# Patient Record
Sex: Female | Born: 1960 | Race: Black or African American | Hispanic: No | Marital: Married | Smoking: Former smoker
Health system: Southern US, Community
[De-identification: ages and names within clinical notes are randomized; demographics above are authoritative.]

## PROBLEM LIST (undated history)

## (undated) DIAGNOSIS — N200 Calculus of kidney: Secondary | ICD-10-CM

## (undated) DIAGNOSIS — E785 Hyperlipidemia, unspecified: Secondary | ICD-10-CM

## (undated) DIAGNOSIS — R011 Cardiac murmur, unspecified: Secondary | ICD-10-CM

## (undated) DIAGNOSIS — J189 Pneumonia, unspecified organism: Secondary | ICD-10-CM

## (undated) DIAGNOSIS — I10 Essential (primary) hypertension: Secondary | ICD-10-CM

## (undated) DIAGNOSIS — I499 Cardiac arrhythmia, unspecified: Secondary | ICD-10-CM

## (undated) HISTORY — PX: COLONOSCOPY: SHX174

## (undated) HISTORY — DX: Cardiac murmur, unspecified: R01.1

## (undated) HISTORY — PX: FOOT SURGERY: SHX648

## (undated) HISTORY — PX: BACK SURGERY: SHX140

## (undated) HISTORY — DX: Hyperlipidemia, unspecified: E78.5

## (undated) HISTORY — DX: Essential (primary) hypertension: I10

---

## 1998-07-07 ENCOUNTER — Emergency Department (HOSPITAL_COMMUNITY): Admission: EM | Admit: 1998-07-07 | Discharge: 1998-07-07 | Payer: Self-pay | Admitting: Emergency Medicine

## 1999-09-26 HISTORY — PX: ABDOMINAL HYSTERECTOMY: SHX81

## 2000-06-27 ENCOUNTER — Emergency Department (HOSPITAL_COMMUNITY): Admission: EM | Admit: 2000-06-27 | Discharge: 2000-06-27 | Payer: Self-pay | Admitting: Emergency Medicine

## 2001-02-10 ENCOUNTER — Encounter: Payer: Self-pay | Admitting: Emergency Medicine

## 2001-02-10 ENCOUNTER — Emergency Department (HOSPITAL_COMMUNITY): Admission: EM | Admit: 2001-02-10 | Discharge: 2001-02-10 | Payer: Self-pay | Admitting: Emergency Medicine

## 2001-02-15 ENCOUNTER — Other Ambulatory Visit: Admission: RE | Admit: 2001-02-15 | Discharge: 2001-02-15 | Payer: Self-pay | Admitting: Obstetrics and Gynecology

## 2001-03-13 ENCOUNTER — Encounter (INDEPENDENT_AMBULATORY_CARE_PROVIDER_SITE_OTHER): Payer: Self-pay | Admitting: Specialist

## 2001-03-13 ENCOUNTER — Other Ambulatory Visit: Admission: RE | Admit: 2001-03-13 | Discharge: 2001-03-13 | Payer: Self-pay | Admitting: Obstetrics and Gynecology

## 2001-05-07 ENCOUNTER — Encounter (INDEPENDENT_AMBULATORY_CARE_PROVIDER_SITE_OTHER): Payer: Self-pay

## 2001-05-07 ENCOUNTER — Observation Stay (HOSPITAL_COMMUNITY): Admission: RE | Admit: 2001-05-07 | Discharge: 2001-05-08 | Payer: Self-pay | Admitting: Obstetrics and Gynecology

## 2002-10-22 ENCOUNTER — Emergency Department (HOSPITAL_COMMUNITY): Admission: EM | Admit: 2002-10-22 | Discharge: 2002-10-22 | Payer: Self-pay | Admitting: Emergency Medicine

## 2002-10-23 ENCOUNTER — Ambulatory Visit (HOSPITAL_COMMUNITY): Admission: RE | Admit: 2002-10-23 | Discharge: 2002-10-23 | Payer: Self-pay | Admitting: Surgery

## 2002-10-23 ENCOUNTER — Encounter: Payer: Self-pay | Admitting: Surgery

## 2003-04-08 ENCOUNTER — Ambulatory Visit (HOSPITAL_COMMUNITY): Admission: RE | Admit: 2003-04-08 | Discharge: 2003-04-08 | Payer: Self-pay | Admitting: Obstetrics and Gynecology

## 2003-04-08 ENCOUNTER — Encounter: Payer: Self-pay | Admitting: Obstetrics and Gynecology

## 2005-01-20 ENCOUNTER — Emergency Department (HOSPITAL_COMMUNITY): Admission: EM | Admit: 2005-01-20 | Discharge: 2005-01-20 | Payer: Self-pay | Admitting: Emergency Medicine

## 2005-01-25 ENCOUNTER — Emergency Department (HOSPITAL_COMMUNITY): Admission: EM | Admit: 2005-01-25 | Discharge: 2005-01-25 | Payer: Self-pay | Admitting: Emergency Medicine

## 2005-02-13 ENCOUNTER — Ambulatory Visit: Payer: Self-pay | Admitting: Internal Medicine

## 2005-02-27 ENCOUNTER — Ambulatory Visit: Payer: Self-pay | Admitting: Internal Medicine

## 2005-03-10 ENCOUNTER — Ambulatory Visit: Payer: Self-pay | Admitting: Internal Medicine

## 2005-03-16 ENCOUNTER — Ambulatory Visit: Payer: Self-pay | Admitting: Internal Medicine

## 2005-04-18 ENCOUNTER — Ambulatory Visit: Payer: Self-pay | Admitting: Internal Medicine

## 2005-05-09 ENCOUNTER — Ambulatory Visit: Payer: Self-pay | Admitting: Internal Medicine

## 2005-05-11 ENCOUNTER — Ambulatory Visit: Payer: Self-pay | Admitting: Internal Medicine

## 2005-05-16 ENCOUNTER — Ambulatory Visit: Payer: Self-pay | Admitting: Internal Medicine

## 2005-05-17 ENCOUNTER — Ambulatory Visit (HOSPITAL_COMMUNITY): Admission: RE | Admit: 2005-05-17 | Discharge: 2005-05-17 | Payer: Self-pay | Admitting: Internal Medicine

## 2005-05-30 ENCOUNTER — Ambulatory Visit: Payer: Self-pay | Admitting: Internal Medicine

## 2005-05-31 ENCOUNTER — Ambulatory Visit (HOSPITAL_COMMUNITY): Admission: RE | Admit: 2005-05-31 | Discharge: 2005-05-31 | Payer: Self-pay | Admitting: Internal Medicine

## 2005-07-10 ENCOUNTER — Emergency Department (HOSPITAL_COMMUNITY): Admission: EM | Admit: 2005-07-10 | Discharge: 2005-07-10 | Payer: Self-pay | Admitting: Emergency Medicine

## 2005-09-26 ENCOUNTER — Ambulatory Visit: Payer: Self-pay | Admitting: Internal Medicine

## 2005-09-28 ENCOUNTER — Ambulatory Visit (HOSPITAL_COMMUNITY): Admission: RE | Admit: 2005-09-28 | Discharge: 2005-09-28 | Payer: Self-pay | Admitting: Internal Medicine

## 2005-10-03 ENCOUNTER — Ambulatory Visit: Payer: Self-pay | Admitting: Hospitalist

## 2005-11-07 ENCOUNTER — Ambulatory Visit: Payer: Self-pay | Admitting: Internal Medicine

## 2005-11-12 ENCOUNTER — Emergency Department (HOSPITAL_COMMUNITY): Admission: EM | Admit: 2005-11-12 | Discharge: 2005-11-12 | Payer: Self-pay | Admitting: Emergency Medicine

## 2005-11-14 ENCOUNTER — Ambulatory Visit: Payer: Self-pay | Admitting: Internal Medicine

## 2005-11-15 ENCOUNTER — Ambulatory Visit: Payer: Self-pay | Admitting: Internal Medicine

## 2005-12-07 ENCOUNTER — Ambulatory Visit: Payer: Self-pay | Admitting: Internal Medicine

## 2005-12-14 ENCOUNTER — Ambulatory Visit (HOSPITAL_COMMUNITY): Admission: RE | Admit: 2005-12-14 | Discharge: 2005-12-14 | Payer: Self-pay | Admitting: Hospitalist

## 2005-12-14 ENCOUNTER — Ambulatory Visit: Payer: Self-pay | Admitting: Internal Medicine

## 2006-01-04 ENCOUNTER — Ambulatory Visit: Payer: Self-pay

## 2006-01-05 ENCOUNTER — Ambulatory Visit: Payer: Self-pay | Admitting: Internal Medicine

## 2006-01-05 ENCOUNTER — Inpatient Hospital Stay (HOSPITAL_COMMUNITY): Admission: EM | Admit: 2006-01-05 | Discharge: 2006-01-08 | Payer: Self-pay | Admitting: Internal Medicine

## 2006-01-05 ENCOUNTER — Ambulatory Visit: Payer: Self-pay | Admitting: Cardiology

## 2006-01-05 ENCOUNTER — Ambulatory Visit (HOSPITAL_COMMUNITY): Admission: RE | Admit: 2006-01-05 | Discharge: 2006-01-05 | Payer: Self-pay | Admitting: Internal Medicine

## 2006-01-08 ENCOUNTER — Encounter: Payer: Self-pay | Admitting: Cardiology

## 2006-01-09 ENCOUNTER — Ambulatory Visit: Payer: Self-pay

## 2006-01-16 ENCOUNTER — Ambulatory Visit: Payer: Self-pay | Admitting: Internal Medicine

## 2006-01-25 ENCOUNTER — Ambulatory Visit (HOSPITAL_COMMUNITY): Admission: RE | Admit: 2006-01-25 | Discharge: 2006-01-25 | Payer: Self-pay | Admitting: Internal Medicine

## 2006-02-14 ENCOUNTER — Emergency Department (HOSPITAL_COMMUNITY): Admission: EM | Admit: 2006-02-14 | Discharge: 2006-02-14 | Payer: Self-pay | Admitting: Family Medicine

## 2006-03-06 ENCOUNTER — Ambulatory Visit: Payer: Self-pay | Admitting: Internal Medicine

## 2006-03-12 ENCOUNTER — Ambulatory Visit (HOSPITAL_COMMUNITY): Admission: RE | Admit: 2006-03-12 | Discharge: 2006-03-12 | Payer: Self-pay | Admitting: Internal Medicine

## 2006-03-20 ENCOUNTER — Ambulatory Visit: Payer: Self-pay | Admitting: Internal Medicine

## 2006-04-18 ENCOUNTER — Ambulatory Visit: Payer: Self-pay | Admitting: Gastroenterology

## 2006-05-09 ENCOUNTER — Ambulatory Visit: Payer: Self-pay | Admitting: Internal Medicine

## 2006-05-16 ENCOUNTER — Ambulatory Visit: Payer: Self-pay | Admitting: Gastroenterology

## 2006-05-21 ENCOUNTER — Ambulatory Visit: Payer: Self-pay | Admitting: Gastroenterology

## 2006-06-11 ENCOUNTER — Ambulatory Visit: Payer: Self-pay | Admitting: Gastroenterology

## 2006-06-11 ENCOUNTER — Encounter (INDEPENDENT_AMBULATORY_CARE_PROVIDER_SITE_OTHER): Payer: Self-pay | Admitting: Ophthalmology

## 2006-06-27 ENCOUNTER — Emergency Department (HOSPITAL_COMMUNITY): Admission: EM | Admit: 2006-06-27 | Discharge: 2006-06-28 | Payer: Self-pay | Admitting: Emergency Medicine

## 2006-06-29 ENCOUNTER — Ambulatory Visit: Payer: Self-pay | Admitting: Internal Medicine

## 2006-08-03 ENCOUNTER — Ambulatory Visit: Payer: Self-pay | Admitting: Internal Medicine

## 2006-10-31 ENCOUNTER — Emergency Department (HOSPITAL_COMMUNITY): Admission: EM | Admit: 2006-10-31 | Discharge: 2006-10-31 | Payer: Self-pay | Admitting: Family Medicine

## 2006-11-08 ENCOUNTER — Telehealth (INDEPENDENT_AMBULATORY_CARE_PROVIDER_SITE_OTHER): Payer: Self-pay | Admitting: *Deleted

## 2006-11-26 ENCOUNTER — Telehealth: Payer: Self-pay | Admitting: *Deleted

## 2006-11-27 ENCOUNTER — Ambulatory Visit: Payer: Self-pay | Admitting: Hospitalist

## 2006-11-27 ENCOUNTER — Encounter (INDEPENDENT_AMBULATORY_CARE_PROVIDER_SITE_OTHER): Payer: Self-pay | Admitting: Ophthalmology

## 2006-11-27 ENCOUNTER — Ambulatory Visit (HOSPITAL_COMMUNITY): Admission: RE | Admit: 2006-11-27 | Discharge: 2006-11-27 | Payer: Self-pay | Admitting: Hospitalist

## 2006-11-27 DIAGNOSIS — R059 Cough, unspecified: Secondary | ICD-10-CM | POA: Insufficient documentation

## 2006-11-27 DIAGNOSIS — R05 Cough: Secondary | ICD-10-CM

## 2006-11-27 DIAGNOSIS — I10 Essential (primary) hypertension: Secondary | ICD-10-CM | POA: Insufficient documentation

## 2006-11-27 DIAGNOSIS — E785 Hyperlipidemia, unspecified: Secondary | ICD-10-CM | POA: Insufficient documentation

## 2006-11-27 LAB — CONVERTED CEMR LAB
Blood Glucose, Fingerstick: 109
Hgb A1c MFr Bld: 6.6 %

## 2006-12-14 ENCOUNTER — Telehealth (INDEPENDENT_AMBULATORY_CARE_PROVIDER_SITE_OTHER): Payer: Self-pay | Admitting: Ophthalmology

## 2006-12-17 ENCOUNTER — Telehealth (INDEPENDENT_AMBULATORY_CARE_PROVIDER_SITE_OTHER): Payer: Self-pay | Admitting: *Deleted

## 2006-12-27 ENCOUNTER — Telehealth: Payer: Self-pay | Admitting: *Deleted

## 2007-01-04 ENCOUNTER — Telehealth: Payer: Self-pay | Admitting: *Deleted

## 2007-01-10 ENCOUNTER — Encounter (INDEPENDENT_AMBULATORY_CARE_PROVIDER_SITE_OTHER): Payer: Self-pay | Admitting: Ophthalmology

## 2007-01-11 ENCOUNTER — Ambulatory Visit: Payer: Self-pay | Admitting: Internal Medicine

## 2007-01-11 ENCOUNTER — Encounter (INDEPENDENT_AMBULATORY_CARE_PROVIDER_SITE_OTHER): Payer: Self-pay | Admitting: *Deleted

## 2007-01-11 LAB — CONVERTED CEMR LAB
BUN: 12 mg/dL (ref 6–23)
CO2: 24 meq/L (ref 19–32)
Calcium: 8.8 mg/dL (ref 8.4–10.5)
Chloride: 103 meq/L (ref 96–112)
Creatinine, Ser: 0.85 mg/dL (ref 0.40–1.20)
Glucose, Bld: 253 mg/dL — ABNORMAL HIGH (ref 70–99)
Potassium: 4.4 meq/L (ref 3.5–5.3)
Sodium: 136 meq/L (ref 135–145)

## 2007-02-05 ENCOUNTER — Ambulatory Visit: Payer: Self-pay | Admitting: Internal Medicine

## 2007-02-26 ENCOUNTER — Telehealth (INDEPENDENT_AMBULATORY_CARE_PROVIDER_SITE_OTHER): Payer: Self-pay | Admitting: *Deleted

## 2007-02-26 ENCOUNTER — Ambulatory Visit: Payer: Self-pay | Admitting: Internal Medicine

## 2007-02-26 ENCOUNTER — Encounter (INDEPENDENT_AMBULATORY_CARE_PROVIDER_SITE_OTHER): Payer: Self-pay | Admitting: *Deleted

## 2007-02-26 ENCOUNTER — Telehealth: Payer: Self-pay | Admitting: *Deleted

## 2007-02-26 LAB — CONVERTED CEMR LAB
BUN: 10 mg/dL (ref 6–23)
Blood Glucose, Fingerstick: 380
CO2: 26 meq/L (ref 19–32)
Calcium: 10.1 mg/dL (ref 8.4–10.5)
Chloride: 99 meq/L (ref 96–112)
Creatinine, Ser: 0.9 mg/dL (ref 0.40–1.20)
Glucose, Bld: 393 mg/dL — ABNORMAL HIGH (ref 70–99)
Hgb A1c MFr Bld: 8 %
Potassium: 4.1 meq/L (ref 3.5–5.3)
Sodium: 137 meq/L (ref 135–145)

## 2007-03-07 ENCOUNTER — Ambulatory Visit: Payer: Self-pay | Admitting: Internal Medicine

## 2007-03-07 ENCOUNTER — Encounter (INDEPENDENT_AMBULATORY_CARE_PROVIDER_SITE_OTHER): Payer: Self-pay | Admitting: Internal Medicine

## 2007-03-07 LAB — CONVERTED CEMR LAB: Blood Glucose, Fingerstick: 229

## 2007-03-13 ENCOUNTER — Ambulatory Visit (HOSPITAL_COMMUNITY): Admission: RE | Admit: 2007-03-13 | Discharge: 2007-03-13 | Payer: Self-pay | Admitting: Internal Medicine

## 2007-03-18 ENCOUNTER — Emergency Department (HOSPITAL_COMMUNITY): Admission: EM | Admit: 2007-03-18 | Discharge: 2007-03-18 | Payer: Self-pay | Admitting: Emergency Medicine

## 2007-03-27 ENCOUNTER — Telehealth (INDEPENDENT_AMBULATORY_CARE_PROVIDER_SITE_OTHER): Payer: Self-pay | Admitting: *Deleted

## 2007-04-29 ENCOUNTER — Telehealth (INDEPENDENT_AMBULATORY_CARE_PROVIDER_SITE_OTHER): Payer: Self-pay | Admitting: Internal Medicine

## 2007-05-06 ENCOUNTER — Telehealth (INDEPENDENT_AMBULATORY_CARE_PROVIDER_SITE_OTHER): Payer: Self-pay | Admitting: Internal Medicine

## 2007-05-07 ENCOUNTER — Telehealth: Payer: Self-pay | Admitting: *Deleted

## 2007-05-14 ENCOUNTER — Ambulatory Visit: Payer: Self-pay | Admitting: Internal Medicine

## 2007-05-14 LAB — CONVERTED CEMR LAB
Blood Glucose, Fingerstick: 135
Hgb A1c MFr Bld: 7.7 %

## 2007-06-03 ENCOUNTER — Telehealth: Payer: Self-pay | Admitting: *Deleted

## 2007-07-22 ENCOUNTER — Emergency Department (HOSPITAL_COMMUNITY): Admission: EM | Admit: 2007-07-22 | Discharge: 2007-07-22 | Payer: Self-pay | Admitting: Emergency Medicine

## 2007-08-15 ENCOUNTER — Ambulatory Visit: Payer: Self-pay | Admitting: Internal Medicine

## 2007-08-15 ENCOUNTER — Encounter (INDEPENDENT_AMBULATORY_CARE_PROVIDER_SITE_OTHER): Payer: Self-pay | Admitting: Internal Medicine

## 2007-08-15 DIAGNOSIS — R42 Dizziness and giddiness: Secondary | ICD-10-CM | POA: Insufficient documentation

## 2007-08-15 LAB — CONVERTED CEMR LAB
Bilirubin Urine: NEGATIVE
Glucose, Urine, Semiquant: NEGATIVE
Ketones, urine, test strip: NEGATIVE
Nitrite: NEGATIVE
Protein, U semiquant: 30
Specific Gravity, Urine: 1.02
Urobilinogen, UA: 0.2
WBC Urine, dipstick: NEGATIVE
pH: 6.5

## 2007-08-16 LAB — CONVERTED CEMR LAB
Amphetamine Screen, Ur: NEGATIVE
Barbiturate Quant, Ur: NEGATIVE
Benzodiazepines.: NEGATIVE
Cocaine Metabolites: NEGATIVE
Creatinine, Urine: 165 mg/dL
Creatinine,U: 165.3 mg/dL
Marijuana Metabolite: NEGATIVE
Methadone: NEGATIVE
Microalb Creat Ratio: 56.1 mg/g — ABNORMAL HIGH (ref 0.0–30.0)
Microalb, Ur: 9.25 mg/dL — ABNORMAL HIGH (ref 0.00–1.89)
Opiates: NEGATIVE
Phencyclidine (PCP): NEGATIVE
Propoxyphene: NEGATIVE

## 2007-08-20 ENCOUNTER — Telehealth: Payer: Self-pay | Admitting: *Deleted

## 2007-09-09 ENCOUNTER — Telehealth (INDEPENDENT_AMBULATORY_CARE_PROVIDER_SITE_OTHER): Payer: Self-pay | Admitting: *Deleted

## 2007-10-03 ENCOUNTER — Ambulatory Visit: Payer: Self-pay | Admitting: Internal Medicine

## 2007-10-03 ENCOUNTER — Encounter (INDEPENDENT_AMBULATORY_CARE_PROVIDER_SITE_OTHER): Payer: Self-pay | Admitting: Internal Medicine

## 2007-10-04 LAB — CONVERTED CEMR LAB
ALT: 25 units/L (ref 0–35)
AST: 25 units/L (ref 0–37)
Albumin: 4.4 g/dL (ref 3.5–5.2)
Alkaline Phosphatase: 99 units/L (ref 39–117)
BUN: 11 mg/dL (ref 6–23)
CO2: 24 meq/L (ref 19–32)
Calcium: 9.5 mg/dL (ref 8.4–10.5)
Chloride: 105 meq/L (ref 96–112)
Cholesterol: 135 mg/dL (ref 0–200)
Creatinine, Ser: 0.81 mg/dL (ref 0.40–1.20)
Glucose, Bld: 133 mg/dL — ABNORMAL HIGH (ref 70–99)
HDL: 38 mg/dL — ABNORMAL LOW (ref >39)
LDL Cholesterol: 75 mg/dL (ref 0–99)
Potassium: 3.9 meq/L (ref 3.5–5.3)
Sodium: 141 meq/L (ref 135–145)
Total Bilirubin: 0.5 mg/dL (ref 0.3–1.2)
Total CHOL/HDL Ratio: 3.6
Total Protein: 7.7 g/dL (ref 6.0–8.3)
Triglycerides: 111 mg/dL (ref <150)
VLDL: 22 mg/dL (ref 0–40)

## 2007-10-10 ENCOUNTER — Encounter (INDEPENDENT_AMBULATORY_CARE_PROVIDER_SITE_OTHER): Payer: Self-pay | Admitting: Internal Medicine

## 2007-10-11 ENCOUNTER — Encounter (INDEPENDENT_AMBULATORY_CARE_PROVIDER_SITE_OTHER): Payer: Self-pay | Admitting: Internal Medicine

## 2007-11-20 ENCOUNTER — Ambulatory Visit: Payer: Self-pay | Admitting: Internal Medicine

## 2007-11-20 ENCOUNTER — Encounter (INDEPENDENT_AMBULATORY_CARE_PROVIDER_SITE_OTHER): Payer: Self-pay | Admitting: Infectious Diseases

## 2007-11-20 DIAGNOSIS — R609 Edema, unspecified: Secondary | ICD-10-CM | POA: Insufficient documentation

## 2007-11-20 LAB — CONVERTED CEMR LAB
ALT: 36 units/L — ABNORMAL HIGH (ref 0–35)
AST: 30 units/L (ref 0–37)
Albumin: 4.3 g/dL (ref 3.5–5.2)
Alkaline Phosphatase: 97 units/L (ref 39–117)
BUN: 9 mg/dL (ref 6–23)
Blood Glucose, Fingerstick: 315
CO2: 25 meq/L (ref 19–32)
Calcium: 9.4 mg/dL (ref 8.4–10.5)
Chloride: 102 meq/L (ref 96–112)
Creatinine, Ser: 0.73 mg/dL (ref 0.40–1.20)
Glucose, Bld: 257 mg/dL — ABNORMAL HIGH (ref 70–99)
Hgb A1c MFr Bld: 7.6 %
Potassium: 4 meq/L (ref 3.5–5.3)
Sodium: 137 meq/L (ref 135–145)
TSH: 2.003 microintl units/mL (ref 0.350–5.50)
Total Bilirubin: 0.4 mg/dL (ref 0.3–1.2)
Total Protein: 7.6 g/dL (ref 6.0–8.3)

## 2007-11-22 ENCOUNTER — Telehealth: Payer: Self-pay | Admitting: *Deleted

## 2007-11-22 LAB — CONVERTED CEMR LAB
ALT: 44 units/L — ABNORMAL HIGH (ref 0–35)
AST: 36 units/L (ref 0–37)
Albumin: 3.8 g/dL (ref 3.5–5.2)
Alkaline Phosphatase: 96 units/L (ref 39–117)
BUN: 8 mg/dL (ref 6–23)
Bilirubin Urine: NEGATIVE
CO2: 28 meq/L (ref 19–32)
Calcium: 9.4 mg/dL (ref 8.4–10.5)
Chloride: 102 meq/L (ref 96–112)
Creatinine, Ser: 0.8 mg/dL (ref 0.40–1.20)
Glucose, Bld: 265 mg/dL — ABNORMAL HIGH (ref 70–99)
Ketones, ur: NEGATIVE mg/dL
Leukocytes, UA: NEGATIVE
Nitrite: NEGATIVE
Potassium: 3.9 meq/L (ref 3.5–5.3)
Pro B Natriuretic peptide (BNP): <30 pg/mL (ref 0.0–100.0)
Protein, ur: 30 mg/dL — AB
Sodium: 137 meq/L (ref 135–145)
Specific Gravity, Urine: 1.027 (ref 1.005–1.03)
Total Bilirubin: 0.6 mg/dL (ref 0.3–1.2)
Total Protein: 7.4 g/dL (ref 6.0–8.3)
Urine Glucose: >1000 mg/dL — AB
Urobilinogen, UA: 0.2 (ref 0.0–1.0)
pH: 5.5 (ref 5.0–8.0)

## 2007-12-05 ENCOUNTER — Telehealth (INDEPENDENT_AMBULATORY_CARE_PROVIDER_SITE_OTHER): Payer: Self-pay | Admitting: Internal Medicine

## 2007-12-09 ENCOUNTER — Telehealth (INDEPENDENT_AMBULATORY_CARE_PROVIDER_SITE_OTHER): Payer: Self-pay | Admitting: *Deleted

## 2007-12-11 ENCOUNTER — Ambulatory Visit: Payer: Self-pay | Admitting: Hospitalist

## 2007-12-11 ENCOUNTER — Encounter (INDEPENDENT_AMBULATORY_CARE_PROVIDER_SITE_OTHER): Payer: Self-pay | Admitting: Internal Medicine

## 2007-12-11 LAB — CONVERTED CEMR LAB
BUN: 7 mg/dL (ref 6–23)
Bilirubin Urine: NEGATIVE
Bilirubin Urine: NEGATIVE
Blood Glucose, Fingerstick: 438
Blood Glucose, Home Monitor: 1 mg/dL
CO2: 24 meq/L (ref 19–32)
Calcium: 9.5 mg/dL (ref 8.4–10.5)
Chloride: 100 meq/L (ref 96–112)
Creatinine, Ser: 0.79 mg/dL (ref 0.40–1.20)
Glucose, Bld: 404 mg/dL — ABNORMAL HIGH (ref 70–99)
Glucose, Urine, Semiquant: >=1000
Hemoglobin, Urine: NEGATIVE
Ketones, ur: NEGATIVE mg/dL
Ketones, urine, test strip: NEGATIVE
Leukocytes, UA: NEGATIVE
Nitrite: NEGATIVE
Nitrite: NEGATIVE
Potassium: 3.8 meq/L (ref 3.5–5.3)
Protein, ur: NEGATIVE mg/dL
Sodium: 133 meq/L — ABNORMAL LOW (ref 135–145)
Specific Gravity, Urine: 1.01
Specific Gravity, Urine: 1.034 — ABNORMAL HIGH (ref 1.005–1.03)
Urine Glucose: >1000 mg/dL — AB
Urobilinogen, UA: 0.2
Urobilinogen, UA: 0.2 (ref 0.0–1.0)
WBC Urine, dipstick: NEGATIVE
pH: 5
pH: 5.5 (ref 5.0–8.0)

## 2007-12-27 ENCOUNTER — Ambulatory Visit: Payer: Self-pay | Admitting: Internal Medicine

## 2007-12-27 ENCOUNTER — Encounter (INDEPENDENT_AMBULATORY_CARE_PROVIDER_SITE_OTHER): Payer: Self-pay | Admitting: Internal Medicine

## 2007-12-27 LAB — CONVERTED CEMR LAB: Blood Glucose, Fingerstick: 207

## 2007-12-30 LAB — CONVERTED CEMR LAB
Creatinine, Urine: 115.7 mg/dL
Microalb Creat Ratio: 205.7 mg/g — ABNORMAL HIGH (ref 0.0–30.0)
Microalb, Ur: 23.8 mg/dL — ABNORMAL HIGH (ref 0.00–1.89)

## 2008-01-19 ENCOUNTER — Emergency Department (HOSPITAL_COMMUNITY): Admission: EM | Admit: 2008-01-19 | Discharge: 2008-01-20 | Payer: Self-pay | Admitting: Emergency Medicine

## 2008-01-23 ENCOUNTER — Encounter (INDEPENDENT_AMBULATORY_CARE_PROVIDER_SITE_OTHER): Payer: Self-pay | Admitting: Internal Medicine

## 2008-01-23 ENCOUNTER — Ambulatory Visit: Payer: Self-pay | Admitting: Internal Medicine

## 2008-01-23 DIAGNOSIS — IMO0001 Reserved for inherently not codable concepts without codable children: Secondary | ICD-10-CM | POA: Insufficient documentation

## 2008-01-23 LAB — CONVERTED CEMR LAB: Blood Glucose, Fingerstick: 218

## 2008-03-10 ENCOUNTER — Telehealth (INDEPENDENT_AMBULATORY_CARE_PROVIDER_SITE_OTHER): Payer: Self-pay | Admitting: Internal Medicine

## 2008-05-25 ENCOUNTER — Telehealth (INDEPENDENT_AMBULATORY_CARE_PROVIDER_SITE_OTHER): Payer: Self-pay | Admitting: Internal Medicine

## 2008-05-25 ENCOUNTER — Emergency Department (HOSPITAL_COMMUNITY): Admission: EM | Admit: 2008-05-25 | Discharge: 2008-05-25 | Payer: Self-pay | Admitting: Family Medicine

## 2008-05-28 ENCOUNTER — Encounter (INDEPENDENT_AMBULATORY_CARE_PROVIDER_SITE_OTHER): Payer: Self-pay | Admitting: Internal Medicine

## 2008-05-28 ENCOUNTER — Ambulatory Visit: Payer: Self-pay | Admitting: Internal Medicine

## 2008-05-28 LAB — CONVERTED CEMR LAB
Blood Glucose, Fingerstick: 216
Hgb A1c MFr Bld: 7.3 %

## 2008-05-29 LAB — CONVERTED CEMR LAB
Cholesterol: 151 mg/dL (ref 0–200)
HDL: 43 mg/dL (ref >39)
LDL Cholesterol: 87 mg/dL (ref 0–99)
Total CHOL/HDL Ratio: 3.5
Triglycerides: 105 mg/dL (ref <150)
VLDL: 21 mg/dL (ref 0–40)

## 2008-06-11 ENCOUNTER — Ambulatory Visit: Payer: Self-pay | Admitting: Internal Medicine

## 2008-06-11 LAB — CONVERTED CEMR LAB: Blood Glucose, Fingerstick: 189

## 2008-06-25 ENCOUNTER — Ambulatory Visit (HOSPITAL_COMMUNITY): Admission: RE | Admit: 2008-06-25 | Discharge: 2008-06-25 | Payer: Self-pay | Admitting: Internal Medicine

## 2008-06-30 ENCOUNTER — Encounter (INDEPENDENT_AMBULATORY_CARE_PROVIDER_SITE_OTHER): Payer: Self-pay | Admitting: Internal Medicine

## 2008-07-03 ENCOUNTER — Encounter (INDEPENDENT_AMBULATORY_CARE_PROVIDER_SITE_OTHER): Payer: Self-pay | Admitting: Internal Medicine

## 2008-07-03 ENCOUNTER — Encounter: Admission: RE | Admit: 2008-07-03 | Discharge: 2008-07-03 | Payer: Self-pay | Admitting: Internal Medicine

## 2008-07-03 LAB — HM MAMMOGRAPHY

## 2008-07-07 ENCOUNTER — Ambulatory Visit: Payer: Self-pay | Admitting: Infectious Disease

## 2008-07-07 ENCOUNTER — Encounter: Payer: Self-pay | Admitting: Internal Medicine

## 2008-07-07 DIAGNOSIS — N39490 Overflow incontinence: Secondary | ICD-10-CM | POA: Insufficient documentation

## 2008-07-07 LAB — CONVERTED CEMR LAB
Bacteria, UA: NONE SEEN
Bilirubin Urine: NEGATIVE
Blood Glucose, Fingerstick: 137
Hemoglobin, Urine: NEGATIVE
Ketones, ur: NEGATIVE mg/dL
Leukocytes, UA: NEGATIVE
Nitrite: NEGATIVE
Protein, ur: 30 mg/dL — AB
Specific Gravity, Urine: 1.014 (ref 1.005–1.03)
Urine Glucose: NEGATIVE mg/dL
Urobilinogen, UA: 1 (ref 0.0–1.0)
pH: 6 (ref 5.0–8.0)

## 2008-07-17 ENCOUNTER — Ambulatory Visit (HOSPITAL_COMMUNITY): Admission: RE | Admit: 2008-07-17 | Discharge: 2008-07-17 | Payer: Self-pay | Admitting: Internal Medicine

## 2008-07-20 ENCOUNTER — Telehealth (INDEPENDENT_AMBULATORY_CARE_PROVIDER_SITE_OTHER): Payer: Self-pay | Admitting: Internal Medicine

## 2008-07-21 ENCOUNTER — Encounter: Payer: Self-pay | Admitting: Internal Medicine

## 2008-07-30 ENCOUNTER — Telehealth (INDEPENDENT_AMBULATORY_CARE_PROVIDER_SITE_OTHER): Payer: Self-pay | Admitting: Pharmacy Technician

## 2008-08-19 ENCOUNTER — Ambulatory Visit: Payer: Self-pay | Admitting: Infectious Diseases

## 2008-08-19 LAB — CONVERTED CEMR LAB: Hgb A1c MFr Bld: 6.8 %

## 2008-08-31 ENCOUNTER — Encounter (INDEPENDENT_AMBULATORY_CARE_PROVIDER_SITE_OTHER): Payer: Self-pay | Admitting: Internal Medicine

## 2008-10-30 ENCOUNTER — Encounter (INDEPENDENT_AMBULATORY_CARE_PROVIDER_SITE_OTHER): Payer: Self-pay | Admitting: Internal Medicine

## 2008-11-11 ENCOUNTER — Encounter (INDEPENDENT_AMBULATORY_CARE_PROVIDER_SITE_OTHER): Payer: Self-pay | Admitting: Internal Medicine

## 2008-12-02 ENCOUNTER — Telehealth (INDEPENDENT_AMBULATORY_CARE_PROVIDER_SITE_OTHER): Payer: Self-pay | Admitting: Internal Medicine

## 2008-12-10 ENCOUNTER — Encounter (INDEPENDENT_AMBULATORY_CARE_PROVIDER_SITE_OTHER): Payer: Self-pay | Admitting: Internal Medicine

## 2008-12-10 ENCOUNTER — Ambulatory Visit: Payer: Self-pay | Admitting: *Deleted

## 2008-12-10 DIAGNOSIS — R1031 Right lower quadrant pain: Secondary | ICD-10-CM | POA: Insufficient documentation

## 2008-12-10 LAB — CONVERTED CEMR LAB
ALT: 12 units/L (ref 0–35)
AST: 18 units/L (ref 0–37)
Albumin: 4.6 g/dL (ref 3.5–5.2)
Alkaline Phosphatase: 141 units/L — ABNORMAL HIGH (ref 39–117)
BUN: 11 mg/dL (ref 6–23)
Bilirubin Urine: NEGATIVE
Blood Glucose, Fingerstick: 103
CO2: 24 meq/L (ref 19–32)
Calcium: 9.8 mg/dL (ref 8.4–10.5)
Chloride: 105 meq/L (ref 96–112)
Creatinine, Ser: 0.81 mg/dL (ref 0.40–1.20)
Glucose, Bld: 85 mg/dL (ref 70–99)
Hemoglobin, Urine: NEGATIVE
Hgb A1c MFr Bld: 7 %
Ketones, ur: NEGATIVE mg/dL
Leukocytes, UA: NEGATIVE
Nitrite: NEGATIVE
Potassium: 3.8 meq/L (ref 3.5–5.3)
Protein, ur: 30 mg/dL — AB
RBC / HPF: NONE SEEN (ref <3)
Sodium: 142 meq/L (ref 135–145)
Specific Gravity, Urine: 1.017 (ref 1.005–1.030)
Total Bilirubin: 0.6 mg/dL (ref 0.3–1.2)
Total Protein: 7.9 g/dL (ref 6.0–8.3)
Urine Glucose: NEGATIVE mg/dL
Urobilinogen, UA: 1 (ref 0.0–1.0)
pH: 7 (ref 5.0–8.0)

## 2008-12-11 ENCOUNTER — Ambulatory Visit (HOSPITAL_COMMUNITY): Admission: RE | Admit: 2008-12-11 | Discharge: 2008-12-11 | Payer: Self-pay | Admitting: Internal Medicine

## 2008-12-11 ENCOUNTER — Telehealth (INDEPENDENT_AMBULATORY_CARE_PROVIDER_SITE_OTHER): Payer: Self-pay | Admitting: Internal Medicine

## 2008-12-16 ENCOUNTER — Ambulatory Visit (HOSPITAL_COMMUNITY): Admission: RE | Admit: 2008-12-16 | Discharge: 2008-12-16 | Payer: Self-pay | Admitting: Internal Medicine

## 2008-12-28 ENCOUNTER — Telehealth (INDEPENDENT_AMBULATORY_CARE_PROVIDER_SITE_OTHER): Payer: Self-pay | Admitting: Internal Medicine

## 2008-12-28 ENCOUNTER — Ambulatory Visit: Payer: Self-pay | Admitting: Internal Medicine

## 2008-12-28 ENCOUNTER — Encounter (INDEPENDENT_AMBULATORY_CARE_PROVIDER_SITE_OTHER): Payer: Self-pay | Admitting: Internal Medicine

## 2008-12-31 LAB — CONVERTED CEMR LAB
Creatinine, Urine: 182.3 mg/dL
Microalb Creat Ratio: 158.7 mg/g — ABNORMAL HIGH (ref 0.0–30.0)
Microalb, Ur: 28.93 mg/dL — ABNORMAL HIGH (ref 0.00–1.89)

## 2009-01-15 ENCOUNTER — Encounter (INDEPENDENT_AMBULATORY_CARE_PROVIDER_SITE_OTHER): Payer: Self-pay | Admitting: Internal Medicine

## 2009-01-15 ENCOUNTER — Ambulatory Visit: Payer: Self-pay | Admitting: Internal Medicine

## 2009-01-15 LAB — CONVERTED CEMR LAB
ALT: 11 units/L (ref 0–35)
AST: 14 units/L (ref 0–37)
Albumin: 4 g/dL (ref 3.5–5.2)
Alkaline Phosphatase: 142 units/L — ABNORMAL HIGH (ref 39–117)
BUN: 11 mg/dL (ref 6–23)
CO2: 23 meq/L (ref 19–32)
Calcium: 9.2 mg/dL (ref 8.4–10.5)
Chloride: 104 meq/L (ref 96–112)
Cholesterol: 136 mg/dL (ref 0–200)
Creatinine, Ser: 0.78 mg/dL (ref 0.40–1.20)
GFR calc Af Amer: >60 mL/min (ref >60)
GFR calc non Af Amer: >60 mL/min (ref >60)
Glucose, Bld: 171 mg/dL — ABNORMAL HIGH (ref 70–99)
HDL: 41 mg/dL (ref >39)
LDL Cholesterol: 83 mg/dL (ref 0–99)
Potassium: 4.1 meq/L (ref 3.5–5.3)
Sodium: 140 meq/L (ref 135–145)
Total Bilirubin: 0.5 mg/dL (ref 0.3–1.2)
Total CHOL/HDL Ratio: 3.3
Total Protein: 7.2 g/dL (ref 6.0–8.3)
Triglycerides: 61 mg/dL (ref <150)
VLDL: 12 mg/dL (ref 0–40)

## 2009-03-08 ENCOUNTER — Telehealth (INDEPENDENT_AMBULATORY_CARE_PROVIDER_SITE_OTHER): Payer: Self-pay | Admitting: Internal Medicine

## 2009-03-17 ENCOUNTER — Telehealth: Payer: Self-pay | Admitting: *Deleted

## 2009-04-01 ENCOUNTER — Ambulatory Visit: Payer: Self-pay | Admitting: Internal Medicine

## 2009-04-01 ENCOUNTER — Encounter: Payer: Self-pay | Admitting: Internal Medicine

## 2009-04-01 DIAGNOSIS — M545 Low back pain, unspecified: Secondary | ICD-10-CM | POA: Insufficient documentation

## 2009-04-01 LAB — CONVERTED CEMR LAB
ALT: 14 units/L (ref 0–35)
AST: 16 units/L (ref 0–37)
Albumin: 4.3 g/dL (ref 3.5–5.2)
Alkaline Phosphatase: 144 units/L — ABNORMAL HIGH (ref 39–117)
BUN: 11 mg/dL (ref 6–23)
CO2: 24 meq/L (ref 19–32)
Calcium: 10.4 mg/dL (ref 8.4–10.5)
Chloride: 102 meq/L (ref 96–112)
Creatinine, Ser: 0.83 mg/dL (ref 0.40–1.20)
Glucose, Bld: 282 mg/dL — ABNORMAL HIGH (ref 70–99)
Hgb A1c MFr Bld: 9.2 %
Potassium: 3.9 meq/L (ref 3.5–5.3)
Sodium: 140 meq/L (ref 135–145)
Total Bilirubin: 0.5 mg/dL (ref 0.3–1.2)
Total Protein: 7.5 g/dL (ref 6.0–8.3)

## 2009-04-20 ENCOUNTER — Telehealth: Payer: Self-pay | Admitting: Internal Medicine

## 2009-06-07 ENCOUNTER — Telehealth: Payer: Self-pay | Admitting: Internal Medicine

## 2009-07-12 LAB — HM DIABETES FOOT EXAM

## 2009-07-15 ENCOUNTER — Telehealth: Payer: Self-pay | Admitting: Internal Medicine

## 2009-07-21 ENCOUNTER — Telehealth: Payer: Self-pay | Admitting: *Deleted

## 2009-07-22 ENCOUNTER — Ambulatory Visit: Payer: Self-pay | Admitting: Internal Medicine

## 2009-07-22 LAB — CONVERTED CEMR LAB
Blood Glucose, Fingerstick: 136
Hgb A1c MFr Bld: 8.2 %

## 2009-08-16 ENCOUNTER — Telehealth (INDEPENDENT_AMBULATORY_CARE_PROVIDER_SITE_OTHER): Payer: Self-pay | Admitting: *Deleted

## 2009-09-06 ENCOUNTER — Telehealth: Payer: Self-pay | Admitting: Internal Medicine

## 2009-10-28 ENCOUNTER — Telehealth: Payer: Self-pay | Admitting: Internal Medicine

## 2009-11-24 ENCOUNTER — Emergency Department (HOSPITAL_COMMUNITY): Admission: EM | Admit: 2009-11-24 | Discharge: 2009-11-24 | Payer: Self-pay | Admitting: Emergency Medicine

## 2009-12-22 ENCOUNTER — Telehealth: Payer: Self-pay | Admitting: Internal Medicine

## 2009-12-31 ENCOUNTER — Telehealth: Payer: Self-pay | Admitting: Internal Medicine

## 2010-01-05 ENCOUNTER — Ambulatory Visit: Payer: Self-pay | Admitting: Internal Medicine

## 2010-01-05 ENCOUNTER — Telehealth: Payer: Self-pay | Admitting: Internal Medicine

## 2010-01-05 DIAGNOSIS — J029 Acute pharyngitis, unspecified: Secondary | ICD-10-CM | POA: Insufficient documentation

## 2010-01-21 ENCOUNTER — Telehealth: Payer: Self-pay | Admitting: *Deleted

## 2010-02-28 ENCOUNTER — Telehealth: Payer: Self-pay | Admitting: Internal Medicine

## 2010-03-30 ENCOUNTER — Encounter: Payer: Self-pay | Admitting: Internal Medicine

## 2010-04-04 ENCOUNTER — Telehealth: Payer: Self-pay | Admitting: Internal Medicine

## 2010-06-20 ENCOUNTER — Encounter: Admission: RE | Admit: 2010-06-20 | Discharge: 2010-06-20 | Payer: Self-pay | Admitting: Internal Medicine

## 2010-08-03 ENCOUNTER — Telehealth: Payer: Self-pay | Admitting: Internal Medicine

## 2010-08-23 ENCOUNTER — Telehealth: Payer: Self-pay | Admitting: Internal Medicine

## 2010-10-25 NOTE — Miscellaneous (Signed)
[place]   Imported By: Margie Billet 10/10/2007 14:16:45  _____________________________________________________________________  External Attachment:    Type:   Image     Comment:   External Document

## 2010-10-25 NOTE — Progress Notes (Signed)
Summary: refill/ks  Phone Note Refill Request Message from:  Pharmacy on May 06, 2007 10:39 AM  Refills Requested: Medication #1:  JANUVIA 100 MG TABS Take 1 tablet by mouth once a day   Last Refilled: 04/04/2007 Initial call taken by: Ballard Russell RN,  May 06, 2007 10:39 AM  Follow-up for Phone Call        Have pt pick medication on pharmacy Follow-up by: [name],  May 06, 2007 11:17 AM      Prescriptions: JANUVIA 100 MG TABS (SITAGLIPTIN PHOSPHATE) Take 1 tablet by mouth once a day  #31 x 0   Entered and Authorized by:   [name]   Signed by:   [name] on 05/06/2007   Method used:   Print then Give to Patient   RxID:   [id number]

## 2010-10-25 NOTE — Assessment & Plan Note (Signed)
Summary: f/u urg care, bp med change, appt per dr ortiz/pcp-ortiz/hla   Vital Signs:  Patient Profile:   50 Years Old Female Height:     62.5 inches (158.75 cm) Weight:      177.2 pounds (80.55 kg) BMI:     32.01 Temp:     97.9 degrees F (36.61 degrees C) oral Pulse rate:   74 / minute BP sitting:   156 / 100  (right arm)  Pt. in pain?   no  Vitals Entered By: Filomena Jungling NT II (May 28, 2008 8:43 AM)              Nutritional Status BMI of 25 - 29 = overweight CBG Result 216  Have you ever been in a relationship where you felt threatened, hurt or afraid?No   Does patient need assistance? Functional Status Self care Ambulation Normal     PCP:  [name]  Chief Complaint:  URGENT CARE FOLLOW-UP.  History of Present Illness: 50 yo F who comes in after being seen in Urgent Care for hypertension.  She has not had her BP under control for several weeks now and got concerned when she saw it was 190s/100s.  She checks it at home.  She called the [facility], but was unable to come until after work so was advised to go to the [facility].  When she was seen there, it was noted that her BP was 182/114 with a HR of 79.  She was given a RX for Toprol XL 100mg  daily and was told to stop taking the Metoprolol.  She was also given a RX for Norvasc 10mg  daily and was told to stop taking the Lisinopril 2/2 a cough.  She has a productive cough daily that she has had ever since she had pneumonia in April.  She doesn't remember if she had the cough in March when she was started on Lisinopril.  Cough is worse at night, but it can happen anytime.  Always productive of a lot of phlegm.  Has a hx of seasonal allergies.  She gets red, watery eyes with the cough.  She feels congested at times.  Because she has this cough, she was advised at the [facility] to stop taking the Lisinopril. Denies any F/C, CP, N/V/D/C, abd pain, acid reflux sx.    Updated Prior Medication List: METFORMIN HCL 1000 MG TABS  (METFORMIN HCL) Take 1 tablet by mouth two times a day LORATADINE 10 MG  TABS (LORATADINE) Take 1 tablet by mouth once a day GLIPIZIDE 10 MG  TABS (GLIPIZIDE) Take 1 tablet by mouth once a day PRAVASTATIN SODIUM 40 MG  TABS (PRAVASTATIN SODIUM) Take 1 tablet by mouth at bedtime TOPROL XL 100 MG XR24H-TAB (METOPROLOL SUCCINATE) Take 1 tablet by mouth once a day LISINOPRIL 20 MG  TABS (LISINOPRIL) Take 1 tablet by mouth once a day LANTUS SOLOSTAR 100 UNIT/ML  SOLN (INSULIN GLARGINE) Inject 24 units into the skin of your abdomen once every night BD U/F SHORT PEN NEEDLE 31G X 8 MM  MISC (INSULIN PEN NEEDLE) Use to inject your insulin FREESTYLE TEST   STRP (GLUCOSE BLOOD) Use as directed. AMLODIPINE BESYLATE 10 MG TABS (AMLODIPINE BESYLATE) Take 1 tablet by mouth once a day  Current Allergies (reviewed today): ! HYDROCHLOROTHIAZIDE  Past Medical History:    Reviewed history from 50/08/2007 and no changes required:       Diabetes mellitus, type II       Hyperlipidemia  Hypertension       Seasonal allergies  Past Surgical History:    Reviewed history from 50/30/2009 and no changes required:       Hysterectomy 2001-? but thinks fibroids.   Social History:    Reviewed history from 50/30/2009 and no changes required:       No smoking, no etoh, or illegal drugs.  Married, works  driving a school bus.   Risk Factors: Tobacco use:  quit    Year quit:  2006 Alcohol use:  no Exercise:  no Seatbelt use:  100 %  Family History Risk Factors:    Family History of MI in females < 19 years old:  no    Family History of MI in males < 49 years old:  no   Review of Systems      See HPI   Physical Exam  General:     Well-developed,well-nourished,in no acute distress; alert,appropriate and cooperative throughout examination Eyes:     Injected sclera,+ lacrimation B/L Lungs:     normal respiratory effort, no accessory muscle use, normal breath sounds, no crackles, and no wheezes.     Heart:     normal rate, regular rhythm, and no murmur.   Pulses:     R radial normal and L radial normal.   Extremities:     No edema Neurologic:     alert & oriented X3.   Skin:     Intact without suspicious lesions or rashes Psych:     Appropriate    Impression & Recommendations:  Problem # 1:  HYPERTENSION (ICD-401.9) BP not at goal, however has significantly improved since being seen in the [facility] three days ago.  I do not think the pts cough is coming from the ACE-I.  The cough is productive and is associated with allergy sx.  Advised the pt to stop taking her Claritin and start high dose Allegra for her allergies.  Additionally, have asked the pt to resume the Lisinopril for the next 2 weeks.  If the cough is the same or better, it's not from the ACE-I.  If it's worse, would D/C the ACE-I and put her on an ARB for the renal protection she needs due to her DM.  She will continue on Toprol and Norvasc.  When she RTC in 2 weeks for a BP check, a BMET should also be checked for her Cr and K since she will have restarted her ACE-I.  Cost is an issue for this pt, therefore when possible this should be taken into consideration.  Her updated medication list for this problem includes:    Toprol Xl 100 Mg Xr24h-tab (Metoprolol succinate) .Marland Kitchen... Take 1 tablet by mouth once a day    Lisinopril 20 Mg Tabs (Lisinopril) .Marland Kitchen... Take 1 tablet by mouth once a day    Amlodipine Besylate 10 Mg Tabs (Amlodipine besylate) .Marland Kitchen... Take 1 tablet by mouth once a day  BP today: 156/100 Prior BP: 148/91 (01/23/2008)  Labs Reviewed: Creat: 0.79 (12/11/2007) Chol: 135 (10/03/2007)   HDL: 38 (10/03/2007)   LDL: 75 (10/03/2007)   TG: 111 (10/03/2007)   Problem # 2:  DIABETES MELLITUS, TYPE II (ICD-250.00) A1c nearing goal (goal <7%).  The pt was advised to bring in a log of her sugars filled out to her next visit.  It may be considered to increase her Glipizide to 10mg  two times a day at that time, or to  increase her Lantus depending on when her highs are occuring.  Her updated medication list for this problem includes:    Metformin Hcl 1000 Mg Tabs (Metformin hcl) .Marland Kitchen... Take 1 tablet by mouth two times a day    Glipizide 10 Mg Tabs (Glipizide) .Marland Kitchen... Take 1 tablet by mouth once a day    Lisinopril 20 Mg Tabs (Lisinopril) .Marland Kitchen... Take 1 tablet by mouth once a day    Lantus Solostar 100 Unit/ml Soln (Insulin glargine) ..... Inject 24 units into the skin of your abdomen once every night  Orders: T-Hgb A1C (in-house) (16010XN) T- Capillary Blood Glucose (23557)  Labs Reviewed: HgBA1c: 7.3 (05/28/2008)   Creat: 0.79 (12/11/2007)   Microalbumin: 23.80 (12/27/2007)   Problem # 3:  HYPERLIPIDEMIA (ICD-272.4) LDL not at goal (goal <70).  Have changed her Pravastatin back to Lipitor at 80mg  daily.  She will need a CMET and FLP in 6 weeks.  Her updated medication list for this problem includes:    Lipitor 80 Mg Tabs (Atorvastatin calcium) .Marland Kitchen... Take 1 tablet by mouth once a day for your cholesterol  Orders: T-Lipid Profile ([address]-54270)  Labs Reviewed: Chol: 135 (10/03/2007)   HDL: 38 (10/03/2007)   LDL: 75 (10/03/2007)   TG: 111 (10/03/2007) SGOT: 30 (11/20/2007)   SGPT: 36 (11/20/2007)   Complete Medication List: 1)  Metformin Hcl 1000 Mg Tabs (Metformin hcl) .... Take 1 tablet by mouth two times a day 2)  Glipizide 10 Mg Tabs (Glipizide) .... Take 1 tablet by mouth once a day 3)  Lipitor 80 Mg Tabs (Atorvastatin calcium) .... Take 1 tablet by mouth once a day for your cholesterol 4)  Toprol Xl 100 Mg Xr24h-tab (Metoprolol succinate) .... Take 1 tablet by mouth once a day 5)  Lisinopril 20 Mg Tabs (Lisinopril) .... Take 1 tablet by mouth once a day 6)  Lantus Solostar 100 Unit/ml Soln (Insulin glargine) .... Inject 24 units into the skin of your abdomen once every night 7)  Bd U/f Short Pen Needle 31g X 8 Mm Misc (Insulin pen needle) .... Use to inject your insulin 8)  Freestyle Test Strp  (Glucose blood) .... Use as directed. 9)  Amlodipine Besylate 10 Mg Tabs (Amlodipine besylate) .... Take 1 tablet by mouth once a day 10)  Fexofenadine Hcl 180 Mg Tabs (Fexofenadine hcl) .... Take 1 tablet by mouth once a day for allergies   Patient Instructions: 1)  Please schedule a follow-up appointment in 2 weeks for a blood pressure check and diabetes check. 2)  Restart the Lisinopril 20mg  daily. 3)  Stop taking the Loratadine and start taking the Allegra (Fexofenadine) 180mg  daily for your cough. 4)  Use the diabetes log and check your sugars twice a day according to the log and bring it with you to your next visit. 5)  Stop the Metoprolol and start taking the Toprol at bedtime.   6)  Continue the Amlodipine (Norvasc).   Prescriptions: LIPITOR 80 MG TABS (ATORVASTATIN CALCIUM) Take 1 tablet by mouth once a day for your cholesterol  #30 x 11   Entered and Authorized by:   Chauncey Reading DO   Signed by:   Chauncey Reading DO on 05/29/2008   Method used:   Electronically to        [facility]* (retail)       [address]       [place], [place]  [address]       Ph: [phone]       Fax: [phone]   RxID:   [id number] FEXOFENADINE HCL 180  MG TABS (FEXOFENADINE HCL) Take 1 tablet by mouth once a day for allergies  #30 x 0   Entered and Authorized by:   Chauncey Reading DO   Signed by:   Chauncey Reading DO on 05/28/2008   Method used:   Electronically to        [facility]* (retail)       [address]       [place], [place]  [address]       Ph: [phone]       Fax: [phone]   RxID:   [id number]  ] Laboratory Results   Blood Tests   Date/Time Received: May 28, 2008 9:51 AM  Date/Time Reported: Oren Beckmann  May 28, 2008 9:51 AM   HGBA1C: 7.3%   (Normal Range: Non-Diabetic - 3-6%   Control Diabetic - 6-8%) CBG Random:: 216mg /dL

## 2010-10-25 NOTE — Progress Notes (Signed)
Summary: refill/ hla  Phone Note Refill Request Message from:  Patient on April 20, 2009 2:40 PM  Refills Requested: Medication #1:  GLIPIZIDE 5 MG TABS Take 1 tablet by mouth once in the morning Initial call taken by: Marin Roberts RN,  April 20, 2009 2:40 PM  Follow-up for Phone Call        completed Follow-up by: Mliss Sax MD,  April 21, 2009 12:40 PM    Prescriptions: GLIPIZIDE 5 MG TABS (GLIPIZIDE) Take 1 tablet by mouth once in the morning  #30 x 3   Entered and Authorized by:   Mliss Sax MD   Signed by:   Mliss Sax MD on 04/21/2009   Method used:   Electronically to        [facility] [phone]* (retail)       [address]       [place], [place]  [address]       Ph: [phone]       Fax: [phone]   RxID:   [id number]

## 2010-10-25 NOTE — Progress Notes (Signed)
Summary: refill/ hla  Phone Note Refill Request Message from:  Fax from Pharmacy on December 02, 2008 11:57 AM  Refills Requested: Medication #1:  METFORMIN HCL 1000 MG TABS Take 1 tablet by mouth two times a day   Last Refilled: 2/10 Initial call taken by: Marin Roberts RN,  December 02, 2008 11:59 AM      Prescriptions: METFORMIN HCL 1000 MG TABS (METFORMIN HCL) Take 1 tablet by mouth two times a day  #62 x 11   Entered and Authorized by:   [name]   Signed by:   [name] on 12/02/2008   Method used:   Electronically to        [facility] [phone]* (retail)       [address]       [place], [place]  [address]       Ph: [phone]       Fax: [phone]   RxID:   [id number]

## 2010-10-25 NOTE — Progress Notes (Signed)
Summary: phone/gg  Phone Note Call from Patient   Caller: Patient Summary of Call: Pt called c/o elevated cbg x 1week. running about 195 and having frequency.  Appointment offered for 3/17 but pt can't come in until 3/18. I gave her Lupita Leash Riley's number to call in the AM for futher advise. Initial call taken by: Merrie Roof RN,  December 09, 2007 5:08 PM  Follow-up for Phone Call        Left message for pt on 12/10/07. She is here today. Will follow-up/assist per physicain and patient request.  Follow-up by: [name],  December 11, 2007 11:39 AM

## 2010-10-25 NOTE — Progress Notes (Signed)
Summary: Refill/gh  Phone Note Refill Request Message from:  Fax from Pharmacy on July 15, 2009 10:07 AM  Refills Requested: Medication #1:  PRAVACHOL 40 MG TABS Take 2 tablet by mouth once a day   Last Refilled: 06/10/2009  Medication #2:  TOPROL XL 100 MG XR24H-TAB Take 1 tablet by mouth once a day   Last Refilled: 06/11/2009  Method Requested: Electronic Initial call taken by: Angelina Ok RN,  July 15, 2009 10:08 AM  Follow-up for Phone Call        completed Follow-up by: Mliss Sax MD,  July 15, 2009 12:51 PM    Prescriptions: TOPROL XL 100 MG XR24H-TAB (METOPROLOL SUCCINATE) Take 1 tablet by mouth once a day  #31 x 11   Entered and Authorized by:   Mliss Sax MD   Signed by:   Mliss Sax MD on 07/15/2009   Method used:   Electronically to        [facility] [phone]* (retail)       [address]       [place], [place]  [address]       Ph: [phone]       Fax: [phone]   RxID:   [id number] PRAVACHOL 40 MG TABS (PRAVASTATIN SODIUM) Take 2 tablet by mouth once a day  #62 x 11   Entered and Authorized by:   Mliss Sax MD   Signed by:   Mliss Sax MD on 07/15/2009   Method used:   Electronically to        [facility] [phone]* (retail)       [address]       [place], [place]  [address]       Ph: [phone]       Fax: [phone]   RxID:   [id number]

## 2010-10-25 NOTE — Miscellaneous (Signed)
Summary: [facility]: Diabetes Testing  [facility]: Diabetes Testing   Imported By: Florinda Marker 11/03/2008 15:01:17  _____________________________________________________________________  External Attachment:    Type:   Image     Comment:   External Document

## 2010-10-25 NOTE — Progress Notes (Signed)
Summary: refill/ hla  Phone Note Refill Request Message from:  Fax from Pharmacy on January 04, 2007 11:11 AM  Refills Requested: Medication #1:  JANUVIA 100 MG TABS Take 1 tablet by mouth once a day Initial call taken by: Marin Roberts RN,  January 04, 2007 11:11 AM  Follow-up for Phone Call        Refill approved-nurse to complete Follow-up by: Duncan Dull MD,  January 04, 2007 12:11 PM  Additional Follow-up for Phone Call Additional follow up Details #1::        Rx faxed to pharmacy Additional Follow-up by: Marin Roberts RN,  January 04, 2007 1:46 PM    Prescriptions: JANUVIA 100 MG TABS (SITAGLIPTIN PHOSPHATE) Take 1 tablet by mouth once a day  #31 x 2   Entered and Authorized by:   Duncan Dull MD   Signed by:   Duncan Dull MD on 01/04/2007   Method used:   Telephoned to ...       [facility]       [address]       [place], [place]  [address]  [place]       Ph: [phone]       Fax: [phone]   RxID:   [id number]

## 2010-10-25 NOTE — Progress Notes (Signed)
Summary: Prescription for Januvia  Phone Note Call from Patient   Caller: Patient Call For: [name] Summary of Call: Call from pt has not gotten refill on her Januvia.  Call to pharmacy refill for Januvia 100 mg 1 by mouth #31 with no refills called to pt's pharmacy. Called to [facility].  Pt was called and informd that prescription had been called in. ..................................................................Marland KitchenAngelina Ok RN  May 07, 2007 10:12 AM

## 2010-10-25 NOTE — Assessment & Plan Note (Signed)
Summary: FU OV/EST/VS   Vital Signs:  Patient profile:   50 year old female Height:      62.5 inches (158.75 cm) Weight:      175.9 pounds (79.95 kg) BMI:     31.77 Temp:     97.1 degrees F (36.17 degrees C) Pulse rate:   75 / minute BP sitting:   112 / 76  (left arm) Cuff size:   large  Vitals Entered By: Theotis Barrio NT II (December 28, 2008 2:56 PM) CC: MEDICATION REFILL   / BACK PAIN ONLY WIHEN SHE MOVES A CERTAIN WAY(BUT AT PRESENT) Is Patient Diabetic? Yes  Pain Assessment Patient in pain? no      Nutritional Status BMI of > 30 = obese  Have you ever been in a relationship where you felt threatened, hurt or afraid?NoO   Does patient need assistance? Functional Status Self care Ambulation Normal Comments MEDICATION REFILL /  BACK PAIN NOT AT PRESENT(WHEN SHE MOVES A CERTAIN WAY)   Primary Care Haile Bosler:  [name]  CC:  MEDICATION REFILL   / BACK PAIN ONLY WIHEN SHE MOVES A CERTAIN WAY(BUT AT PRESENT).  History of Present Illness: 50 y/o with PMH of HTN and DM comes in for a regular check up she relates is having low blood glucose at night. She relate no fever or chills. She relates urinating more than usual, no dysuria, no vaginal itching or discharge. no incontinence. denies anorexia, N/V, diarhea or change in bowel habit. She denies melena or BRBPR.  Preventive Screening-Counseling & Management     Smoking Status: quit     Year Quit: 2006     Does Patient Exercise: no     Times/week: 0  Current Medications (verified): 1)  Metformin Hcl 1000 Mg Tabs (Metformin Hcl) .... Take 1 Tablet By Mouth Two Times A Day 2)  Glipizide 10 Mg  Tabs (Glipizide) .... Take 1 Tablet By Mouth Once A Day 3)  Pravachol 40 Mg Tabs (Pravastatin Sodium) .... Take 2 Tablet By Mouth Once A Day 4)  Toprol Xl 100 Mg Xr24h-Tab (Metoprolol Succinate) .... Take 1 Tablet By Mouth Once A Day 5)  Lantus Solostar 100 Unit/ml  Soln (Insulin Glargine) .... Inject 35 Units Into The  Skin of Your Abdomen Once Every Night 6)  Bd U/f Short Pen Needle 31g X 8 Mm  Misc (Insulin Pen Needle) .... Use To Inject Your Insulin 7)  Freestyle Test   Strp (Glucose Blood) .... Use As Directed. 8)  Amlodipine Besylate 10 Mg Tabs (Amlodipine Besylate) .... Take 1 Tablet By Mouth Once A Day 9)  Cozaar 50 Mg Tabs (Losartan Potassium) .... Take 1 Tablet By Mouth Once A Day 10)  Tylenol 8 Hour 650 Mg Cr-Tabs (Acetaminophen) .... Take 2 Tablet By Mouth Three Times A Day As Needed  Allergies (verified): 1)  ! Hydrochlorothiazide 2)  ! * Lisnopril  Review of Systems  The patient denies anorexia, fever, weight loss, vision loss, decreased hearing, hoarseness, syncope, dyspnea on exertion, peripheral edema, prolonged cough, headaches, abdominal pain, melena, hematochezia, hematuria, incontinence, genital sores, suspicious skin lesions, difficulty walking, depression, unusual weight change, abnormal bleeding, enlarged lymph nodes, and breast masses.    Physical Exam  General:  Well-developed,well-nourished,in no acute distress; alert,appropriate and cooperative throughout examination Lungs:  Normal respiratory effort, chest expands symmetrically. Lungs are clear to auscultation, no crackles or wheezes. Heart:  Normal rate and regular rhythm. S1 and S2 normal without gallop, murmur, click,  rub or other extra sounds. Abdomen:  Bowel sounds positive,abdomen soft and non-tender without masses, organomegaly or hernias noted. Neurologic:  alert & oriented X3, cranial nerves II-XII intact, strength normal in all extremities, sensation intact to light touch, and gait normal.     Impression & Recommendations:  Problem # 1:  HYPERTENSION (ICD-401.9) Excellent control, will make no changes. Her updated medication list for this problem includes:    Toprol Xl 100 Mg Xr24h-tab (Metoprolol succinate) .Marland Kitchen... Take 1 tablet by mouth once a day    Amlodipine Besylate 10 Mg Tabs (Amlodipine besylate) .Marland Kitchen... Take  1 tablet by mouth once a day    Cozaar 50 Mg Tabs (Losartan potassium) .Marland Kitchen... Take 1 tablet by mouth once a day  BP today: 112/76 Prior BP: 113/75 (12/10/2008)  Labs Reviewed: K+: 3.8 (12/10/2008) Creat: : 0.81 (12/10/2008)   Chol: 151 (05/28/2008)   HDL: 43 (05/28/2008)   LDL: 87 (05/28/2008)   TG: 105 (05/28/2008)  Problem # 2:  DIABETES MELLITUS, TYPE II (ICD-250.00) Experiencing low during the night around 9:00pm.  she is taking her glipizide around 6:00pm. I have advise her to take it in the am and decrease it to 5 mg.  She is to bring her meter next time she comes in. I will seeher in 3 months. If she experience this low again she is to take a juice and it something solid, like cake or sandwich and make an appointment to the [facility] she is not take her glipizide. Her updated medication list for this problem includes:    Metformin Hcl 1000 Mg Tabs (Metformin hcl) .Marland Kitchen... Take 1 tablet by mouth two times a day    Glipizide 5 Mg Tabs (Glipizide) .Marland Kitchen... Take 1 tablet by mouth once in the morning    Lantus Solostar 100 Unit/ml Soln (Insulin glargine) ..... Inject 35 units into the skin of your abdomen once every night    Cozaar 50 Mg Tabs (Losartan potassium) .Marland Kitchen... Take 1 tablet by mouth once a day  Labs Reviewed: Creat: 0.81 (12/10/2008)    Reviewed HgBA1c results: 7.0 (12/10/2008)  6.8 (08/19/2008)  Orders: T-Urine Microalbumin w/creat. ratio [id number] / [id number]) Ophthalmology Referral (Ophthalmology)Future Orders: T-Lipid Profile [id number]) ... 01/12/2009  Problem # 3:  ABDOMINAL PAIN RIGHT LOWER QUADRANT (ICD-789.03) Will check anti- sacharomyces antobodies if negative will send to surgeon for evaluation for achalculos cholecystitis and will check a HIDA scan. Orders: T- * Misc. Laboratory test [id number])  Problem # 4:  HYPERLIPIDEMIA (ICD-272.4) Will check a FLP. Her updated medication list for this problem includes:    Pravachol 40 Mg Tabs (Pravastatin sodium) .Marland Kitchen... Take 2 tablet  by mouth once a day  Labs Reviewed: SGOT: 18 (12/10/2008)   SGPT: 12 (12/10/2008)   HDL:43 (05/28/2008), 38 (10/03/2007)  LDL:87 (05/28/2008), 75 (10/03/2007)  Chol:151 (05/28/2008), 135 (10/03/2007)  Trig:105 (05/28/2008), 111 (10/03/2007)  Complete Medication List: 1)  Metformin Hcl 1000 Mg Tabs (Metformin hcl) .... Take 1 tablet by mouth two times a day 2)  Glipizide 5 Mg Tabs (Glipizide) .... Take 1 tablet by mouth once in the morning 3)  Pravachol 40 Mg Tabs (Pravastatin sodium) .... Take 2 tablet by mouth once a day 4)  Toprol Xl 100 Mg Xr24h-tab (Metoprolol succinate) .... Take 1 tablet by mouth once a day 5)  Lantus Solostar 100 Unit/ml Soln (Insulin glargine) .... Inject 35 units into the skin of your abdomen once every night 6)  Bd U/f Short Pen Needle 31g X 8 Mm Misc (Insulin pen  needle) .... Use to inject your insulin 7)  Freestyle Test Strp (Glucose blood) .... Use as directed. 8)  Amlodipine Besylate 10 Mg Tabs (Amlodipine besylate) .... Take 1 tablet by mouth once a day 9)  Cozaar 50 Mg Tabs (Losartan potassium) .... Take 1 tablet by mouth once a day 10)  Tylenol 8 Hour 650 Mg Cr-tabs (Acetaminophen) .... Take 2 tablet by mouth three times a day as needed  Patient Instructions: 1)  Please schedule a follow-up appointment in 3 months. 2)  Check your blood sugars regularly. If your readings are usually above : 400 or below 70 you should contact our office. 3)  It is important that your Diabetic A1c level is checked every 3 months. 4)  See your eye doctor yearly to check for diabetic eye damage. 5)  Check your Blood Pressure regularly. If it is above: you should make an appointment. Prescriptions: LANTUS SOLOSTAR 100 UNIT/ML  SOLN (INSULIN GLARGINE) Inject 35 units into the skin of your abdomen once every night  #1 mo supply x 11   Entered and Authorized by:   [name]   Signed by:   [name] on 12/28/2008   Method used:   Print then Give to  Patient   RxID:   [id number]

## 2010-10-25 NOTE — Progress Notes (Signed)
  Phone Note Outgoing Call   Call placed by: Theotis Barrio,  November 22, 2007 12:08 PM Call placed to: Patient Details for Reason: TO LET PATIENT KNOW THAT HER LABS WERE O. Summary of Call: CALLED PATIENT THIS MORNING TO LET HER KNOW THAT HER LABS WERE OK, PER DR BHUSAL /. UNABLE TO TALK-LEAVE MESSAGE BECAUSE PHONE MESSAGE LINE IS FULL, UNABLE TO LEAVE MESSAGE. THEREFORE I'M SENDING A LETTER TO THE PATIENT TO GIVE [facility] A CALL BACK.

## 2010-10-25 NOTE — Progress Notes (Signed)
Summary: Refill/gh  Phone Note Refill Request Message from:  Fax from Pharmacy on August 16, 2009 4:09 PM  Refills Requested: Medication #1:  GLIPIZIDE 5 MG TABS Take 1 tablet by mouth once in the morning   Last Refilled: 07/14/2009  Method Requested: Electronic Initial call taken by: Angelina Ok RN,  August 16, 2009 4:09 PM    Prescriptions: GLIPIZIDE 5 MG TABS (GLIPIZIDE) Take 1 tablet by mouth once in the morning  #30 x 10   Entered and Authorized by:   Acey Lav MD   Signed by:   Acey Lav MD on 08/16/2009   Method used:   Electronically to        [facility] [phone]* (retail)       [address]       [place], [place]  [address]       Ph: [phone]       Fax: [phone]   RxID:   [id number]

## 2010-10-25 NOTE — Miscellaneous (Signed)
Summary: HIPAA Restrictions  HIPAA Restrictions   Imported By: Florinda Marker 05/28/2008 15:01:43  _____________________________________________________________________  External Attachment:    Type:   Image     Comment:   External Document

## 2010-10-25 NOTE — Assessment & Plan Note (Signed)
Summary: cold symptons/gg   Vital Signs:  Patient Profile:   50 Years Old Female Weight:      178.5 pounds (81.14 kg) O2 Sat:      100 % Temp:     97.1 degrees F (36.17 degrees C) oral Pulse rate:   78 / minute BP sitting:   115 / 82  (right arm)  Pt. in pain?   yes    Location:   chest    Intensity:   7  Vitals Entered By: Stanton Kidney Ditzler RN (Feb 05, 2007 11:17 AM) Oxygen therapy Room Air              Is Patient Diabetic? Yes  Nutritional Status Detail down  Have you ever been in a relationship where you felt threatened, hurt or afraid?denies   Does patient need assistance? Functional Status Self care Ambulation Normal   Chief Complaint:  Sneezing, nonproductive cough, SOB, head congestion, chest feels heavy, and eyes itch..  History of Present Illness: 51 y/o female PMH HTN and DM comes in for watery and icthy eyes for 1 week.  this sis acompanied by a runny nose,nasal congestion and a dry cough.  She also feels ther throat is itching.  She has gotten a new dog 1 month ago. She has no fever chills, no SOB or CP.  Current Allergies (reviewed today): ! * DOES NOT KNOW    Risk Factors: Tobacco use:  quit   Review of Systems  The patient denies anorexia, hoarseness, and dyspnea on exhertion.     Physical Exam  General:     Well-developed,well-nourished,in no acute distress; alert,appropriate and cooperative throughout examination Nose:     no external erythema and mucosal erythema.   Mouth:     Oral mucosa and oropharynx without lesions or exudates.  Teeth in good repair. Lungs:     Normal respiratory effort, chest expands symmetrically. Lungs are clear to auscultation, no crackles or wheezes.    Impression & Recommendations:  Problem # 1:  RHINITIS, ALLERGIC NOS (ICD-477.9) Pt just got a new dog. I have advise her that this could be tha cause of her allergies. I will start her on claritin daily. Her updated medication list for this problem includes:   Claritin 10 Mg Tabs (Loratadine) .Marland Kitchen... Take 1 tablet by mouth once a day   Medications Added to Medication List This Visit: 1)  Claritin 10 Mg Tabs (Loratadine) .... Take 1 tablet by mouth once a day   Patient Instructions: 1)  Please schedule an appointment with your primary doctor 1 month 2)  Taka claritin daily

## 2010-10-25 NOTE — Assessment & Plan Note (Signed)
Summary: routine visit/gg   Vital Signs:  Patient Profile:   50 Years Old Female Height:     62.5 inches (158.75 cm) Weight:      172.3 pounds (78.32 kg) BMI:     31.12 O2 Sat:      99 % O2 treatment:    Room Air Temp:     97.4 degrees F (36.33 degrees C) oral Pulse rate:   78 / minute BP sitting:   148 / 91  (right arm)  Pt. in pain?   no  Vitals Entered By: Stanton Kidney Ditzler RN (January 23, 2008 10:37 AM)              Is Patient Diabetic? Yes  Nutritional Status BMI of > 30 = obese Nutritional Status Detail fair - getting better CBG Result 218  Have you ever been in a relationship where you felt threatened, hurt or afraid?denies   Does patient need assistance? Functional Status Self care Ambulation Normal     PCP:  [name]  Chief Complaint:  ER FU and ck diabetic  reading.Marland Kitchen  History of Present Illness: Pt is a 50 yo female w/ PMhx below here for ER f/u from 4 days ago.  She had fevers(Temp of 104), myalgias, CP and a mild cough and was diagnosed w/ a lingular pna.  She is feeling significantly better now.  No fevers in the last two days(and no tylenol/ibuprofen), myalgias improved, and headaches are improved.  She has been taking azithromycin as prescribed and not requiring cough medication.    Updated Prior Medication List: METFORMIN HCL 1000 MG TABS (METFORMIN HCL) Take 1 tablet by mouth two times a day LORATADINE 10 MG  TABS (LORATADINE) Take 1 tablet by mouth once a day GLIPIZIDE 10 MG  TABS (GLIPIZIDE) Take 1 tablet by mouth once a day PRAVASTATIN SODIUM 40 MG  TABS (PRAVASTATIN SODIUM) Take 1 tablet by mouth at bedtime METOPROLOL TARTRATE 25 MG  TABS (METOPROLOL TARTRATE) Take one tablet by mouth twice a day MECLIZINE HCL 25 MG  CHEW (MECLIZINE HCL) Take 1 tablet by mouth two times a day LISINOPRIL 20 MG  TABS (LISINOPRIL) Take 1 tablet by mouth once a day LANTUS SOLOSTAR 100 UNIT/ML  SOLN (INSULIN GLARGINE) Inject 24 units into the skin of your  abdomen once every night BD U/F SHORT PEN NEEDLE 31G X 8 MM  MISC (INSULIN PEN NEEDLE) Use to inject your insulin FREESTYLE TEST   STRP (GLUCOSE BLOOD) Use as directed. ZITHROMAX Z-PAK 250 MG  TABS (AZITHROMYCIN)   Current Allergies (reviewed today): ! * DOES NOT KNOW ! HYDROCHLOROTHIAZIDE  Past Medical History:    Reviewed history from 03/07/2007 and no changes required:       Diabetes mellitus, type II       Hyperlipidemia       Hypertension       Seasonal allergies  Past Surgical History:    Reviewed history and no changes required:       Hysterectomy 2001-? but thinks fibroids.   Social History:    Reviewed history and no changes required:       No smoking, no etoh, or illegal drugs.  Married, works  driving a school bus.   Risk Factors: Tobacco use:  quit    Year quit:  2006 Alcohol use:  no Exercise:  no Seatbelt use:  100 %  Family History Risk Factors:    Family History of MI in females < 76 years old:  no    Family History of MI in males < 50 years old:  no   Review of Systems       As per HPI.   Physical Exam  General:     Alert, husband at side, well groomed, no distress. Ears:     R RM w/ cerumen impaction, L TM normal.   Mouth:     MMM, no oropharyngeal exudates. Neck:     No LAD. Lungs:     Normal respiratory effort, chest expands symmetrically. Lungs are clear to auscultation, no crackles or wheezes. Heart:     Normal rate and regular rhythm. S1 and S2 normal without gallop, murmur, click, rub or other extra sounds. Abdomen:     +BS's, soft, NT, ND. Extremities:     No peripheral edema.    Impression & Recommendations:  Problem # 1:  PNEUMONIA, ORGANISM UNSPECIFIED (ICD-486) Pt significantly improving.  Will continue full course of azithromycin and tx symptomatically.   Advised to return to clinic/ER if worsens.    Her updated medication list for this problem includes:    Zithromax Z-pak 250 Mg Tabs (Azithromycin)   Problem # 2:   DIABETES MELLITUS, TYPE II (ICD-250.00) Up to date on a1c, lipids, and urine microalb/cr ratio.  Will f/u in 6 weeks for well woman exam and repeat a1c.  Will get mammogram at next visit, since she will be due for one in June..  Her updated medication list for this problem includes:    Metformin Hcl 1000 Mg Tabs (Metformin hcl) .Marland Kitchen... Take 1 tablet by mouth two times a day    Glipizide 10 Mg Tabs (Glipizide) .Marland Kitchen... Take 1 tablet by mouth once a day    Lisinopril 20 Mg Tabs (Lisinopril) .Marland Kitchen... Take 1 tablet by mouth once a day    Lantus Solostar 100 Unit/ml Soln (Insulin glargine) ..... Inject 24 units into the skin of your abdomen once every night  Orders: Capillary Blood Glucose (82948) Fingerstick (16109)   Problem # 3:  HYPERTENSION (ICD-401.9) BP a little above goal in a diabetic pt, but given that she has been sick recently, will hold off on adjusting for now.  Will f/u w/ PCP in 6 weeks.  Her updated medication list for this problem includes:    Metoprolol Tartrate 25 Mg Tabs (Metoprolol tartrate) .Marland Kitchen... Take one tablet by mouth twice a day    Lisinopril 20 Mg Tabs (Lisinopril) .Marland Kitchen... Take 1 tablet by mouth once a day   Complete Medication List: 1)  Metformin Hcl 1000 Mg Tabs (Metformin hcl) .... Take 1 tablet by mouth two times a day 2)  Loratadine 10 Mg Tabs (Loratadine) .... Take 1 tablet by mouth once a day 3)  Glipizide 10 Mg Tabs (Glipizide) .... Take 1 tablet by mouth once a day 4)  Pravastatin Sodium 40 Mg Tabs (Pravastatin sodium) .... Take 1 tablet by mouth at bedtime 5)  Metoprolol Tartrate 25 Mg Tabs (Metoprolol tartrate) .... Take one tablet by mouth twice a day 6)  Meclizine Hcl 25 Mg Chew (Meclizine hcl) .... Take 1 tablet by mouth two times a day 7)  Lisinopril 20 Mg Tabs (Lisinopril) .... Take 1 tablet by mouth once a day 8)  Lantus Solostar 100 Unit/ml Soln (Insulin glargine) .... Inject 24 units into the skin of your abdomen once every night 9)  Bd U/f Short Pen  Needle 31g X 8 Mm Misc (Insulin pen needle) .... Use to inject your insulin 10)  Freestyle Test  Strp (Glucose blood) .... Use as directed. 11)  Zithromax Z-pak 250 Mg Tabs (Azithromycin)   Patient Instructions: 1)  Please make a followup appointment at the begining of June for a well woman exam. 2)  If you start to feel worse, please seek assistance. 3)  Please continue your antibiotics and drinking lots of fluids.    ]

## 2010-10-25 NOTE — Progress Notes (Signed)
Summary: med refill/wl  Phone Note Refill Request   Refills Requested: Medication #1:  glipizide 10mg  #60 Take one tablet twice daily   Last Refilled: 11/07/2006   Notes: Med not on preload medication list. Med is listed in paper chart with directions as 20mg  at bedtime.  Method Requested: Fax to Local Pharmacy Initial call taken by: Dorene Sorrow RN,  December 17, 2006 12:29 PM  Follow-up for Phone Call        Refill approved-nurse to complete Follow-up by: Manning Charity MD,  December 17, 2006 2:18 PM  Additional Follow-up for Phone Call Additional follow up Details #1::        Rx faxed to pharmacy Additional Follow-up by: Dorene Sorrow RN,  December 17, 2006 4:45 PM  New/Updated Medications: GLIPIZIDE 10 MG TABS (GLIPIZIDE) Take two tablets by mouth at night  New/Updated Medications: GLIPIZIDE 10 MG TABS (GLIPIZIDE) Take two tablets by mouth at night  Prescriptions: GLIPIZIDE 10 MG TABS (GLIPIZIDE) Take two tablets by mouth at night  #60 x 3   Entered and Authorized by:   Manning Charity MD   Signed by:   Manning Charity MD on 12/17/2006   Method used:   Telephoned to ...         RxID:   [id number]

## 2010-10-25 NOTE — Progress Notes (Signed)
Summary: Refill request/dms  Phone Note Refill Request Message from:  Fax from Pharmacy on February 26, 2007 8:28 AM  Refills Requested: Medication #1:  DIOVAN 160 MG TABS Take 1 tablet by mouth once a day   Last Refilled: 11/08/2006  Method Requested: Fax to Local Pharmacy Initial call taken by: Slappey Cloud,  February 26, 2007 8:29 AM  Follow-up for Phone Call        Refill approved-nurse to complete Follow-up by: Margarito Liner MD,  February 26, 2007 10:20 AM  Additional Follow-up for Phone Call Additional follow up Details #1::        Rx faxed to pharmacy Additional Follow-up by: Ruegg Cloud,  February 26, 2007 10:23 AM    Prescriptions: DIOVAN 160 MG TABS (VALSARTAN) Take 1 tablet by mouth once a day  #31 x 2   Entered and Authorized by:   Margarito Liner MD   Signed by:   Margarito Liner MD on 02/26/2007   Method used:   Telephoned to ...       [facility]       [address]       [place], [place]  [address]       Ph: [phone]       Fax: [phone]   RxID:   [id number]

## 2010-10-25 NOTE — Assessment & Plan Note (Signed)
Summary: EST-2 WEEK RECHECK FOR BP AND DM/CH   Vital Signs:  Patient Profile:   50 Years Old Female Height:     62.5 inches (158.75 cm) Weight:      177.06 pounds (80.48 kg) BMI:     31.98 Temp:     97.8 degrees F (36.56 degrees C) oral Pulse rate:   67 / minute BP sitting:   132 / 85  (right arm)  Pt. in pain?   no  Vitals Entered By: Angelina Ok RN (December 27, 2007 9:40 AM)              Is Patient Diabetic? Yes  Nutritional Status BMI of > 30 = obese CBG Result 207  Have you ever been in a relationship where you felt threatened, hurt or afraid?No   Does patient need assistance? Functional Status Self care Ambulation Normal     PCP:  [name]  Chief Complaint:  Check up.  History of Present Illness: 50 y/o with PMH of HTN, DM and HLP comes in  for a regular check up. She relates she feels good and no side effects from the medication.  She is due for her pap-smear but she would like to do it on the next visit.  She relates has been trying to diet and exercise.  Feels great and no troubles at home.    Prior Medication List:  METFORMIN HCL 1000 MG TABS (METFORMIN HCL) Take 1 tablet by mouth two times a day LORATADINE 10 MG  TABS (LORATADINE) Take 1 tablet by mouth once a day GLIPIZIDE 10 MG  TABS (GLIPIZIDE) Take 1 tablet by mouth once a day PRAVASTATIN SODIUM 40 MG  TABS (PRAVASTATIN SODIUM) Take 1 tablet by mouth at bedtime METOPROLOL TARTRATE 25 MG  TABS (METOPROLOL TARTRATE) Take one tablet by mouth twice a day MECLIZINE HCL 25 MG  CHEW (MECLIZINE HCL) Take 1 tablet by mouth two times a day LISINOPRIL 20 MG  TABS (LISINOPRIL) Take 1 tablet by mouth once a day LANTUS SOLOSTAR 100 UNIT/ML  SOLN (INSULIN GLARGINE) Inject 15 units into the skin of your abdomen once every night BD U/F SHORT PEN NEEDLE 31G X 8 MM  MISC (INSULIN PEN NEEDLE) Use to inject your insulin FREESTYLE TEST   STRP (GLUCOSE BLOOD) Use as directed.   Current Allergies: ! * DOES NOT  KNOW    Risk Factors: Tobacco use:  quit    Year quit:  2006 Alcohol use:  no Exercise:  no Seatbelt use:  100 %  Family History Risk Factors:    Family History of MI in females < 76 years old:  no    Family History of MI in males < 42 years old:  no   Review of Systems  The patient denies anorexia, weight loss, decreased hearing, chest pain, dyspnea on exhertion, prolonged cough, hemoptysis, and melena.     Physical Exam  General:     Well-developed,well-nourished,in no acute distress;  Lungs:     Lungs are clear to auscultation, no crackles or wheezes. Heart:     normal rate, regular rhythm, no murmur, no gallop, and no rub.  normal rate, regular rhythm, no murmur, no gallop, and no rub.      Impression & Recommendations:  Problem # 1:  HYPERTENSION (ICD-401.9) Assessment: Unchanged well control will not make any changes.  has been fluctating 138/82-118/70. Her updated medication list for this problem includes:    Metoprolol Tartrate 25 Mg Tabs (  Metoprolol tartrate) .Marland Kitchen... Take one tablet by mouth twice a day    Lisinopril 20 Mg Tabs (Lisinopril) .Marland Kitchen... Take 1 tablet by mouth once a day   Problem # 2:  DIABETES MELLITUS, TYPE II (ICD-250.00) Assessment: Unchanged Her HgA1c 7.7 last time. She was started on insulin since then.  I will go ahead and increase her insulin to 22 units at bedtime. I will see her next month for her pap smear.  At this time will check a HgA1c. Today will check a spot urine to see how her kidneys are doing. Last eye exam on november, pt is aware and will keep her appointment. Her updated medication list for this problem includes:    Metformin Hcl 1000 Mg Tabs (Metformin hcl) .Marland Kitchen... Take 1 tablet by mouth two times a day    Glipizide 10 Mg Tabs (Glipizide) .Marland Kitchen... Take 1 tablet by mouth once a day    Lisinopril 20 Mg Tabs (Lisinopril) .Marland Kitchen... Take 1 tablet by mouth once a day    Lantus Solostar 100 Unit/ml Soln (Insulin glargine) ..... Inject 22  units into the skin of your abdomen once every night  Orders: Capillary Blood Glucose (27253) Capillary Blood Glucose (82948) Fingerstick (36416) Fingerstick (66440) T-Urine Microalbumin w/creat. ratio [id number] / [id number])  Orders: Capillary Blood Glucose (43329) Capillary Blood Glucose (82948) Fingerstick (36416) Fingerstick (51884) T-Urine Microalbumin w/creat. ratio [id number] / [id number])   Problem # 3:  PREVENTIVE HEALTH CARE (ICD-V70.0) Pt last mamogramon june of last year no recordson the computer.  [facility] Pap-smear >1 yr ago. Pt having her period will scheduel and appointment for 2-3 weeks and will do it then.  Will also due a HbA1c then.  Complete Medication List: 1)  Metformin Hcl 1000 Mg Tabs (Metformin hcl) .... Take 1 tablet by mouth two times a day 2)  Loratadine 10 Mg Tabs (Loratadine) .... Take 1 tablet by mouth once a day 3)  Glipizide 10 Mg Tabs (Glipizide) .... Take 1 tablet by mouth once a day 4)  Pravastatin Sodium 40 Mg Tabs (Pravastatin sodium) .... Take 1 tablet by mouth at bedtime 5)  Metoprolol Tartrate 25 Mg Tabs (Metoprolol tartrate) .... Take one tablet by mouth twice a day 6)  Meclizine Hcl 25 Mg Chew (Meclizine hcl) .... Take 1 tablet by mouth two times a day 7)  Lisinopril 20 Mg Tabs (Lisinopril) .... Take 1 tablet by mouth once a day 8)  Lantus Solostar 100 Unit/ml Soln (Insulin glargine) .... Inject 22 units into the skin of your abdomen once every night 9)  Bd U/f Short Pen Needle 31g X 8 Mm Misc (Insulin pen needle) .... Use to inject your insulin 10)  Freestyle Test Strp (Glucose blood) .... Use as directed.   Patient Instructions: 1)  Check your blood sugars regularly. If your readings are usually above : or below 70 you should contact our office. 2)  It is important that your Diabetic A1c level is checked every 3 months. 3)  See your eye doctor yearly to check for diabetic eye damage. 4)  Check your feet each night  for sore areas, calluses or signs of infection. 5)  Check your Blood Pressure regularly. If it is above: you should make an appointment. 6)  Please schedule a follow-up appointment in 1 month. 7)  Remeber your pap-smear on next visit. 8)  It is important that you exercise regularly at least 20 minutes 5 times a week. If you develop chest pain, have  severe difficulty breathing, or feel very tired , stop exercising immediately and seek medical attention. 9)  You need to lose weight. Consider a lower calorie diet and regular exercise.  10)  You need to have a Pap Smear to prevent cervical cancer.    ]  Procedures Next Due Date:    Mammogram: 03/2008   Procedures Next Due Date:    Mammogram: 03/2008

## 2010-10-25 NOTE — Progress Notes (Signed)
Summary: med refill/gp  Phone Note Refill Request Message from:  Fax from Pharmacy on December 31, 2009 11:20 AM  Refills Requested: Medication #1:  LANTUS SOLOSTAR 100 UNIT/ML  SOLN Inject 35 units into the skin of your abdomen once every night   Last Refilled: 11/14/2009  Method Requested: Electronic Initial call taken by: Chinita Pester RN,  December 31, 2009 11:20 AM  Follow-up for Phone Call        completed  Follow-up by: Mliss Sax MD,  December 31, 2009 10:19 PM    Prescriptions: LANTUS SOLOSTAR 100 UNIT/ML  SOLN (INSULIN GLARGINE) Inject 35 units into the skin of your abdomen once every night  #1 x 3   Entered and Authorized by:   Mliss Sax MD   Signed by:   Mliss Sax MD on 12/31/2009   Method used:   Electronically to        [facility] [phone]* (retail)       [address]       [place], [place]  [address]       Ph: [phone]       Fax: [phone]   RxID:   [id number]

## 2010-10-25 NOTE — Assessment & Plan Note (Signed)
Summary: est-ck/fu/meds/cfb   Vital Signs:  Patient Profile:   50 Years Old Female Weight:      180.1 pounds (81.86 kg) O2 Sat:      98 % Temp:     97.7 degrees F (36.50 degrees C) oral Pulse rate:   74 / minute BP sitting:   111 / 78  (right arm)  Pt. in pain?   yes    Location:   right shoulder    Intensity:   9  Vitals Entered By: Stanton Kidney Ditzler RN (November 27, 2006 11:19 AM)              Is Patient Diabetic? Yes  Nutritional Status Normal Nutritional Status Detail fair CBG Result 109  Have you ever been in a relationship where you felt threatened, hurt or afraid?denies   Does patient need assistance? Functional Status Self care Ambulation Normal   Serial Vital Signs/Assessments:                                PEF    PreRx  PostRx Time      O2 Sat  O2 Type     L/min  L/min  L/min   By 12:49 PM  98  %   Room air                          Teresa Grant   Chief Complaint:  FU on bronchitis x 3 weeks- still working, heaviness in chest, back and right shoulder, and has clear to green productive cough. Right ear pops..  History of Present Illness: Teresa Grant is a 50 yo AA woman followed in clinic for DM, HTN, and hyperlipidemia among other things who presents with a cheif complaint of cough, nasal congestion, post-nasal drip, and chest "congestion" and "tightness."  Her story is:  she was evaluated at the [facility]  ~3 wks ago for cough, fever, myalgias, sorethroat and weakness - diagnosed w/ bronchitis, tx'd with azithromycin and histinex.  Her symptoms improved over the next 1.5-2 weeks.  However,  ~1 wk prior to today's visit she had recurrence of her symptoms: a cough that was productive of clear phlegm; nasal congestion and post-nasal drip; diffuse muscle aches.  She also reports that  ~1wk ago she had a 1 - 1.5 days of chest congestion and "tightness" - it was quite painful; located in the midline, substernally; made worse w/ deep inspiration.  She denied any diaphoresis or  nausea associated with the pain.  The pain resolved on its own and has not recurred.   She has had shortness of breath with this illness that was not particularly worse over the days she had chest discomfort.  Currently her main complaint is the cough, congestion, as well as some right sided upper back and shoulder pain.  She denies fevers, nausea, vomiting.  She does have a sick contact in her grandson, who suffered from fevers and cough this weekend.  Prior Medications: JANUVIA 100 MG TABS (SITAGLIPTIN PHOSPHATE) Take 1 tablet by mouth once a day TUSSIN DM 100-10 MG/5ML SYRP (DEXTROMETHORPHAN-GUAIFENESIN)  IBUPROFEN 600 MG TABS (IBUPROFEN) Take 600mg  by mouth every 6 hours for the next 2-3 days.   Past Medical History:    Diabetes mellitus, type II    Hyperlipidemia    Hypertension    Risk Factors:  Tobacco use:  quit    Physical Exam  General:     alert, well-developed, well-nourished, well-hydrated, and normal appearance.   Head:     normocephalic and atraumatic.   Eyes:     vision grossly intact, pupils equal, pupils round, and pupils reactive to light.   Ears:     L ear normal, w/ nrml TM, cone of light; exam of R is obscured by cerumen Mouth:     pharynx pink and moist, no erythema, no exudates, and no lesions.   Neck:     supple, no masses, and no thyromegaly.   Chest Wall:     no deformities and no tenderness.   Lungs:     normal respiratory effort, no intercostal retractions, no accessory muscle use, normal breath sounds, no dullness, no crackles, and no wheezes.   Heart:     normal rate, regular rhythm, no murmur, no gallop, no rub, and no JVD.   Abdomen:     soft, non-tender, normal bowel sounds, no distention, no masses, and no guarding.   Msk:     right shoulder:  normal ROM, no joint tenderness, no joint swelling, no joint warmth, no redness over joints, no joint deformities, and no joint instability.   She had somewhated limited ABduction above 100  degrees; there was mild/mod crepitus noted with ROM testing. Neurologic:     alert & oriented X3, cranial nerves II-XII intact, strength normal in all extremities, and sensation intact to light touch.   Skin:     color normal, no rashes, and no suspicious lesions.   Cervical Nodes:     no anterior cervical adenopathy.   Psych:     Oriented X3, memory intact for recent and remote, normally interactive, good eye contact, and not anxious appearing.      Impression & Recommendations:  Problem # 1:  COUGH (ICD-786.2) Given that the patient's current symptoms are identical to those three weeks ago, I question whether she has recurrence or possible treatment failure.  It sounds to be consistent with an acute viral upper respiratory tract infection vs acute bronchitis.  Currently she is afebrle, her 02 sats are normal, and her lung exam has no notable findings.  Given the duration of her symptoms though, I think it would be wise to check a chest x ray today to further evaluate.  Otherwise, if it is negative, I plan to treat for symptomatic relief with NSAID's for the pain, a cough suppressant and a mucolytic.  I'll ask her to return in 1-2 weeks.  I will ask her to rest as well and continue to take plenty of fluids by mouth.  If her symptoms worsen I would like for her to return to our clinic sooner to be reevaluated; she is undertanding of this plan and agreeable to it. Orders: Diagnostic X-Ray/Fluoroscopy (Diagnostic X-Ray/Flu)   Problem # 2:  CHEST PAIN, ATYPICAL (ICD-786.59) Her story of having chest pain last week is unlikely to related to angina, as it sounded more pleuritic in nature and likely related to her other URI like symptoms.  Regardless, given her risk factors I've done several things.  I've checked a 12 lead ekg today that reveals: NSR, normal axis, +/- criteria for LVH (no strain, no T or ST changes c/w ishemia, insignificant q's in II, III, aVF.  Essentially unchanged from prior of  12/2005.  I've also asked [facility] to fax over the [facility] she had performed in 2007 after an admission for atypical chest pain.  Essentially it is normal and the appended  note reads "low risk test" - please see the faxed report that I will have scanned into EMR  - bu the official report reads that "a very small area of ischemia cannot be unequivocally excluded" after finding some non specific ST changes during the exercise portion which was some ST flattening in the inferior leads (which patient had no anginal symptoms during); all in the setting of breast attenuation.    At this point, given her EKG and her story I think we can be comfortable that this chest pain was non-cardiac in etiology.  Given that it occurred with deep inspiration and was worse with cough, I believe that it was related to her underlying pulmonary illness.   Orders: 12 Lead EKG (12 Lead EKG)   Problem # 3:  DM (ICD-250.00) She was recently started on Januvia by Dr. Frederico Hamman after several months/years of very poor control.  Today, I'm happy to see her A1C is 6.6, which is I believe the best it has ever benn.  I'm very glad that Dr. 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   Her updated medication list for this problem includes:    Januvia 100 Mg Tabs (Sitagliptin phosphate) .Marland Kitchen... Take 1 tablet by mouth once a day  Orders: T- Capillary Blood Glucose (40347) T-Hgb A1C (in-house) (42595GL)   Medications Added to Medication List This Visit: 1)  Tussin Dm 100-10 Mg/36ml Syrp (Dextromethorphan-guaifenesin) 2)  Ibuprofen 600 Mg Tabs (Ibuprofen) .... Take 600mg  by mouth every 6 hours for the next 2-3 days.   Patient Instructions: 1)  Please schedule a follow up appointment in 1-2 weeks. 2)  Take Mucinex DM (over the counter) as instructed on the box - the active ingredients are guifennasen and  dextromethorphan - you can ask your pharmacist to help you find it 3)  Recommend increasing fluid intake for hydration for the next few days. 4)  Encouraged to get plenty of rest, drink lots of clear liquids, and use Tylenol or Ibuprofen (unless contraindicated) for fever and comfort. To be seen in 7-10 days if no improvement; sooner if worsening of symptoms. 5)  Take 400-600mg  of Ibuprofen (Advil, Motrin) every 4-6 hours as needed for relief of you muscle pains and right shoulder pain.  Laboratory Results   Blood Tests   Date/Time Recieved: November 27, 2006 11:39 AM  Date/Time Reported: November 27, 2006 11:39 AM ..................................................................Marland KitchenAlric Quan  November 27, 2006 11:39 AM   HGBA1C: 6.6%   (Normal Range: Non-Diabetic - 3-6%   Control Diabetic - 6-8%) CBG Random: 109    Appended Document: Orders Update    Clinical Lists Changes  Orders: Added new Service order of Est. Patient Level IV (87564) - Signed

## 2010-10-25 NOTE — Progress Notes (Signed)
Summary: phone/gg  Phone Note Call from Patient   Caller: Patient Summary of Call: Pt called stating she did not take her Lantus last night because she fell asleep.   This AM her CBG was 172.  Please advise Initial call taken by: Merrie Roof RN,  September 06, 2009 9:07 AM  Follow-up for Phone Call        Spoke with Inocencio Homes and advised that Ms. Estrin should take her lantus this morning, continue her usual routine with regards to intake of food and exertion, and then take this evening's dose just before going to bed tonight.  She is then to resume her usual lantus administration schedule to begin tomorrow evening. Follow-up by: Doneen Poisson MD,  September 06, 2009 9:12 AM

## 2010-10-25 NOTE — Progress Notes (Signed)
Summary: Prior AUthorization    Prior Authoriztion approvd for this patient's Fexofenadine 180mg . Authorization approved until 06/03/09. Concepcion Elk   BA.,CPht II  July 30, 2008 4:47 PM

## 2010-10-25 NOTE — Progress Notes (Signed)
Summary: refill/gg  Phone Note Refill Request  on April 04, 2010 3:24 PM  Refills Requested: Medication #1:  BD U/F SHORT PEN NEEDLE 31G X 8 MM  MISC Use to inject your insulin   Last Refilled: 02/16/2010  Method Requested: Electronic Initial call taken by: Merrie Roof RN,  April 04, 2010 3:25 PM    Prescriptions: BD U/F SHORT PEN NEEDLE 31G X 8 MM  MISC (INSULIN PEN NEEDLE) Use to inject your insulin  #1 box x 11   Entered and Authorized by:   Mliss Sax MD   Signed by:   Mliss Sax MD on 04/05/2010   Method used:   Electronically to        [facility] [phone]* (retail)       [address]       [place], [place]  [address]       Ph: [phone]       Fax: [phone]   RxID:   [id number]

## 2010-10-25 NOTE — Assessment & Plan Note (Signed)
Summary: high cbg/gg   Vital Signs:  Patient Profile:   50 Years Old Female Weight:      176.4 pounds (80.18 kg) Temp:     98.7 degrees F (37.06 degrees C) oral Pulse rate:   80 / minute BP sitting:   117 / 84  (right arm)  Pt. in pain?   no  Vitals Entered By: Krystal Eaton Duncan Dull) (February 26, 2007 3:21 PM)              Is Patient Diabetic? Yes  CBG Result 380  Have you ever been in a relationship where you felt threatened, hurt or afraid?Unable to ask  Domestic Violence Intervention female @ side  Does patient need assistance? Functional Status Self care Ambulation Normal   PCP:  Antony Contras MD  Chief Complaint:  elevated blood glucoses and med refill.  History of Present Illness: Teresa Grant is a 50 y/o AAW with a h/o DM,HTN who works as a Surveyor, mining for a living presents to the clinic today with c/o high blood sugar readings for the last month. This morning it read as "high "twice which prompted pt to make this visit and also feelings of dizziness with the high sugars. She reports taking her Metformin and Januvia daily. She had been on glipizide at one point but this was d/ced 2/2 to hypoglycemic attacks especially while on duty. She reports skipping breakfast in the am daily since she needs to report for duty at about 5.30 am. She has met with [name] in the past and apparently has been adhering to a diabetic diet. Pt brings her glucometer with her today to be downloaded.She needs medication refills at this visit as well.  Current Allergies (reviewed today): ! * DOES NOT KNOW    Risk Factors: Tobacco use:  quit    Year quit:  2005?   Review of Systems      See HPI   Physical Exam  General:     alert, well-developed, and well-nourished.   Eyes:     pupils equal, pupils round, and pupils reactive to light.   Mouth:     pharynx pink and moist.   Neck:     supple.   Lungs:     normal respiratory effort, normal breath sounds, no crackles, and no  wheezes.   Heart:     normal rate, regular rhythm, no murmur, and no gallop.   Abdomen:     soft, non-tender, normal bowel sounds, and no distention.      Impression & Recommendations:  Problem # 1:  DIABETES MELLITUS, TYPE II (ICD-250.00) Pt HgbA1C has worsened since last visit 6.0(3/08) -> 8.0. Her log of blood sugars show that her values have been running in the 250-300 range. She needs to be on another oral medication or Insulin. We will start her back on a low dose of Glipizide 5mg  today and ask her to RTC as scheduled next week with her PCP,Dr. Randon Goldsmith. Pt was advised if she develops signs/symptoms of hypoglycemia to d/c drug immediately and RTC. She was advised to make sure she eats all meals especially while taking the diabetic meds to avoid hypoglycemic events.This was d/w pt's PCP who will be seeing pt next week. She was encouraged to bring her glucometer as she did today so it can be downloaded and values compared.Of note, her current medication list has been updated. Also BMET is being checked for electrolyte abnormalities with her high blood sugars. The following  medications were removed from the medication list:    Glipizide 10 Mg Tabs (Glipizide) .Marland Kitchen... Take two tablets by mouth at night  Her updated medication list for this problem includes:    Januvia 100 Mg Tabs (Sitagliptin phosphate) .Marland Kitchen... Take 1 tablet by mouth once a day    Metformin Hcl 1000 Mg Tabs (Metformin hcl) .Marland Kitchen... Take 1 tablet by mouth two times a day    Diovan 160 Mg Tabs (Valsartan) .Marland Kitchen... Take 1 tablet by mouth once a day    Glucotrol 5 Mg Tabs (Glipizide) .Marland Kitchen... Take 1 tablet by mouth once a day  Orders: T-Hgb A1C (in-house) (40102VO) T- Capillary Blood Glucose (53664) T-Basic Metabolic Panel (40347-42595)   Problem # 2:  HYPERTENSION (ICD-401.9) Pt's BP is great today. No medication change. Refills provided for Norvasc. Her updated medication list for this problem includes:    Diovan 160 Mg Tabs  (Valsartan) .Marland Kitchen... Take 1 tablet by mouth once a day    Norvasc 10 Mg Tabs (Amlodipine besylate) .Marland Kitchen... Take 1 tablet by mouth once a day  Orders: T-Hgb A1C (in-house) ([id number]) T- Capillary Blood Glucose (33295)   Problem # 3:  HYPERLIPIDEMIA (ICD-272.4) Prescription was refilled today. She needs a FLP and CMET on next visit with her PCP. Her updated medication list for this problem includes:    Lipitor 20 Mg Tabs (Atorvastatin calcium) .Marland Kitchen... Take 1 tablet by mouth once a day   Medications Added to Medication List This Visit: 1)  Metformin Hcl 1000 Mg Tabs (Metformin hcl) .... Take 1 tablet by mouth two times a day 2)  Glucotrol 5 Mg Tabs (Glipizide) .... Take 1 tablet by mouth once a day 3)  Lipitor 20 Mg Tabs (Atorvastatin calcium) .... Take 1 tablet by mouth once a day 4)  Norvasc 10 Mg Tabs (Amlodipine besylate) .... Take 1 tablet by mouth once a day   Patient Instructions: 1)  Keep your appointment with Dr. Randon Goldsmith next week. 2)  Bring your glucometer with you on your next visit. 3)  Check your blood sugars regularly. If your readings are usually above 250 or below 70 you should contact our office. 4)  Check your Blood Pressure regularly. If it is above 140/90 consistently,you should make an appointment. Yours was great today! 5)  It is important that your Diabetic A1c level is checked every 3 months.Yours was 8 today. Prescriptions: NORVASC 10 MG TABS (AMLODIPINE BESYLATE) Take 1 tablet by mouth once a day  #31 x 5   Entered and Authorized by:   Yetta Barre MD   Signed by:   Yetta Barre MD on 02/26/2007   Method used:   Handwritten   RxID:   [id number] LIPITOR 20 MG TABS (ATORVASTATIN CALCIUM) Take 1 tablet by mouth once a day  #31 x 5   Entered and Authorized by:   Yetta Barre MD   Signed by:   Yetta Barre MD on 02/26/2007   Method used:   Handwritten   RxID:   [id number] GLUCOTROL 5 MG TABS (GLIPIZIDE) Take 1 tablet by mouth once a day  #31 x 5    Entered and Authorized by:   Yetta Barre MD   Signed by:   Yetta Barre MD on 02/26/2007   Method used:   Handwritten   RxID:   [id number]   Laboratory Results   Blood Tests   Date/Time Recieved: February 26, 2007 3:34 PM  Date/Time Reported: February 26, 2007 3:34 PM ..................................................................Marland KitchenClare Gandy  February 26, 2007 3:34 PM   HGBA1C: 8.0%   (Normal Range: Non-Diabetic - 3-6%   Control Diabetic - 6-8%) CBG Random: 380  CBC

## 2010-10-25 NOTE — Assessment & Plan Note (Signed)
Summary: elevated cbg/gg   Vital Signs:  Patient Profile:   50 Years Old Female Height:     62.5 inches (158.75 cm) Weight:      180 pounds (81.82 kg) BMI:     32.51 Temp:     97.4 degrees F (36.33 degrees C) oral Pulse rate:   76 / minute BP sitting:   118 / 81  (right arm)  Pt. in pain?   no  Vitals Entered By: Angelina Ok RN (December 11, 2007 10:37 AM)              Is Patient Diabetic? Yes  Nutritional Status BMI of > 30 = obese CBG Result 438  Have you ever been in a relationship where you felt threatened, hurt or afraid?No   Does patient need assistance? Functional Status Self care Ambulation Normal     PCP:  [name]  Chief Complaint:  Urinary Frequency, pain when using bathroom, feels bladder is still full, and no fevers.  History of Present Illness: 50 yo F who presents for elevated sugars and using the bathroom a lot.  Her sugars have been high on her meter for the past couple of weeks.  Her sugars have been reading "high" on the meter which she knows means more than 500.  She feels sleepy.  She is also using the bathroom about every 5 minutes.  She has no burning when she urinates, no back pain.  She has pressure in her lower abd.  She urinates a lot each time and she feels like her bladder is filling up as soon as she empties it.  She has been drinking a lot of water and tea with sugar in it.  She does not drink diet drinks.  She's had DM for 2-3 years.  Her sugars have recently been out of control for the past couple of months because she's been out of medicaid and she hasn't been able to afford her meds.    Prior Medication List:  JANUVIA 100 MG TABS (SITAGLIPTIN PHOSPHATE) Take 1 tablet by mouth once a day METFORMIN HCL 1000 MG TABS (METFORMIN HCL) Take 1 tablet by mouth two times a day DIOVAN 160 MG TABS (VALSARTAN) Take 1 tablet by mouth once a day CLARITIN 10 MG TABS (LORATADINE) Take 1 tablet by mouth once a day GLIPIZIDE 10 MG  TABS  (GLIPIZIDE) Take 1 tablet by mouth once a day ZOCOR 40 MG  TABS (SIMVASTATIN) Take 1 tablet by mouth once a day METOPROLOL TARTRATE 25 MG  TABS (METOPROLOL TARTRATE) Take one tablet by mouth twice a day MECLIZINE HCL 25 MG  CHEW (MECLIZINE HCL) Take 1 tablet by mouth two times a day   Current Allergies (reviewed today): ! * DOES NOT KNOW  Past Medical History:    Reviewed history from 03/07/2007 and no changes required:       Diabetes mellitus, type II       Hyperlipidemia       Hypertension       Seasonal allergies    Risk Factors: Tobacco use:  quit    Year quit:  2006 Alcohol use:  no Exercise:  no Seatbelt use:  100 %   Review of Systems      See HPI   Physical Exam  General:     Well-developed,well-nourished,in no acute distress; alert,appropriate and cooperative throughout examination Mouth:     dry mm Neck:     supple and full ROM.   Lungs:  Clear Heart:     normal rate, regular rhythm, and no murmur.   Abdomen:     soft, non-tender, and normal bowel sounds.   Extremities:     No edema Neurologic:     alert & oriented X3.   Skin:     Intact without suspicious lesions or rashes Psych:     Appropriate    Impression & Recommendations:  Problem # 1:  HYPERGLYCEMIA (ICD-790.29) This is due to the pts medication nonadherance due to the cost of her medicines.  All of the pts meds were changed to $4 medications and she was also started on Lantus 15 units at bedtime with a self-titration guide to help her overcome the sugar-toxicity.  She may be able to come off the Lantus if her sugars respond well to the insulin in time.  She will continue with the Metformin and the Glipizide for now as well.  She was instructed to RTC in 1-2 weeks with her CBG log to have her meds adjusted.  She was also referred to [name] who saw the pt today.   The following medications were removed from the medication list:    Januvia 100 Mg Tabs (Sitagliptin phosphate) .Marland Kitchen...  Take 1 tablet by mouth once a day  Her updated medication list for this problem includes:    Metformin Hcl 1000 Mg Tabs (Metformin hcl) .Marland Kitchen... Take 1 tablet by mouth two times a day    Glipizide 10 Mg Tabs (Glipizide) .Marland Kitchen... Take 1 tablet by mouth once a day    Lantus Solostar 100 Unit/ml Soln (Insulin glargine) ..... Inject 15 units into the skin of your abdomen once every night  Labs Reviewed: HgBA1c: 7.6 (11/20/2007)   Creat: 0.73 (11/20/2007)      Problem # 2:  POLYURIA (ZHY-865.78) Most likely 2/2 Problem #1, but will check a UA to make sure she doesn't have a UTI.  Orders: T-Urinalysis Dipstick only (46962XB) T-Urinalysis (28413-24401)   Problem # 3:  DIABETES MELLITUS, TYPE II (ICD-250.00) We spent a lot of time discussing lifestyle modifications and adjusting her medications today to fit her budget.  She is a good candidate for self-titration of the Lantus and was able to demonstrate being able to use the Lantus pen (sample given with a start-up kit) and before she left, she took 15 units of Lantus.  She was seen by [name] as well tody for further education.  When she RTC, she will need a BMET since I changed her ARB to an ACE-I for cost issues.  The following medications were removed from the medication list:    Januvia 100 Mg Tabs (Sitagliptin phosphate) .Marland Kitchen... Take 1 tablet by mouth once a day    Diovan 160 Mg Tabs (Valsartan) .Marland Kitchen... Take 1 tablet by mouth once a day  Her updated medication list for this problem includes:    Metformin Hcl 1000 Mg Tabs (Metformin hcl) .Marland Kitchen... Take 1 tablet by mouth two times a day    Glipizide 10 Mg Tabs (Glipizide) .Marland Kitchen... Take 1 tablet by mouth once a day    Lisinopril 20 Mg Tabs (Lisinopril) .Marland Kitchen... Take 1 tablet by mouth once a day    Lantus Solostar 100 Unit/ml Soln (Insulin glargine) ..... Inject 15 units into the skin of your abdomen once every night  Orders: Capillary Blood Glucose (02725) Fingerstick (36644) Capillary Blood Glucose  (03474) Fingerstick (25956) T-Basic Metabolic Panel ([id number]) Diabetic Clinic Referral (Diabetic)  Labs Reviewed: HgBA1c: 7.6 (11/20/2007)   Creat: 0.73 (  11/20/2007)      Complete Medication List: 1)  Metformin Hcl 1000 Mg Tabs (Metformin hcl) .... Take 1 tablet by mouth two times a day 2)  Loratadine 10 Mg Tabs (Loratadine) .... Take 1 tablet by mouth once a day 3)  Glipizide 10 Mg Tabs (Glipizide) .... Take 1 tablet by mouth once a day 4)  Pravastatin Sodium 40 Mg Tabs (Pravastatin sodium) .... Take 1 tablet by mouth at bedtime 5)  Metoprolol Tartrate 25 Mg Tabs (Metoprolol tartrate) .... Take one tablet by mouth twice a day 6)  Meclizine Hcl 25 Mg Chew (Meclizine hcl) .... Take 1 tablet by mouth two times a day 7)  Lisinopril 20 Mg Tabs (Lisinopril) .... Take 1 tablet by mouth once a day 8)  Lantus Solostar 100 Unit/ml Soln (Insulin glargine) .... Inject 15 units into the skin of your abdomen once every night 9)  Bd U/f Short Pen Needle 31g X 8 Mm Misc (Insulin pen needle) .... Use to inject your insulin 10)  Freestyle Test Strp (Glucose blood) .... Use as directed.   Patient Instructions: 1)  Please schedule a follow-up appointment in 1-2 weeks for a blood pressure and diabetes check. 2)  Inject 15 units into the skin of the abdomen every night to help control your diabetes.  Increase the number of units by 1 unit every night if your sugar is more than 150 in the morning for 2 mornings in a row. 3)  Check your sugars twice a day according to the log and bring the log back with you (filled out) to your next visit. 4)  Once you are done with the Diovan, start taking the Lisinopril. 5)  Stop using the Januvia. 6)  Once you are done using the Zocor, start using the Pravastatin.  7)  The Claritin has been changed to generic Loratadine.    Prescriptions: FREESTYLE TEST   STRP (GLUCOSE BLOOD) Use as directed.  #100 x 11   Entered and Authorized by:   Chauncey Reading DO   Signed  by:   Chauncey Reading DO on 12/11/2007   Method used:   Electronically sent to ...       [address]*       [address]       [place], [place]  [address]       Ph: [phone]       Fax: [phone]   RxID:   [id number] BD U/F SHORT PEN NEEDLE 31G X 8 MM  MISC (INSULIN PEN NEEDLE) Use to inject your insulin  #1 box x 11   Entered and Authorized by:   Chauncey Reading DO   Signed by:   Chauncey Reading DO on 12/11/2007   Method used:   Electronically sent to ...       [address]*       [address]       [place], [place]  [address]       Ph: [phone]       Fax: [phone]   RxID:   [id number] LANTUS SOLOSTAR 100 UNIT/ML  SOLN (INSULIN GLARGINE) Inject 15 units into the skin of your abdomen once every night  #5 cartridges x 11   Entered and Authorized by:   Chauncey Reading DO   Signed by:   Chauncey Reading DO on 12/11/2007   Method used:   Electronically sent to ...       [address]       [address]  [place], [place]  [address]       Ph: [phone]       Fax: [phone]   RxID:   [id number] LISINOPRIL 20 MG  TABS (LISINOPRIL) Take 1 tablet by mouth once a day  #30 x 11   Entered and Authorized by:   Chauncey Reading DO   Signed by:   Chauncey Reading DO on 12/11/2007   Method used:   Electronically sent to ...       [address]*       [address]       [place], [place]  [address]       Ph: [phone]       Fax: [phone]   RxID:   [id number] PRAVASTATIN SODIUM 40 MG  TABS (PRAVASTATIN SODIUM) Take 1 tablet by mouth at bedtime  #30 x 11   Entered and Authorized by:   Chauncey Reading DO   Signed by:   Chauncey Reading DO on 12/11/2007   Method used:   Electronically sent to ...       [address]       [address]       [place], [place]  [address]       Ph: [phone]       Fax: [phone]   RxID:   [id number] LORATADINE 10 MG  TABS (LORATADINE) Take 1 tablet by mouth once a day  #30 x 11   Entered and  Authorized by:   Chauncey Reading DO   Signed by:   Chauncey Reading DO on 12/11/2007   Method used:   Electronically sent to ...       [address]*       [address]       [place], [place]  [address]       Ph: [phone]       Fax: [phone]   RxID:   [id number]  ] Laboratory Results   Urine Tests  Date/Time Recieved: 12/11/07 10:49 AM Date/Time Reported: 12/11/07 10:51 AM GH  Routine Urinalysis   Color: yellow Appearance: Hazy Glucose: >=1000   (Normal Range: Negative) Bilirubin: negative   (Normal Range: Negative) Ketone: negative   (Normal Range: Negative) Spec. Gravity: 1.010   (Normal Range: 1.003-1.035) Blood: trace-intact   (Normal Range: Negative) pH: 5.0   (Normal Range: 5.0-8.0) Protein: trace   (Normal Range: Negative) Urobilinogen: 0.2   (Normal Range: 0-1) Nitrite: negative   (Normal Range: Negative) Leukocyte Esterace: negative   (Normal Range: Negative)     Blood Tests     CBG Random: 438

## 2010-10-25 NOTE — Letter (Signed)
Summary: [facility]  [facility]   Imported By: Margie Billet 10/11/2007 11:43:50  _____________________________________________________________________  External Attachment:    Type:   Image     Comment:   External Document

## 2010-10-25 NOTE — Miscellaneous (Signed)
Summary: [facility]   Imported By: Margie Billet 04/04/2010 14:18:32  _____________________________________________________________________  External Attachment:    Type:   Image     Comment:   External Document

## 2010-10-25 NOTE — Assessment & Plan Note (Signed)
Summary: FU/VS   Vital Signs:  Patient Profile:   50 Years Old Female Height:     61.75 inches (156.85 cm) Weight:      174.3 pounds (79.23 kg) BMI:     32.25 O2 Sat:      98 % Temp:     97.1 degrees F (36.17 degrees C) oral Pulse rate:   82 / minute BP sitting:   113 / 79  (right arm)  Pt. in pain?   no  Vitals Entered By: Fidel Cloud (March 07, 2007 2:01 PM) Oxygen therapy Room Air              Is Patient Diabetic? Yes  Nutritional Status BMI of > 30 = obese CBG Result 229  Have you ever been in a relationship where you felt threatened, hurt or afraid?No   Does patient need assistance? Functional Status Self care Ambulation Normal   PCP:  Antony Contras MD  Chief Complaint:  1 month follow-up and c/o bronchitis.  History of Present Illness: Teresa Grant is a 50 yo AAW followed in [facility] for DM 2, HTN, dyslipidemia, among other things, who returns for a scheduled follow up after being seen recently for symptomatic hyperglycemia.  During her last visit she has been restarted on Glipizide.  She's been doing "ok" since she was last seen. She's started taking glipized but only in the last 2-3 days have her CBG's shown any improvement.  She reports that they are still consistently in the 300's.   Today, she also reports that she's had a sore/scrathy throat, cough, and some nasal congestion over the past 48-72 hrs.  No fever, chills; cough is nonproductive.  No sick contacts.  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  Current Allergies (reviewed today): ! * DOES NOT KNOW  Past Medical History:    Diabetes mellitus, type II    Hyperlipidemia    Hypertension    Seasonal allergies    Risk Factors: Tobacco use:  quit    Year quit:  2006   Review of Systems  General      Denies chills, fatigue, fever, malaise, and sweats.  Eyes      Complains of blurring.      Denies itching, red eye, and vision loss-both eyes.  ENT  Complains of hoarseness, nasal congestion, postnasal drainage, and sore throat.      Denies sinus pressure.  CV      Denies chest pain or discomfort and shortness of breath with exertion.  Resp      Complains of wheezing.      Denies chest discomfort, chest pain with inspiration, shortness of breath, and sputum productive.  Teresa      Denies joint pain.  Derm      Denies changes in color of skin.  Neuro      Denies weakness.   Physical Exam  General:     alert, well-developed, and well-nourished.   Head:     normocephalic and atraumatic.   Eyes:     vision grossly intact, pupils equal, pupils round, and pupils reactive to light.   Nose:     no external erythema and no nasal discharge.   Mouth:     pharynx pink and moist.   Neck:     supple.   Lungs:     normal respiratory effort, no intercostal retractions, normal breath sounds, no crackles, and no wheezes.   Heart:  normal rate, regular rhythm, no murmur, no gallop, no rub, and no JVD.   Abdomen:     soft, non-tender, normal bowel sounds, no distention, no masses, and no guarding.   Extremities:     no cyanosis, clubbing, edema good distal DP pulse in bl LE no opens sores, erythema of feet normal sensation Neurologic:     alert & oriented X3 and gait normal.   Skin:     turgor normal and color normal.   Psych:     Oriented X3, memory intact for recent and remote, normally interactive, good eye contact, and not anxious appearing.      Impression & Recommendations:  Problem # 1:  DIABETES MELLITUS, TYPE II (ICD-250.00) Some very slight improvement since starting the 5 of glipizide.  CBG's from home meter still consistently in the 300's.  I will double her glipizide today.  This patient is sill quite hesitant about the idea of starting inulin.  I would like to see her back in  ~ 1 month.  If her CBG's have improved on the increased dose of glipizide, fine, we can titrate that.  I'm really somwhat perplexed.   She has an A1c of 6 in 3/08 and now it is 8 in 6/08.  I've really tried to ask her what, if anything, is different.  She says that her work has caused some increased stress lately, but otherwise nothing she can identify.  Again, my goal A1C for this patient is 7.0.  I think if we can improve her CBG's with glipizide over the next 4 weeks then we can continue using oral meds.  If not, we need to have her see [name] for likely initation of insulin (Lantus + oral meds).   Does not know last time she had dilated fundoscopic exam.  Referral today to [facility].  Her updated medication list for this problem includes:    Januvia 100 Mg Tabs (Sitagliptin phosphate) .Marland Kitchen... Take 1 tablet by mouth once a day    Metformin Hcl 1000 Mg Tabs (Metformin hcl) .Marland Kitchen... Take 1 tablet by mouth two times a day    Diovan 160 Mg Tabs (Valsartan) .Marland Kitchen... Take 1 tablet by mouth once a day    Glipizide 10 Mg Tabs (Glipizide) .Marland Kitchen... Take 1 tablet by mouth once a day  Orders: Capillary Blood Glucose ([address]) Fingerstick ([address])  Future Orders: T-Lipid Profile ([id number]) ... 03/08/2007 Ophthalmology Referral (Ophthalmology) ... 03/22/2007  Labs Reviewed: HgBA1c: 8.0 (02/26/2007)   Creat: 0.90 (02/26/2007)      Problem # 2:  HYPERLIPIDEMIA (ICD-272.4) Her last FLP was in 11/2005.  We will ask her to return fasting for recheck given how long it has been.   Her updated medication list for this problem includes:    Lipitor 20 Mg Tabs (Atorvastatin calcium) .Marland Kitchen... Take 1 tablet by mouth once a day  Future Orders: T-Lipid Profile ([id number]) ... 03/08/2007   Problem # 3:  HYPERTENSION (ICD-401.9) Blood pressure is well controlled on current regimen.  No changes indicated at this point.    Her updated medication list for this problem includes:    Diovan 160 Mg Tabs (Valsartan) .Marland Kitchen... Take 1 tablet by mouth once a day    Norvasc 10 Mg Tabs (Amlodipine besylate) .Marland Kitchen... Take 1 tablet by mouth once a day   Problem # 4:   PREVENTIVE HEALTH CARE (ICD-V70.0) She estimates that it has been over 10 years since her last mammogram.  Implying she started having mammograms at 25.  Regardless, she's 50 yo and I have no record of prior mammogram.  We'll have her call [facility] to set one up.  We really need fo make sure she has done this when she returns in 1 month.  If she hasn't then we can schedule it for her. No pap; s/p hysterectomy.  Orders: Mammogram (Screening) (Mammo)  Future Orders: Ophthalmology Referral (Ophthalmology) ... 03/22/2007   Problem # 5:  COUGH (ICD-786.2) Her symptoms sound like either an acute bronchitis, a viral URI, or seasonal allergies; even any combination.  Given that she's afebrile, her lung exam is normal, and she has no concerning/red flag symptoms, I'm OK with treating this conservatively for the time being.  She has been perscribed claritin in the past for allergic rhinitis, which she has not been taking.  I've asked her to start another trial of this now, as well as any OTC cough medications she finds helpful.  If she's no better in 7-10 days, I've asked her to call us.  Medications Added to Medication List This Visit: 1)  Glipizide 10 Mg Tabs (Glipizide) .... Take 1 tablet by mouth once a day   Patient Instructions: 1)  Please schedule a follow-up appointment in 1 month. 2)  Please start taking Glipizide 10mg  once daily.  (you can take 2 of your 5mg  tabs until the bottle runs out, then fill the new perscription) 3)  Continue checking your CBGs and bring your meter with you to your next appointment. 4)  Your scratchy throat and cough may be due to a component of seasonal allergies.  Restart your claritin, once daily.  5)  You can continue taking robitussin as needed for cough. 6)  A chloraceptic throat spray may help with your symptoms as well. 7)  Get plenty of rest, drink lots of clear liquids, and use tylenol or Ibuprophen for fever and comfort.  8)  Please return for a  FASTING Lipid Profile one (1) week before your next appointment as scheduled. 9)  See your eye doctor yearly to check for diabetic eye damage - we will refer you to once. 10)  Acute bronchitis symptoms for less than 10 days are not helped by antibiotics. take over the counter cough medications. call if no improvment in  5-7 days, sooner if increasing cough, fever, or new symptoms( shortness of breath, chest pain). 11)  **Please call [facility] for a mammogram**  [phone] Prescriptions: GLIPIZIDE 10 MG  TABS (GLIPIZIDE) Take 1 tablet by mouth once a day  #31 x 6   Entered and Authorized by:   Antony Contras MD   Signed by:   Antony Contras MD on 03/07/2007   Method used:   Print then Give to Patient   RxID:   [id number]

## 2010-10-25 NOTE — Assessment & Plan Note (Signed)
Summary: feet swelling/gg   Vital Signs:  Patient profile:   50 year old female Height:      62.5 inches (158.75 cm) Weight:      182.3 pounds (82.86 kg) BMI:     32.93 Temp:     97.8 degrees F (36.56 degrees C) oral Pulse rate:   66 / minute BP sitting:   116 / 81  (left arm)  Vitals Entered By: Filomena Jungling NT II (July 22, 2009 1:55 PM) CC: Feet swelling / legs numb hard to bend toes and tight / difficulty walking Is Patient Diabetic? Yes Did you bring your meter with you today? Yes Pain Assessment Patient in pain? yes     Location: Feet Intensity: 8 Type: throbbing/aching Onset of pain  Saturday night when she noticed they were swollen Nutritional Status BMI of > 30 = obese CBG Result 136  Have you ever been in a relationship where you felt threatened, hurt or afraid?No   Does patient need assistance? Functional Status Self care Ambulation Normal Comments Blood glucose meter [place] manually read: 7-day average 94 mg/dl; 16-XWR average 604; 30 day average 122   Diabetic Foot Exam Last Podiatry Exam Date: 06/11/2008 Foot Inspection Is there a history of a foot ulcer?              No Is there a foot ulcer now?              No Can the patient see the bottom of their feet?          No Are the shoes appropriate in style and fit?          Yes Is there swelling or an abnormal foot shape?          No Are the toenails long?                No Are the toenails thick?                No Are the toenails ingrown?              No Is there heavy callous build-up?              No Is there pain in the calf muscle (Intermittent claudication) when walking?    NoIs there a claw toe deformity?              No Is there elevated skin temperature?            No Is there limited ankle dorsiflexion?            No Is there foot or ankle muscle weakness?            No  Diabetic Foot Care Education    10-g (5.07) Semmes-Weinstein Monofilament Test Performed by: Filomena Jungling NT II     Right Foot          Left Foot Site 1         normal         normal Site 2         normal         normal Site 3         normal         normal Site 4         normal         normal Site 5         normal  normal Site 6         normal         normal Site 7         normal         normal Site 8         normal         normal Site 9         normal         normal  Impression      normal         normal Ladona Mow SMA July 22, 2009 2:39 PM   Primary Care Betzy Barbier:  Mliss Sax MD  CC:  Feet swelling / legs numb hard to bend toes and tight / difficulty walking.  History of Present Illness: Pt is a 50 years old female with PMH of DM, HTN, HLD came here for regualr f/u and both foot swelling and itching. She has both foot swelling and itching since saturday and worse when walking, has resolved yesterday. She has not related with any new meds or special food.  She deneis fever, chills, any other trauma or injury, no dysuria or diarrhea.  her legs. She reports she has been taking all her medications as instructed except lantus, which she took 30 units insead of 42 units as instructed in the past several days, because she is worried about hypoglycemia and one month ago her CBG sometimes runs as low as 30s, she relates this may be due to not eat well.    Preventive Screening-Counseling & Management  Alcohol-Tobacco     Smoking Status: quit     Year Quit: 2006  Caffeine-Diet-Exercise     Does Patient Exercise: no     Times/week: 0  Problems Prior to Update: 1)  Lumbago  (ICD-724.2) 2)  Abdominal Pain Right Lower Quadrant  (ICD-789.03) 3)  Overflow Incontinence  (ICD-788.38) 4)  Myalgia  (ICD-729.1) 5)  Edema Leg  (ICD-782.3) 6)  Vertigo  (ICD-780.4) 7)  Preventive Health Care  (ICD-V70.0) 8)  Aftercare, Long-term Use, Medications Nec  (ICD-V58.69) 9)  Obesity  (ICD-278.00) 10)  Hypertension  (ICD-401.9) 11)  Hyperlipidemia  (ICD-272.4) 12)  Diabetes Mellitus, Type II   (ICD-250.00) 13)  Cough  (ICD-786.2)  Medications Prior to Update: 1)  Metformin Hcl 1000 Mg Tabs (Metformin Hcl) .... Take 1 Tablet By Mouth Two Times A Day 2)  Glipizide 5 Mg Tabs (Glipizide) .... Take 1 Tablet By Mouth Once in The Morning 3)  Pravachol 40 Mg Tabs (Pravastatin Sodium) .... Take 2 Tablet By Mouth Once A Day 4)  Toprol Xl 100 Mg Xr24h-Tab (Metoprolol Succinate) .... Take 1 Tablet By Mouth Once A Day 5)  Lantus Solostar 100 Unit/ml  Soln (Insulin Glargine) .... Inject 42units Into The Skin of Your Abdomen Once Every Night 6)  Bd U/f Short Pen Needle 31g X 8 Mm  Misc (Insulin Pen Needle) .... Use To Inject Your Insulin 7)  Freestyle Test   Strp (Glucose Blood) .... Use As Directed. 8)  Amlodipine Besylate 10 Mg Tabs (Amlodipine Besylate) .... Take 1 Tablet By Mouth Once A Day 9)  Cozaar 50 Mg Tabs (Losartan Potassium) .... Take 1 Tablet By Mouth Once A Day 10)  Flexeril 5 Mg Tabs (Cyclobenzaprine Hcl) .... Take 1 Tab By Mouth At Bedtime  Current Medications (verified): 1)  Metformin Hcl 1000 Mg Tabs (Metformin Hcl) .... Take 1 Tablet By Mouth Two Times A Day 2)  Glipizide 5 Mg Tabs (  Glipizide) .... Take 1 Tablet By Mouth Once in The Morning 3)  Pravachol 40 Mg Tabs (Pravastatin Sodium) .... Take 2 Tablet By Mouth Once A Day 4)  Toprol Xl 100 Mg Xr24h-Tab (Metoprolol Succinate) .... Take 1 Tablet By Mouth Once A Day 5)  Lantus Solostar 100 Unit/ml  Soln (Insulin Glargine) .... Inject 35 Units Into The Skin of Your Abdomen Once Every Night 6)  Bd U/f Short Pen Needle 31g X 8 Mm  Misc (Insulin Pen Needle) .... Use To Inject Your Insulin 7)  Freestyle Test   Strp (Glucose Blood) .... Use As Directed. 8)  Amlodipine Besylate 10 Mg Tabs (Amlodipine Besylate) .... Take 1 Tablet By Mouth Once A Day 9)  Cozaar 50 Mg Tabs (Losartan Potassium) .... Take 1 Tablet By Mouth Once A Day 10)  Flexeril 5 Mg Tabs (Cyclobenzaprine Hcl) .... Take 1 Tab By Mouth At Bedtime  Allergies  (verified): 1)  ! Hydrochlorothiazide 2)  ! * Lisnopril  Past History:  Past Medical History: Last updated: 03/07/2007 Diabetes mellitus, type II Hyperlipidemia Hypertension Seasonal allergies  Past Surgical History: Last updated: 01/23/2008 Hysterectomy 2001-? but thinks fibroids.  Social History: Last updated: 01/23/2008 No smoking, no etoh, or illegal drugs.  Married, works  driving a school bus.  Risk Factors: Smoking Status: quit (07/22/2009)  Social History: Reviewed history from 01/23/2008 and no changes required. No smoking, no etoh, or illegal drugs.  Married, works  driving a school bus.  Review of Systems       The patient complains of weight gain and peripheral edema.  The patient denies anorexia, fever, vision loss, decreased hearing, hoarseness, chest pain, syncope, dyspnea on exertion, prolonged cough, headaches, hemoptysis, abdominal pain, and melena.    Physical Exam  General:  alert, well-developed, well-nourished, well-hydrated, and overweight-appearing.   Head:  normocephalic.   Eyes:  vision grossly intact, pupils equal, pupils round, and pupils reactive to light.   Ears:  no external deformities.   Nose:  no external erythema.   Mouth:  pharynx pink and moist.   Neck:  supple.   Lungs:  normal breath sounds, no crackles, and no wheezes.   Heart:  normal rate, regular rhythm, no murmur, and no JVD.   Abdomen:  soft, non-tender, normal bowel sounds, no distention, and no masses.   Msk:  normal ROM, no joint tenderness, no joint swelling, no joint warmth, and no redness over joints.   Pulses:  2+ Extremities:  No edema. Neurologic:  alert & oriented X3, cranial nerves II-XII intact, strength normal in all extremities, sensation intact to light touch, and gait normal.    Diabetes Management Exam:    Foot Exam (with socks and/or shoes not present):       Sensory-Monofilament:          Left foot: normal          Right foot: normal   Impression &  Recommendations:  Problem # 1:  DIABETES MELLITUS, TYPE II (ICD-250.00) Assessment Improved Her A1C 8.3 and CBG has well controlled (80s-170s). She has occasional hypoglycemia a month ago and recently herself changed lantus to 30 units. To better control of her DM and avoid hypoglycemia, will change lantus to 35 units while keeping other meds dose unchanged. Will check CBG at next visit in 3 months. Will have foot exam. Advised weight loss, healthy diet and exercise to better control her CBG. She verbally understands that she will take lantus 35 units.   Her updated medication  list for this problem includes:    Metformin Hcl 1000 Mg Tabs (Metformin hcl) .Marland Kitchen... Take 1 tablet by mouth two times a day    Glipizide 5 Mg Tabs (Glipizide) .Marland Kitchen... Take 1 tablet by mouth once in the morning    Lantus Solostar 100 Unit/ml Soln (Insulin glargine) ..... Inject 35 units into the skin of your abdomen once every night    Cozaar 50 Mg Tabs (Losartan potassium) .Marland Kitchen... Take 1 tablet by mouth once a day  Orders: T- Capillary Blood Glucose (14782) T-Hgb A1C (in-house) (95621HY)  Labs Reviewed: Creat: 0.83 (04/01/2009)    Reviewed HgBA1c results: 8.2 (07/22/2009)  9.2 (04/01/2009)  Problem # 2:  EDEMA LEG (ICD-782.3) Assessment: Improved She said she has both foot swelling for 3 day and now resolved. Not clear the cause, may be due to venous insufficiency, questionable allergy or drug side effects, CHF is less likely. As it resoled, will monitor, no further work up for this.   Problem # 3:  HYPERTENSION (ICD-401.9) Assessment: Unchanged At the goal and will continue current regimen.  Her updated medication list for this problem includes:    Toprol Xl 100 Mg Xr24h-tab (Metoprolol succinate) .Marland Kitchen... Take 1 tablet by mouth once a day    Amlodipine Besylate 10 Mg Tabs (Amlodipine besylate) .Marland Kitchen... Take 1 tablet by mouth once a day    Cozaar 50 Mg Tabs (Losartan potassium) .Marland Kitchen... Take 1 tablet by mouth once a day  BP  today: 116/81 Prior BP: 119/81 (04/01/2009)  Labs Reviewed: K+: 3.9 (04/01/2009) Creat: : 0.83 (04/01/2009)   Chol: 136 (01/15/2009)   HDL: 41 (01/15/2009)   LDL: 83 (01/15/2009)   TG: 61 (01/15/2009)  Problem # 4:  HYPERLIPIDEMIA (ICD-272.4) Assessment: Comment Only At the goal and recommended weight loss, healthy diet and exercise.  Her updated medication list for this problem includes:    Pravachol 40 Mg Tabs (Pravastatin sodium) .Marland Kitchen... Take 2 tablet by mouth once a day  Labs Reviewed: SGOT: 16 (04/01/2009)   SGPT: 14 (04/01/2009)   HDL:41 (01/15/2009), 43 (05/28/2008)  LDL:83 (01/15/2009), 87 (05/28/2008)  Chol:136 (01/15/2009), 151 (05/28/2008)  Trig:61 (01/15/2009), 105 (05/28/2008)  Complete Medication List: 1)  Metformin Hcl 1000 Mg Tabs (Metformin hcl) .... Take 1 tablet by mouth two times a day 2)  Glipizide 5 Mg Tabs (Glipizide) .... Take 1 tablet by mouth once in the morning 3)  Pravachol 40 Mg Tabs (Pravastatin sodium) .... Take 2 tablet by mouth once a day 4)  Toprol Xl 100 Mg Xr24h-tab (Metoprolol succinate) .... Take 1 tablet by mouth once a day 5)  Lantus Solostar 100 Unit/ml Soln (Insulin glargine) .... Inject 35 units into the skin of your abdomen once every night 6)  Bd U/f Short Pen Needle 31g X 8 Mm Misc (Insulin pen needle) .... Use to inject your insulin 7)  Freestyle Test Strp (Glucose blood) .... Use as directed. 8)  Amlodipine Besylate 10 Mg Tabs (Amlodipine besylate) .... Take 1 tablet by mouth once a day 9)  Cozaar 50 Mg Tabs (Losartan potassium) .... Take 1 tablet by mouth once a day 10)  Flexeril 5 Mg Tabs (Cyclobenzaprine hcl) .... Take 1 tab by mouth at bedtime  Patient Instructions: 1)  Please schedule a follow-up appointment in 3 months. 2)  You need to lose weight. Consider a lower calorie diet and regular exercise.  3)  Check your blood sugars regularly. If your readings are usually above : or below 70 you should contact our office. 4)  It is  important that your Diabetic A1c level is checked every 3 months.  Prevention & Chronic Care Immunizations   Influenza vaccine: Fluvax Non-MCR  (06/11/2008)   Influenza vaccine deferral: Deferred  (04/01/2009)   Influenza vaccine due: 05/26/2009    Tetanus booster: Not documented   Tetanus booster due: 04/02/2019    Pneumococcal vaccine: Not documented   Pneumococcal vaccine deferral: Deferred  (04/01/2009)  Other Screening   Pap smear: Not documented   Pap smear action/deferral: Deferred  (07/22/2009)    Mammogram: No specific mammographic evidence of malignancy.    (07/19/2008)   Mammogram action/deferral: Deferred  (07/22/2009)   Mammogram due: 07/2009   Smoking status: quit  (07/22/2009)  Diabetes Mellitus   HgbA1C: 8.2  (07/22/2009)   HgbA1C action/deferral: Ordered  (04/01/2009)   Hemoglobin A1C due: 07/02/2009    Eye exam: Not documented   Diabetic eye exam action/deferral: Deferred  (04/01/2009)    Foot exam: yes  (07/22/2009)   Foot exam action/deferral: Do today   High risk foot: Not documented   Foot care education: Not documented   Foot exam due: 06/11/2009    Urine microalbumin/creatinine ratio: 158.7  (12/28/2008)    Diabetes flowsheet reviewed?: Yes   Progress toward A1C goal: Improved  Lipids   Total Cholesterol: 136  (01/15/2009)   Lipid panel action/deferral: Deferred   LDL: 83  (01/15/2009)   LDL Direct: Not documented   HDL: 41  (01/15/2009)   Triglycerides: 61  (01/15/2009)   Lipid panel due: 07/02/2009    SGOT (AST): 16  (04/01/2009)   SGPT (ALT): 14  (04/01/2009)   Alkaline phosphatase: 144  (04/01/2009)   Total bilirubin: 0.5  (04/01/2009)    Lipid flowsheet reviewed?: Yes   Progress toward LDL goal: At goal  Hypertension   Last Blood Pressure: 116 / 81  (07/22/2009)   Serum creatinine: 0.83  (04/01/2009)   BMP action: Ordered   Serum potassium 3.9  (04/01/2009)    Hypertension flowsheet reviewed?: Yes   Progress toward BP  goal: At goal  Self-Management Support :   Personal Goals (by the next clinic visit) :     Personal A1C goal: 7  (04/01/2009)     Personal blood pressure goal: 130/80  (04/01/2009)     Personal LDL goal: 70  (04/01/2009)    Patient will work on the following items until the next clinic visit to reach self-care goals:     Medications and monitoring: take my medicines every day, check my blood sugar, examine my feet every day  (07/22/2009)     Eating: drink diet soda or water instead of juice or soda, eat more vegetables, eat foods that are low in salt, eat baked foods instead of fried foods, eat fruit for snacks and desserts, limit or avoid alcohol  (07/22/2009)     Activity: take a 30 minute walk every day, take the stairs instead of the elevator  (04/01/2009)     Other: checks it when she feels issue /encouraged her to exercise 10 minutes a day  (07/22/2009)     Home glucose monitoring frequency: 2 times a day  (04/01/2009)    Diabetes self-management support: Written self-care plan  (04/01/2009)   Last medical nutrition therapy: 12/11/2007    Hypertension self-management support: Written self-care plan, Education handout  (04/01/2009)    Lipid self-management support: Written self-care plan, Education handout  (04/01/2009)    Nursing Instructions: Gyn referral for screening Pap (see order) Diabetic foot exam today  Laboratory Results   Blood Tests   Date/Time Received: July 22, 2009 2:50 PM  Date/Time Reported: Burke Keels  July 22, 2009 2:50 PM   HGBA1C: 8.2%   (Normal Range: Non-Diabetic - 3-6%   Control Diabetic - 6-8%) CBG Random:: 136mg /dL

## 2010-10-25 NOTE — Progress Notes (Signed)
Summary: med refill/wl  Phone Note Refill Request Message from:  Fax from Pharmacy on April 29, 2007 10:11 AM  Refills Requested: Medication #1:  METFORMIN HCL 1000 MG TABS Take 1 tablet by mouth two times a day   Dosage confirmed as above?Dosage Confirmed   Last Refilled: 03/27/2007   Notes: BMET 02/26/07.  Method Requested: electronic Initial call taken by: Dorene Sorrow RN,  April 29, 2007 10:11 AM  Follow-up for Phone Call        refilled electronically Follow-up by: [name],  April 29, 2007 5:12 PM      Prescriptions: METFORMIN HCL 1000 MG TABS (METFORMIN HCL) Take 1 tablet by mouth two times a day  #62 x 6   Entered and Authorized by:   [name]   Signed by:   [name] on 04/29/2007   Method used:   Electronically sent to ...       [facility] [phone]       [address]       [place], [place]  [address]       Ph: [phone] or [phone]       Fax: [phone]   RxID:   [id number]

## 2010-10-25 NOTE — Assessment & Plan Note (Signed)
Summary: infected toe/gg   Vital Signs:  Patient Profile:   50 Years Old Female Height:     62.5 inches (158.75 cm) Weight:      176.6 pounds (80.27 kg) BMI:     31.90 Temp:     97.3 degrees F (36.28 degrees C) oral Pulse rate:   62 / minute BP sitting:   110 / 76  (left arm)  Pt. in pain?   no  Vitals Entered By: Stanton Kidney Ditzler RN (May 14, 2007 11:11 AM)              Is Patient Diabetic? Yes  Nutritional Status BMI of > 30 = obese Nutritional Status Detail good CBG Result 135  Have you ever been in a relationship where you felt threatened, hurt or afraid?denies   Does patient need assistance? Functional Status Self care Ambulation Normal   PCP:  Antony Contras MD  Chief Complaint:  Past 4 days left big toe sore and swollen.Marland Kitchen  History of Present Illness: Pt is a 50 yo woman with a hx of DM, HtN and HL who comes to the opc with c/o of L big toe swelling.  Last Friday (4 days prior ti visit), noticed soreness and edema while she was in the shower. "Feels like there's pus inside". First episode of such but has had ingrown toenails (never removed before). No trauma but she did clip her toenails a couple of weeks ago. Does her pedicures herself.  Never had diabetic feet infections.  Still able to move her toe and to walk. No discharge from toe. Hasn't needed anything to relieve her pain. Denies fevers and chills. Appetite still good.  Went swimming at a pool on July 5th but wore swim shoes. Did not swim in a lake.  Overall feels it's getting better because it started off being violaceous and is now pink instead.  Current Allergies (reviewed today): ! * DOES NOT KNOW    Risk Factors: Tobacco use:  quit    Year quit:  2006   Review of Systems      See HPI   Physical Exam  General:     alert, well-developed, well-nourished, and well-hydrated. Young appearing woman in NAD. I did not perform a complete physical exam as pt was coming in for toe complaints only.  Msk:     normal ROM, no joint tenderness, no joint warmth, and no joint deformities.  L 1st toe mildly edematous and erythematous. Toenail cut very short and ingrown on both sides. No significant tenderness to palpation of nail or toe itself. No discharge expressed from palpation.  Pulses:     2+ pedal pulses bilaterally. Extremities:     No edema or wounds. No tinea. Neurologic:     gait normal.      Impression & Recommendations:  Problem # 1:  INGROWN TOENAIL (ICD-703.0) No evidence of infection. The edema and erythema are mild and already improving. They are likely 2/2 to the ingrown nail itself. Advised pt to return if worsens (pain, edema, erythema, discharge) or if she develops fevers. If sx persist, she can call us to get a podiatry referral for possible nail removal.  Complete Medication List: 1)  Januvia 100 Mg Tabs (Sitagliptin phosphate) .... Take 1 tablet by mouth once a day 2)  Metformin Hcl 1000 Mg Tabs (Metformin hcl) .... Take 1 tablet by mouth two times a day 3)  Diovan 160 Mg Tabs (Valsartan) .... Take 1 tablet by mouth once a day  4)  Tussin Dm 100-10 Mg/76ml Syrp (Dextromethorphan-guaifenesin) 5)  Claritin 10 Mg Tabs (Loratadine) .... Take 1 tablet by mouth once a day 6)  Glipizide 10 Mg Tabs (Glipizide) .... Take 1 tablet by mouth once a day 7)  Lipitor 20 Mg Tabs (Atorvastatin calcium) .... Take 1 tablet by mouth once a day 8)  Norvasc 10 Mg Tabs (Amlodipine besylate) .... Take 1 tablet by mouth once a day 9)  Metoprolol Tartrate 25 Mg Tabs (Metoprolol tartrate) .... Take one tablet by mouth twice a day  Other Orders: T- Capillary Blood Glucose (82948) T-Hgb A1C (in-house) (04540JW)   Patient Instructions: 1)  Keep an eye on your toenail. If it gets worse in terms of the swelling, redness and pain, come back to see [place].      Vital Signs:  Patient Profile:   50 Years Old Female Height:     62.5 inches (158.75 cm) Weight:      176.6 pounds (80.27 kg) BMI:      31.90 Temp:     97.3 degrees F (36.28 degrees C) oral Pulse rate:   62 / minute BP sitting:   110 / 76             CBG Result 135      Laboratory Results   Blood Tests   Date/Time Recieved: May 14, 2007 11:46 AM  Date/Time Reported: ..................................................................Marland KitchenOren Beckmann  May 14, 2007 11:46 AM   HGBA1C: 7.7%   (Normal Range: Non-Diabetic - 3-6%   Control Diabetic - 6-8%) CBG Random: 135

## 2010-10-25 NOTE — Progress Notes (Signed)
Summary: refill/ hla  Phone Note Refill Request Message from:  Fax from Pharmacy on August 23, 2010 10:58 AM  Refills Requested: Medication #1:  LANTUS SOLOSTAR 100 UNIT/ML  SOLN Inject 35 units into the skin of your abdomen once every night   Dosage confirmed as above?Dosage Confirmed   Last Refilled: 10/8 last visit 12/2009, last labs 03/2009  Initial call taken by: Marin Roberts RN,  August 23, 2010 10:59 AM  Follow-up for Phone Call        Refill approved-nurse to complete Follow-up by: Julaine Fusi  DO,  August 23, 2010 12:33 PM    Prescriptions: LANTUS SOLOSTAR 100 UNIT/ML  SOLN (INSULIN GLARGINE) Inject 35 units into the skin of your abdomen once every night  #1 x 3   Entered and Authorized by:   Julaine Fusi  DO   Signed by:   Julaine Fusi  DO on 08/23/2010   Method used:   Electronically to        [facility] [phone]* (retail)       [address]       [place], [place]  [address]       Ph: [phone]       Fax: [phone]   RxID:   [id number]

## 2010-10-25 NOTE — Progress Notes (Signed)
Summary: REfill/gh  Phone Note Refill Request Message from:  Pharmacy on September 09, 2007 12:38 PM  Refills Requested: Medication #1:  MECLIZINE HCL 25 MG  CHEW Take 1 tablet by mouth two times a day.   Last Refilled: 08/15/2007  Method Requested: Electronic Initial call taken by: Angelina Ok RN,  September 09, 2007 12:39 PM  Follow-up for Phone Call        5 refills on the Nov. refill. ..................................................................Marland KitchenUlyess Mort MD  September 10, 2007 4:25 PM   Additional Follow-up for Phone Call Additional follow up Details #1::        Rx faxed to pharmacy Additional Follow-up by: Angelina Ok RN,  September 11, 2007 9:04 AM      Prescriptions: MECLIZINE HCL 25 MG  CHEW (MECLIZINE HCL) Take 1 tablet by mouth two times a day  #62 x 3   Entered and Authorized by:   Ulyess Mort MD   Signed by:   Ulyess Mort MD on 09/10/2007   Method used:   Electronically [facility] to ...       [facility] [address] [phone]*       [address]       [place], [place]  [address]       Ph: [phone] or [phone]       Fax: [phone]   RxID:   [id number]

## 2010-10-25 NOTE — Progress Notes (Signed)
Summary: refill/gg  Phone Note Refill Request  on December 05, 2007 10:16 AM  Refills Requested: Medication #1:  METFORMIN HCL 1000 MG TABS Take 1 tablet by mouth two times a day  Method Requested: electronic Initial call taken by: Merrie Roof RN,  December 05, 2007 11:29 AM      Prescriptions: METFORMIN HCL 1000 MG TABS (METFORMIN HCL) Take 1 tablet by mouth two times a day  #62 x 11   Entered and Authorized by:   [name]   Signed by:   [name] on 12/05/2007   Method used:   Electronically sent to ...       [address]*       [address]       [place], [place]  [address]       Ph: [phone]       Fax: [phone]   RxID:   [id number]

## 2010-10-25 NOTE — Progress Notes (Signed)
Summary: med refill/gp  Phone Note Refill Request Message from:  Fax from Pharmacy on February 28, 2010 2:31 PM  Refills Requested: Medication #1:  AMLODIPINE BESYLATE 10 MG TABS Take 1 tablet by mouth once a day   Last Refilled: 01/21/2010  Method Requested: Electronic Initial call taken by: Chinita Pester RN,  February 28, 2010 2:31 PM  Follow-up for Phone Call        completed refill, thank you Ammi Hutt  Follow-up by: Mliss Sax MD,  February 28, 2010 10:59 PM    Prescriptions: AMLODIPINE BESYLATE 10 MG TABS (AMLODIPINE BESYLATE) Take 1 tablet by mouth once a day  #30 x 11   Entered and Authorized by:   Mliss Sax MD   Signed by:   Mliss Sax MD on 02/28/2010   Method used:   Electronically to        [facility] [phone]* (retail)       [address]       [place], [place]  [address]       Ph: [phone]       Fax: [phone]   RxID:   [id number]

## 2010-10-25 NOTE — Progress Notes (Signed)
Summary: refill/ hla  Phone Note Refill Request Message from:  Fax from Pharmacy on June 07, 2009 5:15 PM  Refills Requested: Medication #1:  AMLODIPINE BESYLATE 10 MG TABS Take 1 tablet by mouth once a day Initial call taken by: Marin Roberts RN,  June 07, 2009 5:15 PM  Follow-up for Phone Call        completed Follow-up by: Mliss Sax MD,  June 08, 2009 4:25 PM    Prescriptions: AMLODIPINE BESYLATE 10 MG TABS (AMLODIPINE BESYLATE) Take 1 tablet by mouth once a day  #30 x 3   Entered and Authorized by:   Mliss Sax MD   Signed by:   Mliss Sax MD on 06/08/2009   Method used:   Electronically to        [facility] [phone]* (retail)       [address]       [place], [place]  [address]       Ph: [phone]       Fax: [phone]   RxID:   [id number]

## 2010-10-25 NOTE — Assessment & Plan Note (Signed)
Summary: lower back pain x 2-3 weeks/pcp-ortiz/hla   Vital Signs:  Patient profile:   Teresa Grant Height:      62.5 inches (158.75 cm) Weight:      174.3 pounds (79.23 kg) BMI:     31.49 Temp:     97.8 degrees F (36.56 degrees C) oral Pulse rate:   60 / minute BP sitting:   113 / 75  (right arm)  Vitals Entered By: Filomena Jungling NT II (December 10, 2008 10:09 AM) CC: ROUTINE CHECK UP / STILL HAVING A COUGH - WHY / BACK PAIN AND CHEST HURTSX1MONTH Is Patient Diabetic? Yes  Pain Assessment Patient in pain? yes     Location: BACK,CHEST Intensity: 6 Type: aching Onset of pain  Gradual Nutritional Status BMI of > 30 = obese CBG Result 103  Have you ever been in a relationship where you felt threatened, hurt or afraid?No   Does patient need assistance? Functional Status Self care Ambulation Normal   Primary Care Provider:  [name]  CC:  ROUTINE CHECK UP / STILL HAVING A COUGH - WHY / BACK PAIN AND CHEST HURTSX1MONTH.  History of Present Illness: 50 y/o with PMH of HTN and DM comes in for lower back for a month. LMP 6 years. She has not taken anything for the pain.  She relates the pain is not made better by anything, movement makes it worst aslo standing to long make it worst. She relates parastesia on her R thigh, but no pain. She relate no fever or chills. She relates urinating more than usual, no dysuria, no vaginal itching or discharge. no incontinence. denies anorexia, N/V, diarhea or change in bowel habit. She denies melena or BRBPR.  Preventive Screening-Counseling & Management     Smoking Status: quit     Year Quit: 2006     Does Patient Exercise: no     Times/week: 0  Current Medications (verified): 1)  Metformin Hcl 1000 Mg Tabs (Metformin Hcl) .... Take 1 Tablet By Mouth Two Times A Day 2)  Glipizide 10 Mg  Tabs (Glipizide) .... Take 1 Tablet By Mouth Once A Day 3)  Pravachol 40 Mg Tabs (Pravastatin Sodium) .... Take 2 Tablet By Mouth Once A  Day 4)  Toprol Xl 100 Mg Xr24h-Tab (Metoprolol Succinate) .... Take 1 Tablet By Mouth Once A Day 5)  Lantus Solostar 100 Unit/ml  Soln (Insulin Glargine) .... Inject 35 Units Into The Skin of Your Abdomen Once Every Night 6)  Bd U/f Short Pen Needle 31g X 8 Mm  Misc (Insulin Pen Needle) .... Use To Inject Your Insulin 7)  Freestyle Test   Strp (Glucose Blood) .... Use As Directed. 8)  Amlodipine Besylate 10 Mg Tabs (Amlodipine Besylate) .... Take 1 Tablet By Mouth Once A Day 9)  Cozaar Teresa Mg Tabs (Losartan Potassium) .... Take 1 Tablet By Mouth Once A Day  Allergies (verified): 1)  ! Hydrochlorothiazide 2)  ! * Lisnopril  Review of Systems  The patient denies anorexia, fever, weight gain, vision loss, decreased hearing, hoarseness, chest pain, syncope, dyspnea on exertion, prolonged cough, headaches, hemoptysis, abdominal pain, melena, severe indigestion/heartburn, hematuria, genital sores, muscle weakness, suspicious skin lesions, difficulty walking, unusual weight change, abnormal bleeding, enlarged lymph nodes, and breast masses.    Physical Exam  General:  Well-developed,well-nourished,in no acute distress; alert,appropriate and cooperative throughout examination Lungs:  Normal respiratory effort, chest expands symmetrically. Lungs are clear to auscultation, no crackles or wheezes. Heart:  Normal rate and regular rhythm. S1 and S2 normal without gallop, murmur, click, rub or other extra sounds. Abdomen:  soft, no distention, no masses, no guarding, no rigidity, no rebound tenderness, and RLQ tenderness.  No CVA tenderness. Genitalia:  differed. Msk:  No deformity or scoliosis noted of thoracic or lumbar spine.   Extremities:  No clubbing, cyanosis, edema, or deformity noted with normal full range of motion of all joints.   Neurologic:  alert & oriented X3, cranial nerves II-XII intact, strength normal in all extremities, sensation intact to light touch, sensation intact to pinprick, and  gait normal.     Impression & Recommendations:  Problem # 1:  ABDOMINAL PAIN RIGHT LOWER QUADRANT (ICD-789.03) 1 month of back/abdominal pain.  Concern for stone as dipstick showed blood, pt is not in riding pain will treat with tylenol. She has no fever, dipstick negativew for WBC. Also ovarian cyst and or malignancy.  Will repeat a urine analysis and send for culture if positive. Will get and Ultrsound to r/o any cystic lesion  Orders: Ultrasound (Ultrasound) T-Urinalysis ([id number]) T-Comprehensive Metabolic Panel ([id number])  Complete Medication List: 1)  Metformin Hcl 1000 Mg Tabs (Metformin hcl) .... Take 1 tablet by mouth two times a day 2)  Glipizide 10 Mg Tabs (Glipizide) .... Take 1 tablet by mouth once a day 3)  Pravachol 40 Mg Tabs (Pravastatin sodium) .... Take 2 tablet by mouth once a day 4)  Toprol Xl 100 Mg Xr24h-tab (Metoprolol succinate) .... Take 1 tablet by mouth once a day 5)  Lantus Solostar 100 Unit/ml Soln (Insulin glargine) .... Inject 35 units into the skin of your abdomen once every night 6)  Bd U/f Short Pen Needle 31g X 8 Mm Misc (Insulin pen needle) .... Use to inject your insulin 7)  Freestyle Test Strp (Glucose blood) .... Use as directed. 8)  Amlodipine Besylate 10 Mg Tabs (Amlodipine besylate) .... Take 1 tablet by mouth once a day 9)  Cozaar Teresa Mg Tabs (Losartan potassium) .... Take 1 tablet by mouth once a day 10)  Tylenol 8 Hour 650 Mg Cr-tabs (Acetaminophen) .... Take 2 tablet by mouth three times a day as needed  Other Orders: T- Capillary Blood Glucose ([id number]) T-Hgb A1C (in-house) ([id number])  Patient Instructions: 1)  Please schedule a follow-up appointment in 2 weeks. 2)  Will call with result of ultrasound.  3)  Come back to the out patient clinic if the pain get's worst. Prescriptions: TYLENOL 8 HOUR 650 MG CR-TABS (ACETAMINOPHEN) Take 2 tablet by mouth three times a day as needed  #30 x 0   Entered and Authorized by:   [name]   Signed by:   [name] on 12/10/2008   Method used:   Electronically to        [facility] [phone]* (retail)       [address]       [place], [place]  [address]       Ph: [phone] or [phone]       Fax: [phone]   RxID:   [id number]   Laboratory Results   Blood Tests   Date/Time Received: December 10, 2008 10:36 AM  Date/Time Reported: Hoy Morn SMA  December 10, 2008 10:36 AM   HGBA1C: 7.0%   (Normal Range: Non-Diabetic - 3-6%   Control Diabetic - 6-8%) CBG Random:: 103mg /dL

## 2010-10-25 NOTE — Progress Notes (Signed)
  Phone Note Outgoing Call   Call placed by: Theotis Barrio,  August 20, 2007 4:50 PM Call placed to: Patient Summary of Call: CALLED PATIENT AND LEFT VOICE MESSAGE WITH APPT INFORMATION, [facility] / DR Anne Hahn / Sep 03, 2007 @10 :OOAM TO ARRIVE AT LEAST 15 MIN EARLY. LELA STURDIVANT NTII

## 2010-10-25 NOTE — Progress Notes (Signed)
Summary: refill/ hla  Phone Note Refill Request Message from:  Fax from Pharmacy on December 28, 2008 1:57 PM  Refills Requested: Medication #1:  LANTUS SOLOSTAR 100 UNIT/ML  SOLN Inject 35 units into the skin of your abdomen once every night   Last Refilled: 2/10 Initial call taken by: Marin Roberts RN,  December 28, 2008 1:57 PM      Prescriptions: LANTUS SOLOSTAR 100 UNIT/ML  SOLN (INSULIN GLARGINE) Inject 35 units into the skin of your abdomen once every night  #1 mo supply x 11   Entered and Authorized by:   [name]   Signed by:   [name] on 12/28/2008   Method used:   Electronically to        [facility] [phone]* (retail)       [address]       [place], [place]  [address]       Ph: [phone]       Fax: [phone]   RxID:   [id number]

## 2010-10-25 NOTE — Progress Notes (Signed)
Summary: refill/ hla  Phone Note Refill Request Message from:  Fax from Pharmacy on June 03, 2007 11:41 AM  Refills Requested: Medication #1:  JANUVIA 100 MG TABS Take 1 tablet by mouth once a day   Last Refilled: 8/12  Medication #2:  DIOVAN 160 MG TABS Take 1 tablet by mouth once a day   Last Refilled: 8/11 Initial call taken by: Marin Roberts RN,  June 03, 2007 11:42 AM  Follow-up for Phone Call        Done. Follow-up by: Ned Grace MD,  June 03, 2007 3:28 PM  Additional Follow-up for Phone Call Additional follow up Details #1::        done too! Additional Follow-up by: Marin Roberts RN,  June 04, 2007 1:50 PM      Prescriptions: DIOVAN 160 MG TABS (VALSARTAN) Take 1 tablet by mouth once a day  #31 x 5   Entered and Authorized by:   Ned Grace MD   Signed by:   Ned Grace MD on 06/03/2007   Method used:   [facility]ron[facility] to ...       [facility] [address] [phone]*       [address]       [place], [place]  [address]       Ph: [phone] or [phone]       Fax: [phone]   RxID:   [id number] JANUVIA 100 MG TABS (SITAGLIPTIN PHOSPHATE) Take 1 tablet by mouth once a day  #31 x 5   Entered and Authorized by:   Ned Grace MD   Signed by:   Ned Grace MD on 06/03/2007   Method used:   Electronically sent to ...       [facility] [address] [phone]*       [address]       [place], [place]  [address]       Ph: [phone] or [phone]       Fax: [phone]   RxID:   [id number]

## 2010-10-25 NOTE — Progress Notes (Signed)
Summary: refill/gg  Phone Note Refill Request  on March 10, 2008 10:01 AM  Refills Requested: Medication #1:  GLIPIZIDE 10 MG  TABS Take 1 tablet by mouth once a day   Last Refilled: 03/05/2008  Method Requested: electronic Initial call taken by: Merrie Roof RN,  March 10, 2008 10:02 AM      Prescriptions: GLIPIZIDE 10 MG  TABS (GLIPIZIDE) Take 1 tablet by mouth once a day  #31 x 11   Entered and Authorized by:   [name]   Signed by:   [name] on 03/11/2008   Method used:   Electronically sent to ...       [address]*       [address]       [place], [place]  [address]       Ph: [phone]       Fax: [phone]   RxID:   [id number]

## 2010-10-25 NOTE — Progress Notes (Signed)
Summary: phone/gg  Phone Note Call from Patient   Caller: Patient Summary of Call: Pt called with c/o chest pain when she takes a deep breath or turns.  She has burning when she drinks and feels like something is in throat.  Onset 4 days ago.  Feels tired alot. denies SOB, diaphoresis or dizziness.  Pt is at work now and will come into clinic at 2:00    Initial call taken by: Merrie Roof RN,  January 05, 2010 9:26 AM  Follow-up for Phone Call        Agree with appt.  Pt to call 911 if signs progress,change, or become more worrisome. Follow-up by: Blanch Media MD,  January 05, 2010 9:48 AM

## 2010-10-25 NOTE — Progress Notes (Signed)
Summary: med refill/wl  Phone Note Refill Request Message from:  Fax from Pharmacy on November 08, 2006 12:40 PM  Refills Requested: Medication #1:  Diovan 160mg  #30 Take one tablet daily   Last Refilled: 06/09/2006   Notes: last office visit 08/01/06 - Diovan not listed.  Please clarify dosage 80mg  or 160mg .  Method Requested: Fax to Local Pharmacy Initial call taken by: Dorene Sorrow RN,  November 08, 2006 12:09 PM  Follow-up for Phone Call        Refill approved-nurse to complete  Diovan 160mg  once daily.  #31 with 1 refill. Follow-up by: Ulyess Mort MD,  November 08, 2006 12:23 PM  Additional Follow-up for Phone Call Additional follow up Details #1::        Rx called to pharmacy Additional Follow-up by: Dorene Sorrow RN,  November 08, 2006 12:43 PM

## 2010-10-25 NOTE — Assessment & Plan Note (Signed)
Summary: dm teaching/cfb              CBG Device ID recommended [employer] meter       Current Allergies: ! 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 No  Monitoring Self monitoring blood glucose 1 time a day Name of Meter  recommended [employer] meter      Time of Testing  Before Breakfast  Recent Episodes of: DKA: No Hyperglycemia : Yes Hypoglycemia: No HHNK: No Severe Hypoglycemia : No     Estimated /Usual Carb Intake Breakfast # of Carbs/Grams skips most of the time Lunch # of Carbs/Grams skips most of the time Dinner # of Carbs/Grams eats most of food at [employer] after 6PM  Nutrition assessment What beverages do you drink?  sweet tea and regular soda   Activity Limitations  Inadequate physical activity  Diabetes Disease Process Define diabetes in simple terms: Needs review/assistance State own type of diabetes: Demonstrates competency State diabetes is treated by meal plan-exercise-medication-monitoring-education: Needs review/assistance  Medications State name-action-dose-duration-side effects-and time to take medication: Needs review/assistance State appropriate timing of food related to medication: Needs review/assistance Demonstrates/verbalizes site selection and rotation for injections Needs review/assistance Correctly draw up and administer insulin-Byetta-Symlin-glucagon: Needs review/assistance State diabetes medication adjustments for sick days: Needs  review/assistance State insulin adjustment guidelines: Demonstrates competency  Describe safe needle/lancet disposal: Demonstrates competency  Nutritional Management Identify what foods most often affect blood glucose: Needs review/assistance Verbalize importance of controlling food portions: Demonstrates competency State importance of spacing and not omitting meals and snacks: Needs review/assistance State changes planned for home meals/snacks: Needs review/assistance Nutrition Education  Done: 12/11/2007  Monitoring State purpose and frequency of monitoring BG-ketones-HgbA1C and when to contact health care team with results: Needs review/assistance Perform glucose monitoring/ketone testing and record results correctly: Needs review/assistance State target blood glucose and HgbA1C goals: Needs review/assistance  Complications State the causes-signs and symptoms and prevention of Hyperglycemia: Needs review/assistance Explain proper treatment of hyperglycemia: Needs review/assistance State the causes- signs and symptoms and prevention of hypoglycemia: Needs review/assistance Explain proper treatment of hypoglycemia: Needs review/assistance State sick day guidelines and when to contact health care team: Needs review/assistance State the relationship between blood glucose control and the development/prevention of long-term complications: Demonstrates competency State benefits-risks-and options for improving blood sugar control: Needs review/assistance State the relationship between blood pressure and lipid control in the prevention/control of cardiovascular disease: Needs review/assistance State the principles of skin-dental and foot care: Needs review/assistance Describe symptoms of skin and foot problems and describe foot exam: Needs review/assistance State when to seek medical advice and treatment: Needs review/assistance  Exercise States importance of exercise: Needs  review/assistance States effect of exercise on blood glucose: Demonstrates competency Verbalizes safety measures for exercise related to diabetes: Needs review/assistance  Lifestyle changes:Goal setting and Problem solving Identify lifestyle behaviors that need to change: Demonstrates competency        said she knew what she needed to do and about carbs, but didn't know how her  mediction worked or have a basic understanding fo what Type 2 diabetes is or know that fruit and milk have carbs. Question if she is still not accepting of her diabetes diagnosis and ready to learn about it.  Last LDL:                                                 75 (10/03/2007 10:34:00 PM)

## 2010-10-25 NOTE — Assessment & Plan Note (Signed)
Summary: throat and chest pain/gg   see note   Vital Signs:  Patient profile:   50 year old female Height:      62.5 inches (158.75 cm) Weight:      169.1 pounds (76.86 kg) BMI:     30.55 Temp:     97.4 degrees F (36.33 degrees C) oral Pulse rate:   87 / minute BP sitting:   112 / 78  (right arm) Cuff size:   large  Vitals Entered By: Theotis Barrio NT II (January 05, 2010 2:41 PM) CC: COUGH STARTED THIS A.M. / "SCRATCHY " THROAT  /  CHEST BONE AREA AND RIGHT RIB AREA SONRENESS FOR ABOUT 2  WEEKS Is Patient Diabetic? Yes Did you bring your meter with you today? SYSTEM DOWN Pain Assessment Patient in pain? no      Nutritional Status BMI of > 30 = obese  Have you ever been in a relationship where you felt threatened, hurt or afraid?No   Does patient need assistance? Functional Status Self care Ambulation Normal Comments COUGH STARTED THIS A.M. / "SCRATCHY" THROAT / CHEST BONE AREA AND RIGHT RIB AREA SORENESS FOR ABOUT 2 [id number]   Primary Care Provider:  Mliss Sax MD  CC:  COUGH STARTED THIS A.M. / "SCRATCHY " THROAT  /  CHEST BONE AREA AND RIGHT RIB AREA SONRENESS FOR ABOUT 2  WEEKS.  History of Present Illness: 50 yr old female with Past Medical History: Diabetes mellitus, type II Hyperlipidemia Hypertension Seasonal allergies  presents with complain of scratchy throat and some midsternal chest pain as well as right sided rib cage pain. The rib cage pain is ongoing for two weeks and only evident when she stretches. Her sternal pain started later and not related ot exertion. she describes it as sharp and related to coughing at times. No other symptoms associated i.e. diaphoresis.  She has scratchy throat for 3 dyas which is getting worse. no nasal dischrge, no fevers, no nasal stuffiness. She had a grandson contact over weekend who also is sick with "allergies".   Preventive Screening-Counseling & Management  Alcohol-Tobacco     Smoking Status: quit     Year Quit:  2006  Caffeine-Diet-Exercise     Does Patient Exercise: no     Times/week: 0  Current Medications (verified): 1)  Metformin Hcl 1000 Mg Tabs (Metformin Hcl) .... Take 1 Tablet By Mouth Two Times A Day 2)  Glipizide 5 Mg Tabs (Glipizide) .... Take 1 Tablet By Mouth Once in The Morning 3)  Pravachol 40 Mg Tabs (Pravastatin Sodium) .... Take 2 Tablet By Mouth Once A Day 4)  Toprol Xl 100 Mg Xr24h-Tab (Metoprolol Succinate) .... Take 1 Tablet By Mouth Once A Day 5)  Lantus Solostar 100 Unit/ml  Soln (Insulin Glargine) .... Inject 35 Units Into The Skin of Your Abdomen Once Every Night 6)  Bd U/f Short Pen Needle 31g X 8 Mm  Misc (Insulin Pen Needle) .... Use To Inject Your Insulin 7)  Freestyle Test   Strp (Glucose Blood) .... Use As Directed. 8)  Amlodipine Besylate 10 Mg Tabs (Amlodipine Besylate) .... Take 1 Tablet By Mouth Once A Day 9)  Cozaar 50 Mg Tabs (Losartan Potassium) .... Take 1 Tablet By Mouth Once A Day 10)  Flexeril 5 Mg Tabs (Cyclobenzaprine Hcl) .... Take 1 Tab By Mouth At Bedtime  Allergies (verified): 1)  ! Hydrochlorothiazide 2)  ! * Lisnopril  Past History:  Past Medical History: Last updated: 03/07/2007 Diabetes mellitus,  type II Hyperlipidemia Hypertension Seasonal allergies  Past Surgical History: Last updated: 01/23/2008 Hysterectomy 2001-? but thinks fibroids.  Social History: Last updated: 01/23/2008 No smoking, no etoh, or illegal drugs.  Married, works  driving a school bus.  Risk Factors: Exercise: no (01/05/2010)  Risk Factors: Smoking Status: quit (01/05/2010)  Review of Systems      See HPI  Physical Exam  General:  alert, well-developed, well-nourished, well-hydrated, and overweight-appearing.   Head:  no abnormalities observed.   Eyes:  vision grossly intact, pupils equal, pupils round, and pupils reactive to light.   Ears:  no external deformities.   Nose:  no external erythema.   Mouth:  moderate erythema, no discharge, no  tonsil enlargmeent.  Neck:  No deformities, masses, or tenderness noted. Lungs:  Normal respiratory effort, chest expands symmetrically. Lungs are clear to auscultation, no crackles or wheezes. Heart:  Normal rate and regular rhythm. S1 and S2 normal without gallop, murmur, click, rub or other extra sounds. Abdomen:  Bowel sounds positive,abdomen soft and non-tender without masses, organomegaly or hernias noted. Msk:  No deformity or scoliosis noted of thoracic or lumbar spine.   Extremities:  No clubbing, cyanosis, edema, or deformity noted with normal full range of motion of all joints.   Skin:  Intact without suspicious lesions or rashes Psych:  Cognition and judgment appear intact. Alert and cooperative with normal attention span and concentration. No apparent delusions, illusions, hallucinations   Impression & Recommendations:  Problem # 1:  SORE THROAT (ICD-462) Pt likely has sore throat from viral or allergic reaction. However she feels she is getting worse. I will give her antibotic prescription and instructed her not to take it unless she gets worse by the weekend. Pt voices understanding.  Her updated medication list for this problem includes:    Amoxicillin 500 Mg Caps (Amoxicillin) .Marland Kitchen... Take 1 tablet by mouth two times a day  Problem # 2:  MYALGIA (ICD-729.1) Her rib pain and chest pain appears to be simple myalgia exaceberated by coughing. I would give her ibuprofen 400 mg three times a day for pain control over next week.  The following medications were removed from the medication list:    Flexeril 5 Mg Tabs (Cyclobenzaprine hcl) .Marland Kitchen... Take 1 tab by mouth at bedtime  Problem # 3:  HYPERTENSION (ICD-401.9) Pt apperently not taking cozaar and it still exist on med list. I am unclear and when and why it was stopped. Since her BP is normal I will remove it from her med list. I will leave it up to pcp whether they would want to reintroduce it instead of norvasc.  The following  medications were removed from the medication list:    Cozaar 50 Mg Tabs (Losartan potassium) .Marland Kitchen... Take 1 tablet by mouth once a day Her updated medication list for this problem includes:    Toprol Xl 100 Mg Xr24h-tab (Metoprolol succinate) .Marland Kitchen... Take 1 tablet by mouth once a day    Amlodipine Besylate 10 Mg Tabs (Amlodipine besylate) .Marland Kitchen... Take 1 tablet by mouth once a day  Problem # 4:  DIABETES MELLITUS, TYPE II (ICD-250.00) Needs A1C. She says she is doing better with most of her CBG read 120 to 150 but she does not have meter with her. This needs to be addressed on next visit. Also ACE/ARB issue needs to be looked into.  The following medications were removed from the medication list:    Cozaar 50 Mg Tabs (Losartan potassium) .Marland Kitchen... Take 1 tablet  by mouth once a day Her updated medication list for this problem includes:    Metformin Hcl 1000 Mg Tabs (Metformin hcl) .Marland Kitchen... Take 1 tablet by mouth two times a day    Glipizide 5 Mg Tabs (Glipizide) .Marland Kitchen... Take 1 tablet by mouth once in the morning    Lantus Solostar 100 Unit/ml Soln (Insulin glargine) ..... Inject 35 units into the skin of your abdomen once every night  Labs Reviewed: Creat: 0.83 (04/01/2009)    Reviewed HgBA1c results: 8.2 (07/22/2009)  9.2 (04/01/2009)  Problem # 5:  HYPERLIPIDEMIA (ICD-272.4) I do not think her myalgia is related to statin use. however, It remains possible. Continue to monitor.  Her updated medication list for this problem includes:    Pravachol 40 Mg Tabs (Pravastatin sodium) .Marland Kitchen... Take 2 tablet by mouth once a day  Labs Reviewed: SGOT: 16 (04/01/2009)   SGPT: 14 (04/01/2009)   HDL:41 (01/15/2009), 43 (05/28/2008)  LDL:83 (01/15/2009), 87 (05/28/2008)  Chol:136 (01/15/2009), 151 (05/28/2008)  Trig:61 (01/15/2009), 105 (05/28/2008)  Complete Medication List: 1)  Metformin Hcl 1000 Mg Tabs (Metformin hcl) .... Take 1 tablet by mouth two times a day 2)  Glipizide 5 Mg Tabs (Glipizide) .... Take 1 tablet  by mouth once in the morning 3)  Pravachol 40 Mg Tabs (Pravastatin sodium) .... Take 2 tablet by mouth once a day 4)  Toprol Xl 100 Mg Xr24h-tab (Metoprolol succinate) .... Take 1 tablet by mouth once a day 5)  Lantus Solostar 100 Unit/ml Soln (Insulin glargine) .... Inject 35 units into the skin of your abdomen once every night 6)  Bd U/f Short Pen Needle 31g X 8 Mm Misc (Insulin pen needle) .... Use to inject your insulin 7)  Freestyle Test Strp (Glucose blood) .... Use as directed. 8)  Amlodipine Besylate 10 Mg Tabs (Amlodipine besylate) .... Take 1 tablet by mouth once a day 9)  Amoxicillin 500 Mg Caps (Amoxicillin) .... Take 1 tablet by mouth two times a day 10)  Loratadine 10 Mg Tabs (Loratadine) .... Take 1 tablet by mouth once a day  Patient Instructions: 1)  Please schedule a follow-up appointment in 3 months. 2)  If you dont get better with antibiotics call us back.  Prescriptions: AMOXICILLIN 500 MG CAPS (AMOXICILLIN) Take 1 tablet by mouth two times a day  #14 x 0   Entered and Authorized by:   Clerance Lav MD   Signed by:   Clerance Lav MD on 01/05/2010   Method used:   Electronically to        [facility] [phone]* (retail)       [address]       [place], [place]  [address]       Ph: [phone]       Fax: [phone]   RxID:   [id number] COZAAR 50 MG TABS (LOSARTAN POTASSIUM) Take 1 tablet by mouth once a day  #31 x 11   Entered and Authorized by:   Clerance Lav MD   Signed by:   Clerance Lav MD on 01/05/2010   Method used:   Electronically to        [facility] [phone]* (retail)       [address]       [place], [place]  [address]       Ph: [phone]       Fax: [phone]   RxID:   [id number]    Prevention & Chronic Care Immunizations   Influenza vaccine: Fluvax  Non-MCR  (06/11/2008)   Influenza vaccine deferral: Deferred  (04/01/2009)   Influenza vaccine due: 05/26/2009    Tetanus booster: Not documented   Tetanus booster  due: 04/02/2019    Pneumococcal vaccine: Not documented   Pneumococcal vaccine deferral: Deferred  (04/01/2009)  Other Screening   Pap smear: Not documented   Pap smear action/deferral: Deferred  (07/22/2009)    Mammogram: No specific mammographic evidence of malignancy.    (07/19/2008)   Mammogram action/deferral: Deferred  (07/22/2009)   Mammogram due: 07/2009   Smoking status: quit  (01/05/2010)  Diabetes Mellitus   HgbA1C: 8.2  (07/22/2009)   HgbA1C action/deferral: Ordered  (04/01/2009)   Hemoglobin A1C due: 07/02/2009    Eye exam: Not documented   Diabetic eye exam action/deferral: Deferred  (04/01/2009)    Foot exam: yes  (07/22/2009)   Foot exam action/deferral: Do today   High risk foot: Not documented   Foot care education: Not documented   Foot exam due: 06/11/2009    Urine microalbumin/creatinine ratio: 158.7  (12/28/2008)  Lipids   Total Cholesterol: 136  (01/15/2009)   Lipid panel action/deferral: Deferred   LDL: 83  (01/15/2009)   LDL Direct: Not documented   HDL: 41  (01/15/2009)   Triglycerides: 61  (01/15/2009)   Lipid panel due: 07/02/2009    SGOT (AST): 16  (04/01/2009)   SGPT (ALT): 14  (04/01/2009)   Alkaline phosphatase: 144  (04/01/2009)   Total bilirubin: 0.5  (04/01/2009)  Hypertension   Last Blood Pressure: 112 / 78  (01/05/2010)   Serum creatinine: 0.83  (04/01/2009)   BMP action: Ordered   Serum potassium 3.9  (04/01/2009)  Self-Management Support :   Personal Goals (by the next clinic visit) :     Personal A1C goal: 7  (04/01/2009)     Personal blood pressure goal: 130/80  (04/01/2009)     Personal LDL goal: 70  (04/01/2009)    Patient will work on the following items until the next clinic visit to reach self-care goals:     Medications and monitoring: take my medicines every day, check my blood sugar, bring all of my medications to every visit, examine my feet every day  (01/05/2010)     Eating: drink diet soda or water  instead of juice or soda, eat more vegetables, use fresh or frozen vegetables, eat baked foods instead of fried foods, eat fruit for snacks and desserts, limit or avoid alcohol  (01/05/2010)     Activity: take the stairs instead of the elevator  (01/05/2010)     Other: checks it when she feels issue /encouraged her to exercise 10 minutes a day  (07/22/2009)     Home glucose monitoring frequency: 2 times a day  (04/01/2009)    Diabetes self-management support: Resources for patients handout  (01/05/2010)   Last medical nutrition therapy: 12/11/2007    Hypertension self-management support: Resources for patients handout  (01/05/2010)    Lipid self-management support: Resources for patients handout  (01/05/2010)        Resource handout printed.

## 2010-10-25 NOTE — Progress Notes (Signed)
Summary: med refill/gp  Phone Note Refill Request Message from:  Fax from Pharmacy on August 03, 2010 2:50 PM  Refills Requested: Medication #1:  PRAVACHOL 40 MG TABS Take 2 tablet by mouth once a day Last appt. 01/05/10.   Method Requested: Electronic Initial call taken by: Chinita Pester RN,  August 03, 2010 2:51 PM    Prescriptions: PRAVACHOL 40 MG TABS (PRAVASTATIN SODIUM) Take 2 tablet by mouth once a day  #62 x 11   Entered and Authorized by:   Ulyess Mort MD   Signed by:   Ulyess Mort MD on 08/03/2010   Method used:   Electronically to        [facility] [phone]* (retail)       [address]       [place], [place]  [address]       Ph: [phone]       Fax: [phone]   RxID:   [id number]

## 2010-10-25 NOTE — Consult Note (Signed)
Summary: GI-Dr. Arlyce Dice  GI-Dr. Arlyce Dice   Imported By: Dorice Lamas 12/13/2006 14:36:34  _____________________________________________________________________  External Attachment:    Type:   Image     Comment:   External Document

## 2010-10-25 NOTE — Progress Notes (Signed)
Summary: refill/ hla  Phone Note Refill Request Message from:  Fax from Pharmacy on December 27, 2006 10:40 AM  Refills Requested: Medication #1:  diovan 160mg  Take 1 tablet by mouth daily not preloaded  Initial call taken by: Marin Roberts RN,  December 27, 2006 10:43 AM  Follow-up for Phone Call        Refill approved-nurse to complete.  Needs BMET before further refills.   Follow-up by: Manning Charity MD,  December 27, 2006 11:31 AM  Additional Follow-up for Phone Call Additional follow up Details #1::        Rx faxed to pharmacy. flag to [id number] Additional Follow-up by: Marin Roberts RN,  December 27, 2006 12:20 PM  New Problems: AFTERCARE, LONG-TERM USE, MEDICATIONS NEC (ICD-V58.69) New/Updated Medications: DIOVAN 160 MG TABS (VALSARTAN) Take 1 tablet by mouth once a day  New Problems: AFTERCARE, LONG-TERM USE, MEDICATIONS NEC (ICD-V58.69) New/Updated Medications: DIOVAN 160 MG TABS (VALSARTAN) Take 1 tablet by mouth once a day

## 2010-10-25 NOTE — Miscellaneous (Signed)
Summary: [facility]  [facility]   Imported By: Florinda Marker 11/13/2008 14:20:41  _____________________________________________________________________  External Attachment:    Type:   Image     Comment:   External Document

## 2010-10-25 NOTE — Assessment & Plan Note (Signed)
Summary: CHECKUP/ SB.   Vital Signs:  Patient Profile:   50 Years Old Female Height:     62.5 inches (158.75 cm) Weight:      178.5 pounds (81.14 kg) BMI:     32.24 Temp:     97.3 degrees F (36.28 degrees C) oral Pulse rate:   67 / minute BP sitting:   113 / 77  (right arm) Cuff size:   regular  Pt. in pain?   no  Vitals Entered By: Theotis Barrio NT II (August 19, 2008 3:22 PM)              Is Patient Diabetic? Yes Did you bring your meter with you today? No Nutritional Status BMI of > 30 = obese  Have you ever been in a relationship where you felt threatened, hurt or afraid?No   Does patient need assistance? Functional Status Self care Ambulation Normal     PCP:  [name]  Chief Complaint:  ROUTINE CHECK UP / STILL HAVING A COUGH - WHY .  History of Present Illness: 50 y/o with PMH of DM and HTN comes in for persistent Cough. The cough started several months ago and it has been persitent and chronic. She gets a tickling sensation in the back of her throat then goes into a coughing spells. She denies wheezing during these episodes. Nothing has helped the cough and it resolves acutely on it own but returns-worse at night. She has only been treated for allergic rhinitis. She has been unable to identify a trigger.   No fevers, chills, NVD. No adenopathy or recent weight loss.  No lung disease in her family. She relates no travel history an no sick contacts.    Prior Medication List:  METFORMIN HCL 1000 MG TABS (METFORMIN HCL) Take 1 tablet by mouth two times a day GLIPIZIDE 10 MG  TABS (GLIPIZIDE) Take 1 tablet by mouth once a day PRAVACHOL 40 MG TABS (PRAVASTATIN SODIUM) Take 2 tablet by mouth once a day TOPROL XL 100 MG XR24H-TAB (METOPROLOL SUCCINATE) Take 1 tablet by mouth once a day LISINOPRIL 20 MG  TABS (LISINOPRIL) Take 1 tablet by mouth once a day LANTUS SOLOSTAR 100 UNIT/ML  SOLN (INSULIN GLARGINE) Inject 30 units into the skin of your abdomen  once every night BD U/F SHORT PEN NEEDLE 31G X 8 MM  MISC (INSULIN PEN NEEDLE) Use to inject your insulin FREESTYLE TEST   STRP (GLUCOSE BLOOD) Use as directed. AMLODIPINE BESYLATE 10 MG TABS (AMLODIPINE BESYLATE) Take 1 tablet by mouth once a day FEXOFENADINE HCL 180 MG TABS (FEXOFENADINE HCL) Take 1 tablet by mouth once a day for allergies PROVENTIL HFA 108 (90 BASE) MCG/ACT AERS (ALBUTEROL SULFATE) Take 2 puffs every 4 hours as needed for cough or wheezing FLUNISOLIDE 29 MCG/ACT SOLN (FLUNISOLIDE) Use 2 sprays in each nostril twice daily SIMPLY SALINE 0.9 % AERS (SALINE) Use as directed two times a day FAMOTIDINE 20 MG TABS (FAMOTIDINE) Take 1 tablet by mouth once a day at bedtime   Current Allergies: ! HYDROCHLOROTHIAZIDE ! * LISNOPRIL    Risk Factors: Tobacco use:  quit    Year quit:  2006 Alcohol use:  no Exercise:  no Seatbelt use:  100 %  Family History Risk Factors:    Family History of MI in females < 50 years old:  no    Family History of MI in males < 60 years old:  no  Mammogram History:    Date of Last  Mammogram:  07/19/2008   Review of Systems  The patient denies anorexia, fever, weight loss, hoarseness, syncope, dyspnea on exertion, peripheral edema, headaches, hemoptysis, melena, severe indigestion/heartburn, incontinence, and muscle weakness.     Physical Exam  General:     Well-developed,well-nourished,in no acute distress; alert,appropriate and cooperative throughout examination Lungs:     Normal respiratory effort, chest expands symmetrically. Lungs are clear to auscultation, no crackles or wheezes. Heart:     Normal rate and regular rhythm. S1 and S2 normal without gallop, murmur, click, rub or other extra sounds.    Impression & Recommendations:  Problem # 1:  COUGH (ICD-786.2) This is now persistent for 3 month, treatment for allergic rhinitis and asthma have not worked.  This is most likely secondarly to lisinopril. I will go ahead and start  ARB (cozaar). I will see her in 3 weeks.  Problem # 2:  DIABETES MELLITUS, TYPE II (ICD-250.00) Good control will increase your lantus 35, to achieve HBA1c <7.0. I will check a HbgA1c. Uptodate on follow screening for DM. Will see her in 3 month. The following medications were removed from the medication list:    Lisinopril 20 Mg Tabs (Lisinopril) .Marland Kitchen... Take 1 tablet by mouth once a day  Her updated medication list for this problem includes:    Metformin Hcl 1000 Mg Tabs (Metformin hcl) .Marland Kitchen... Take 1 tablet by mouth two times a day    Glipizide 10 Mg Tabs (Glipizide) .Marland Kitchen... Take 1 tablet by mouth once a day    Lantus Solostar 100 Unit/ml Soln (Insulin glargine) ..... Inject 35 units into the skin of your abdomen once every night    Cozaar 50 Mg Tabs (Losartan potassium) .Marland Kitchen... Take 1 tablet by mouth once a day   Complete Medication List: 1)  Metformin Hcl 1000 Mg Tabs (Metformin hcl) .... Take 1 tablet by mouth two times a day 2)  Glipizide 10 Mg Tabs (Glipizide) .... Take 1 tablet by mouth once a day 3)  Pravachol 40 Mg Tabs (Pravastatin sodium) .... Take 2 tablet by mouth once a day 4)  Toprol Xl 100 Mg Xr24h-tab (Metoprolol succinate) .... Take 1 tablet by mouth once a day 5)  Lantus Solostar 100 Unit/ml Soln (Insulin glargine) .... Inject 35 units into the skin of your abdomen once every night 6)  Bd U/f Short Pen Needle 31g X 8 Mm Misc (Insulin pen needle) .... Use to inject your insulin 7)  Freestyle Test Strp (Glucose blood) .... Use as directed. 8)  Amlodipine Besylate 10 Mg Tabs (Amlodipine besylate) .... Take 1 tablet by mouth once a day 9)  Cozaar 50 Mg Tabs (Losartan potassium) .... Take 1 tablet by mouth once a day  Other Orders: T-Hgb A1C (in-house) ([id number])   Patient Instructions: 1)  Please schedule a follow-up appointment in 3 months.   Prescriptions: COZAAR 50 MG TABS (LOSARTAN POTASSIUM) Take 1 tablet by mouth once a day  #31 x 11   Entered and Authorized by:    [name]   Signed by:   [name] on 08/19/2008   Method used:   Electronically to        [facility]* (retail)       [address]       [place], [place]  [address]       Ph: [phone]       Fax: [phone]   RxID:   [id number]  ] Laboratory Results   Blood Tests  Date/Time Received: August 19, 2008 3:55 PM  Date/Time Reported: Oren Beckmann  August 19, 2008 3:55 PM   HGBA1C: 6.8%   (Normal Range: Non-Diabetic - 3-6%   Control Diabetic - 6-8%)

## 2010-10-25 NOTE — Progress Notes (Signed)
Summary: refill/gg  Phone Note Refill Request  on December 22, 2009 11:10 AM  Refills Requested: Medication #1:  METFORMIN HCL 1000 MG TABS Take 1 tablet by mouth two times a day  Method Requested: Electronic Initial call taken by: Merrie Roof RN,  December 22, 2009 11:10 AM  Follow-up for Phone Call        completed Follow-up by: Mliss Sax MD,  December 22, 2009 12:07 PM    Prescriptions: METFORMIN HCL 1000 MG TABS (METFORMIN HCL) Take 1 tablet by mouth two times a day  #62 x 11   Entered and Authorized by:   Mliss Sax MD   Signed by:   Mliss Sax MD on 12/22/2009   Method used:   Electronically to        [facility] [phone]* (retail)       [address]       [place], [place]  [address]       Ph: [phone]       Fax: [phone]   RxID:   [id number]

## 2010-10-25 NOTE — Assessment & Plan Note (Signed)
Summary: L lower back pain x 2 days, spasms?/pcp-none/hla   Vital Signs:  Patient profile:   50 year old female Height:      62.5 inches (158.75 cm) Weight:      176.9 pounds (79.95 kg) BMI:     31.77 Temp:     97.4 degrees F (36.33 degrees C) oral Pulse rate:   65 / minute BP sitting:   119 / 81  (right arm) Cuff size:   regular  Vitals Entered By: Theotis Barrio NT II (April 01, 2009 10:03 AM) CC: DM  / LEFT SIDE -LOWER BACK PAIN X 3 DAYS Is Patient Diabetic? Yes  Nutritional Status BMI of > 30 = obese  Have you ever been in a relationship where you felt threatened, hurt or afraid?No   Does patient need assistance? Functional Status Self care Ambulation Normal   Prevention & Chronic Care Immunizations   Influenza vaccine: Fluvax Non-MCR  (06/11/2008)   Influenza vaccine deferral: Deferred  (04/01/2009)   Influenza vaccine due: 05/26/2009    Tetanus booster: Not documented   Tetanus booster due: 04/02/2019    Pneumococcal vaccine: Not documented   Pneumococcal vaccine deferral: Deferred  (04/01/2009)  Other Screening   Pap smear: Not documented    Mammogram: No specific mammographic evidence of malignancy.    (07/19/2008)   Mammogram due: 07/2009    Smoking status: quit  (04/01/2009)  Diabetes Mellitus   HgbA1C: 9.2  (04/01/2009)   HgbA1C action/deferral: Ordered  (04/01/2009)   Hemoglobin A1C due: 07/02/2009    Eye exam: Not documented   Diabetic eye exam action/deferral: Deferred  (04/01/2009)    Foot exam: yes  (06/11/2008)   Foot exam action/deferral: Deferred   High risk foot: Not documented   Foot care education: Not documented   Foot exam due: 06/11/2009    Urine microalbumin/creatinine ratio: 158.7  (12/28/2008)    Diabetes flowsheet reviewed?: Yes   Progress toward A1C goal: Deteriorated   Diabetes comments: Pt will schedule an appoinment for her annualeye exam.  Hypertension   Last Blood Pressure: 119 / 81  (04/01/2009)   Serum  creatinine: 0.78  (01/15/2009)   BMP action: Ordered   Serum potassium 4.1  (01/15/2009)    Hypertension flowsheet reviewed?: Yes   Progress toward BP goal: At goal  Lipids   Total Cholesterol: 136  (01/15/2009)   Lipid panel action/deferral: Deferred   LDL: 83  (01/15/2009)   LDL Direct: Not documented   HDL: 41  (01/15/2009)   Triglycerides: 61  (01/15/2009)   Lipid panel due: 07/02/2009    SGOT (AST): 14  (01/15/2009)   SGPT (ALT): 11  (01/15/2009)   Alkaline phosphatase: 142  (01/15/2009)   Total bilirubin: 0.5  (01/15/2009)    Lipid flowsheet reviewed?: Yes   Progress toward LDL goal: Improved  Self-Management Support :   Personal Goals (by the next clinic visit) :     Personal A1C goal: 7  (04/01/2009)     Personal blood pressure goal: 130/80  (04/01/2009)     Personal LDL goal: 70  (04/01/2009)    Patient will work on the following items until the next clinic visit to reach self-care goals:     Medications and monitoring: take my medicines every day, check my blood sugar, bring all of my medications to every visit, examine my feet every day  (04/01/2009)     Eating: drink diet soda or water instead of juice or soda, use fresh or frozen vegetables, eat  foods that are low in salt, eat baked foods instead of fried foods  (04/01/2009)     Activity: take a 30 minute walk every day, take the stairs instead of the elevator  (04/01/2009)     Home glucose monitoring frequency: 2 times a day  (04/01/2009)    Diabetes self-management support: Written self-care plan  (04/01/2009)   Diabetes care plan printed   Last medical nutrition therapy: 12/11/2007    Hypertension self-management support: Written self-care plan, Education handout  (04/01/2009)   Hypertension self-care plan printed.   Hypertension education handout printed    Lipid self-management support: Written self-care plan, Education handout  (04/01/2009)   Lipid self-care plan printed.   Lipid education handout  printed   Nursing Instructions: Give Td booster today HgbA1C today (see order)    Primary Care Provider:  [name]  CC:  DM  / LEFT SIDE -LOWER BACK PAIN X 3 DAYS.  History of Present Illness: 50 years old female with pmh as described on the EMR. Who came to the clinic complaining of back pain for about three days, constant and worsening over time. Pt reports that over the weekend she went to visit her uncle and slept on the floor for 2 days.  She deneis fever, chills, any other trauma or injury, also denies dysuria or numbness on her legs.  She reports been compliant with her medications, but reports having elevated BG reading lately (she endorses be under a lot of stress).  Of note patient's work driving a [employer].  BP during this visit was 119/81 and her A1C 9.2  Preventive Screening-Counseling & Management  Alcohol-Tobacco     Smoking Status: quit     Year Quit: 2006  Caffeine-Diet-Exercise     Does Patient Exercise: no     Times/week: 0  Allergies (verified): 1)  ! Hydrochlorothiazide 2)  ! * Lisnopril  Past History:  Past Medical History: Last updated: 03/07/2007 Diabetes mellitus, type II Hyperlipidemia Hypertension Seasonal allergies  Past Surgical History: Last updated: 01/23/2008 Hysterectomy 2001-? but thinks fibroids.  Social History: Last updated: 01/23/2008 No smoking, no etoh, or illegal drugs.  Married, works  driving a school bus.  Risk Factors: Smoking Status: quit (04/01/2009)  Review of Systems  The patient denies anorexia, fever, chest pain, syncope, dyspnea on exertion, peripheral edema, prolonged cough, abdominal pain, melena, hematochezia, incontinence, muscle weakness, and depression.    Physical Exam  General:  Well-developed,well-nourished,in no acute distress; alert,appropriate and cooperative throughout examination Lungs:  Normal respiratory effort, chest expands symmetrically. Lungs are clear to  auscultation, no crackles or wheezes. Heart:  Normal rate and regular rhythm. S1 and S2 normal without gallop, murmur, click, rub or other extra sounds. Abdomen:  Bowel sounds positive,abdomen soft and non-tender without masses, organomegaly or hernias noted. Msk:  Paraspinal muscles on left side of her back tender to palpation and tense. no joint pain, no crepitation, no joint swelling. Extremities:  No clubbing, cyanosis, edema, or deformity noted with normal full range of motion of all joints.   Neurologic:  alert & oriented X3, cranial nerves II-XII intact, strength normal in all extremities, sensation intact to light touch, and gait normal.     Impression & Recommendations:  Problem # 1:  HYPERTENSION (ICD-401.9) At goal. BP 119/81. Pt will continue same medication regimen and has been advised to follow alow sodium diet and to lose weight for even better control of her BP. Will check renal function an electrolytes status today.  Her updated medication list for this problem includes:    Toprol Xl 100 Mg Xr24h-tab (Metoprolol succinate) .Marland Kitchen... Take 1 tablet by mouth once a day    Amlodipine Besylate 10 Mg Tabs (Amlodipine besylate) .Marland Kitchen... Take 1 tablet by mouth once a day    Cozaar 50 Mg Tabs (Losartan potassium) .Marland Kitchen... Take 1 tablet by mouth once a day  Orders: T-Comprehensive Metabolic Panel ([id number])  Problem # 2:  DIABETES MELLITUS, TYPE II (ICD-250.00) CBG has been running high lately per patient reports. Her A1C today 9.2. Will increased lantus to 42 units daily, will continue metformin and glipizide; pt instructed to follow low carbohydrates diet, to measure her portions, to start checking her BG two times a day and to bring her glucometer with her during next appoinment. Will see her back 2-3 months from now.  Her updated medication list for this problem includes:    Metformin Hcl 1000 Mg Tabs (Metformin hcl) .Marland Kitchen... Take 1 tablet by mouth two times a day    Glipizide 5 Mg Tabs  (Glipizide) .Marland Kitchen... Take 1 tablet by mouth once in the morning    Lantus Solostar 100 Unit/ml Soln (Insulin glargine) ..... Inject 42 units into the skin of your abdomen once every night    Cozaar 50 Mg Tabs (Losartan potassium) .Marland Kitchen... Take 1 tablet by mouth once a day  Orders: T-Hgb A1C (in-house) ([id number])  Problem # 3:  LUMBAGO (ICD-724.2) Pt with acute back pain most likely secondary to muscle strain. Will prescribed a 2 weeks course of NSAID and flexeril. Pt advised to continue been active but to avoid painful movements.   Discussed use of moist heat or ice, modified activities, medications, and stretching/strengthening exercises. Back care instructions given. To be seen in 2 weeks if no improvement; sooner if worsening of symptoms.   Her updated medication list for this problem includes:    Diclofenac Sodium 75 Mg Tbec (Diclofenac sodium) .Marland Kitchen... Take 1 tablet by mouth two times a day    Flexeril 5 Mg Tabs (Cyclobenzaprine hcl) .Marland Kitchen... Take 1 tab by mouth at bedtime  Complete Medication List: 1)  Metformin Hcl 1000 Mg Tabs (Metformin hcl) .... Take 1 tablet by mouth two times a day 2)  Glipizide 5 Mg Tabs (Glipizide) .... Take 1 tablet by mouth once in the morning 3)  Pravachol 40 Mg Tabs (Pravastatin sodium) .... Take 2 tablet by mouth once a day 4)  Toprol Xl 100 Mg Xr24h-tab (Metoprolol succinate) .... Take 1 tablet by mouth once a day 5)  Lantus Solostar 100 Unit/ml Soln (Insulin glargine) .... Inject 42units into the skin of your abdomen once every night 6)  Bd U/f Short Pen Needle 31g X 8 Mm Misc (Insulin pen needle) .... Use to inject your insulin 7)  Freestyle Test Strp (Glucose blood) .... Use as directed. 8)  Amlodipine Besylate 10 Mg Tabs (Amlodipine besylate) .... Take 1 tablet by mouth once a day 9)  Cozaar 50 Mg Tabs (Losartan potassium) .... Take 1 tablet by mouth once a day 10)  Diclofenac Sodium 75 Mg Tbec (Diclofenac sodium) .... Take 1 tablet by mouth two times a day 11)   Flexeril 5 Mg Tabs (Cyclobenzaprine hcl) .... Take 1 tab by mouth at bedtime  Patient Instructions: 1)  Followup in 15 days. If your symptoms get worse or you develop fever, call the office in order to see you sooner. 2)  Take your medications as prescribed. 3)  Most patients (90%) with low back pain will  improve with time (2-6 weeks). Keep active but avoid activities that are painful. Apply moist heat and/or ice to lower back several times a day. 4)  Increased your lantus to 42 units daily. 5)  Check your BG twice a day and follow a low carbohydrates diet. Prescriptions: FLEXERIL 5 MG TABS (CYCLOBENZAPRINE HCL) Take 1 tab by mouth at bedtime  #15 x 0   Entered and Authorized by:   Vassie Loll MD   Signed by:   Vassie Loll MD on 04/01/2009   Method used:   Electronically to        [facility] [phone]* (retail)       [address]       [place], [place]  [address]       Ph: [phone]       Fax: [phone]   RxID:   [id number] DICLOFENAC SODIUM 75 MG TBEC (DICLOFENAC SODIUM) Take 1 tablet by mouth two times a day  #30 x 0   Entered and Authorized by:   Vassie Loll MD   Signed by:   Vassie Loll MD on 04/01/2009   Method used:   Electronically to        [facility] [phone]* (retail)       [address]       [place], [place]  [address]       Ph: [phone]       Fax: [phone]   RxID:   [id number]   Laboratory Results   Blood Tests   Date/Time Received: April 01, 2009 11:47 AM  Date/Time Reported: Alric Quan  April 01, 2009 11:47 AM   HGBA1C: 9.2%   (Normal Range: Non-Diabetic - 3-6%   Control Diabetic - 6-8%)

## 2010-10-25 NOTE — Progress Notes (Signed)
Summary: Refill/gh  Phone Note Refill Request Message from:  Patient on October 28, 2009 11:22 AM  Refills Requested: Medication #1:  AMLODIPINE BESYLATE 10 MG TABS Take 1 tablet by mouth once a day  Medication #2:  TOPROL XL 100 MG XR24H-TAB Take 1 tablet by mouth once a day Last visit was 07/22/2009.  Last labs were 04/01/2009   Method Requested: Electronic Initial call taken by: Angelina Ok RN,  October 28, 2009 11:23 AM  Follow-up for Phone Call        completed Follow-up by: Mliss Sax MD,  October 28, 2009 4:38 PM    Prescriptions: AMLODIPINE BESYLATE 10 MG TABS (AMLODIPINE BESYLATE) Take 1 tablet by mouth once a day  #30 x 3   Entered and Authorized by:   Mliss Sax MD   Signed by:   Mliss Sax MD on 10/28/2009   Method used:   Electronically to        [facility] [phone]* (retail)       [address]       [place], [place]  [address]       Ph: [phone]       Fax: [phone]   RxID:   [id number] TOPROL XL 100 MG XR24H-TAB (METOPROLOL SUCCINATE) Take 1 tablet by mouth once a day  #31 x 11   Entered and Authorized by:   Mliss Sax MD   Signed by:   Mliss Sax MD on 10/28/2009   Method used:   Electronically to        [facility] [phone]* (retail)       [address]       [place], [place]  [address]       Ph: [phone]       Fax: [phone]   RxID:   [id number]

## 2010-10-25 NOTE — Assessment & Plan Note (Signed)
Summary: feet swelling/gg   Vital Signs:  Patient Profile:   50 Years Old Female Height:     62.5 inches (158.75 cm) Weight:      180.3 pounds (81.95 kg) BMI:     32.57 Temp:     97.0 degrees F (36.11 degrees C) oral Pulse rate:   86 / minute BP sitting:   106 / 76  (right arm) Cuff size:   regular  Pt. in pain?   no  Vitals Entered By: Theotis Barrio (November 20, 2007 10:39 AM)              Is Patient Diabetic? Yes  Nutritional Status BMI of > 30 = obese CBG Result 315  Have you ever been in a relationship where you felt threatened, hurt or afraid?No   Does patient need assistance? Functional Status Self care Ambulation Normal     PCP:  [name]  Chief Complaint:  SWELLING IN BOTH FEET / .  History of Present Illness: Teresa Grant is here with complaints of  leg swelling. This has been present on and off  for the last 5-6 years but over the last week end she felt that the swelling on her feet was worse. She denies SOB  or chest pain. She denies recent travel and has  been ambulatory at home.She is a bus Hospital doctor. Over the weekend she has been on her feet for long periods as she has been organising 2 parties.She does not have a history of kidney disease or heart disease     Prior Medication List:  JANUVIA 100 MG TABS (SITAGLIPTIN PHOSPHATE) Take 1 tablet by mouth once a day METFORMIN HCL 1000 MG TABS (METFORMIN HCL) Take 1 tablet by mouth two times a day DIOVAN 160 MG TABS (VALSARTAN) Take 1 tablet by mouth once a day CLARITIN 10 MG TABS (LORATADINE) Take 1 tablet by mouth once a day GLIPIZIDE 10 MG  TABS (GLIPIZIDE) Take 1 tablet by mouth once a day ZOCOR 40 MG  TABS (SIMVASTATIN) Take 1 tablet by mouth once a day NORVASC 10 MG TABS (AMLODIPINE BESYLATE) Take 1 tablet by mouth once a day METOPROLOL TARTRATE 25 MG  TABS (METOPROLOL TARTRATE) Take one tablet by mouth twice a day MECLIZINE HCL 25 MG  CHEW (MECLIZINE HCL) Take 1 tablet by mouth two times a  day   Current Allergies: ! * DOES NOT KNOW  Past Medical History:    Reviewed history from 03/07/2007 and no changes required:       Diabetes mellitus, type II       Hyperlipidemia       Hypertension       Seasonal allergies    Risk Factors: Tobacco use:  quit    Year quit:  2006 Alcohol use:  no Exercise:  no Seatbelt use:  100 %   Review of Systems  The patient denies anorexia, fever, weight loss, weight gain, vision loss, decreased hearing, hoarseness, chest pain, syncope, dyspnea on exhertion, prolonged cough, hemoptysis, abdominal pain, melena, hematochezia, and severe indigestion/heartburn.     Physical Exam  General:     alert.  alert.   Head:     normocephalic.  normocephalic.   Eyes:     vision grossly intact.  vision grossly intact.   Neck:     supple.  supple.   Lungs:     normal respiratory effort, no intercostal retractions, no accessory muscle use, and normal breath sounds.  normal respiratory effort, no  intercostal retractions, no accessory muscle use, and normal breath sounds.   Heart:     normal rate, regular rhythm, no murmur, no gallop, and no rub.  normal rate, regular rhythm, no murmur, no gallop, and no rub.   Abdomen:     soft, non-tender, normal bowel sounds, and no distention.  soft, non-tender, normal bowel sounds, and no distention.   Msk:     normal ROM and no joint tenderness.  normal ROM and no joint tenderness.   Extremities:     2+ left pedal edema and 2+ right pedal edema.  2+ left pedal edema and 2+ right pedal edema.   Neurologic:     alert & oriented X3, cranial nerves II-XII intact, strength normal in all extremities, sensation intact to light touch, sensation intact to pinprick, gait normal, and DTRs symmetrical and normal.  alert & oriented X3, cranial nerves II-XII intact, strength normal in all extremities, sensation intact to light touch, sensation intact to pinprick, gait normal, and DTRs symmetrical and normal.       Impression & Recommendations:  Problem # 1:  EDEMA LEG (ICD-782.3) The leg edema has been a chronic prolem but has been worse over the last week end. She does not have SOB or chest pain or a history of recent travel. She has bilateral lfoot edema but no crackes or murmurs.She has been on her feet for prolonged periods over the weekend and is also a bus driver by occupation. Will check for liver disease, heart failure, kidney disease and thyroid problems. Will hold norvasc.She has been asked to keep her leg elevated when she returns from work and at night and avoid excessive standing. She will follow in 2 weeks or if swelling worse, earlier.Of note, she does not tolerate HCTZ ( abd pain and leevated lipase). Orders: T-Comprehensive Metabolic Panel [id number]) T-TSH [id number]) T-Urinalysis ([id number]) T-BNP  (B Natriuretic Peptide) ([id number])   Complete Medication List: 1)  Januvia 100 Mg Tabs (Sitagliptin phosphate) .... Take 1 tablet by mouth once a day 2)  Metformin Hcl 1000 Mg Tabs (Metformin hcl) .... Take 1 tablet by mouth two times a day 3)  Diovan 160 Mg Tabs (Valsartan) .... Take 1 tablet by mouth once a day 4)  Claritin 10 Mg Tabs (Loratadine) .... Take 1 tablet by mouth once a day 5)  Glipizide 10 Mg Tabs (Glipizide) .... Take 1 tablet by mouth once a day 6)  Zocor 40 Mg Tabs (Simvastatin) .... Take 1 tablet by mouth once a day 7)  Metoprolol Tartrate 25 Mg Tabs (Metoprolol tartrate) .... Take one tablet by mouth twice a day 8)  Meclizine Hcl 25 Mg Chew (Meclizine hcl) .... Take 1 tablet by mouth two times a day  Other Orders: T- Capillary Blood Glucose ([id number]) T-Hgb A1C (in-house) ([id number])   Patient Instructions: 1)  Please schedule a follow-up appointment in 2 weeks.    ] Laboratory Results   Blood Tests   Date/Time Recieved: November 20, 2007 11:46 AM  Date/Time Reported: ..................................................................Marland KitchenOren Beckmann  November 20, 2007 11:46 AM   HGBA1C: 7.6%   (Normal Range: Non-Diabetic - 3-6%   Control Diabetic - 6-8%) CBG Random: 315

## 2010-10-25 NOTE — Progress Notes (Signed)
  Phone Note Call from Patient   Reason for Call: Refill Medication Summary of Call: Patient called wanting a refill for glipizide, however it is not on her medication list. Refill denied. Dr. Randon Goldsmith to address. Initial call taken by: Peggye Pitt MD,  December 14, 2006 5:41 PM

## 2010-10-25 NOTE — Progress Notes (Signed)
Summary: Ultrasound appt//kg  Phone Note Call from Patient Call back at [phone]   Caller: Patient Summary of Call: Received call from pt stating that she came in this morning to have her abd u/s done. Pt states that the radiology tech told her that the u/s being done will only show the top portion of her abdomen and MD would have to order a different test to view the lower portion. Pt did go ahead and have u/s done that was already scheduled, but is requesting that we put in another order to view her lower abd because she's having so much pain.  Will forward message to PCP. Initial call taken by: Krystal Eaton Duncan Dull),  December 11, 2008 1:26 PM  Follow-up for Phone Call        we will order a pelvic u/s Follow-up by: [name],  December 13, 2008 8:16 PM

## 2010-10-25 NOTE — Progress Notes (Signed)
Summary: phone/gg  Phone Note Call from Patient  on January 21, 2010 12:09 PM  Caller: Patient Summary of Call: Pt called requesting refill on pravastatin According to EMR  she should have enough refills to last until 10/11 Pharmacy called and verified she has refills.  Initial call taken by: Merrie Roof RN,  January 21, 2010 12:11 PM

## 2010-10-25 NOTE — Letter (Signed)
Summary: Meter down load  Meter down load   Imported By: Florinda Marker 03/26/2007 10:52:56  _____________________________________________________________________  External Attachment:    Type:   Image     Comment:   External Document

## 2010-10-25 NOTE — Progress Notes (Signed)
Summary: med refill/gp  Phone Note Refill Request Message from:  Fax from Pharmacy on February 28, 2010 1:57 PM  Refills Requested: Medication #1:  AMLODIPINE BESYLATE 10 MG TABS Take 1 tablet by mouth once a day   Last Refilled: 01/21/2010  Method Requested: Electronic Initial call taken by: Chinita Pester RN,  February 28, 2010 1:57 PM  Follow-up for Phone Call       Follow-up by: Mliss Sax MD,  February 28, 2010 10:58 PM    Prescriptions: AMLODIPINE BESYLATE 10 MG TABS (AMLODIPINE BESYLATE) Take 1 tablet by mouth once a day  #30 x 11   Entered and Authorized by:   Mliss Sax MD   Signed by:   Mliss Sax MD on 02/28/2010   Method used:   Electronically to        [facility] [phone]* (retail)       [address]       [place], [place]  [address]       Ph: [phone]       Fax: [phone]   RxID:   [id number]

## 2010-10-25 NOTE — Assessment & Plan Note (Signed)
Summary: allergic cough, possible uti/pcp-ortiz/hla   Vital Signs:  Patient Profile:   50 Years Old Female Height:     62.5 inches (158.75 cm) Weight:      178.5 pounds (81.14 kg) BMI:     32.24 Temp:     97.5 degrees F (36.39 degrees C) oral Pulse rate:   81 / minute BP sitting:   118 / 81  (right arm)  Pt. in pain?   no  Vitals Entered By: Stanton Kidney Ditzler RN (July 07, 2008 11:29 AM)              Is Patient Diabetic? Yes Nutritional Status BMI of > 30 = obese Nutritional Status Detail appetite good CBG Result 137  Have you ever been in a relationship where you felt threatened, hurt or afraid?denies   Does patient need assistance? Functional Status Self care Ambulation Normal     PCP:  [name]  Chief Complaint:  Clear productive coug, throat tickles, and chest feels heavy and nasal drainage with coug. Freq urination with pressure. Leaking urine.Marland Kitchen  History of Present Illness: Teresa Grant comes in today with 2 complaints: Cough and dysuria. The cough started several weeks ago and it has been persitent and chronic. She gets a tickling sensation in the back of her throat then goes into a coughing fit and cannot catch her breath. She does say she has wheezing during these episodes. Nothing has helped the cough and it resolves acutely on it own but returns-worse at night. She has only been treated for allergic rhinitis. She has been unable to identify a trigger. No fevers, chills, NVD. No adenopathy or recent weight loss. She has never smoked. No lung disease in her family. As far as  the dysuria, it started a couple of days ago. At baseline she says that she urinates frequently. 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 No high risk sexual practice or vaginal symptoms.     Prior Medication List:  METFORMIN HCL 1000 MG TABS  (METFORMIN HCL) Take 1 tablet by mouth two times a day GLIPIZIDE 10 MG  TABS (GLIPIZIDE) Take 1 tablet by mouth once a day PRAVACHOL 40 MG TABS (PRAVASTATIN SODIUM) Take 2 tablet by mouth once a day TOPROL XL 100 MG XR24H-TAB (METOPROLOL SUCCINATE) Take 1 tablet by mouth once a day LISINOPRIL 20 MG  TABS (LISINOPRIL) Take 1 tablet by mouth once a day LANTUS SOLOSTAR 100 UNIT/ML  SOLN (INSULIN GLARGINE) Inject 30 units into the skin of your abdomen once every night BD U/F SHORT PEN NEEDLE 31G X 8 MM  MISC (INSULIN PEN NEEDLE) Use to inject your insulin FREESTYLE TEST   STRP (GLUCOSE BLOOD) Use as directed. AMLODIPINE BESYLATE 10 MG TABS (AMLODIPINE BESYLATE) Take 1 tablet by mouth once a day FEXOFENADINE HCL 180 MG TABS (FEXOFENADINE HCL) Take 1 tablet by mouth once a day for allergies   Current Allergies (reviewed today): ! HYDROCHLOROTHIAZIDE  Past Medical History:    Reviewed history from 03/07/2007 and no changes required:       Diabetes mellitus, type II       Hyperlipidemia       Hypertension       Seasonal allergies    Risk Factors: Tobacco use:  quit    Year quit:  2006 Alcohol use:  no Exercise:  no Seatbelt use:  100 %  Family History Risk Factors:    Family History of MI in females < 1 years old:  no    Family History of MI in males < 75 years old:  no   Review of Systems      See HPI   Physical Exam  General:     Well-developed,well-nourished,in no acute distress; alert,appropriate and cooperative throughout examination Nose:     mucosa appears normal, no facial tenderness Mouth:     slight pharyngeal erythema, no exudates Neck:     No LAD. Lungs:     Normal respiratory effort, chest expands symmetrically. Lungs are clear to auscultation, no crackles or wheezes. Heart:     Normal rate and regular rhythm. S1 and S2 normal without gallop, murmur, click, rub or other extra sounds.    Impression & Recommendations:  Problem # 1:  COUGH  (ICD-786.2) Will treat her cough as variant Asthma and start her on albuterol inhaler to see if helps when she has one of her "coughing spells". I suspect there is either a reactive component or post nasal gtt trigger. She did not get relief with antihistamines. She has tried nasal steroids in the past but they made her nose bleed. She will need PFT's to determine if she has asthma. If this w/u fails or is unsuccessful then we can send her for formal allergy testing and eval. I also think GERD/occult reflux may be an issue so I am going to put her an H2 Blocker at night and see if this helps. Of note she has a hx of pneumonia back in april of 09- but I think this far out a residual cough would be unlikely.  F/U in 1 month or if symptoms do not improve.  Problem # 2:  DIABETES MELLITUS, TYPE II (ICD-250.00) A1C with fair control. Will need to see PCP for A1C due in Dec./Nov. Otherwise she is up to date on screenings. She is comfortable with her current DM regimen.  Her updated medication list for this problem includes:    Metformin Hcl 1000 Mg Tabs (Metformin hcl) .Marland Kitchen... Take 1 tablet by mouth two times a day    Glipizide 10 Mg Tabs (Glipizide) .Marland Kitchen... Take 1 tablet by mouth once a day    Lisinopril 20 Mg Tabs (Lisinopril) .Marland Kitchen... Take 1 tablet by mouth once a day    Lantus Solostar 100 Unit/ml Soln (Insulin glargine) ..... Inject 30 units into the skin of your abdomen once every night  Orders: Capillary Blood Glucose (04540) Fingerstick (98119)  Labs Reviewed: HgBA1c: 7.3 (05/28/2008)   Creat: 0.79 (12/11/2007)   Microalbumin: 23.80 (12/27/2007)   Problem # 3:  DYSURIA (ICD-788.1) Will start with a UA. If the UA is negative and symptoms persist she will need to have a full gyn/urological eval.   Check UA-follow symptoms     Complete Medication List: 1)  Metformin Hcl 1000 Mg Tabs (Metformin hcl) .... Take 1 tablet by mouth two times a day 2)  Glipizide 10 Mg Tabs (Glipizide) .... Take 1  tablet by mouth once a day 3)  Pravachol 40 Mg Tabs (Pravastatin sodium) .... Take 2 tablet by mouth once a day 4)  Toprol Xl 100 Mg Xr24h-tab (Metoprolol succinate) .... Take 1 tablet by mouth once a day 5)  Lisinopril 20 Mg Tabs (Lisinopril) .... Take 1 tablet by mouth once a day 6)  Lantus Solostar 100 Unit/ml Soln (Insulin glargine) .... Inject 30 units into the skin of your abdomen  once every night 7)  Bd U/f Short Pen Needle 31g X 8 Mm Misc (Insulin pen needle) .... Use to inject your insulin 8)  Freestyle Test Strp (Glucose blood) .... Use as directed. 9)  Amlodipine Besylate 10 Mg Tabs (Amlodipine besylate) .... Take 1 tablet by mouth once a day 10)  Fexofenadine Hcl 180 Mg Tabs (Fexofenadine hcl) .... Take 1 tablet by mouth once a day for allergies 11)  Proventil Hfa 108 (90 Base) Mcg/act Aers (Albuterol sulfate) .... Take 2 puffs every 4 hours as needed for cough or wheezing 12)  Flunisolide 29 Mcg/act Soln (Flunisolide) .... Use 2 sprays in each nostril twice daily 13)  Simply Saline 0.9 % Aers (Saline) .... Use as directed two times a day 14)  Famotidine 20 Mg Tabs (Famotidine) .... Take 1 tablet by mouth once a day at bedtime  Other Orders: T-Urinalysis (04540-98119) PFT Baseline-Pre/Post Bronchodiolator (PFT Baseline-Pre/Pos)   Patient Instructions: 1)  F/U 2 weeks possible asthma w/u 2)  Start on nose-spray 3)  Start you on an inhaler for cough episodes called Proventil. 4)  We will set you up for asthma study PFT's 5)  Will also start you on medication for reflux called Famotidine.   Prescriptions: FAMOTIDINE 20 MG TABS (FAMOTIDINE) Take 1 tablet by mouth once a day at bedtime  #30 x 6   Entered and Authorized by:   Teresa Fusi  DO   Signed by:   Teresa Fusi  DO on 07/07/2008   Method used:   Electronically to        [facility]* (retail)       [address]       [place], [place]  [address]       Ph: [phone]       Fax: [phone]   RxID:    [id number] SIMPLY SALINE 0.9 % AERS (SALINE) Use as directed two times a day  #1 x 11   Entered and Authorized by:   Teresa Fusi  DO   Signed by:   Teresa Fusi  DO on 07/07/2008   Method used:   Electronically to        [facility]* (retail)       [address]       [place], [place]  [address]       Ph: [phone]       Fax: [phone]   RxID:   [id number] FLUNISOLIDE 29 MCG/ACT SOLN (FLUNISOLIDE) Use 2 sprays in each nostril twice daily  #1 x 3   Entered and Authorized by:   Teresa Fusi  DO   Signed by:   Teresa Fusi  DO on 07/07/2008   Method used:   Electronically to        [facility]* (retail)       [address]       [place], [place]  [address]       Ph: [phone]       Fax: [phone]   RxID:   [id number] PROVENTIL HFA 108 (90 BASE) MCG/ACT AERS (ALBUTEROL SULFATE) Take 2 puffs every 4 hours as needed for cough or wheezing  #1 x 3   Entered and Authorized by:   Teresa Fusi  DO   Signed by:   Teresa Fusi  DO on 07/07/2008   Method used:   Electronically to        [facility]* (retail)       [address]  [place], [place]  [address]       Ph: [phone]       Fax: [phone]   RxID:   [id number]  ]

## 2010-10-25 NOTE — Progress Notes (Signed)
Summary: refill/ hla  Phone Note Refill Request Message from:  Patient on March 17, 2009 9:42 AM  Refills Requested: Medication #1:  GLIPIZIDE 5 MG TABS Take 1 tablet by mouth once in the morning  Medication #2:  BD U/F SHORT PEN NEEDLE 31G X 8 MM  MISC Use to inject your insulin Initial call taken by: Marin Roberts RN,  March 17, 2009 9:42 AM  Follow-up for Phone Call        I will refill. However, I am not sure if the patient needs to continue on glipizide in addition to insulin and metformin. Glipizide recently decreased from 10 to 5mg  daily due to hypoglycemia. Please call pt. Ask her about hypoglycemic symptoms and make her an appt to be seen to discuss meds.  Thanks. Follow-up by: Ned Grace MD,  March 17, 2009 12:20 PM      Prescriptions: BD U/F SHORT PEN NEEDLE 31G X 8 MM  MISC (INSULIN PEN NEEDLE) Use to inject your insulin  #1 box x 11   Entered and Authorized by:   Ned Grace MD   Signed by:   Ned Grace MD on 03/17/2009   Method used:   Electronically to        [facility] [phone]* (retail)       [address]       [place], [place]  [address]       Ph: [phone]       Fax: [phone]   RxID:   [id number] GLIPIZIDE 5 MG TABS (GLIPIZIDE) Take 1 tablet by mouth once in the morning  #30 x 0   Entered and Authorized by:   Harriett Sine Phifer MD   Signed by:   Ned Grace MD on 03/17/2009   Method used:   Electronically to        [facility] [phone]* (retail)       [address]       [place], [place]  [address]       Ph: [phone]       Fax: [phone]   RxID:   [id number]

## 2010-10-25 NOTE — Progress Notes (Signed)
Summary: phone/gg  Phone Note Call from Patient   Caller: Patient Summary of Call: Pt call with c/o swelling to bil leg and feet.  she feels " a little" SOB.  Onset  5 days ago. Pt advised to be seen today but she can't come in.  Appointment given for tomorrow but she will go to [facility] for problems  Initial call taken by: Merrie Roof RN,  July 21, 2009 12:23 PM

## 2010-10-25 NOTE — Progress Notes (Signed)
Summary: refill/gh  Phone Note Refill Request Message from:  Fax from Pharmacy on November 26, 2006 11:08 AM  Refills Requested: Medication #1:  Not Not preloaded. Pt needs Januvia 100 mg.  Last labs 06/29/06  Initial call taken by: Angelina Ok RN,  November 26, 2006 11:18 AM  Follow-up for Phone Call        Refill approved-nurse to complete. Please schedule appt with Dr. Randon Goldsmith next available since he is the patient's PCP. Follow-up by: Ellie Lunch MD,  November 26, 2006 12:12 PM  Additional Follow-up for Phone Call Additional follow up Details #1::        Rx faxed to pharmacy Additional Follow-up by: Angelina Ok RN,  November 26, 2006 2:33 PM  New/Updated Medications: JANUVIA 100 MG TABS (SITAGLIPTIN PHOSPHATE) Take 1 tablet by mouth once a day  New/Updated Medications: JANUVIA 100 MG TABS (SITAGLIPTIN PHOSPHATE) Take 1 tablet by mouth once a day  Prescriptions: JANUVIA 100 MG TABS (SITAGLIPTIN PHOSPHATE) Take 1 tablet by mouth once a day  #31 x 0   Entered and Authorized by:   Ellie Lunch MD   Signed by:   Ellie Lunch MD on 11/26/2006   Method used:   Telephoned to ...         RxID:   [id number]

## 2010-10-25 NOTE — Progress Notes (Signed)
Summary: Refill/gh  Phone Note Refill Request Message from:  Pharmacy on July 20, 2008 11:19 AM  Refills Requested: Medication #1:  TOPROL XL 100 MG XR24H-TAB Take 1 tablet by mouth once a day Would like a 2 month supply.   Method Requested: Electronic Initial call taken by: Angelina Ok RN,  July 20, 2008 11:19 AM      Prescriptions: TOPROL XL 100 MG XR24H-TAB (METOPROLOL SUCCINATE) Take 1 tablet by mouth once a day  #31 x 11   Entered and Authorized by:   [name]   Signed by:   [name] on 07/20/2008   Method used:   Electronically to        [facility]* (retail)       [address]       [place], [place]  [address]       Ph: [phone]       Fax: [phone]   RxID:   [id number]

## 2010-10-25 NOTE — Progress Notes (Signed)
  Phone Note Call from Patient   Summary of Call: Pt called  stating cgb's have been running 390 or over for last few weeks.  today only reads high. voiding every 20 minutes and tired. will see today at 1500 Initial call taken by: Merrie Roof RN,  February 26, 2007 11:17 AM

## 2010-10-25 NOTE — Miscellaneous (Signed)
Summary: Audio-Visual Observation & Taping  Audio-Visual Observation & Taping   Imported By: Florinda Marker 07/08/2008 14:57:39  _____________________________________________________________________  External Attachment:    Type:   Image     Comment:   External Document

## 2010-10-25 NOTE — Progress Notes (Signed)
Summary: B/P elevation  Phone Note Call from Patient   Caller: Patient Call For: [name] Complaint: Breathing Problems Summary of Call: Ca;ll from pt stating that she has had a cough for 2 weeks that makes her chest hurt and that her B/P have been rising since starting on Lisinopril.  Pt says that her B/P is around 190/123.  She says that she cannot come in today until after work at 5 pm.  Pt was advised that she might perhaps consider going to Urgent Care for B/P assessment and to call us for a follow up appointment since she does not want to go to the ED.   Initial call taken by: Angelina Ok RN,  May 25, 2008 2:34 PM  Follow-up for Phone Call        stop lisinopril. and come to the [facility] ASAP Follow-up by: [name],  May 25, 2008 3:15 PM  Additional Follow-up for Phone Call Additional follow up Details #1::        Pt has an appointment on 05/28/08 Additional Follow-up by: Angelina Ok RN,  May 28, 2008 10:00 AM

## 2010-10-25 NOTE — Assessment & Plan Note (Signed)
Summary: 2WK FU/EST/VS   Vital Signs:  Patient Profile:   50 Years Old Female Height:     62.5 inches (158.75 cm) Weight:      178.5 pounds (81.14 kg) BMI:     32.24 Temp:     97.7 degrees F (36.50 degrees C) oral Pulse rate:   91 / minute BP sitting:   115 / 79  (right arm)  Pt. in pain?   no  Vitals Entered By: Chinita Pester RN (June 11, 2008 9:32 AM)              Is Patient Diabetic? Yes  Nutritional Status BMI of > 30 = obese CBG Result 189  Have you ever been in a relationship where you felt threatened, hurt or afraid?No   Does patient need assistance? Functional Status Self care Ambulation Normal Comments Pt. kept an record (with her)     PCP:  [name]  Chief Complaint:  F/U.  History of Present Illness: 50 y/o with PMH DM II, HTN and HLP, comes in for a regular check up, She is not sure which medication she has been taking, she is a little confuse as thay have changed them twice. Her BP is under great control. She has no side effects from her medication. No episodesof hypoglycemia.    Prior Medication List:  METFORMIN HCL 1000 MG TABS (METFORMIN HCL) Take 1 tablet by mouth two times a day GLIPIZIDE 10 MG  TABS (GLIPIZIDE) Take 1 tablet by mouth once a day LIPITOR 80 MG TABS (ATORVASTATIN CALCIUM) Take 1 tablet by mouth once a day for your cholesterol TOPROL XL 100 MG XR24H-TAB (METOPROLOL SUCCINATE) Take 1 tablet by mouth once a day LISINOPRIL 20 MG  TABS (LISINOPRIL) Take 1 tablet by mouth once a day LANTUS SOLOSTAR 100 UNIT/ML  SOLN (INSULIN GLARGINE) Inject 24 units into the skin of your abdomen once every night BD U/F SHORT PEN NEEDLE 31G X 8 MM  MISC (INSULIN PEN NEEDLE) Use to inject your insulin FREESTYLE TEST   STRP (GLUCOSE BLOOD) Use as directed. AMLODIPINE BESYLATE 10 MG TABS (AMLODIPINE BESYLATE) Take 1 tablet by mouth once a day FEXOFENADINE HCL 180 MG TABS (FEXOFENADINE HCL) Take 1 tablet by mouth once a day for  allergies   Current Allergies: ! HYDROCHLOROTHIAZIDE    Risk Factors: Tobacco use:  quit    Year quit:  2006 Alcohol use:  no Exercise:  no Seatbelt use:  100 %  Family History Risk Factors:    Family History of MI in females < 52 years old:  no    Family History of MI in males < 32 years old:  no   Review of Systems  The patient denies anorexia, fever, weight loss, weight gain, vision loss, decreased hearing, hoarseness, chest pain, syncope, dyspnea on exertion, peripheral edema, prolonged cough, headaches, hemoptysis, abdominal pain, melena, severe indigestion/heartburn, hematuria, incontinence, genital sores, muscle weakness, suspicious skin lesions, difficulty walking, depression, unusual weight change, enlarged lymph nodes, angioedema, and breast masses.     Physical Exam  General:     Well-developed,well-nourished,in no acute distress; alert,appropriate and cooperative throughout examination Lungs:     Normal respiratory effort, chest expands symmetrically. Lungs are clear to auscultation, no crackles or wheezes. Heart:     Normal rate and regular rhythm. S1 and S2 normal without gallop, murmur, click, rub or other extra sounds.  Diabetes Management Exam:    Foot Exam (with socks and/or shoes not present):  Sensory-Monofilament:          Left foot: normal          Right foot: normal    Impression & Recommendations:  Problem # 1:  HYPERTENSION (ICD-401.9) BP well control she is to bring her medication next time she comes in. I will not make any changes. Her updated medication list for this problem includes:    Toprol Xl 100 Mg Xr24h-tab (Metoprolol succinate) .Marland Kitchen... Take 1 tablet by mouth once a day    Lisinopril 20 Mg Tabs (Lisinopril) .Marland Kitchen... Take 1 tablet by mouth once a day    Amlodipine Besylate 10 Mg Tabs (Amlodipine besylate) .Marland Kitchen... Take 1 tablet by mouth once a day  BP today: 115/79 Prior BP: 156/100 (05/28/2008)  Labs Reviewed: Creat: 0.79  (12/11/2007) Chol: 151 (05/28/2008)   HDL: 43 (05/28/2008)   LDL: 87 (05/28/2008)   TG: 105 (05/28/2008)   Problem # 2:  DIABETES MELLITUS, TYPE II (ICD-250.00) HbgA1c almost at goal.  I will go ahead and increase her lantus. Her BG at home has been running from 120-255 in the am. Her updated medication list for this problem includes:    Metformin Hcl 1000 Mg Tabs (Metformin hcl) .Marland Kitchen... Take 1 tablet by mouth two times a day    Glipizide 10 Mg Tabs (Glipizide) .Marland Kitchen... Take 1 tablet by mouth once a day    Lisinopril 20 Mg Tabs (Lisinopril) .Marland Kitchen... Take 1 tablet by mouth once a day    Lantus Solostar 100 Unit/ml Soln (Insulin glargine) ..... Inject 30 units into the skin of your abdomen once every night  Orders: Capillary Blood Glucose (04540) Fingerstick (98119)  Future Orders: T-Lipid Profile ([id number]) ... 08/25/2008   Problem # 3:  HYPERLIPIDEMIA (ICD-272.4) Her Framingham risk score for 10% risk for CV event is <1%, Her goal even being a DM is < 100mg /dl. I will go ahead and change it back to pravchol 40. Her updated medication list for this problem includes:    Pravachol 40 Mg Tabs (Pravastatin sodium) .Marland Kitchen... Take 2 tablet by mouth once a day   Problem # 4:  PREVENTIVE HEALTH CARE (ICD-V70.0) Pap smear on next visit. Mamogram schedule. Orders: Mammogram (Screening) (Mammo)   Complete Medication List: 1)  Metformin Hcl 1000 Mg Tabs (Metformin hcl) .... Take 1 tablet by mouth two times a day 2)  Glipizide 10 Mg Tabs (Glipizide) .... Take 1 tablet by mouth once a day 3)  Pravachol 40 Mg Tabs (Pravastatin sodium) .... Take 2 tablet by mouth once a day 4)  Toprol Xl 100 Mg Xr24h-tab (Metoprolol succinate) .... Take 1 tablet by mouth once a day 5)  Lisinopril 20 Mg Tabs (Lisinopril) .... Take 1 tablet by mouth once a day 6)  Lantus Solostar 100 Unit/ml Soln (Insulin glargine) .... Inject 30 units into the skin of your abdomen once every night 7)  Bd U/f Short Pen Needle 31g X 8 Mm  Misc (Insulin pen needle) .... Use to inject your insulin 8)  Freestyle Test Strp (Glucose blood) .... Use as directed. 9)  Amlodipine Besylate 10 Mg Tabs (Amlodipine besylate) .... Take 1 tablet by mouth once a day 10)  Fexofenadine Hcl 180 Mg Tabs (Fexofenadine hcl) .... Take 1 tablet by mouth once a day for allergies  Other Orders: Influenza Vaccine NON MCR (30865)  Future Orders: T-Comprehensive Metabolic Panel ([id number]) ... 08/25/2008   Patient Instructions: 1)  Please schedule a follow-up appointment in 3 months, Pap smear. 2)  Please return for  lab work one (1) week before your next appointment.  3)  Check your blood sugars regularly. If your readings are usually above : 350 or below 70 you should contact our office. 4)  It is important that your Diabetic A1c level is checked every 3 months.   Prescriptions: LANTUS SOLOSTAR 100 UNIT/ML  SOLN (INSULIN GLARGINE) Inject 30 units into the skin of your abdomen once every night  #1 x 1   Entered and Authorized by:   [name]   Signed by:   [name] on 06/11/2008   Method used:   Electronically to        [facility]* (retail)       [address]       [place], [place]  [address]       Ph: [phone]       Fax: [phone]   RxID:   [id number] PRAVACHOL 40 MG TABS (PRAVASTATIN SODIUM) Take 2 tablet by mouth once a day  #62 x 11   Entered and Authorized by:   [name]   Signed by:   [name] on 06/11/2008   Method used:   Electronically to        [facility]* (retail)       [address]       [place], [place]  [address]       Ph: [phone]       Fax: [phone]   RxID:   [id number]  ]  Influenza Vaccine    Vaccine Type: Fluvax Non-MCR    Site: left deltoid    Mfr: GlaxoSmithKline    Dose: 0.5 ml    Route: IM    Given by: Chinita Pester RN    Exp. Date: 03/24/2009    Lot #: [id number]    VIS given: 04/18/07 version given June 11, 2008.  Flu Vaccine Consent Questions    Do you have a history of severe allergic reactions to this vaccine? no    Any prior history of allergic reactions to egg and/or gelatin? no    Do you have a sensitivity to the preservative Thimersol? no    Do you have a past history of Guillan-Barre Syndrome? no    Do you currently have an acute febrile illness? no    Have you ever had a severe reaction to latex? no    Vaccine information given and explained to patient? yes    Are you currently pregnant? no   Last LDL:                                                 87 (05/28/2008 8:41:00 PM)        Diabetic Foot Exam Last Podiatry Exam Date: 06/11/2008  Foot Inspection Is there a history of a foot ulcer?              No Is there a foot ulcer now?              No Can the patient see the bottom of their feet?          Yes Are the shoes appropriate in style and fit?          Yes Is there swelling or an abnormal foot shape?  No Are the toenails long?                No Are the toenails thick?                No Are the toenails ingrown?              No Is there heavy callous build-up?              Yes Is there a claw toe deformity?                          No      Comments: Heavy callous build-up on heels bilaterally; decreased sensation on filament test.   10-g (5.07) Semmes-Weinstein Monofilament Test           Right Foot          Left Foot Visual Inspection     normal         normal Test Control      normal         normal Site 1         normal         normal Site 2         normal         normal Site 3         normal         normal Site 4         normal         normal Site 5         normal         normal Site 6         normal         normal Site 7         normal         normal Site 8         normal         normal Site 9         normal         normal Site 10         normal         normal  Impression      normal         normal

## 2010-11-04 ENCOUNTER — Ambulatory Visit: Payer: Self-pay | Admitting: Internal Medicine

## 2010-11-07 ENCOUNTER — Encounter: Payer: Self-pay | Admitting: Internal Medicine

## 2010-11-07 LAB — HM PAP SMEAR

## 2010-12-16 ENCOUNTER — Other Ambulatory Visit: Payer: Self-pay | Admitting: *Deleted

## 2010-12-16 LAB — COMPREHENSIVE METABOLIC PANEL
ALT: 18 U/L (ref 0–35)
AST: 25 U/L (ref 0–37)
Albumin: 3.8 g/dL (ref 3.5–5.2)
Alkaline Phosphatase: 136 U/L — ABNORMAL HIGH (ref 39–117)
BUN: 9 mg/dL (ref 6–23)
CO2: 25 mEq/L (ref 19–32)
Calcium: 9.5 mg/dL (ref 8.4–10.5)
Chloride: 106 mEq/L (ref 96–112)
Creatinine, Ser: 0.8 mg/dL (ref 0.4–1.2)
GFR calc Af Amer: >60 mL/min (ref >60)
GFR calc non Af Amer: >60 mL/min (ref >60)
Glucose, Bld: 82 mg/dL (ref 70–99)
Potassium: 3.7 mEq/L (ref 3.5–5.1)
Sodium: 141 mEq/L (ref 135–145)
Total Bilirubin: 0.3 mg/dL (ref 0.3–1.2)
Total Protein: 7.9 g/dL (ref 6.0–8.3)

## 2010-12-16 LAB — DIFFERENTIAL
Basophils Absolute: 0.1 10*3/uL (ref 0.0–0.1)
Basophils Relative: 1 % (ref 0–1)
Eosinophils Absolute: 0.1 10*3/uL (ref 0.0–0.7)
Eosinophils Relative: 2 % (ref 0–5)
Lymphocytes Relative: 35 % (ref 12–46)
Lymphs Abs: 2 10*3/uL (ref 0.7–4.0)
Monocytes Absolute: 0.4 10*3/uL (ref 0.1–1.0)
Monocytes Relative: 7 % (ref 3–12)
Neutro Abs: 3.2 10*3/uL (ref 1.7–7.7)
Neutrophils Relative %: 55 % (ref 43–77)

## 2010-12-16 LAB — URINALYSIS, ROUTINE W REFLEX MICROSCOPIC
Bilirubin Urine: NEGATIVE
Glucose, UA: NEGATIVE mg/dL
Ketones, ur: NEGATIVE mg/dL
Leukocytes, UA: NEGATIVE
Nitrite: NEGATIVE
Protein, ur: NEGATIVE mg/dL
Specific Gravity, Urine: 1.014 (ref 1.005–1.030)
Urobilinogen, UA: 1 mg/dL (ref 0.0–1.0)
pH: 7 (ref 5.0–8.0)

## 2010-12-16 LAB — CBC
HCT: 36.3 % (ref 36.0–46.0)
Hemoglobin: 12.1 g/dL (ref 12.0–15.0)
MCHC: 33.3 g/dL (ref 30.0–36.0)
MCV: 83.1 fL (ref 78.0–100.0)
Platelets: 255 10*3/uL (ref 150–400)
RBC: 4.37 MIL/uL (ref 3.87–5.11)
RDW: 14.4 % (ref 11.5–15.5)
WBC: 5.8 10*3/uL (ref 4.0–10.5)

## 2010-12-16 LAB — URINE MICROSCOPIC-ADD ON

## 2010-12-16 LAB — LIPASE, BLOOD: Lipase: 53 U/L (ref 11–59)

## 2010-12-16 MED ORDER — METOPROLOL SUCCINATE ER 100 MG PO TB24: 100.0000 mg | ORAL_TABLET | Freq: Every day | ORAL | Status: DC

## 2010-12-29 LAB — GLUCOSE, CAPILLARY: Glucose-Capillary: 136 mg/dL — ABNORMAL HIGH (ref 70–99)

## 2011-01-03 ENCOUNTER — Other Ambulatory Visit: Payer: Self-pay | Admitting: *Deleted

## 2011-01-03 NOTE — Telephone Encounter (Signed)
Last visit 4/11

## 2011-01-05 LAB — GLUCOSE, CAPILLARY: Glucose-Capillary: 103 mg/dL — ABNORMAL HIGH (ref 70–99)

## 2011-01-07 MED ORDER — METFORMIN HCL 1000 MG PO TABS: 1000.0000 mg | ORAL_TABLET | Freq: Two times a day (BID) | ORAL | Status: DC

## 2011-01-10 ENCOUNTER — Other Ambulatory Visit: Payer: Self-pay | Admitting: Internal Medicine

## 2011-02-10 NOTE — Assessment & Plan Note (Signed)
[facility]                           GASTROENTEROLOGY OFFICE NOTE   Teresa Grant, Teresa Grant                   MRN:          [id number]  DATE:04/18/2006                            DOB:          1961/08/03    REASON FOR CONSULTATION:  Abdominal pain.   Teresa Grant is a pleasant 50 year old African-American female referred  through the courtesy of Dr. Orma Flaming for evaluation. For several months she  has been complaining of abdominal pain consisting of pain starting in the  left upper quadrant. Pain radiated up to the right upper quadrant and mid  epigastrium and to the chest. It is not affected by eating. It may be  slightly worsened with a bowel movement. She denies nausea or vomiting. She  does not have pyrosis per se. There is no history of dysphagia. 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 She had a history of  elevated lipase to 200. Ultrasound CT was negative for gallbladder disease  or pancreatitis. Elevated lipase was attributed to her HCTZ. A gastric  emptying scan and HIDA scan were also unremarkable. She suffers from chronic  constipation and may have a bowel movement with the help of laxative perhaps  once a week. On one occasion she saw blood mixed with her stools.   PAST MEDICAL HISTORY:  Pertinent for new onset diabetes. She has  hypertension. She is status post tubal ligation.   FAMILY HISTORY:  Noncontributory.   MEDICATIONS:  Lipitor, Diovan, metformin, glipizide, metoprolol, aspirin,  amlodipine, furosemide, and Actos.   ALLERGIES:  She has no allergies.   HABITS:  She neither smokes nor drinks.   SOCIAL HISTORY:  She is married and works as a Midwife.   REVIEW OF SYSTEMS:  Positive for feet swelling, joint pains, back pains,  night sweats, muscle cramps, excessive urination.   PHYSICAL EXAMINATION:  VITAL SIGNS:  Pulses 70, blood pressure 134/74,  weight 176.  HEENT: EOMI. PERRLA. Sclerae  are anicteric. Conjunctivae are pink.  NECK:  Supple without thyromegaly, adenopathy or carotid bruits.  CHEST:  Clear to auscultation and percussion without adventitious sounds.  CARDIAC:  Regular rhythm; normal S1 S2. There are no murmurs, gallops or  rubs.  ABDOMEN:  Bowel sounds are normoactive. Abdomen is soft, non-tender and  nondistended. There are no abdominal masses, tenderness, splenic enlargement  or hepatomegaly.  EXTREMITIES:  Full range of motion. No cyanosis, clubbing or edema.  RECTAL:  Deferred.   IMPRESSION:  Persistent abdominal pain. Pain has remnants of upper GI pain  characterized by pain with radiation to her chest raising the question of  gastroesophageal reflux. At the same time she suffers from chronic  constipation which could be a causative factor. There is no evidence for  biliary tract disease. I do not think the pain is from pancreatitis.   RECOMMENDATION:  1.  Empiric trial of Protonix 40 mg a day.  2.  Upper endoscopy.  3.  Begin fiber supplementation.  4.  Colonoscopy.  Barbette Hair. Arlyce Dice, MD, [facility]   RDK/MedQ  DD:  04/18/2006  DT:  04/18/2006  Job #:  [id number]   cc:   Inis Sizer, MD

## 2011-02-10 NOTE — Consult Note (Signed)
NAME:  UNNAMED, HINO                      ACCOUNT NO.:  [id number]   MEDICAL RECORD NO.:  [id number]                   PATIENT TYPE:  EMS   LOCATION:  ED                                   FACILITY:  [facility]   PHYSICIAN:  Currie Paris, M.D.           DATE OF BIRTH:  July 29, 1961   DATE OF CONSULTATION:  10/22/2002  DATE OF DISCHARGE:                                   CONSULTATION   CHIEF COMPLAINT:  Abdominal pain.   REASON FOR CONSULTATION:  I was asked by Dr. Cliffton Asters to see this patient for  lower abdominal pain and possible appendicitis.   HISTORY OF PRESENT ILLNESS:  The patient has had for the last two or three  weeks some intermittent abdominal pain.  Seems to start in the low abdomen  into the right lower quadrant.  It is cramping, sharp, lets up sharp,  finally radiates around towards her back into the buttock area and then  spontaneously disappears.  These episodes last about 15 minutes.  She cannot  think of anything that she does that brings them on and she cannot find any  position or anything to do to make them go away, they just go away on their  own.  She has one to three of these per day and had one of her most  significant ones yesterday, and another smaller one this morning.  They are,  as noted, self-limited.   She does not associate them with any change in her bowel habits or diarrhea.  She has had no nausea or vomiting, fever or chills with any of these  episodes.  In between episodes she says she feels relatively okay.  She has  been eating normally.   PAST MEDICAL HISTORY:  She apparently had a vaginal hysterectomy by Dr.  Stefano Gaul several years ago.   MEDICATIONS:  She takes something for blood pressure, does not have the name  with her right now.   ALLERGIES:  None known.   HABITS:  Smokes 1-2 packs per weeks.  Alcohol none.   FAMILY HISTORY:  She has family members on both her fathers' and mothers'  sides that have had diabetes but other  than that negative.   REVIEW OF SYSTEMS:  HEENT:  Negative.  CHEST:  No cough or shortness of  breath.  HEART:  History of mild hypertension, followed by Dr. Cliffton Asters.  ABDOMEN:  As noted in the HPI.  She has also noted that the last several  years she had some intermittent right upper abdominal pain which is  postprandial.  It is not usually associated with nausea and not particularly  severe just more of an aggravation than anything else.   PHYSICAL EXAMINATION:  GENERAL:  The patient is a generally healthy-  appearing lady who is alert, awake, oriented, and in no distress.  HEENT:  Head is normocephalic.  Eyes are nonicteric, pupils are round and  regular.  NECK:  Supple, no masses or thyromegaly.  LUNGS:  Clear to auscultation.  HEART:  Regular.  No murmurs, rubs, or gallops are heard.  ABDOMEN:  Soft, flat, and essentially nontender now except for a little bit  of tenderness deep in the right pelvis.  There is no rebound and bowel  sounds are normal.  There is no guarding.  EXTREMITIES:  No cyanosis or edema.   LABORATORY AND ACCESSORY DATA:  Data reviewed:  She had a white count done  which was not available when I spoke down with Dr. Cliffton Asters but is now back and  it is 6800.  The hemoglobin is 13.2.   The patient had a CT today and that was with her although I do not have the  official report.  There was some question of her appendix being at slightly  over the upper limit of normal size and perhaps a little bit of slight  stranding.  Also noted was apparently a left ovarian cyst.  I did review the  CT scan personally but do not really see any gross abnormalities and did not  feel like this was likely to represent appendicitis.   IMPRESSION:  1. Lower abdominal pain intermittent of uncertain etiology.  2. Apparent left ovarian cyst by CT scan.  3. Question of biliary colic based on symptoms not related to current     illness.   PLAN:  Given her current clinical appearance being  very benign and her  history not very typical for appendicitis at all, I think she just needs  further evaluation as an outpatient.  I am going to see if we can arrange  both a gallbladder ultrasound tomorrow as well as a pelvic ultrasound and  see what these show before making any further plans.  If these do not help  up work out the etiology of her episodic pain, then perhaps we will need to  get a GI evaluation or reevaluate her for the possibility of a very atypical  appendicitis.                                               Currie Paris, M.D.    CJS/MEDQ  D:  10/22/2002  T:  10/22/2002  Job:  [id number]   cc:   Stacie Acres. White, M.D.  [address]  [place]  [place] [address]  Fax: [phone]

## 2011-02-10 NOTE — H&P (Signed)
[facility]  Patient:    Teresa Grant, Teresa Grant                   MRN: [id number] Adm. Date:  [id number] Disc. Date: [id number] Attending:  Devoria Albe                         History and Physical  HISTORY OF PRESENT ILLNESS:   Ms. Mick is a 50 year old female, para 3-0-1-3, who presents for vaginal hysterectomy because of fibroids, anemia, dysmenorrhea, and menorrhagia.  Her hemoglobin was recently 7.5.  She is currently taking iron.  Pain medications and hormonal therapy have not been effective.  Her endometrial biopsy was benign.  Her Pap smear was within normal limits.  An ultrasound in May of 2002 showed normal ovaries.  The uterus appeared to be normal.  OBSTETRICAL HISTORY:          The patient has had two vaginal deliveries at term and one cesarean section at term.  She had one miscarriage.  PAST MEDICAL HISTORY:         The patient denies hypertension and diabetes. She did have a tubal ligation in 1993 and a cesarean section in 1991.  She is currently taking iron and Excedrin.  DRUG ALLERGIES:               None.  SOCIAL HISTORY:               The patient smokes one half pack of cigarettes each day.  She denies ethanol use and recreational drug use.  REVIEW OF SYSTEMS:            Please see history of present illness.  FAMILY HISTORY:               The patients mother had hypertension.  She died from congestive heart failure.  The patients father died from renal failure. She had a maternal aunt with diabetes.  PHYSICAL EXAMINATION:  VITAL SIGNS:                  Weight is 140 pounds.  HEENT:                        Within normal limits.  CHEST:                        Clear.  HEART:                        Regular rate and rhythm.  BREASTS:                      Without masses.  ABDOMEN:                      Nontender.  EXTREMITIES:                  Within normal limits.  NEUROLOGIC:                   Within normal  limits.  PELVIC:                       External genitalia is normal.  Vagina is normal. Cervix is nontender.  Uterus is 8-10 weeks size, irregular, and firm. Adnexae:  No masses.  Rectovaginal exam confirms.  ASSESSMENT:  1. Fibroid uterus.                               2. Severe anemia.                               3. Dysmenorrhea.                               4. Menorrhagia.  PLAN:                         The patient will undergo a vaginal hysterectomy. She understands the indications for her procedure and she accepts the risks of, but not limited to, anesthetic complications, bleeding, infections, and possible damage to the surrounding organs.DD:  04/16/01 TD:  04/16/01 Job: [id number]

## 2011-02-10 NOTE — Discharge Summary (Signed)
NAMEBICH, Teresa Grant            ACCOUNT NO.:  [id number]   MEDICAL RECORD NO.:  [id number]          PATIENT TYPE:  INP   LOCATION:  4714                         FACILITY:  [facility]   PHYSICIAN:  Madaline Guthrie, M.D.    DATE OF BIRTH:  08-May-1961   DATE OF ADMISSION:  01/05/2006  DATE OF DISCHARGE:  01/08/2006                                 DISCHARGE SUMMARY   DISCHARGE DIAGNOSES:  1.  Atypical chest pain.  2.  Abdominal pain with elevated lipase, but no evidence of pancreatitis.  3.  Diabetes mellitus type 2.  4.  Hypertension.  5.  Dyslipidemia.  6.  Anemia.   DISCHARGE MEDICATIONS:  1.  Lipitor 10 mg 1 tablet p.o. daily.  2.  Diovan 80 mg 1 tab p.o. daily.  3.  Metformin 1000 mg 1 tab p.o. b.i.d.  4.  Glipizide 20 mg 1 tab p.o. at bedtime.  5.  Metoprolol 25 mg 1 tab p.o. b.i.d.  6.  Levsin 0.125 mg 1 tab p.o. q.4 h. p.r.n. abdominal pain.  7.  Aspirin 325 mg p.o. daily.  8.  The patient was advised to stop her hydrochlorothiazide.   CONDITION AT DISCHARGE:  The patient was improved at the time of discharge.  Her chest pain has subsided. Her abdominal pain had also subsided.   She will follow up with [facility] on January 09, 2006 at 9:30 for an  outpatient Cardiolite to follow up on her chest pain and atypical EKG. She  will also follow up with Dr. Orma Flaming on January 16, 2006 at 4:00 p.m. in  regards to her abdominal pain and to follow up on her lipase as well as her  other medical conditions.   PROCEDURES:  1.  Teresa Grant had a chest x-ray done on January 06, 2006 that showed no      active cardiopulmonary disease.  2.  A CT of abdomen and pelvis on January 06, 2006 showed a tiny right lower      pole renal calculus, normal appearance of the pancreas on CT, normal      vascular structures, and a small hepatic lesion near the dome of the      liver just posterior to the right hepatic vein which is likely a benign      hepatic hemangioma.   CONSULTATIONS:  1.   [facility] was consulted, Dr. Andee Lineman and Dr. Gala Romney.   BRIEF ADMITTING HISTORY AND PHYSICAL:  Teresa Grant is a 50-year-  old, African-American female with a past medical history of diabetes,  hypertension, and dyslipidemia who presented with a 38-month history of  epigastric pain, crampy in nature, radiating to the back. She was seen in  the clinic 1 day prior to admission and was found to have an elevated lipase  in the 300s, but a right upper quadrant ultrasound was unremarkable at that  time. She stated her pain had worsened over the past several days. She also  complained of intermittent chest pain that felt more like a heaviness over  her sternum. She denied any nausea, vomiting, or diaphoresis. She did have  shortness of breath that sounded like PND. Her chest pain on admission was  relieved with nitroglycerin.   THE NOTABLE FINDINGS ON PHYSICAL EXAM DURING ADMISSION:  VITAL SIGNS:  Temperature of 98.4, blood pressure 132/86, pulse 67, respirations 16,; 97%  on room air.  GENERAL:  She was in no acute distress.  NECK:  Supple. There was no JVD or lymphadenopathy.  LUNGS:  Clear.  HEART:  Regular rate and rhythm, with no murmurs.  ABDOMEN:  Soft, with good bowel sounds, nondistended, with mild  periumbilical pain on deep palpation.  EXTREMITIES:  Showed no edema.   ADMISSION LABORATORIES:  Were essentially unremarkable.   Please see full admission history and physical for full details.   HOSPITAL COURSE:  1.  Atypical chest pain. As mentioned, the patient had had intermittent      substernal chest pain that felt like pressure. Her admission EKG did      show some ST depression in leads II, III, and AVF that was reversed with      nitroglycerin sublingual. Given her several risk factors, including      hypertension, dyslipidemia, and diabetes mellitus, Cardiology was      consulted. Her serial cardiac enzymes remained negative, but given her      risk  factors and EKG abnormalities Cardiology's initial plan was to do a      cardiac catheterization. However, on reevaluation they felt that her      symptoms were likely we are GI in nature. The patient will follow up      with an outpatient Cardiolite 1 day after discharge on January 09, 2006 at      [facility]. She was discharged on aspirin and a low-dose beta      blocker.  2.  Crampy abdominal pain. The patient has a long history of crampy      abdominal pain, with a negative workup in the past. She was seen 1 day      prior to admission with an elevated lipase but negative right upper      quadrant ultrasound in the clinic. On admission, possible offending      medicines were held, including hydrochlorothiazide. Her abdominal CT did      not show did any evidence of pancreatitis. Repeat lipase measurements      trended down to 44, 1 day prior to discharge. Her liver function tests      remained normal. At this time, it is felt that her abdominal pain may be      irritable bowel syndrome. However, the patient has never had an      endoscopy. We will pursue this as an outpatient, given the resolution of      her lipase. We have advised her to discontinue her hydrochlorothiazide.      Will follow up with her abdominal pain at her hospital follow-up      appointment and obtain a repeat lipase at that time as well. She is      being discharged with a trial of Levsin, an antispasmodic, to see if      there is any improvement in her abdominal pain.  3.  Diabetes mellitus type 2. The patient was admitted with a history of      diabetes type 2 that has been difficult to control. Admission      medications included Glucotrol and metformin. Metformin was held for      possible cardiac catheterization. Her Glucotrol was continued  along with      sliding scale insulin. However, given that her cardiac catheterization      was eventually cancelled, she was restarted on her metformin and her      glipizide at previous doses. Will continue to titrate these medications      for improved control her CBGs as an outpatient.  4.  Hypertension. The patient does have a history of hypertension. She was      recently started on hydrochlorothiazide within the last month out of the      clinic. Her other medication included Diovan 80 mg daily. At the time of      discharge, she is being asked to hold her hydrochlorothiazide, as this      may be the inciting agent of her elevated lipase. She will continue on      Diovan 80 mg daily and metoprolol 25 mg b.i.d.  Will see how this does      for her blood pressure control and titrate as needed.  5.  Dyslipidemia. The patient does have a history of dyslipidemia. She was      maintained on her Lipitor 10 mg daily.  Repeat LFTs during her      hospitalization showed a total cholesterol of 121, HDL 35, LDL 65, and      triglycerides of 103. It is possible that Lipitor could raise her      lipase.  However, she has been on Lipitor for quite some time. Again, we      will follow up on her lipase as an outpatient, but I doubt that Lipitor      is the inciting agent. She is being discharged on this medication, and      we will follow up her fasting lipid panel as needed.  6.  Anemia. The patient does have a slight anemia, with her discharge      hemoglobin 10.9. MCV is 85.3. This may be anemia of chronic disease, but      we will evaluate her as an outpatient. Of note, her fecal occult blood      test has been negative.   DISCHARGE LABORATORIES AND VITALS:  Vitals on the day of discharge include  temperature 98.0, blood pressure of 116/71, pulse of 73, respirations 20,  oxygen saturations 95% on room air. Her last labs include a white blood cell  count of 5.3, hemoglobin of 10.9, platelets 193.  Sodium 142, potassium 4,  chloride 110, bicarbonate 27, BUN 13, creatinine 0.9, glucose of 213. Other  notable labs include discharge lipase of 44, down from 332  on admission.  Amylase was 113. D-dimer was less than 0.22. Total cholesterol 121, HDL 35,  LDL 65, triglycerides 103. Cardiac enzymes negative. There are no pending  labs at the time of this dictation.      Inis Sizer, M.D.      Madaline Guthrie, M.D.  Electronically Signed    DC/MEDQ  D:  01/13/2006  T:  01/15/2006  Job:  [id number]   cc:   Outpatient Clinic   [facility]

## 2011-02-10 NOTE — Op Note (Signed)
[facility]  Patient:    Teresa Grant, Teresa Grant                   MRN: [id number] Proc. Date: 05/07/01 Adm. Date:  [id number] Attending:  Leonard Schwartz                           Operative Report  PREOPERATIVE DIAGNOSES:       1. Fibroid uterus.                               2. Dysmenorrhea.                               3. Menorrhagia.                               4. Anemia (hemoglobin 10.1).  POSTOPERATIVE DIAGNOSES:      1. Fibroid uterus.                               2. Dysmenorrhea.                               3. Menorrhagia.                               4. Anemia (hemoglobin 10.1).                               5. Pelvic adhesions.  PROCEDURE:                    Vaginal hysterectomy with lysis of adhesions.  SURGEON:                      Janine Limbo, M.D.  FIRST ASSISTANT:              Henreitta Leber, P.A.  ANESTHESIA:                   General.  INDICATIONS:                  The patient is a 50 year old female, para 3-0-1-0 who presents with the above-mentioned diagnoses.  Her hemoglobin has been as low as 7.5.  She has not responded to pain medication nor hormonal therapy.  She wishes to proceed with definitive therapy.  The patient understands the indications for her procedure and she accepts the risks of but not limited to anesthetic complications, bleeding, infection and possible damage to surrounding organs.  FINDINGS:                     The uterus was ten weeks in size and contained several fibroids.  There were adhesions present in the posterior cul-de-sac. The fallopian tubes and ovaries appeared normal.  DESCRIPTION OF PROCEDURE:     The patient was taken to the operating room, where a general anesthetic was given.  The patients abdomen, perineum and vagina were prepped with multiple layers of Betadine.  Examination under anesthesia was performed.  A Foley catheter was placed in the bladder.  The patient was  sterilely draped.  A circumferential incision was made around the cervix after injecting the cervix with a diluted solution of Pitressin and saline.  The vaginal mucosa was advanced both anteriorly and posteriorly.  The anterior cul-de-sac and then the posterior cul-de-sac were sharply entered. Adhesions were encountered in the posterior cul-de-sac.  The cul-de-sac was carefully inspected and care was taken not to damage the bowel.  The adhesions were lysed using a combination of blunt and sharp dissection.  Alternating from right to left, the uterosacral ligaments, paracervical tissues, parametrial tissues, and uterine arteries were clamped, cut, sutured and tied securely.  The uterus was inverted through the posterior colpotomy.  The upper pedicles were secured and the uterus was transected from our operative field. We then placed free ties and then suture ligatures around the upper pedicles. Hemostasis was achieved on the left using figure-of-eight sutures.  Hemostasis was then noted to be adequate.  The sutures attached to the uterosacral ligaments were brought out through the vaginal angles and tied securely.  A McCall culdoplasty suture was placed incorporating the uterosacral ligaments bilaterally and the posterior peritoneum.  The operative field was inspected and hemostasis was again noted to be adequate.  The vaginal cuff was then closed using interrupted sutures, incorporating the anterior vaginal mucosa, the anterior peritoneum, the posterior peritoneum, and then the posterior vaginal mucosa.  The McCall coldoplasty suture was the tied securely and the apex of the vagina was noted to elevate into the mid pelvis.  Sponge, needle and instrument counts were correct on two occasions.  Estimated blood loss was less than 50 cc.  The patient tolerated the procedure well.  The patient was returned to the supine position and then taken to the recovery room in stable condition. DD:   05/07/01 TD:  05/07/01 Job: [id number]

## 2011-02-10 NOTE — Consult Note (Signed)
NAME:  Teresa Grant, Teresa Grant            ACCOUNT NO.:  [id number]   MEDICAL RECORD NO.:  [id number]          PATIENT TYPE:  INP   LOCATION:  4714                         FACILITY:  [facility]   PHYSICIAN:  Learta Codding, M.D. LHCDATE OF BIRTH:  Jan 25, 1961   DATE OF CONSULTATION:  01/06/2006  DATE OF DISCHARGE:                                   CONSULTATION   REASON FOR CONSULTATION:  Evaluation of possible acute coronary syndrome in  a 50 year old female with multiple cardiac risk factors.   HISTORY OF PRESENT ILLNESS:  The patient is a 50 year old African American  female with multiple cardiac risk factors including diabetes mellitus,  hypertension, and dyslipidemia.  The patient has a long-standing history of  abdominal pain.  Recently, she was seen in the medicine clinic and I  understand that she has had normal pancreatic function tests.  It is my  understanding that the patient was called and told she had to come into the  hospital for further evaluation.  While in the process of being evaluated,  she also reported substernal chest pain which has been going on for  approximately six months.  She reports that initially her symptoms presented  as pins and needles feeling and then it accelerates into heavy substernal  chest pressure with some radiation to both shoulders.  There is no  associated shortness of breath or diaphoresis.  She stated that her symptoms  appear to be occurring almost on a daily basis but there is no clear  relationship between exertion or rest.  She, also, on several occasions was  awakened from sleep with chest pressure.  The patient, yesterday, during the  interview had an episode of substernal chest pain and EKG was obtained which  showed minor ST segment depression in the inferior leads and T-wave  inversions.  Some of these changes did appear to improve slightly with  sublingual nitroglycerin.  We have now been requested to see the patient for  consultation and  possible cardiac catheterization.   ALLERGIES:  No known drug allergies.   PAST MEDICAL HISTORY:  Diabetes mellitus, hypertension, dyslipidemia,  history of hysterectomy.   MEDICATIONS:  Lipitor 10 mg daily, Diovan 80 mg daily, Metformin 1000 mg  p.o. b.i.d., Glipizide 10 mg p.o. q.h.s., hydrochlorothiazide 12.5 mg p.o.  daily.   SOCIAL HISTORY:  The patient is married, she is a Surveyor, mining.  She  denies any current tobacco use but used to smoke and quit one year ago.   FAMILY HISTORY:  Mother died from heart failure at age 47, father died from  kidney failure at age 51.   REVIEW OF SYSTEMS:  As per HPI.  No nausea and vomiting.  No fever or  chills.  Positive for abdominal pain.  Positive for dyspnea, but no definite  orthopnea or PND.  The patient reports occasional hot flashes, dizziness,  and vertigo.  There is no hematuria, dysuria, or frequency.  Neurologically,  there are no specific complaints.   PHYSICAL EXAMINATION:  VITAL SIGNS:  Blood pressure 109/69, heart rate 71, temperature 97.8.  GENERAL:  Well nourished African American  female in no apparent distress.  HEENT:  Pupils equal, round, reactive to light.  NECK:  Supple, normal carotid upstroke, no carotid bruits.  LUNGS:  Clear bilaterally.  HEART:  Regular rate and rhythm with normal S1 and S2, no murmurs, gallops,  and rubs.  ABDOMEN:  Soft, nontender, no rebound or guarding, good bowel sounds.  EXTREMITIES:  No cyanosis, clubbing, and edema.   LABORATORY DATA:  EKG is as described above.  Hemoglobin 11.8, white count  6.  TSH within normal limits.  Troponins negative x 3.  Hemoglobin A1C 8.4.  CT of the abdomen shows no definite pancreatitis.  It also appears the renal  arteries are patent.   PROBLEM LIST:  1.  Substernal chest pain, rule out acute coronary syndrome.      1.  Moderate ST-T wave change in the inferior leads.      2.  Improvement in symptoms with nitroglycerin.  2.  Multiple cardiac risk  factors.      1.  Diabetes mellitus.      2.  Hypertension.      3.  Dyslipidemia.      4.  Family history of coronary artery disease.  3.  Abdominal pain, rule out pancreatitis per teaching service.  4.  Dyslipidemia.   PLAN:  Will proceed with cardiac catheterization on Monday.  I agree with  the concerning history of substernal chest pain although there are several  atypical features to the patient's story.  The EKG changes are also somewhat  nonspecific but there are, indeed, minor ST-T wave changes with some  improvement with nitroglycerin.  I have made no change in the current  medical regimen at the present time.  I discussed the risks and benefits of  the cardiac catheterization with the patient and she is willing to proceed.      Learta Codding, M.D. [facility]  Electronically Signed     GED/MEDQ  D:  01/06/2006  T:  01/06/2006  Job:  [id number]

## 2011-02-10 NOTE — Discharge Summary (Signed)
[facility]  Patient:    Teresa Grant, Teresa Grant Visit Number: [id number] MRN: [id number]          Service Type: DSU Location: [id number] Attending Physician:  Leonard Schwartz Dictated by:   Henreitta Leber, P.A.-C Adm. Date:  05/07/2001 Disc. Date: 05/08/2001                             Discharge Summary  DISCHARGE DIAGNOSES:          1. Fibroid uterus.                               2. Severe anemia.                               3. Dysmenorrhea.                               4. Menorrhagia.                               5. Pelvic adhesions.  OPERATIONS:                   On August 13 the patient underwent a total vaginal hysterectomy with lysis of adhesions, tolerating all procedures well.  HISTORY OF PRESENT ILLNESS:   Ms. Blume is a 50 year old female, para 3-0-1-3, who presents for vaginal hysterectomy because of uterine fibroids, anemia (most recent hemoglobin 7.5), dysmenorrhea and menorrhagia. Please see patients dictated history and physical exam for details.  PHYSICAL EXAMINATION:         Weight is 140 pounds. General exam was within normal limits. Pelvic exam: EGBUS is within normal limits. Vagina is normal. Cervix is nontender. Uterus is 8 to 10 weeks size, irregular and firm. Adnexa without masses. Rectovaginal confirms.  HOSPITAL COURSE:              On August 13 the patient underwent a total vaginal hysterectomy with lysis of adhesions tolerating procedure well. Postoperative course was unremarkable with patient quickly resuming bowel and bladder function by postoperative day #1 and deemed ready for discharge home. Postoperative hemoglobin 8.3 (preoperative hemoglobin 10.1).  DISCHARGE MEDICATIONS:        1. Vicodin one to two tablets every four to six                                  hours as needed for pain.                               2. Ibuprofen 600 mg one tablet with food every  six hours for five days, then as needed.                               3. Stool softeners 100 mg twice daily until                                  bowel movements are regular.  4. Phenergan 12.5 mg one tablet four times a                                  day, if needed, for nausea.                               5. Iron 325 mg one tablet twice daily for six                                  weeks.  FOLLOWUP INSTRUCTIONS:        The patient is to call [facility] for a six weeks postoperative exam with Dr. Stefano Gaul.  DISCHARGE INSTRUCTIONS:       The patient was given a copy of [facility] postoperative instruction sheet. She was further advised to avoid driving for two weeks, heavy lifting for four weeks, and intercourse for six weeks.  FINAL PATHOLOGY:              Uterus and cervix: The cervix with no pathologic abnormalities, benign secretory endometrium, myometrium with no pathologic abnormalities identified, uterine serosal fibrous adhesions. Dictated by:   Henreitta Leber, P.A.-C Attending Physician:  Leonard Schwartz DD:  05/21/01 TD:  05/21/01 Job: [id number]

## 2011-02-10 NOTE — Assessment & Plan Note (Signed)
[facility]                           GASTROENTEROLOGY OFFICE NOTE   DAI, APEL                   MRN:          [id number]  DATE:06/11/2006                            DOB:          1960/10/14    PROBLEM:  Abdominal pain.   Teresa Grant has returned for reevaluation.  Upper and lower endoscopy  were unremarkable.  On a regimen of Benefiber daily, she is having daily  bowel movements.  She no longer has abdominal pain.  She currently has no GI  complaints.   PHYSICAL EXAMINATION:  Pulse 74.  Blood pressure 124/78.  Weight is 178.   IMPRESSION:  Abdominal pain - secondary to chronic constipation.   RECOMMENDATION:  Continue fiber supplementation indefinitely.                                   Barbette Hair. Arlyce Dice, MD,FACG   RDK/MedQ  DD:  06/11/2006  DT:  06/11/2006  Job #:  [id number]   cc:   Inis Sizer, M.D.

## 2011-02-14 ENCOUNTER — Encounter: Payer: Self-pay | Admitting: Internal Medicine

## 2011-02-28 ENCOUNTER — Other Ambulatory Visit: Payer: Self-pay | Admitting: Internal Medicine

## 2011-04-04 ENCOUNTER — Other Ambulatory Visit: Payer: Self-pay | Admitting: *Deleted

## 2011-04-04 MED ORDER — INSULIN PEN NEEDLE 31G X 8 MM MISC: 1.0000 | Freq: Two times a day (BID) | Status: DC

## 2011-04-07 ENCOUNTER — Other Ambulatory Visit: Payer: Self-pay | Admitting: Internal Medicine

## 2011-04-16 ENCOUNTER — Emergency Department (HOSPITAL_COMMUNITY)
Admission: EM | Admit: 2011-04-16 | Discharge: 2011-04-17 | Disposition: A | Discharge status: Home / Self Care | Payer: BC Managed Care – PPO | Attending: Emergency Medicine | Admitting: Emergency Medicine

## 2011-04-16 DIAGNOSIS — Z79899 Other long term (current) drug therapy: Secondary | ICD-10-CM | POA: Insufficient documentation

## 2011-04-16 DIAGNOSIS — I10 Essential (primary) hypertension: Secondary | ICD-10-CM | POA: Insufficient documentation

## 2011-04-16 DIAGNOSIS — M549 Dorsalgia, unspecified: Secondary | ICD-10-CM | POA: Insufficient documentation

## 2011-04-16 DIAGNOSIS — N390 Urinary tract infection, site not specified: Secondary | ICD-10-CM | POA: Insufficient documentation

## 2011-04-16 DIAGNOSIS — E7800 Pure hypercholesterolemia, unspecified: Secondary | ICD-10-CM | POA: Insufficient documentation

## 2011-04-16 DIAGNOSIS — E119 Type 2 diabetes mellitus without complications: Secondary | ICD-10-CM | POA: Insufficient documentation

## 2011-04-16 DIAGNOSIS — M25559 Pain in unspecified hip: Secondary | ICD-10-CM | POA: Insufficient documentation

## 2011-04-17 LAB — URINALYSIS, ROUTINE W REFLEX MICROSCOPIC
Bilirubin Urine: NEGATIVE
Glucose, UA: >1000 mg/dL — AB
Ketones, ur: NEGATIVE mg/dL
Leukocytes, UA: NEGATIVE
Nitrite: NEGATIVE
Protein, ur: 100 mg/dL — AB
Specific Gravity, Urine: 1.025 (ref 1.005–1.030)
Urobilinogen, UA: 1 mg/dL (ref 0.0–1.0)
pH: 6 (ref 5.0–8.0)

## 2011-04-17 LAB — URINE MICROSCOPIC-ADD ON

## 2011-05-25 ENCOUNTER — Other Ambulatory Visit: Payer: Self-pay | Admitting: Internal Medicine

## 2011-06-19 ENCOUNTER — Other Ambulatory Visit: Payer: Self-pay | Admitting: Family Medicine

## 2011-06-19 DIAGNOSIS — M79606 Pain in leg, unspecified: Secondary | ICD-10-CM

## 2011-06-20 LAB — DIFFERENTIAL
Basophils Absolute: 0
Basophils Relative: 0
Eosinophils Absolute: 0
Eosinophils Relative: 0
Lymphocytes Relative: 10 — ABNORMAL LOW
Lymphs Abs: 0.9
Monocytes Absolute: 0.4
Monocytes Relative: 5
Neutro Abs: 7.6
Neutrophils Relative %: 85 — ABNORMAL HIGH

## 2011-06-20 LAB — URINALYSIS, ROUTINE W REFLEX MICROSCOPIC
Bilirubin Urine: NEGATIVE
Glucose, UA: 250 — AB
Leukocytes, UA: NEGATIVE
Nitrite: NEGATIVE
Protein, ur: >300 — AB
Specific Gravity, Urine: 1.019
Urobilinogen, UA: 1
pH: 6

## 2011-06-20 LAB — COMPREHENSIVE METABOLIC PANEL
ALT: 20
AST: 25
Albumin: 2.9 — ABNORMAL LOW
Alkaline Phosphatase: 87
BUN: 6
CO2: 28
Calcium: 9.4
Chloride: 101
Creatinine, Ser: 1.06
GFR calc Af Amer: >60
GFR calc non Af Amer: 56 — ABNORMAL LOW
Glucose, Bld: 267 — ABNORMAL HIGH
Potassium: 3.6
Sodium: 135
Total Bilirubin: 0.9
Total Protein: 7.5

## 2011-06-20 LAB — CBC
HCT: 34.5 — ABNORMAL LOW
Hemoglobin: 11.5 — ABNORMAL LOW
MCHC: 33.5
MCV: 82.6
Platelets: 182
RBC: 4.17
RDW: 14
WBC: 8.9

## 2011-06-20 LAB — URINE MICROSCOPIC-ADD ON

## 2011-06-22 ENCOUNTER — Ambulatory Visit
Admission: RE | Admit: 2011-06-22 | Discharge: 2011-06-22 | Disposition: A | Discharge status: Home / Self Care | Payer: BC Managed Care – PPO | Source: Ambulatory Visit | Attending: Family Medicine | Admitting: Family Medicine

## 2011-06-22 DIAGNOSIS — M79606 Pain in leg, unspecified: Secondary | ICD-10-CM

## 2011-06-26 LAB — GLUCOSE, CAPILLARY
Glucose-Capillary: 189 — ABNORMAL HIGH
Glucose-Capillary: 137 — ABNORMAL HIGH

## 2011-06-28 LAB — GLUCOSE, CAPILLARY: Glucose-Capillary: 216 — ABNORMAL HIGH

## 2011-07-05 LAB — CBC
HCT: 36.5
Hemoglobin: 12.4
MCHC: 33.8
MCV: 82
Platelets: 250
RBC: 4.46
RDW: 14.7 — ABNORMAL HIGH
WBC: 6.2

## 2011-07-05 LAB — DIFFERENTIAL
Basophils Absolute: 0.1
Basophils Relative: 1
Eosinophils Absolute: 0.1
Eosinophils Relative: 1
Lymphocytes Relative: 25
Lymphs Abs: 1.5
Monocytes Absolute: 0.6
Monocytes Relative: 9
Neutro Abs: 3.9
Neutrophils Relative %: 64

## 2011-07-05 LAB — URINALYSIS, ROUTINE W REFLEX MICROSCOPIC
Bilirubin Urine: NEGATIVE
Glucose, UA: NEGATIVE
Hgb urine dipstick: NEGATIVE
Ketones, ur: NEGATIVE
Nitrite: NEGATIVE
Protein, ur: NEGATIVE
Specific Gravity, Urine: 1.01
Urobilinogen, UA: 0.2
pH: 7.5

## 2011-07-05 LAB — BASIC METABOLIC PANEL
BUN: 13
CO2: 25
Calcium: 9.4
Chloride: 104
Creatinine, Ser: 0.79
GFR calc Af Amer: >60
GFR calc non Af Amer: >60
Glucose, Bld: 147 — ABNORMAL HIGH
Potassium: 3.7
Sodium: 138

## 2011-07-12 LAB — CBC
HCT: 33.8 — ABNORMAL LOW
Hemoglobin: 11.2 — ABNORMAL LOW
MCHC: 33.3
MCV: 81.1
Platelets: 243
RBC: 4.16
RDW: 13.7
WBC: 9.5

## 2011-07-12 LAB — HEPATIC FUNCTION PANEL
ALT: 20
AST: 20
Albumin: 3.4 — ABNORMAL LOW
Alkaline Phosphatase: 114
Bilirubin, Direct: 0.1
Indirect Bilirubin: 0.6
Total Bilirubin: 0.7
Total Protein: 7.1

## 2011-07-12 LAB — POCT I-STAT CREATININE
Creatinine, Ser: 0.8
Operator id: 277751

## 2011-07-12 LAB — I-STAT 8, (EC8 V) (CONVERTED LAB)
BUN: 7
Bicarbonate: 25.2 — ABNORMAL HIGH
Chloride: 103
Glucose, Bld: 315 — ABNORMAL HIGH
HCT: 38
Hemoglobin: 12.9
Operator id: 277751
Potassium: 3.8
Sodium: 138
TCO2: 26
pCO2, Ven: 42.1 — ABNORMAL LOW
pH, Ven: 7.386 — ABNORMAL HIGH

## 2011-07-12 LAB — DIFFERENTIAL
Basophils Absolute: 0
Basophils Relative: 0
Eosinophils Absolute: 0.1
Eosinophils Relative: 1
Lymphocytes Relative: 12
Lymphs Abs: 1.2
Monocytes Absolute: 0.3
Monocytes Relative: 4
Neutro Abs: 7.8 — ABNORMAL HIGH
Neutrophils Relative %: 83 — ABNORMAL HIGH

## 2011-07-12 LAB — LIPASE, BLOOD: Lipase: 36

## 2011-09-04 ENCOUNTER — Other Ambulatory Visit: Payer: Self-pay | Admitting: Internal Medicine

## 2011-09-04 NOTE — Telephone Encounter (Signed)
Patient has not been seen in [facility] since April of 2011; please find out if she is planning to follow up here or if she has established care with another practice.  If she plans to follow up here, we need to confirm her current insulin dose and she needs an appointment in order to get a limited refill.

## 2011-09-04 NOTE — Telephone Encounter (Signed)
Call to pt now going to Dr. Audria Nine on [address]  Message was faxed to [facility] to inform them of this.  Angelina Ok, RN 09/04/2011  2:38 PM.

## 2011-09-15 ENCOUNTER — Ambulatory Visit: Payer: Self-pay | Admitting: Family Medicine

## 2011-09-26 HISTORY — PX: SPINE SURGERY: SHX786

## 2011-10-10 ENCOUNTER — Ambulatory Visit (INDEPENDENT_AMBULATORY_CARE_PROVIDER_SITE_OTHER): Payer: BC Managed Care – PPO | Admitting: Family Medicine

## 2011-10-10 DIAGNOSIS — Z79899 Other long term (current) drug therapy: Secondary | ICD-10-CM

## 2011-10-10 DIAGNOSIS — I10 Essential (primary) hypertension: Secondary | ICD-10-CM

## 2012-01-22 ENCOUNTER — Other Ambulatory Visit: Payer: Self-pay | Admitting: Internal Medicine

## 2012-03-06 ENCOUNTER — Encounter (HOSPITAL_COMMUNITY): Payer: Self-pay | Admitting: Emergency Medicine

## 2012-03-06 ENCOUNTER — Emergency Department (HOSPITAL_COMMUNITY)
Admission: EM | Admit: 2012-03-06 | Discharge: 2012-03-06 | Disposition: A | Discharge status: Home / Self Care | Payer: BC Managed Care – PPO | Attending: Emergency Medicine | Admitting: Emergency Medicine

## 2012-03-06 DIAGNOSIS — M549 Dorsalgia, unspecified: Secondary | ICD-10-CM | POA: Insufficient documentation

## 2012-03-06 DIAGNOSIS — I10 Essential (primary) hypertension: Secondary | ICD-10-CM | POA: Insufficient documentation

## 2012-03-06 DIAGNOSIS — IMO0002 Reserved for concepts with insufficient information to code with codable children: Secondary | ICD-10-CM

## 2012-03-06 DIAGNOSIS — E785 Hyperlipidemia, unspecified: Secondary | ICD-10-CM | POA: Insufficient documentation

## 2012-03-06 DIAGNOSIS — E119 Type 2 diabetes mellitus without complications: Secondary | ICD-10-CM | POA: Insufficient documentation

## 2012-03-06 MED ORDER — ONDANSETRON 4 MG PO TBDP
4.0000 mg | ORAL_TABLET | Freq: Once | ORAL | Status: AC
  Administered 2012-03-06: 4 mg via ORAL
  Filled 2012-03-06: qty 1

## 2012-03-06 MED ORDER — ONDANSETRON HCL 4 MG PO TABS: 4.0000 mg | ORAL_TABLET | Freq: Four times a day (QID) | ORAL | Status: AC

## 2012-03-06 MED ORDER — DIAZEPAM 5 MG PO TABS
5.0000 mg | ORAL_TABLET | Freq: Once | ORAL | Status: AC
  Administered 2012-03-06: 5 mg via ORAL
  Filled 2012-03-06: qty 1

## 2012-03-06 MED ORDER — OXYCODONE-ACETAMINOPHEN 5-325 MG PO TABS
2.0000 | ORAL_TABLET | Freq: Once | ORAL | Status: AC
  Administered 2012-03-06: 2 via ORAL
  Filled 2012-03-06: qty 2

## 2012-03-06 MED ORDER — KETOROLAC TROMETHAMINE 60 MG/2ML IM SOLN
60.0000 mg | Freq: Once | INTRAMUSCULAR | Status: AC
  Administered 2012-03-06: 60 mg via INTRAMUSCULAR
  Filled 2012-03-06: qty 2

## 2012-03-06 MED ORDER — DIAZEPAM 5 MG PO TABS: 10.0000 mg | ORAL_TABLET | Freq: Three times a day (TID) | ORAL | Status: AC | PRN

## 2012-03-06 NOTE — Discharge Instructions (Signed)
Back Pain, Adult Low back pain is very common. About 1 in 5 people have back pain.The cause of low back pain is rarely dangerous. The pain often gets better over time.About half of people with a sudden onset of back pain feel better in just 2 weeks. About 8 in 10 people feel better by 6 weeks.  CAUSES Some common causes of back pain include:  Strain of the muscles or ligaments supporting the spine.   Wear and tear (degeneration) of the spinal discs.   Arthritis.   Direct injury to the back.  DIAGNOSIS Most of the time, the direct cause of low back pain is not known.However, back pain can be treated effectively even when the exact cause of the pain is unknown.Answering your caregiver's questions about your overall health and symptoms is one of the most accurate ways to make sure the cause of your pain is not dangerous. If your caregiver needs more information, he or she may order lab work or imaging tests (X-rays or MRIs).However, even if imaging tests show changes in your back, this usually does not require surgery. HOME CARE INSTRUCTIONS For many people, back pain returns.Since low back pain is rarely dangerous, it is often a condition that people can learn to manageon their own.   Remain active. It is stressful on the back to sit or stand in one place. Do not sit, drive, or stand in one place for more than 30 minutes at a time. Take short walks on level surfaces as soon as pain allows.Try to increase the length of time you walk each day.   Do not stay in bed.Resting more than 1 or 2 days can delay your recovery.   Do not avoid exercise or work.Your body is made to move.It is not dangerous to be active, even though your back may hurt.Your back will likely heal faster if you return to being active before your pain is gone.   Pay attention to your body when you bend and lift. Many people have less discomfortwhen lifting if they bend their knees, keep the load close to their  bodies,and avoid twisting. Often, the most comfortable positions are those that put less stress on your recovering back.   Find a comfortable position to sleep. Use a firm mattress and lie on your side with your knees slightly bent. If you lie on your back, put a pillow under your knees.   Only take over-the-counter or prescription medicines as directed by your caregiver. Over-the-counter medicines to reduce pain and inflammation are often the most helpful.Your caregiver may prescribe muscle relaxant drugs.These medicines help dull your pain so you can more quickly return to your normal activities and healthy exercise.   Put ice on the injured area.   Put ice in a plastic bag.   Place a towel between your skin and the bag.   Leave the ice on for 15 to 20 minutes, 3 to 4 times a day for the first 2 to 3 days. After that, ice and heat may be alternated to reduce pain and spasms.   Ask your caregiver about trying back exercises and gentle massage. This may be of some benefit.   Avoid feeling anxious or stressed.Stress increases muscle tension and can worsen back pain.It is important to recognize when you are anxious or stressed and learn ways to manage it.Exercise is a great option.  SEEK MEDICAL CARE IF:  You have pain that is not relieved with rest or medicine.   You have   pain that does not improve in 1 week.   You have new symptoms.   You are generally not feeling well.  SEEK IMMEDIATE MEDICAL CARE IF:   You have pain that radiates from your back into your legs.   You develop new bowel or bladder control problems.   You have unusual weakness or numbness in your arms or legs.   You develop nausea or vomiting.   You develop abdominal pain.   You feel faint.  Document Released: 09/11/2005 Document Revised: 08/31/2011 Document Reviewed: 01/30/2011 [employer] Patient Information 2012 [employer]. 

## 2012-03-06 NOTE — ED Provider Notes (Signed)
History     CSN: [id number]  Arrival date & time 03/06/12  4098   First MD Initiated Contact with Patient 03/06/12 1003      Chief Complaint  Patient presents with  . Back Pain    (Consider location/radiation/quality/duration/timing/severity/associated sxs/prior treatment) HPI Comments: Pt has known L4-5 bulging disc and bent over yesterday to pick up her purse and her back went out.  Patient is a 51 y.o. female presenting with back pain. The history is provided by the patient.  Back Pain  This is a recurrent problem. The current episode started yesterday. The problem occurs constantly. The problem has not changed since onset.The pain is associated with twisting. The pain is present in the lumbar spine. The quality of the pain is described as shooting. The pain does not radiate. The pain is at a severity of 10/10. The pain is severe. The symptoms are aggravated by twisting and bending. The pain is the same all the time. Stiffness is present all day. Pertinent negatives include no abdominal pain, no bowel incontinence, no bladder incontinence, no dysuria, no pelvic pain, no leg pain, no paresis and no weakness. She has tried muscle relaxants for the symptoms. The treatment provided no relief.    Past Medical History  Diagnosis Date  . Hypertension   . Diabetes mellitus   . HLD (hyperlipidemia)     Past Surgical History  Procedure Date  . Abdominal hysterectomy 2001    Family History  Problem Relation Age of Onset  . Stroke Neg Hx   . Cancer Neg Hx     History  Substance Use Topics  . Smoking status: Not on file  . Smokeless tobacco: Not on file  . Alcohol Use: No    OB History    Grav Para Term Preterm Abortions TAB SAB Ect Mult Living                  Review of Systems  Gastrointestinal: Negative for abdominal pain and bowel incontinence.  Genitourinary: Negative for bladder incontinence, dysuria and pelvic pain.  Musculoskeletal: Positive for back pain.    Neurological: Negative for weakness.  All other systems reviewed and are negative.    Allergies  Lisinopril and Hydrochlorothiazide  Home Medications   Current Outpatient Rx  Name Route Sig Dispense Refill  . AMLODIPINE BESYLATE 10 MG PO TABS Oral Take 10 mg by mouth daily.    . CYCLOBENZAPRINE HCL 10 MG PO TABS Oral Take 10 mg by mouth at bedtime.    Marland Kitchen GLIPIZIDE ER 10 MG PO TB24 Oral Take 10 mg by mouth daily.    . INSULIN GLARGINE 100 UNIT/ML [place] SOLN Subcutaneous Inject 30 Units into the skin at bedtime.    Marland Kitchen METFORMIN HCL 1000 MG PO TABS Oral Take 1,000 mg by mouth 2 (two) times daily with a meal.      BP 130/78  Temp 99.2 F (37.3 C) (Oral)  Resp 18  SpO2 97%  Physical Exam  Nursing note and vitals reviewed. Constitutional: She is oriented to person, place, and time. She appears well-developed and well-nourished. She appears distressed.  HENT:  Head: Normocephalic and atraumatic.  Mouth/Throat: Oropharynx is clear and moist.  Eyes: Conjunctivae and EOM are normal. Pupils are equal, round, and reactive to light.  Neck: Normal range of motion. Neck supple.  Abdominal: Soft. She exhibits no distension. There is no tenderness. There is no rebound and no guarding.  Musculoskeletal: Normal range of motion. She exhibits no edema  and no tenderness.       Lumbar back: She exhibits tenderness, pain and spasm. She exhibits normal pulse.  Neurological: She is alert and oriented to person, place, and time.  Skin: Skin is warm and dry. No rash noted. No erythema.  Psychiatric: She has a normal mood and affect. Her behavior is normal.    ED Course  Procedures (including critical care time)  Labs Reviewed - No data to display No results found.   No diagnosis found.    MDM   Pt with gradual onset of back pain suggestive of radiculopathy.  No neurovascular compromise and no incontinence.  Pt has no infectious sx, hx of CA  or other red flags concerning for pathologic back  pain.  Pt is able to ambulate but is painful.  Normal strength and reflexes on exam.  Denies trauma. Will give pt pain control and to return for developement of above sx.  12:31 PM Persistent pain on re-exam.   1:55 PM On reevaluation patient feeling much better and wants to go home.       Gwyneth Sprout, MD 03/06/12 1356

## 2012-03-06 NOTE — ED Notes (Signed)
Pt. Stated, i went to pick up my pocketbook and my back started to spasm

## 2012-03-13 ENCOUNTER — Other Ambulatory Visit: Payer: Self-pay | Admitting: Orthopedic Surgery

## 2012-03-18 ENCOUNTER — Encounter (HOSPITAL_COMMUNITY)
Admission: RE | Admit: 2012-03-18 | Discharge: 2012-03-18 | Disposition: A | Discharge status: Home / Self Care | Payer: BC Managed Care – PPO | Source: Ambulatory Visit | Attending: Orthopedic Surgery | Admitting: Orthopedic Surgery

## 2012-03-18 ENCOUNTER — Encounter (HOSPITAL_COMMUNITY): Payer: Self-pay

## 2012-03-18 HISTORY — DX: Pneumonia, unspecified organism: J18.9

## 2012-03-18 LAB — CBC
HCT: 38.1 % (ref 36.0–46.0)
Hemoglobin: 12.8 g/dL (ref 12.0–15.0)
MCH: 27.1 pg (ref 26.0–34.0)
MCHC: 33.6 g/dL (ref 30.0–36.0)
MCV: 80.7 fL (ref 78.0–100.0)
Platelets: 265 10*3/uL (ref 150–400)
RBC: 4.72 MIL/uL (ref 3.87–5.11)
RDW: 13.3 % (ref 11.5–15.5)
WBC: 5.4 10*3/uL (ref 4.0–10.5)

## 2012-03-18 LAB — APTT: aPTT: 28 seconds (ref 24–37)

## 2012-03-18 LAB — URINE MICROSCOPIC-ADD ON

## 2012-03-18 LAB — DIFFERENTIAL
Basophils Absolute: 0 10*3/uL (ref 0.0–0.1)
Basophils Relative: 0 % (ref 0–1)
Eosinophils Absolute: 0.1 10*3/uL (ref 0.0–0.7)
Eosinophils Relative: 2 % (ref 0–5)
Lymphocytes Relative: 51 % — ABNORMAL HIGH (ref 12–46)
Lymphs Abs: 2.8 10*3/uL (ref 0.7–4.0)
Monocytes Absolute: 0.4 10*3/uL (ref 0.1–1.0)
Monocytes Relative: 7 % (ref 3–12)
Neutro Abs: 2.1 10*3/uL (ref 1.7–7.7)
Neutrophils Relative %: 40 % — ABNORMAL LOW (ref 43–77)

## 2012-03-18 LAB — COMPREHENSIVE METABOLIC PANEL
ALT: 29 U/L (ref 0–35)
AST: 26 U/L (ref 0–37)
Albumin: 3.8 g/dL (ref 3.5–5.2)
Alkaline Phosphatase: 174 U/L — ABNORMAL HIGH (ref 39–117)
BUN: 8 mg/dL (ref 6–23)
CO2: 28 mEq/L (ref 19–32)
Calcium: 9.8 mg/dL (ref 8.4–10.5)
Chloride: 101 mEq/L (ref 96–112)
Creatinine, Ser: 0.74 mg/dL (ref 0.50–1.10)
GFR calc Af Amer: >90 mL/min (ref >90)
GFR calc non Af Amer: >90 mL/min (ref >90)
Glucose, Bld: 307 mg/dL — ABNORMAL HIGH (ref 70–99)
Potassium: 4.4 mEq/L (ref 3.5–5.1)
Sodium: 139 mEq/L (ref 135–145)
Total Bilirubin: 0.3 mg/dL (ref 0.3–1.2)
Total Protein: 8.1 g/dL (ref 6.0–8.3)

## 2012-03-18 LAB — URINALYSIS, ROUTINE W REFLEX MICROSCOPIC
Bilirubin Urine: NEGATIVE
Glucose, UA: >1000 mg/dL — AB
Ketones, ur: NEGATIVE mg/dL
Leukocytes, UA: NEGATIVE
Nitrite: NEGATIVE
Protein, ur: 100 mg/dL — AB
Specific Gravity, Urine: 1.023 (ref 1.005–1.030)
Urobilinogen, UA: 0.2 mg/dL (ref 0.0–1.0)
pH: 5.5 (ref 5.0–8.0)

## 2012-03-18 LAB — TYPE AND SCREEN
ABO/RH(D): O POS
Antibody Screen: NEGATIVE

## 2012-03-18 LAB — SURGICAL PCR SCREEN
MRSA, PCR: POSITIVE — AB
Staphylococcus aureus: POSITIVE — AB

## 2012-03-18 LAB — PROTIME-INR
INR: 0.82 (ref 0.00–1.49)
Prothrombin Time: 11.5 seconds — ABNORMAL LOW (ref 11.6–15.2)

## 2012-03-18 LAB — ABO/RH: ABO/RH(D): O POS

## 2012-03-18 NOTE — Pre-Procedure Instructions (Signed)
20 Teresa Grant  03/18/2012   Your procedure is scheduled on:  03/20/12  Report to [facility] at 10 AM.  Call this number if you have problems the morning of surgery: [phone]   Remember:   Do not eat food:After Midnight.  May have clear liquids:until Midnight .  Clear liquids include soda, tea, black coffee, apple or grape juice, broth.  Take these medicines the morning of surgery with A SIP OF WATER: norvacs   Do not wear jewelry, make-up or nail polish.  Do not wear lotions, powders, or perfumes. You may wear deodorant.  Do not shave 48 hours prior to surgery. Men may shave face and neck.  Do not bring valuables to the hospital.  Contacts, dentures or bridgework may not be worn into surgery.  Leave suitcase in the car. After surgery it may be brought to your room.  For patients admitted to the hospital, checkout time is 11:00 AM the day of discharge.   Patients discharged the day of surgery will not be allowed to drive home.  Name and phone number of your driver: family  Special Instructions: CHG Shower Use Special Wash: 1/2 bottle night before surgery and 1/2 bottle morning of surgery.   Please read over the following fact sheets that you were given: Pain Booklet, Coughing and Deep Breathing, MRSA Information and Surgical Site Infection Prevention

## 2012-03-18 NOTE — Pre-Procedure Instructions (Signed)
20 Teresa Grant  03/18/2012   Your procedure is scheduled on:  03/18/12  Report to [facility] at 10 AM.  Call this number if you have problems the morning of surgery: [phone]   Remember:   Do not eat food:After Midnight.  May have clear liquids:until Midnight .  Clear liquids include soda, tea, black coffee, apple or grape juice, broth.  Take these medicines the morning of surgery with A SIP OF WATER: norvasc   Do not wear jewelry, make-up or nail polish.  Do not wear lotions, powders, or perfumes. You may wear deodorant.  Do not shave 48 hours prior to surgery. Men may shave face and neck.  Do not bring valuables to the hospital.  Contacts, dentures or bridgework may not be worn into surgery.  Leave suitcase in the car. After surgery it may be brought to your room.  For patients admitted to the hospital, checkout time is 11:00 AM the day of discharge.   Patients discharged the day of surgery will not be allowed to drive home.  Name and phone number of your driver: family  Special Instructions: CHG Shower Use Special Wash: 1/2 bottle night before surgery and 1/2 bottle morning of surgery.   Please read over the following fact sheets that you were given: Pain Booklet, Coughing and Deep Breathing, MRSA Information and Surgical Site Infection Prevention

## 2012-03-19 MED ORDER — POVIDONE-IODINE 7.5 % EX SOLN
Freq: Once | CUTANEOUS | Status: DC
  Filled 2012-03-19: qty 118

## 2012-03-19 MED ORDER — CEFAZOLIN SODIUM-DEXTROSE 2-3 GM-% IV SOLR
2.0000 g | INTRAVENOUS | Status: AC
  Administered 2012-03-20: 2 g via INTRAVENOUS
  Filled 2012-03-19: qty 50

## 2012-03-20 ENCOUNTER — Encounter (HOSPITAL_COMMUNITY): Payer: Self-pay | Admitting: Critical Care Medicine

## 2012-03-20 ENCOUNTER — Ambulatory Visit (HOSPITAL_COMMUNITY): Payer: BC Managed Care – PPO | Admitting: Critical Care Medicine

## 2012-03-20 ENCOUNTER — Ambulatory Visit (HOSPITAL_COMMUNITY)
Admission: RE | Admit: 2012-03-20 | Discharge: 2012-03-20 | Disposition: A | Discharge status: Home / Self Care | Payer: BC Managed Care – PPO | Source: Ambulatory Visit | Attending: Orthopedic Surgery | Admitting: Orthopedic Surgery

## 2012-03-20 ENCOUNTER — Encounter (HOSPITAL_COMMUNITY)
Admission: RE | Disposition: A | Discharge status: Home / Self Care | Payer: Self-pay | Source: Ambulatory Visit | Attending: Orthopedic Surgery

## 2012-03-20 DIAGNOSIS — E785 Hyperlipidemia, unspecified: Secondary | ICD-10-CM | POA: Insufficient documentation

## 2012-03-20 DIAGNOSIS — E119 Type 2 diabetes mellitus without complications: Secondary | ICD-10-CM | POA: Insufficient documentation

## 2012-03-20 DIAGNOSIS — Z01810 Encounter for preprocedural cardiovascular examination: Secondary | ICD-10-CM | POA: Insufficient documentation

## 2012-03-20 DIAGNOSIS — I10 Essential (primary) hypertension: Secondary | ICD-10-CM | POA: Insufficient documentation

## 2012-03-20 DIAGNOSIS — Z01812 Encounter for preprocedural laboratory examination: Secondary | ICD-10-CM | POA: Insufficient documentation

## 2012-03-20 DIAGNOSIS — Z01818 Encounter for other preprocedural examination: Secondary | ICD-10-CM | POA: Insufficient documentation

## 2012-03-20 DIAGNOSIS — M5126 Other intervertebral disc displacement, lumbar region: Secondary | ICD-10-CM | POA: Insufficient documentation

## 2012-03-20 DIAGNOSIS — M5410 Radiculopathy, site unspecified: Secondary | ICD-10-CM

## 2012-03-20 HISTORY — PX: LUMBAR LAMINECTOMY/DECOMPRESSION MICRODISCECTOMY: SHX5026

## 2012-03-20 LAB — GLUCOSE, CAPILLARY
Glucose-Capillary: 314 mg/dL — ABNORMAL HIGH (ref 70–99)
Glucose-Capillary: 264 mg/dL — ABNORMAL HIGH (ref 70–99)
Glucose-Capillary: 114 mg/dL — ABNORMAL HIGH (ref 70–99)

## 2012-03-20 SURGERY — LUMBAR LAMINECTOMY/DECOMPRESSION MICRODISCECTOMY
Anesthesia: General | Laterality: Left | Wound class: Clean

## 2012-03-20 MED ORDER — METHYLPREDNISOLONE ACETATE 40 MG/ML IJ SUSP
40.0000 mg | INTRAMUSCULAR | Status: DC
  Filled 2012-03-20: qty 1

## 2012-03-20 MED ORDER — FENTANYL CITRATE 0.05 MG/ML IJ SOLN
INTRAMUSCULAR | Status: DC | PRN
  Administered 2012-03-20 (×2): 100 ug via INTRAVENOUS
  Administered 2012-03-20 (×4): 50 ug via INTRAVENOUS

## 2012-03-20 MED ORDER — THROMBIN 20000 UNITS EX KIT
PACK | CUTANEOUS | Status: DC | PRN
  Administered 2012-03-20: 20000 [IU] via TOPICAL

## 2012-03-20 MED ORDER — LIDOCAINE HCL (CARDIAC) 20 MG/ML IV SOLN
INTRAVENOUS | Status: DC | PRN
  Administered 2012-03-20: 50 mg via INTRAVENOUS

## 2012-03-20 MED ORDER — MIDAZOLAM HCL 5 MG/5ML IJ SOLN
INTRAMUSCULAR | Status: DC | PRN
  Administered 2012-03-20: 2 mg via INTRAVENOUS

## 2012-03-20 MED ORDER — ONDANSETRON HCL 4 MG/2ML IJ SOLN
INTRAMUSCULAR | Status: DC | PRN
  Administered 2012-03-20 (×2): 4 mg via INTRAVENOUS

## 2012-03-20 MED ORDER — PROMETHAZINE HCL 25 MG/ML IJ SOLN: 6.2500 mg | INTRAMUSCULAR | Status: DC | PRN

## 2012-03-20 MED ORDER — INDIGOTINDISULFONATE SODIUM 8 MG/ML IJ SOLN
INTRAMUSCULAR | Status: DC | PRN
  Administered 2012-03-20: 1 mL via INTRAVENOUS

## 2012-03-20 MED ORDER — PHENYLEPHRINE HCL 10 MG/ML IJ SOLN
10.0000 mg | INTRAVENOUS | Status: DC | PRN
  Administered 2012-03-20: 10 ug/min via INTRAVENOUS

## 2012-03-20 MED ORDER — INSULIN REGULAR HUMAN 100 UNIT/ML IJ SOLN: 8.0000 [IU] | Freq: Once | INTRAMUSCULAR | Status: DC

## 2012-03-20 MED ORDER — HYDROMORPHONE HCL PF 1 MG/ML IJ SOLN
INTRAMUSCULAR | Status: AC
  Filled 2012-03-20: qty 1

## 2012-03-20 MED ORDER — LACTATED RINGERS IV SOLN
INTRAVENOUS | Status: DC | PRN
  Administered 2012-03-20 (×2): via INTRAVENOUS

## 2012-03-20 MED ORDER — LACTATED RINGERS IV SOLN
INTRAVENOUS | Status: DC
  Administered 2012-03-20: 12:00:00 via INTRAVENOUS

## 2012-03-20 MED ORDER — BUPIVACAINE-EPINEPHRINE 0.25% -1:200000 IJ SOLN
INTRAMUSCULAR | Status: DC | PRN
  Administered 2012-03-20: 5 mL

## 2012-03-20 MED ORDER — METHYLPREDNISOLONE ACETATE 40 MG/ML IJ SUSP
INTRAMUSCULAR | Status: DC | PRN
  Administered 2012-03-20: 40 mg via INTRA_ARTICULAR

## 2012-03-20 MED ORDER — INSULIN ASPART 100 UNIT/ML ~~LOC~~ SOLN
8.0000 [IU] | Freq: Once | SUBCUTANEOUS | Status: AC
  Administered 2012-03-20: 8 [IU] via SUBCUTANEOUS
  Filled 2012-03-20: qty 3

## 2012-03-20 MED ORDER — SUCCINYLCHOLINE CHLORIDE 20 MG/ML IJ SOLN
INTRAMUSCULAR | Status: DC | PRN
Start: 2012-03-20 — End: 2012-03-20
  Administered 2012-03-20: 100 mg via INTRAVENOUS

## 2012-03-20 MED ORDER — HYDROMORPHONE HCL PF 1 MG/ML IJ SOLN
0.2500 mg | INTRAMUSCULAR | Status: DC | PRN
  Administered 2012-03-20 (×2): 0.5 mg via INTRAVENOUS

## 2012-03-20 MED ORDER — THROMBIN 20000 UNITS EX KIT
PACK | CUTANEOUS | Status: DC | PRN
  Administered 2012-03-20: 15:00:00 via TOPICAL

## 2012-03-20 MED ORDER — PROPOFOL 10 MG/ML IV EMUL
INTRAVENOUS | Status: DC | PRN
  Administered 2012-03-20: 75 ug/kg/min via INTRAVENOUS

## 2012-03-20 MED ORDER — PROPOFOL 10 MG/ML IV EMUL
INTRAVENOUS | Status: DC | PRN
  Administered 2012-03-20: 60 mg via INTRAVENOUS
  Administered 2012-03-20: 140 mg via INTRAVENOUS
  Administered 2012-03-20: 20 mg via INTRAVENOUS

## 2012-03-20 SURGICAL SUPPLY — 60 items
BENZOIN TINCTURE PRP APPL 2/3 (GAUZE/BANDAGES/DRESSINGS) ×4 IMPLANT
BUR ROUND PRECISION 4.0 (BURR) ×2 IMPLANT
CANISTER SUCTION 2500CC (MISCELLANEOUS) ×2 IMPLANT
CLOTH BEACON ORANGE TIMEOUT ST (SAFETY) ×2 IMPLANT
CLSR STERI-STRIP ANTIMIC 1/2X4 (GAUZE/BANDAGES/DRESSINGS) ×2 IMPLANT
CONT SPEC STER OR (MISCELLANEOUS) IMPLANT
CORDS BIPOLAR (ELECTRODE) ×2 IMPLANT
COVER SURGICAL LIGHT HANDLE (MISCELLANEOUS) ×2 IMPLANT
DRAIN CHANNEL 10F 3/8 F FF (DRAIN) IMPLANT
DRAPE POUCH INSTRU U-SHP 10X18 (DRAPES) ×4 IMPLANT
DRAPE SURG 17X23 STRL (DRAPES) ×8 IMPLANT
DURAPREP 26ML APPLICATOR (WOUND CARE) ×2 IMPLANT
ELECT BLADE 4.0 EZ CLEAN MEGAD (MISCELLANEOUS)
ELECT BLADE 6.5 EXT (BLADE) IMPLANT
ELECT CAUTERY BLADE 6.4 (BLADE) ×2 IMPLANT
ELECT REM PT RETURN 9FT ADLT (ELECTROSURGICAL) ×2
ELECTRODE BLDE 4.0 EZ CLN MEGD (MISCELLANEOUS) IMPLANT
ELECTRODE REM PT RTRN 9FT ADLT (ELECTROSURGICAL) ×1 IMPLANT
EVACUATOR SILICONE 100CC (DRAIN) IMPLANT
FILTER STRAW FLUID ASPIR (MISCELLANEOUS) ×2 IMPLANT
GAUZE SPONGE 4X4 16PLY XRAY LF (GAUZE/BANDAGES/DRESSINGS) ×4 IMPLANT
GLOVE BIO SURGEON STRL SZ8 (GLOVE) ×2 IMPLANT
GLOVE BIOGEL PI IND STRL 8 (GLOVE) ×1 IMPLANT
GLOVE BIOGEL PI INDICATOR 8 (GLOVE) ×1
GOWN STRL NON-REIN LRG LVL3 (GOWN DISPOSABLE) ×4 IMPLANT
IV CATH 14GX2 1/4 (CATHETERS) ×2 IMPLANT
KIT BASIN OR (CUSTOM PROCEDURE TRAY) ×2 IMPLANT
KIT ROOM TURNOVER OR (KITS) ×2 IMPLANT
NEEDLE 18GX1X1/2 (RX/OR ONLY) (NEEDLE) ×2 IMPLANT
NEEDLE HYPO 25GX1X1/2 BEV (NEEDLE) ×2 IMPLANT
NEEDLE SPNL 18GX3.5 QUINCKE PK (NEEDLE) ×4 IMPLANT
NS IRRIG 1000ML POUR BTL (IV SOLUTION) ×2 IMPLANT
PACK LAMINECTOMY ORTHO (CUSTOM PROCEDURE TRAY) ×2 IMPLANT
PACK UNIVERSAL I (CUSTOM PROCEDURE TRAY) ×2 IMPLANT
PAD ARMBOARD 7.5X6 YLW CONV (MISCELLANEOUS) ×4 IMPLANT
PATTIES SURGICAL .5 X.5 (GAUZE/BANDAGES/DRESSINGS) IMPLANT
PATTIES SURGICAL .5 X1 (DISPOSABLE) ×2 IMPLANT
PATTIES SURGICAL 1X1 (DISPOSABLE) IMPLANT
SPONGE GAUZE 4X4 12PLY (GAUZE/BANDAGES/DRESSINGS) ×2 IMPLANT
SPONGE INTESTINAL PEANUT (DISPOSABLE) ×2 IMPLANT
STRIP CLOSURE SKIN 1/2X4 (GAUZE/BANDAGES/DRESSINGS) ×2 IMPLANT
SURGIFLO TRUKIT (HEMOSTASIS) IMPLANT
SUT ETHILON 3 0 FSL (SUTURE) IMPLANT
SUT MNCRL AB 3-0 PS2 18 (SUTURE) ×2 IMPLANT
SUT VIC AB 0 CT1 27 (SUTURE)
SUT VIC AB 0 CT1 27XBRD ANBCTR (SUTURE) IMPLANT
SUT VIC AB 0 CT2 27 (SUTURE) ×2 IMPLANT
SUT VIC AB 1 CT1 18XCR BRD 8 (SUTURE) ×1 IMPLANT
SUT VIC AB 1 CT1 8-18 (SUTURE) ×1
SUT VIC AB 2-0 CT2 18 VCP726D (SUTURE) ×2 IMPLANT
SYR 20CC LL (SYRINGE) IMPLANT
SYR BULB IRRIGATION 50ML (SYRINGE) ×2 IMPLANT
SYR CONTROL 10ML LL (SYRINGE) ×2 IMPLANT
SYR TB 1ML 26GX3/8 SAFETY (SYRINGE) ×4 IMPLANT
SYR TB 1ML LUER SLIP (SYRINGE) ×4 IMPLANT
TAPE CLOTH SURG 4X10 WHT LF (GAUZE/BANDAGES/DRESSINGS) ×2 IMPLANT
TOWEL OR 17X24 6PK STRL BLUE (TOWEL DISPOSABLE) ×2 IMPLANT
TOWEL OR 17X26 10 PK STRL BLUE (TOWEL DISPOSABLE) ×2 IMPLANT
WATER STERILE IRR 1000ML POUR (IV SOLUTION) ×2 IMPLANT
YANKAUER SUCT BULB TIP NO VENT (SUCTIONS) ×2 IMPLANT

## 2012-03-20 NOTE — Transfer of Care (Signed)
Immediate Anesthesia Transfer of Care Note  Patient: Teresa Grant  Procedure(s) Performed: Procedure(s) (LRB): LUMBAR LAMINECTOMY/DECOMPRESSION MICRODISCECTOMY (Left)  Patient Location: PACU  Anesthesia Type: General  Level of Consciousness: awake, alert  and oriented  Airway & Oxygen Therapy: Patient connected to nasal cannula oxygen  Post-op Assessment: Report given to PACU RN, Post -op Vital signs reviewed and stable and Patient moving all extremities X 4  Post vital signs: Reviewed and stable  Complications: No apparent anesthesia complications

## 2012-03-20 NOTE — Anesthesia Postprocedure Evaluation (Signed)
  Anesthesia Post-op Note  Patient: Teresa Grant  Procedure(s) Performed: Procedure(s) (LRB): LUMBAR LAMINECTOMY/DECOMPRESSION MICRODISCECTOMY (Left)  Patient Location: PACU  Anesthesia Type: General  Level of Consciousness: awake  Airway and Oxygen Therapy: Patient Spontanous Breathing and Patient connected to nasal cannula oxygen  Post-op Pain: none  Post-op Assessment: Post-op Vital signs reviewed  Post-op Vital Signs: Reviewed  Complications: No apparent anesthesia complications

## 2012-03-20 NOTE — Op Note (Signed)
NAMEJAQUAYLA, HEGE NO.:  [id number]  MEDICAL RECORD NO.:  [id number]  LOCATION:  [facility]                         FACILITY:  [facility]  PHYSICIAN:  Estill Bamberg, MD      DATE OF BIRTH:  [birth year]  DATE OF PROCEDURE:  03/20/2012 DATE OF DISCHARGE:                              OPERATIVE REPORT   PREOPERATIVE DIAGNOSES: 1. Left-sided L4 radiculopathy. 2. Foraminal/extraforaminal left-sided L4-5 disk herniation resulting     in left-sided L4 radiculopathy.  POSTOPERATIVE DIAGNOSES: 1. Left-sided L4 radiculopathy. 2. Foraminal/extraforaminal left-sided L4-5 disk herniation resulting     in left-sided L4 radiculopathy.  PROCEDURE:  Left-sided L4-5 microdiskectomy via a far-lateral Wiltse approach.  SURGEON:  Estill Bamberg, MD  ASSISTANT:  None.  ANESTHESIA:  General endotracheal anesthesia.  COMPLICATIONS:  None.  DISPOSITION:  Stable.  ESTIMATED BLOOD LOSS:  Minimal.  INDICATIONS FOR PROCEDURE:  Briefly, Ms. Sahlin is a very pleasant 71- year-old female who I initially evaluated close to 1 year ago back on June 30, 2011.  At that time, the patient was evaluated by me and noted to have quadriceps weakness on the left side as well as severe pain in the left leg.  I did review an MRI, which is notable for a foraminal/extraforaminal disk herniation clearly causing compression of the left-sided L4 nerve.  We did go forward with physical therapy and a left-sided L4 selective nerve block.  The patient was very clear in stating that the left-sided L4 selective nerve block did entirely and completely alleviate her pain, however, the relief was only temporary. Given the patient's failure of conservative care and the location of the disk herniation, I did discuss going forward with a left-sided transforaminal lumbar interbody fusion, and I thoroughly decompressed the nerve, given the location of the herniation.  However, this procedure was denied after  multiple attempts with her insurance company. We therefore did make a decision to go forward with a microdiskectomy via a Wiltse approach.  The patient fully understood the risks and limitations of the procedure.  The patient also fully understood that this is a less thorough way to decompress the nerve and there is more manipulation of the nerves involved, which can result in less improvement in her pain and increased risk of nerve injury, given the additional manipulation and retraction required for this procedure.  The patient was therefore brought to Surgery for the procedure reflected above.  OPERATIVE DETAILS:  On March 20, 2012, the patient was brought to Surgery and general endotracheal anesthesia was administered.  The patient was placed prone on a well-padded flat Jackson bed with a Wilson frame. Antibiotics were given and a time-out procedure was performed.  I then made a left-sided vertical incision over the region of the L4-5 interspace.  The Wiltse plane was identified and explored, and the transverse processes of L4 and L5 were readily encountered.  A Self- retaining McCullough retractor was placed.  I did identify the pars interarticularis of L5 and the intertransverse ligament.  The transverse ligament was taken down using series of curettes.  The exiting L4 nerve was readily identified, as was the L3 nerve in the more lateral position.  I did use a Kerrison  punch to remove the lateral aspect of the pars interarticularis.  Again, the exiting L4 nerve was readily visualized and noted to be under excessive tension.  I did attempt to mobilize the nerve laterally and superiorly, however, the nerve was under very excessive tension and this did take a meticulous amount of time and a meticulous amount of care to safely and delicately provide the appropriate amount of retraction to safely remove the disk underneath it.  There were multiple retroperitoneal bleeders, which  were addressed using bipolar electrocautery.  I did use a micro nerve hook to release adhesions between the exiting nerve and the surrounding tissue. Again, I did spend approximately 60 minutes taking down all the adhesions around the nerve in order to optimally gained safe mobilization of the nerve.  I was ultimately able to do this, and the nerve was gently retracted laterally.  A prominent disk protrusion was readily noted beneath the disk.  Again, it was very difficult to access this aspect of the disk, given the excessive pressure on the nerve.  I was, however, able to use a reverse angled Epstein curette to displaced the disk fragments into the intervertebral space.  The disk fragments were then removed using a series of pituitaries.  I then continue to evaluate the nerve and at this point, I was able to identify that the nerve was free of any undue pressure.  I then used a Public house manager to follow the lateral aspect of the disk more anteriorly and I was able to confirm that there was no additional pressure on the nerve.  Also of note, I did use neurologic monitoring throughout the surgery and there was no abnormal activity noted throughout the surgery.  At this point, I copiously irrigated the wound.  I then infiltrated 40 mg of Depo-Medrol about the retroperitoneal space in the region of the exiting L4 nerve. The fascia was then closed using #1 Vicryl.  The subcutaneous layer was then closed using 2-0 Vicryl and the skin was closed using 3-0 Monocryl. Benzoin and Steri-Strips were applied followed by sterile dressing.  All instrument counts were correct at the termination of the procedure.     Estill Bamberg, MD     MD/MEDQ  D:  03/20/2012  T:  03/20/2012  Job:  [id number]  cc:   Maurice March, M.D.

## 2012-03-20 NOTE — H&P (Signed)
PREOPERATIVE H&P  Chief Complaint: left leg pain  HPI: Teresa Grant is a 51 y.o. female who presents with left leg pain  Past Medical History  Diagnosis Date  . Hypertension   . Diabetes mellitus   . HLD (hyperlipidemia)   . Pneumonia     7 yrs ago   Past Surgical History  Procedure Date  . Abdominal hysterectomy 2001   History   Social History  . Marital Status: Married    Spouse Name: N/A    Number of Children: N/A  . Years of Education: N/A   Social History Main Topics  . Smoking status: Never Smoker   . Smokeless tobacco: Not on file  . Alcohol Use: No  . Drug Use: No  . Sexually Active: Yes   Other Topics Concern  . Not on file   Social History Narrative  . No narrative on file   Family History  Problem Relation Age of Onset  . Stroke Neg Hx   . Cancer Neg Hx    Allergies  Allergen Reactions  . Lisinopril Cough  . Hydrochlorothiazide Other (See Comments)    pancreatitis   Prior to Admission medications   Medication Sig Start Date End Date Taking? Authorizing Provider  amLODipine (NORVASC) 10 MG tablet Take 10 mg by mouth every evening.     Historical Provider, MD  cyclobenzaprine (FLEXERIL) 10 MG tablet Take 10 mg by mouth at bedtime.    Historical Provider, MD  glipiZIDE (GLUCOTROL XL) 10 MG 24 hr tablet Take 10 mg by mouth daily.    Historical Provider, MD  insulin glargine (LANTUS) 100 UNIT/ML injection Inject 30 Units into the skin at bedtime.    Historical Provider, MD  metFORMIN (GLUCOPHAGE) 1000 MG tablet Take 1,000 mg by mouth 2 (two) times daily with a meal.    Historical Provider, MD  metoprolol succinate (TOPROL-XL) 100 MG 24 hr tablet Take 100 mg by mouth every evening. Take with or immediately following a meal.    Historical Provider, MD  rosuvastatin (CRESTOR) 10 MG tablet Take 10 mg by mouth every evening.    Historical Provider, MD     All other systems have been reviewed and were otherwise negative with the exception of those  mentioned in the HPI and as above.  Physical Exam: There were no vitals filed for this visit.  General: Alert, no acute distress Cardiovascular: No pedal edema Respiratory: No cyanosis, no use of accessory musculature GI: No organomegaly, abdomen is soft and non-tender Skin: No lesions in the area of chief complaint Neurologic: Sensation intact distally Psychiatric: Patient is competent for consent with normal mood and affect Lymphatic: No axillary or cervical lymphadenopathy  MUSCULOSKELETAL: + SLR on left  Assessment/Plan: Left leg pain Plan for Procedure(s): LUMBAR LAMINECTOMY/DECOMPRESSION MICRODISCECTOMY   Emilee Hero, MD 03/20/2012 8:08 AM

## 2012-03-20 NOTE — Anesthesia Preprocedure Evaluation (Addendum)
Anesthesia Evaluation  Patient identified by MRN, date of birth, ID band Patient awake    Reviewed: Allergy & Precautions, H&P , NPO status , Patient's Chart, lab work & pertinent test results  Airway Mallampati: II TM Distance: >3 FB Neck ROM: Full    Dental  (+) Upper Dentures and Lower Dentures   Pulmonary          Cardiovascular hypertension,     Neuro/Psych    GI/Hepatic   Endo/Other  Diabetes mellitus-, Poorly Controlled, Insulin Dependent and Oral Hypoglycemic Agents  Renal/GU      Musculoskeletal   Abdominal   Peds  Hematology   Anesthesia Other Findings   Reproductive/Obstetrics                          Anesthesia Physical Anesthesia Plan  ASA: II  Anesthesia Plan: General   Post-op Pain Management:    Induction: Intravenous  Airway Management Planned: Oral ETT  Additional Equipment:   Intra-op Plan:   Post-operative Plan: Extubation in OR  Informed Consent: I have reviewed the patients History and Physical, chart, labs and discussed the procedure including the risks, benefits and alternatives for the proposed anesthesia with the patient or authorized representative who has indicated his/her understanding and acceptance.   Dental advisory given  Plan Discussed with: CRNA, Anesthesiologist and Surgeon  Anesthesia Plan Comments:         Anesthesia Quick Evaluation

## 2012-03-20 NOTE — Anesthesia Procedure Notes (Signed)
Procedure Name: Intubation Date/Time: 03/20/2012 1:16 PM Performed by: Elon Alas Pre-anesthesia Checklist: Patient identified, Timeout performed, Emergency Drugs available, Suction available and Patient being monitored Patient Re-evaluated:Patient Re-evaluated prior to inductionOxygen Delivery Method: Circle system utilized Preoxygenation: Pre-oxygenation with 100% oxygen Intubation Type: IV induction and Cricoid Pressure applied Ventilation: Mask ventilation without difficulty and Oral airway inserted - appropriate to patient size Laryngoscope Size: Mac and 3 Grade View: Grade I Tube type: Oral Tube size: 7.5 mm Number of attempts: 1 Airway Equipment and Method: Stylet Placement Confirmation: ETT inserted through vocal cords under direct vision,  positive ETCO2 and breath sounds checked- equal and bilateral Secured at: 22 cm Tube secured with: Tape Dental Injury: Teeth and Oropharynx as per pre-operative assessment

## 2012-03-20 NOTE — Preoperative (Signed)
Beta Blockers   Reason not to administer Beta Blockers:Not Applicable 

## 2012-03-20 NOTE — Progress Notes (Signed)
Spoke with dr. Yevette Edwards regarding pt weakness in left leg. No new orders.

## 2012-03-21 LAB — GLUCOSE, CAPILLARY
Glucose-Capillary: 175 mg/dL — ABNORMAL HIGH (ref 70–99)
Glucose-Capillary: 136 mg/dL — ABNORMAL HIGH (ref 70–99)

## 2012-03-22 ENCOUNTER — Encounter (HOSPITAL_COMMUNITY): Payer: Self-pay | Admitting: Orthopedic Surgery

## 2012-04-22 ENCOUNTER — Other Ambulatory Visit: Payer: Self-pay | Admitting: Family Medicine

## 2012-05-24 ENCOUNTER — Other Ambulatory Visit: Payer: Self-pay | Admitting: Physician Assistant

## 2012-06-19 ENCOUNTER — Encounter: Payer: Self-pay | Admitting: Family Medicine

## 2012-06-19 ENCOUNTER — Ambulatory Visit (INDEPENDENT_AMBULATORY_CARE_PROVIDER_SITE_OTHER): Payer: BC Managed Care – PPO | Admitting: Family Medicine

## 2012-06-19 VITALS — BP 122/88 | HR 81 | Temp 98.3°F | Resp 16 | Ht 61.5 in | Wt 170.0 lb

## 2012-06-19 DIAGNOSIS — Z111 Encounter for screening for respiratory tuberculosis: Secondary | ICD-10-CM

## 2012-06-19 DIAGNOSIS — I10 Essential (primary) hypertension: Secondary | ICD-10-CM

## 2012-06-19 DIAGNOSIS — IMO0001 Reserved for inherently not codable concepts without codable children: Secondary | ICD-10-CM

## 2012-06-19 LAB — POCT GLYCOSYLATED HEMOGLOBIN (HGB A1C): Hemoglobin A1C: 8.7

## 2012-06-19 MED ORDER — INSULIN GLARGINE 100 UNIT/ML ~~LOC~~ SOLN: 30.0000 [IU] | Freq: Every day | SUBCUTANEOUS | Status: DC

## 2012-06-19 MED ORDER — AMLODIPINE BESYLATE 10 MG PO TABS: 10.0000 mg | ORAL_TABLET | Freq: Every day | ORAL | Status: DC

## 2012-06-19 MED ORDER — GLIPIZIDE ER 10 MG PO TB24: 10.0000 mg | ORAL_TABLET | Freq: Every day | ORAL | Status: DC

## 2012-06-19 MED ORDER — METFORMIN HCL 1000 MG PO TABS: 1000.0000 mg | ORAL_TABLET | Freq: Two times a day (BID) | ORAL | Status: DC

## 2012-06-19 MED ORDER — ROSUVASTATIN CALCIUM 10 MG PO TABS: 10.0000 mg | ORAL_TABLET | Freq: Every evening | ORAL | Status: DC

## 2012-06-19 MED ORDER — METOPROLOL SUCCINATE ER 100 MG PO TB24: 100.0000 mg | ORAL_TABLET | Freq: Every evening | ORAL | Status: DC

## 2012-06-19 NOTE — Patient Instructions (Addendum)

## 2012-06-19 NOTE — Progress Notes (Signed)
S: This 51 y.o. AA has Type II DM and is on Insulin. She is on Insulin and does not eat regular meals. 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 She has been out of work x 1 year, having had lumbar disc surgery performed by Careers adviser at [facility]. Pt has had no known exposure to TB, no recent travel to any areas where TB is endemic and has no symptoms c/w TB. She has never had a +PPD reaction.  ROS: Negative for unexpected weight change, diaphoresis, fever or chills, CP or tightness, edema, palpitations, HA, dizziness, numbness, syncope. Has some occasional fatigue and weakness when sugars are below 100.  O:  Filed Vitals:   06/19/12 1032  BP: 122/88  Pulse: 81  Temp: 98.3 F (36.8 C)  Resp: 16   GEN: In NAD; WN,WD. HENT: [place]/AT; EOMI, conj/scl clear. COR: RRR. LUNGS: Normal resp rate and effort. NEURO: A&O x 3; CNs intact; otherwise, nonfocal.   Results for orders placed in visit on 06/19/12  POCT GLYCOSYLATED HEMOGLOBIN (HGB A1C)      Component Value Range   Hemoglobin A1C 8.7        A/P: 1. 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 Pt is aware of the symptoms of hypoglycemia.  2. Screening for tuberculosis  TB Skin Test placed; RTC in 48-72 hours for reading.   Pt will receive Flu vaccine at work; it is given by her employer [facility] [employer]).

## 2012-06-21 ENCOUNTER — Ambulatory Visit (INDEPENDENT_AMBULATORY_CARE_PROVIDER_SITE_OTHER): Payer: BC Managed Care – PPO

## 2012-06-21 DIAGNOSIS — Z111 Encounter for screening for respiratory tuberculosis: Secondary | ICD-10-CM

## 2012-06-21 LAB — TB SKIN TEST
Induration: 0 mm
TB Skin Test: NEGATIVE

## 2012-06-23 DIAGNOSIS — M5136 Other intervertebral disc degeneration, lumbar region: Secondary | ICD-10-CM | POA: Insufficient documentation

## 2012-07-02 ENCOUNTER — Encounter: Payer: Self-pay | Admitting: Family Medicine

## 2012-07-02 ENCOUNTER — Ambulatory Visit (INDEPENDENT_AMBULATORY_CARE_PROVIDER_SITE_OTHER): Payer: BC Managed Care – PPO | Admitting: Family Medicine

## 2012-07-02 VITALS — BP 124/82 | HR 76 | Temp 98.0°F | Resp 16 | Ht 62.0 in | Wt 169.8 lb

## 2012-07-02 DIAGNOSIS — Z0289 Encounter for other administrative examinations: Secondary | ICD-10-CM

## 2012-07-02 DIAGNOSIS — Z029 Encounter for administrative examinations, unspecified: Secondary | ICD-10-CM

## 2012-07-02 DIAGNOSIS — Z23 Encounter for immunization: Secondary | ICD-10-CM

## 2012-07-02 MED ORDER — INSULIN GLARGINE 100 UNIT/ML ~~LOC~~ SOLN: 30.0000 [IU] | Freq: Every day | SUBCUTANEOUS | Status: DC

## 2012-07-02 NOTE — Patient Instructions (Addendum)
Diabetes- check fasting blood sugar every morning; Goal is to get fasting blood sugar down to ~ 100. Make sure you are not skipping meals and try to check your blood sugar twice a day some days- check before lunch or dinner sometimes. It is also a good idea to check before bedtime so that you will know what your sugar is before you go to sleep. If your bedtime sugar is below 90, you may want to have a snack so that your sugar does not drop too low while you are asleep.  See you in December !

## 2012-07-02 NOTE — Progress Notes (Signed)
  Subjective:    Patient ID: Teresa Grant, female    DOB: 12-21-1960, 51 y.o.   MRN: [id number]  HPI  This 51 y.o. 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 She has lumbar spine surgery in June 2013  ("I feel like a new person") and has been released to return to work by Dr. Yevette Edwards.  She has made some improvements re: DM - better nutrition throughout the day and checking BS.   Review of Systems  Constitutional: Negative.   HENT: Negative.   Eyes: Negative.   Respiratory: Negative.   Cardiovascular: Negative.   Gastrointestinal: Negative.   Genitourinary: Negative.   Musculoskeletal: Negative.   Skin: Negative.   Neurological: Negative.   Hematological: Negative.   Psychiatric/Behavioral: Negative.        Objective:   Physical Exam  Vitals reviewed. Constitutional: She is oriented to person, place, and time. She appears well-developed and well-nourished. No distress.  HENT:  Head: Normocephalic and atraumatic.  Right Ear: External ear normal.  Left Ear: External ear normal.  Nose: Nose normal.  Mouth/Throat: Oropharynx is clear and moist.  Eyes: Conjunctivae normal, EOM and lids are normal. Pupils are equal, round, and reactive to light.  Cardiovascular: Normal rate, regular rhythm, normal heart sounds and intact distal pulses.  Exam reveals no gallop and no friction rub.   No murmur heard. Pulmonary/Chest: Effort normal and breath sounds normal. No respiratory distress.  Abdominal: Bowel sounds are normal. She exhibits no mass. There is no tenderness. There is no guarding.  Musculoskeletal: Normal range of motion. She exhibits no edema and no tenderness.       Lumbar spine- nontender well healed scar on right; forward flexion to 90 degrees w/o difficulty.  Neurological: She is alert and oriented to person, place, and time. She has normal reflexes. No cranial nerve deficit.  Coordination normal.  Skin: Skin is warm and dry.  Psychiatric: She has a normal mood and affect. Her behavior is normal. Judgment and thought content normal.          Assessment & Plan:   1. Encounter for administrative examinations    Form completed    Pt received Tdap today.   Pt can receive Influenza vaccine through [facility].

## 2012-08-11 ENCOUNTER — Telehealth: Payer: Self-pay

## 2012-08-11 NOTE — Telephone Encounter (Signed)
Can we refill? 

## 2012-08-11 NOTE — Telephone Encounter (Signed)
Pt. Just took her last metphormin and needs a refill.   She sees Dr. Audria Nine and the pharmacy says she would have to get approval on the next business day.  The pt takes this medicine twice a day and wants [place] to please fill it.  She had an appointment set up with Audria Nine for December, but received a letter from [place] cancelling the appointment.  Please refill at least enough to get her by until she can come in.  [phone]

## 2012-08-12 NOTE — Telephone Encounter (Signed)
Patient was able to get filled and will double check her bottle for refills and will let us know if not correct.

## 2012-08-12 NOTE — Telephone Encounter (Signed)
On 06/19/2012, Dr. Audria Nine wrote for #60, RF x 5, so she should have refills on file at the pharmacy.

## 2012-09-12 ENCOUNTER — Ambulatory Visit: Payer: BC Managed Care – PPO | Admitting: Family Medicine

## 2012-09-13 ENCOUNTER — Ambulatory Visit (INDEPENDENT_AMBULATORY_CARE_PROVIDER_SITE_OTHER): Payer: BC Managed Care – PPO | Admitting: Family Medicine

## 2012-09-13 VITALS — BP 120/83 | HR 79 | Temp 97.8°F | Resp 16 | Ht 62.0 in | Wt 167.0 lb

## 2012-09-13 DIAGNOSIS — I10 Essential (primary) hypertension: Secondary | ICD-10-CM

## 2012-09-13 DIAGNOSIS — IMO0001 Reserved for inherently not codable concepts without codable children: Secondary | ICD-10-CM

## 2012-09-13 DIAGNOSIS — J209 Acute bronchitis, unspecified: Secondary | ICD-10-CM

## 2012-09-13 DIAGNOSIS — E785 Hyperlipidemia, unspecified: Secondary | ICD-10-CM

## 2012-09-13 LAB — LIPID PANEL
Cholesterol: 127 mg/dL (ref 0–200)
HDL: 45 mg/dL (ref >39)
LDL Cholesterol: 68 mg/dL (ref 0–99)
Total CHOL/HDL Ratio: 2.8 Ratio
Triglycerides: 71 mg/dL (ref <150)
VLDL: 14 mg/dL (ref 0–40)

## 2012-09-13 LAB — BASIC METABOLIC PANEL
BUN: 11 mg/dL (ref 6–23)
CO2: 26 mEq/L (ref 19–32)
Calcium: 9.8 mg/dL (ref 8.4–10.5)
Chloride: 104 mEq/L (ref 96–112)
Creat: 0.75 mg/dL (ref 0.50–1.10)
Glucose, Bld: 119 mg/dL — ABNORMAL HIGH (ref 70–99)
Potassium: 3.9 mEq/L (ref 3.5–5.3)
Sodium: 142 mEq/L (ref 135–145)

## 2012-09-13 LAB — POCT GLYCOSYLATED HEMOGLOBIN (HGB A1C): Hemoglobin A1C: 8.3

## 2012-09-13 LAB — ALT: ALT: 19 U/L (ref 0–35)

## 2012-09-13 MED ORDER — METFORMIN HCL 1000 MG PO TABS: 1000.0000 mg | ORAL_TABLET | Freq: Two times a day (BID) | ORAL | Status: DC

## 2012-09-13 MED ORDER — GLIPIZIDE ER 10 MG PO TB24: 10.0000 mg | ORAL_TABLET | Freq: Every day | ORAL | Status: DC

## 2012-09-13 MED ORDER — AMLODIPINE BESYLATE 10 MG PO TABS: 10.0000 mg | ORAL_TABLET | Freq: Every day | ORAL | Status: DC

## 2012-09-13 MED ORDER — ROSUVASTATIN CALCIUM 10 MG PO TABS: 10.0000 mg | ORAL_TABLET | Freq: Every evening | ORAL | Status: DC

## 2012-09-13 NOTE — Progress Notes (Signed)
S: Pt is a 51 y.o. AA female who is here for DM follow-up. She has family illness- husband and daughter with URI symptoms. Pt w/ URI symptoms x 5 days; cough is prod (phlegm) but improving w/ Mucinex. She denies n/v and appetite improving. She does FSBS and values have been good most of the time; she acknowledges some elevations associated with poor snacking choices. 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  With regards to Lumbar Laminectomy procedure performed this summer- pt is doing well and is pain-free.  ROS: As per HPI; she denies unexplained weight change, chills, sinus pain, epistaxis, CP or tightness, SOB or DOE, hemoptysis, diarrhea, dizziness, numbness, weakness or syncope.  PMHx, Soc Hx and Fam Hx reviewed.  O:  Filed Vitals:   09/13/12 1049  BP: 120/83  Pulse: 79  Temp: 97.8 F (36.6 C)  Resp: 16  Height: 5\' 2"  (1.575 m)  Weight: 167 lb (75.751 kg)                            Weight in Sept = 170 lbs   GEN: In NAD; WN,WD. HEENT: [place]/AT; EOMI w/ injected conj but clear sclerae. EACs/TMs normal. Sinuses NT. Post ph erythematous w/o exudate; otherwise moist and clear. NECK: Supple w/o LAN or TMG. COR: RRR.; no edema. LUNGS: CTA w/o rhonchi or wheezes. No chest tenderness. SKIN: W&D; no rashes or erythema. NEURO: A&O x 3; CNs intact. Nonfocal.  A1c= 8.3%  06/07/2011: Microalbumin= 39.68 mg/dL  (pt cannot tolerate ACEI due to cough side effect; consider trial of ARB).  A/P:  1. Type II or unspecified type diabetes mellitus without mention of complication, uncontrolled  Continue current medications- Metformin, Glipizide and Insulin hs Encouraged better nutrition choices esp for snacking. Basic metabolic panel, Lipid panel  2. HTN, goal below 140/80 - controlled Continue current medication  3. Hyperlipidemia  Lipid panel, ALT  4. Bronchitis, acute - resolving Symptomatic measures only

## 2012-09-13 NOTE — Patient Instructions (Signed)
For your "Teresa Grant"-  Take 1/2 the usual dose of Metformin and continue Glipizide as usual. You must check your sugars at least twice a day. If you have any low readings, then take only the Metformin and skip the Glipizide (this medication is more likely to cause low blood sugar). I hope you will continue to maintain healthy eating even after the fast ends.

## 2012-09-16 NOTE — Progress Notes (Signed)
Quick Note:  Please notify pt that results are normal.   Provide pt with copy of labs. ______ 

## 2012-09-17 ENCOUNTER — Encounter: Payer: Self-pay | Admitting: Family Medicine

## 2012-09-19 ENCOUNTER — Ambulatory Visit: Payer: BC Managed Care – PPO | Admitting: Family Medicine

## 2012-10-04 ENCOUNTER — Encounter: Payer: BC Managed Care – PPO | Admitting: Family Medicine

## 2012-10-05 ENCOUNTER — Encounter: Payer: BC Managed Care – PPO | Admitting: Physician Assistant

## 2012-10-05 NOTE — Progress Notes (Signed)
This encounter was created in error - please disregard.

## 2012-10-31 ENCOUNTER — Encounter: Payer: Self-pay | Admitting: Family Medicine

## 2012-10-31 ENCOUNTER — Ambulatory Visit (INDEPENDENT_AMBULATORY_CARE_PROVIDER_SITE_OTHER): Payer: BC Managed Care – PPO | Admitting: Family Medicine

## 2012-10-31 VITALS — BP 122/77 | HR 86 | Temp 97.8°F | Resp 16 | Ht 62.0 in | Wt 169.0 lb

## 2012-10-31 DIAGNOSIS — Z029 Encounter for administrative examinations, unspecified: Secondary | ICD-10-CM

## 2012-10-31 DIAGNOSIS — R82998 Other abnormal findings in urine: Secondary | ICD-10-CM

## 2012-10-31 DIAGNOSIS — R8290 Unspecified abnormal findings in urine: Secondary | ICD-10-CM

## 2012-10-31 DIAGNOSIS — Z23 Encounter for immunization: Secondary | ICD-10-CM

## 2012-10-31 LAB — POCT UA - MICROSCOPIC ONLY
Casts, Ur, LPF, POC: NEGATIVE
Crystals, Ur, HPF, POC: NEGATIVE
Yeast, UA: NEGATIVE

## 2012-10-31 LAB — POCT URINALYSIS DIPSTICK
Bilirubin, UA: NEGATIVE
Glucose, UA: NEGATIVE
Ketones, UA: NEGATIVE
Nitrite, UA: NEGATIVE
Spec Grav, UA: >=1.03
Urobilinogen, UA: 0.2
pH, UA: 5

## 2012-10-31 NOTE — Patient Instructions (Addendum)

## 2012-11-02 LAB — URINE CULTURE
Colony Count: NO GROWTH
Organism ID, Bacteria: NO GROWTH

## 2012-11-03 NOTE — Progress Notes (Signed)
Quick Note:  Please notify pt that results are normal.   Provide pt with copy of labs. ______ 

## 2012-11-05 DIAGNOSIS — IMO0001 Reserved for inherently not codable concepts without codable children: Secondary | ICD-10-CM | POA: Insufficient documentation

## 2012-11-05 NOTE — Progress Notes (Signed)
Subjective:    Patient ID: Teresa Grant, female    DOB: 1961-09-19, 52 y.o.   MRN: [id number]  HPI  This 52 y.o. AA female has HTN and Type II DM (last A1c in Dec 2-13= 8.3%). She had   lumbar DDD surgery in 2013 and was out of work for months. 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 She reports no problems  w/ medications and is trying to improve activity level and nutrition.    Review of Systems  Constitutional: Positive for fatigue. Negative for diaphoresis and appetite change.  Eyes: Negative.   Respiratory: Negative for cough, chest tightness and shortness of breath.   Cardiovascular: Negative for chest pain and palpitations.  Musculoskeletal: Negative for myalgias, back pain and gait problem.  Neurological: Negative for dizziness, syncope, weakness, numbness and headaches.  All other systems reviewed and are negative.       Objective:   Physical Exam  Nursing note and vitals reviewed. Constitutional: She is oriented to person, place, and time. She appears well-developed and well-nourished. No distress.  HENT:  Head: Normocephalic and atraumatic.  Right Ear: External ear normal.  Left Ear: External ear normal.  Nose: Nose normal.  Mouth/Throat: Oropharynx is clear and moist.  Eyes: Conjunctivae and EOM are normal. Pupils are equal, round, and reactive to light. No scleral icterus.  Neck: Normal range of motion. Neck supple. No thyromegaly present.  Cardiovascular: Normal rate, regular rhythm, normal heart sounds and intact distal pulses.  Exam reveals no gallop and no friction rub.   No murmur heard. Pulmonary/Chest: Effort normal and breath sounds normal. No respiratory distress. She has no wheezes.  Abdominal: Soft. Bowel sounds are normal. She exhibits no distension and no mass. There is no hepatosplenomegaly. There is no tenderness. There is no guarding and no CVA tenderness.  Musculoskeletal: Normal range  of motion. She exhibits no edema and no tenderness.  Lymphadenopathy:    She has no cervical adenopathy.  Neurological: She is alert and oriented to person, place, and time. She has normal reflexes. No cranial nerve deficit. She exhibits normal muscle tone. Coordination normal.  Skin: Skin is warm and dry. No rash noted. No erythema.  Psychiatric: She has a normal mood and affect. Her behavior is normal. Judgment and thought content normal.    Results for orders placed in visit on 10/31/12  URINE CULTURE      Result Value Range   Colony Count NO GROWTH     Organism ID, Bacteria NO GROWTH    POCT URINALYSIS DIPSTICK      Result Value Range   Color, UA yellow     Clarity, UA clear     Glucose, UA neg     Bilirubin, UA neg     Ketones, UA neg     Spec Grav, UA >=1.030     Blood, UA small     pH, UA 5.0     Protein, UA >=300mg /dl     Urobilinogen, UA 0.2     Nitrite, UA neg     Leukocytes, UA small (1+)    POCT UA - MICROSCOPIC ONLY      Result Value Range   WBC, Ur, HPF, POC 10-32     RBC, urine, microscopic 0-4     Bacteria, U Microscopic trace     Mucus, UA trace     Epithelial cells, urine per micros 4-10     Crystals, Ur,  HPF, POC neg     Casts, Ur, LPF, POC neg     Yeast, UA neg           Assessment & Plan:   1. Encounters for administrative purpose  POCT urinalysis dipstick      POCT UA - Microscopic Only  2. Abnormal finding on urinalysis  Urine culture     3. Need for prophylactic vaccination and inoculation against influenza     Meds ordered this encounter  Medications  . metoprolol tartrate (LOPRESSOR) 25 MG tablet    Sig: Take 25 mg by mouth daily.   DMV form completed and faxed to [facility] on 11/05/2012. This form will be scanned into EMR.

## 2013-01-01 ENCOUNTER — Ambulatory Visit (INDEPENDENT_AMBULATORY_CARE_PROVIDER_SITE_OTHER): Payer: BC Managed Care – PPO | Admitting: Family Medicine

## 2013-01-01 ENCOUNTER — Encounter: Payer: Self-pay | Admitting: Family Medicine

## 2013-01-01 ENCOUNTER — Ambulatory Visit: Payer: BC Managed Care – PPO

## 2013-01-01 VITALS — BP 122/84 | HR 83 | Temp 98.6°F | Resp 16 | Ht 61.75 in | Wt 168.6 lb

## 2013-01-01 DIAGNOSIS — M79609 Pain in unspecified limb: Secondary | ICD-10-CM

## 2013-01-01 DIAGNOSIS — M79675 Pain in left toe(s): Secondary | ICD-10-CM

## 2013-01-01 DIAGNOSIS — IMO0001 Reserved for inherently not codable concepts without codable children: Secondary | ICD-10-CM

## 2013-01-01 LAB — POCT GLYCOSYLATED HEMOGLOBIN (HGB A1C): Hemoglobin A1C: 7.9

## 2013-01-01 MED ORDER — ROSUVASTATIN CALCIUM 10 MG PO TABS: 10.0000 mg | ORAL_TABLET | Freq: Every evening | ORAL | Status: DC

## 2013-01-01 MED ORDER — METFORMIN HCL 1000 MG PO TABS: 1000.0000 mg | ORAL_TABLET | Freq: Two times a day (BID) | ORAL | Status: DC

## 2013-01-01 MED ORDER — INSULIN GLARGINE 100 UNIT/ML ~~LOC~~ SOLN: 30.0000 [IU] | Freq: Every day | SUBCUTANEOUS | Status: DC

## 2013-01-01 MED ORDER — AMLODIPINE BESYLATE 10 MG PO TABS: 10.0000 mg | ORAL_TABLET | Freq: Every day | ORAL | Status: DC

## 2013-01-01 MED ORDER — GLIPIZIDE ER 10 MG PO TB24: 10.0000 mg | ORAL_TABLET | Freq: Every day | ORAL | Status: DC

## 2013-01-01 NOTE — Progress Notes (Signed)
S:  This 52 y.o. AA female has Type II DM; FSBS= 113-176. She does fairly well w/ meal planning but recalls one episode of hypoglycemia. Medication compliance is excellent w/o side effects. Weight remains stable.   Pt  Recalls injury to  L foot in Jan 2014; 4th digit has been sore and swollen since that time.  ROS: Negative for fatigue, abnormal weight change, CP or tightness, palpitations, edema, SOB or DOE, cough, GI problems, myalgias, joint pain, HA, dizziness, weakness, numbness, syncope or mental status changes. Sleep hygiene is good.   Patient Active Problem List  Diagnosis  . DIABETES MELLITUS, TYPE I- [facility] evaluation within last 6 months  . HYPERLIPIDEMIA  . HYPERTENSION  . DDD (degenerative disc disease), lumbar  . Diabetes with proteinuria    O:  Filed Vitals:   01/01/13 1035  BP: 122/84  Pulse: 83  Temp: 98.6 F (37 C)  Resp: 16   GEN: In NAD; WN,WD. HENT: [place]/AT; EOMI w/ clear conj/ sclerae. EACs/nose normal. Oroph clear and moist. COR: RRR; no edema. LUNGS: Normal resp rate and effort. SKIN: W&D.   See DM foot exam. MS: L foot- 4th digit tender and mildly swollen. Neurovasc intact. NEURO: A&O x 3; CNs intact. Nonfocal.   Results for orders placed in visit on 01/01/13  POCT GLYCOSYLATED HEMOGLOBIN (HGB A1C)      Result Value Range   Hemoglobin A1C 7.9      [facility] reading (PRIMARY) by  Dr. Audria Nine: Left 4th digit: Questionable fracture through distal phalanx.   A/P: Type II or unspecified type diabetes mellitus without mention of complication, uncontrolled - Plan: Continue current medications and improve nutrition (reduce Carbs and concentrated sugars); monitor portion sizes and keep healthy    snacks available. Increase activity for weight loss.    Pain of toe of left foot - Plan: DG Toe 4th Left; advise Podiatry evaluation   Meds ordered this encounter  Medications  . amLODipine (NORVASC) 10 MG tablet    Sig: Take 1 tablet (10 mg total) by mouth  daily.    Dispense:  90 tablet    Refill:  1  . glipiZIDE (GLUCOTROL XL) 10 MG 24 hr tablet    Sig: Take 1 tablet (10 mg total) by mouth daily.    Dispense:  90 tablet    Refill:  1  . insulin glargine (LANTUS) 100 UNIT/ML injection    Sig: Inject 0.3 mLs (30 Units total) into the skin at bedtime.    Dispense:  5 pen    Refill:  5    Please dispense the Lantus SoloStar pens (5 pens per box)  . metFORMIN (GLUCOPHAGE) 1000 MG tablet    Sig: Take 1 tablet (1,000 mg total) by mouth 2 (two) times daily with a meal.    Dispense:  180 tablet    Refill:  1  . rosuvastatin (CRESTOR) 10 MG tablet    Sig: Take 1 tablet (10 mg total) by mouth every evening.    Dispense:  90 tablet    Refill:  1

## 2013-01-02 ENCOUNTER — Other Ambulatory Visit: Payer: Self-pay | Admitting: Family Medicine

## 2013-01-02 DIAGNOSIS — M79675 Pain in left toe(s): Secondary | ICD-10-CM

## 2013-01-02 NOTE — Progress Notes (Signed)
Quick Note:  Your L foot-toe xray is abnormal; there is the suggestive of a fracture as we discussed. I am placing an order for Podiatry evaluation. ______

## 2013-03-25 DIAGNOSIS — N200 Calculus of kidney: Secondary | ICD-10-CM

## 2013-03-25 HISTORY — DX: Calculus of kidney: N20.0

## 2013-03-28 ENCOUNTER — Telehealth: Payer: Self-pay

## 2013-03-28 NOTE — Telephone Encounter (Signed)
Patient is completely out of all her medications: glipizide, crestor, metformin, and almodpine. [facility] uses [facility] [address].  Patient number: [phone].

## 2013-03-31 MED ORDER — GLIPIZIDE ER 10 MG PO TB24: 10.0000 mg | ORAL_TABLET | Freq: Every day | ORAL | Status: DC

## 2013-03-31 MED ORDER — INSULIN GLARGINE 100 UNIT/ML ~~LOC~~ SOLN: 30.0000 [IU] | Freq: Every day | SUBCUTANEOUS | Status: DC

## 2013-03-31 MED ORDER — METFORMIN HCL 1000 MG PO TABS: 1000.0000 mg | ORAL_TABLET | Freq: Two times a day (BID) | ORAL | Status: DC

## 2013-03-31 MED ORDER — ROSUVASTATIN CALCIUM 10 MG PO TABS: 10.0000 mg | ORAL_TABLET | Freq: Every evening | ORAL | Status: DC

## 2013-03-31 MED ORDER — AMLODIPINE BESYLATE 10 MG PO TABS: 10.0000 mg | ORAL_TABLET | Freq: Every day | ORAL | Status: DC

## 2013-03-31 NOTE — Telephone Encounter (Signed)
She had refills/ [facility] does not have on file, resent

## 2013-04-02 ENCOUNTER — Ambulatory Visit: Payer: BC Managed Care – PPO | Admitting: Family Medicine

## 2013-04-03 ENCOUNTER — Other Ambulatory Visit: Payer: Self-pay

## 2013-04-25 ENCOUNTER — Encounter: Payer: Self-pay | Admitting: Family Medicine

## 2013-04-25 ENCOUNTER — Ambulatory Visit (INDEPENDENT_AMBULATORY_CARE_PROVIDER_SITE_OTHER): Payer: BC Managed Care – PPO | Admitting: Family Medicine

## 2013-04-25 VITALS — BP 118/72 | HR 76 | Temp 98.0°F | Resp 16 | Ht 61.5 in | Wt 166.6 lb

## 2013-04-25 DIAGNOSIS — R809 Proteinuria, unspecified: Secondary | ICD-10-CM

## 2013-04-25 DIAGNOSIS — R079 Chest pain, unspecified: Secondary | ICD-10-CM

## 2013-04-25 DIAGNOSIS — R0789 Other chest pain: Secondary | ICD-10-CM

## 2013-04-25 DIAGNOSIS — E1129 Type 2 diabetes mellitus with other diabetic kidney complication: Secondary | ICD-10-CM

## 2013-04-25 DIAGNOSIS — IMO0001 Reserved for inherently not codable concepts without codable children: Secondary | ICD-10-CM

## 2013-04-25 LAB — COMPREHENSIVE METABOLIC PANEL
ALT: 19 U/L (ref 0–35)
AST: 19 U/L (ref 0–37)
Albumin: 4.4 g/dL (ref 3.5–5.2)
Alkaline Phosphatase: 119 U/L — ABNORMAL HIGH (ref 39–117)
BUN: 8 mg/dL (ref 6–23)
CO2: 28 mEq/L (ref 19–32)
Calcium: 9.5 mg/dL (ref 8.4–10.5)
Chloride: 101 mEq/L (ref 96–112)
Creat: 0.8 mg/dL (ref 0.50–1.10)
Glucose, Bld: 192 mg/dL — ABNORMAL HIGH (ref 70–99)
Potassium: 3.6 mEq/L (ref 3.5–5.3)
Sodium: 138 mEq/L (ref 135–145)
Total Bilirubin: 0.7 mg/dL (ref 0.3–1.2)
Total Protein: 7.5 g/dL (ref 6.0–8.3)

## 2013-04-25 LAB — POCT URINALYSIS DIPSTICK
Bilirubin, UA: NEGATIVE
Glucose, UA: NEGATIVE
Ketones, UA: NEGATIVE
Leukocytes, UA: NEGATIVE
Nitrite, UA: NEGATIVE
Spec Grav, UA: >=1.03
Urobilinogen, UA: 0.2
pH, UA: 5.5

## 2013-04-25 LAB — TSH: TSH: 1.833 u[IU]/mL (ref 0.350–4.500)

## 2013-04-25 LAB — POCT GLYCOSYLATED HEMOGLOBIN (HGB A1C): Hemoglobin A1C: 8.9

## 2013-04-25 LAB — T3, FREE: T3, Free: 3.3 pg/mL (ref 2.3–4.2)

## 2013-04-25 MED ORDER — GLIPIZIDE ER 10 MG PO TB24: 10.0000 mg | ORAL_TABLET | Freq: Every day | ORAL | Status: DC

## 2013-04-25 MED ORDER — INSULIN GLARGINE 100 UNIT/ML ~~LOC~~ SOLN: 30.0000 [IU] | Freq: Every day | SUBCUTANEOUS | Status: DC

## 2013-04-25 MED ORDER — ROSUVASTATIN CALCIUM 10 MG PO TABS: 10.0000 mg | ORAL_TABLET | Freq: Every evening | ORAL | Status: DC

## 2013-04-25 MED ORDER — AMLODIPINE BESYLATE 10 MG PO TABS: 10.0000 mg | ORAL_TABLET | Freq: Every day | ORAL | Status: DC

## 2013-04-25 MED ORDER — METFORMIN HCL 1000 MG PO TABS: 1000.0000 mg | ORAL_TABLET | Freq: Two times a day (BID) | ORAL | Status: DC

## 2013-04-25 NOTE — Progress Notes (Signed)
Subjective:    Patient ID: Teresa Grant, female    DOB: Sep 04, 1961, 52 y.o.   MRN: [id number]  HPI  This 52 y.o. AA female has Type II DM and HTN; she probably has Metabolic Syndrome. She  is compliant w/ medications but other TLCs (nutrition and exercise) need improvement. Pt denies  hypoglycemic symptoms. FSBS not done.   Pt reports neck discomfort radiating to R shoulder and upper arm, onset 3 days ago while doing  chores around the house. This discomfort progressed to tightness in anterior chest w/o radiation  into L arm or anterior neck or mid-back. Pt denies SOB, n/v, dizziness or syncope. She reports  episodes on diaphoresis. Pt tried taking TUMS but felt no improvement. Since that episode, she  has felt fatigued.   Patient Active Problem List   Diagnosis Date Noted  . Diabetes with proteinuria 11/05/2012  . DDD (degenerative disc disease), lumbar 06/23/2012  . DIABETES MELLITUS, TYPE II 11/27/2006  . HYPERLIPIDEMIA 11/27/2006  . HYPERTENSION 11/27/2006    Review of Systems  Constitutional: Positive for diaphoresis, activity change and fatigue. Negative for fever.  Respiratory: Positive for chest tightness. Negative for cough and shortness of breath.   Cardiovascular: Negative for chest pain, palpitations and leg swelling.  Gastrointestinal: Negative.   Musculoskeletal: Positive for arthralgias.  Neurological: Negative.   Psychiatric/Behavioral: Negative.    PMHx, Soc Hx and Fam Hx reviewed. Medications reconciled.     Objective:   Physical Exam  Nursing note and vitals reviewed. Constitutional: She is oriented to person, place, and time. She appears well-developed and well-nourished. No distress.  HENT:  Head: Normocephalic and atraumatic.  Right Ear: External ear normal.  Left Ear: External ear normal.  Nose: Nose normal.  Mouth/Throat: Oropharynx is clear and moist.  Eyes: Conjunctivae and EOM are normal. Pupils are equal, round, and reactive to light. No  scleral icterus.  Neck: Normal range of motion. No thyromegaly present.  Cardiovascular: Normal rate, regular rhythm, S1 normal, S2 normal and normal heart sounds.  PMI is not displaced.  Exam reveals no gallop and no friction rub.   No murmur heard. Pulmonary/Chest: Effort normal and breath sounds normal. No respiratory distress.  Abdominal: Soft. Bowel sounds are normal. She exhibits no distension, no abdominal bruit, no pulsatile midline mass and no mass. There is no hepatosplenomegaly. There is no tenderness. There is no guarding and no CVA tenderness. No hernia.  Musculoskeletal: Normal range of motion. She exhibits no edema and no tenderness.  Lymphadenopathy:    She has no cervical adenopathy.  Neurological: She is alert and oriented to person, place, and time. No cranial nerve deficit. She exhibits normal muscle tone. Coordination normal.  Skin: Skin is warm and dry. No erythema.  Psychiatric: She has a normal mood and affect. Her behavior is normal. Judgment and thought content normal.   A1c=8.9%  ECG: NSR; no ST-TW changes or ectopy.      Assessment & Plan:  Type II or unspecified type diabetes mellitus without mention of complication, uncontrolled - Plan: POCT glycosylated hemoglobin (Hb A1C), TSH, T3, Free, EKG 12-Lead  Persistent proteinuria associated with type 2 diabetes mellitus - Consider imaging of kidneys and upper tract.   Plan: Comprehensive metabolic panel, POCT urinalysis dipstick, Protein, urine, 24 hour, Creatinine, Urine, 24 Hour, Creatinine Clearance, Urine, 24 Hour  Atypical chest pain - Advised pt that she is at higher risk for cardiovascular disease because of Metabolic Syndrome.    Plan: Ambulatory referral to Cardiology

## 2013-04-25 NOTE — Patient Instructions (Addendum)
I am referring you to a Heart Specialist (CARDIOLOGY) for further evaluation of the symptoms you have had. Because you have Diabetes, hypertension and lipid disorder, you are a high risk for heart disease.  Your Diabetes is not well controlled. I can refer you for nutrition counseling if you think that will help. You declined that offer today.  You have persistent protein in your urine; you will need to do a 24 hour urine collection. Your have been instructed about what you need to do to complete this test.

## 2013-04-26 ENCOUNTER — Encounter: Payer: Self-pay | Admitting: Family Medicine

## 2013-04-26 NOTE — Progress Notes (Signed)
Quick Note:  Please contact pt and advise that the following labs are abnormal...  Blood sugar is above normal as you are aware (uncontrolled Diabetes). Other labs are normal. Kidney and liver function are normal. Thyroid values are normal.  Copy to pt. ______

## 2013-04-28 ENCOUNTER — Other Ambulatory Visit: Payer: Self-pay | Admitting: *Deleted

## 2013-04-28 DIAGNOSIS — R809 Proteinuria, unspecified: Secondary | ICD-10-CM

## 2013-04-28 DIAGNOSIS — E1129 Type 2 diabetes mellitus with other diabetic kidney complication: Secondary | ICD-10-CM

## 2013-04-28 NOTE — Progress Notes (Signed)
Patient was set up with future orders for Lab  Collect. ([facility]) received a phone call from [facility] that patient had come to pick up her items for the 24 hr urine. They would not give it to her and said i would need to release the orders but i was under the impression that they could do that. To save an argument i released them for [facility] and told them to make sure the patient was called back to get her tests completed.

## 2013-04-29 ENCOUNTER — Encounter: Payer: Self-pay | Admitting: *Deleted

## 2013-04-30 ENCOUNTER — Telehealth: Payer: Self-pay | Admitting: *Deleted

## 2013-04-30 NOTE — Telephone Encounter (Signed)
I received a phone call from [facility] labs about some future lab collect orders for this patient. [facility] said the patient had come by to do the orders and they could not release them, even though the patient had a copy of the orders in her hand. [facility] sent her away and then called me to tell me about it, when they should have called while the patient was still there. I released the lab collect orders and called the patient and left a message that she could now go back to [facility] and pick up her supplies for the 24 hr urine and if she wanted to come  by here and pick up another copy of the orders she could. The copies of the orders are at my desk at 104. They are in Epic and [facility] can see them and proceed with the orders. Let the patient know if there were any concerns or questions to call.

## 2013-05-05 ENCOUNTER — Ambulatory Visit
Admission: RE | Admit: 2013-05-05 | Discharge: 2013-05-05 | Disposition: A | Discharge status: Home / Self Care | Payer: BC Managed Care – PPO | Source: Ambulatory Visit | Attending: Family Medicine | Admitting: Family Medicine

## 2013-05-05 DIAGNOSIS — E1129 Type 2 diabetes mellitus with other diabetic kidney complication: Secondary | ICD-10-CM

## 2013-05-06 ENCOUNTER — Telehealth: Payer: Self-pay | Admitting: Family Medicine

## 2013-05-06 NOTE — Telephone Encounter (Signed)
Renal US shows 12 mm non-obstructing stone in lower pole of right kidney. This has been present for at least 3 years. Pt has trace blood in urine; I have advised that I may want her to see Urologist for further evaluation. She is currently completing a 24-hr urine collection (which should be finished 05/07/13). She will be mailed a copy of the renal US as well as our discussion of the results.

## 2013-05-07 ENCOUNTER — Other Ambulatory Visit: Payer: Self-pay | Admitting: Family Medicine

## 2013-05-07 ENCOUNTER — Other Ambulatory Visit: Payer: Self-pay | Admitting: Radiology

## 2013-05-07 DIAGNOSIS — N200 Calculus of kidney: Secondary | ICD-10-CM

## 2013-05-07 LAB — COMPREHENSIVE METABOLIC PANEL
ALT: 18 U/L (ref 0–35)
AST: 21 U/L (ref 0–37)
Albumin: 4.5 g/dL (ref 3.5–5.2)
Alkaline Phosphatase: 115 U/L (ref 39–117)
BUN: 11 mg/dL (ref 6–23)
CO2: 29 mEq/L (ref 19–32)
Calcium: 10 mg/dL (ref 8.4–10.5)
Chloride: 105 mEq/L (ref 96–112)
Creat: 0.78 mg/dL (ref 0.50–1.10)
Glucose, Bld: 197 mg/dL — ABNORMAL HIGH (ref 70–99)
Potassium: 4.2 mEq/L (ref 3.5–5.3)
Sodium: 141 mEq/L (ref 135–145)
Total Bilirubin: 0.5 mg/dL (ref 0.3–1.2)
Total Protein: 7.1 g/dL (ref 6.0–8.3)

## 2013-05-08 LAB — CREATININE, URINE, 24 HOUR
Creatinine, 24H Ur: 1444 mg/d (ref 700–1800)
Creatinine, Urine: 80.2 mg/dL

## 2013-05-08 LAB — PROTEIN, URINE, 24 HOUR
Protein, 24H Urine: 702 mg/d — ABNORMAL HIGH (ref 50–100)
Protein, Urine: 39 mg/dL

## 2013-05-08 LAB — CREATININE CLEARANCE, URINE, 24 HOUR
Creatinine Clearance: 129 mL/min — ABNORMAL HIGH (ref 75–115)
Creatinine, 24H Ur: 1444 mg/d (ref 700–1800)
Creatinine, Urine: 80.2 mg/dL
Creatinine: 0.78 mg/dL (ref 0.50–1.10)

## 2013-05-08 LAB — T3, FREE: T3, Free: 3.2 pg/mL (ref 2.3–4.2)

## 2013-05-08 LAB — TSH: TSH: 1.446 u[IU]/mL (ref 0.350–4.500)

## 2013-05-22 ENCOUNTER — Other Ambulatory Visit: Payer: Self-pay | Admitting: Family Medicine

## 2013-05-22 DIAGNOSIS — E1129 Type 2 diabetes mellitus with other diabetic kidney complication: Secondary | ICD-10-CM

## 2013-05-22 NOTE — Progress Notes (Signed)
Quick Note:  Please contact pt and advise that the following labs are abnormal... All the results of your urine collection (to look at your Kidney function) look good except you have a lot of protein in your urine. Thyroid function is normal.  I am referring you to a Kidney Specialist (Nephrologist) for further evaluation.  Copy to pt. ______

## 2013-05-23 ENCOUNTER — Encounter: Payer: Self-pay | Admitting: Radiology

## 2013-06-09 ENCOUNTER — Other Ambulatory Visit: Payer: Self-pay | Admitting: Urology

## 2013-06-10 ENCOUNTER — Encounter (HOSPITAL_COMMUNITY): Payer: Self-pay | Admitting: Pharmacy Technician

## 2013-06-10 ENCOUNTER — Encounter (HOSPITAL_COMMUNITY): Payer: Self-pay

## 2013-06-10 NOTE — Progress Notes (Signed)
Patient does not have an open draining wound and never has. 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 Spoke w Okey Regal in infection prevention. Per protocol , she is to be on contact isolation, have a PCR swab pre-litho, use bactroban x 1 dose. When results are available, if patient is negative she may come off of isolation. Okey Regal or Vernona Rieger of IP will follow patient on Thurs. If she is negative they will remove her yellow flag in EPIC. If positive , she will follow protocol.  Call to Lossie Faes  At [facility] to Make Dr Vernie Ammons aware.  As well as the fact that he needs to sign his orders.

## 2013-06-12 ENCOUNTER — Encounter (HOSPITAL_COMMUNITY)
Admission: RE | Disposition: A | Discharge status: Home / Self Care | Payer: Self-pay | Source: Ambulatory Visit | Attending: Urology

## 2013-06-12 ENCOUNTER — Encounter (HOSPITAL_COMMUNITY): Payer: Self-pay | Admitting: *Deleted

## 2013-06-12 ENCOUNTER — Ambulatory Visit (HOSPITAL_COMMUNITY)
Admission: RE | Admit: 2013-06-12 | Discharge: 2013-06-12 | Disposition: A | Discharge status: Home / Self Care | Payer: BC Managed Care – PPO | Source: Ambulatory Visit | Attending: Urology | Admitting: Urology

## 2013-06-12 ENCOUNTER — Ambulatory Visit (HOSPITAL_COMMUNITY): Payer: BC Managed Care – PPO

## 2013-06-12 DIAGNOSIS — Z90710 Acquired absence of both cervix and uterus: Secondary | ICD-10-CM | POA: Insufficient documentation

## 2013-06-12 DIAGNOSIS — Z79899 Other long term (current) drug therapy: Secondary | ICD-10-CM | POA: Insufficient documentation

## 2013-06-12 DIAGNOSIS — E119 Type 2 diabetes mellitus without complications: Secondary | ICD-10-CM | POA: Insufficient documentation

## 2013-06-12 DIAGNOSIS — N200 Calculus of kidney: Secondary | ICD-10-CM | POA: Diagnosis present

## 2013-06-12 DIAGNOSIS — I10 Essential (primary) hypertension: Secondary | ICD-10-CM | POA: Insufficient documentation

## 2013-06-12 DIAGNOSIS — E7800 Pure hypercholesterolemia, unspecified: Secondary | ICD-10-CM | POA: Insufficient documentation

## 2013-06-12 HISTORY — DX: Calculus of kidney: N20.0

## 2013-06-12 HISTORY — DX: Cardiac arrhythmia, unspecified: I49.9

## 2013-06-12 LAB — GLUCOSE, CAPILLARY: Glucose-Capillary: 135 mg/dL — ABNORMAL HIGH (ref 70–99)

## 2013-06-12 SURGERY — LITHOTRIPSY, ESWL
Anesthesia: LOCAL | Laterality: Right

## 2013-06-12 MED ORDER — OXYCODONE-ACETAMINOPHEN 5-325 MG PO TABS: 1.0000 | ORAL_TABLET | ORAL | Status: DC | PRN

## 2013-06-12 MED ORDER — DIAZEPAM 5 MG PO TABS
10.0000 mg | ORAL_TABLET | ORAL | Status: AC
  Administered 2013-06-12: 10 mg via ORAL
  Filled 2013-06-12: qty 2

## 2013-06-12 MED ORDER — OXYCODONE HCL 5 MG PO TABS: 5.0000 mg | ORAL_TABLET | ORAL | Status: DC | PRN

## 2013-06-12 MED ORDER — CIPROFLOXACIN HCL 500 MG PO TABS
500.0000 mg | ORAL_TABLET | ORAL | Status: AC
  Administered 2013-06-12: 500 mg via ORAL
  Filled 2013-06-12: qty 1

## 2013-06-12 MED ORDER — TAMSULOSIN HCL 0.4 MG PO CAPS: 0.4000 mg | ORAL_CAPSULE | ORAL | Status: DC

## 2013-06-12 MED ORDER — OXYCODONE-ACETAMINOPHEN 10-325 MG PO TABS: 1.0000 | ORAL_TABLET | ORAL | Status: DC | PRN

## 2013-06-12 MED ORDER — SODIUM CHLORIDE 0.9 % IV SOLN
INTRAVENOUS | Status: DC
  Administered 2013-06-12: 15:00:00 via INTRAVENOUS

## 2013-06-12 MED ORDER — DIPHENHYDRAMINE HCL 25 MG PO CAPS
25.0000 mg | ORAL_CAPSULE | ORAL | Status: AC
  Administered 2013-06-12: 25 mg via ORAL
  Filled 2013-06-12: qty 1

## 2013-06-12 MED ORDER — PROMETHAZINE HCL 25 MG/ML IJ SOLN
6.2500 mg | INTRAMUSCULAR | Status: DC | PRN
  Administered 2013-06-12: 6.25 mg via INTRAVENOUS
  Filled 2013-06-12: qty 1

## 2013-06-12 NOTE — H&P (Signed)
Patient was referred by Dr. Dow Adolph, M.D. for evaluation of kidney stones and microscopic hematuria.  Patient underwent a renal ultrasound on 05/05/13 which was performed for left leg pain and diabetic proteinuria.  Patient was found to have a 12 mm nonobstructing right lower pole renal calculus with no evidence of hydronephrosis.  Patient denies any right flank pain or voiding symptoms.  She has had no fevers, chills, or nausea or vomiting.  She has no history of kidney stones. She has never been treated for stone the past.   She has no family history of kidney stones either.    05/07/13: BUN 11, creatinine 0.78   Past Medical History Problems  1. History of  Diabetes Mellitus 250.00 2. History of  Hypercholesterolemia 272.0 3. History of  Hypertension 401.9 4. History of  Reported A Previous Heart Murmur  Surgical History Problems  1. History of  Hysterectomy V45.77 2. History of  Laminectomy Lumbar  Current Meds 1. AmLODIPine Besylate 10 MG Oral Tablet; Therapy: 01Aug2014 to 2. Crestor 10 MG Oral Tablet; Therapy: 01Aug2014 to 3. GlipiZIDE ER 10 MG Oral Tablet Extended Release 24 Hour; Therapy: 01Aug2014 to 4. Lantus SoloStar 100 UNIT/ML Subcutaneous Solution Pen-injector; Therapy: 01Aug2014 to 5. MetFORMIN HCl 1000 MG Oral Tablet; Therapy: 09Apr2014 to  Allergies Medication  1. No Known Drug Allergies  Family History Problems  1. Family history of  Death In The Family Father age 36 2. Family history of  Death In The Family Mother age 33 3. 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     Vitals Vital Signs  BMI Calculated: 27.82 BSA Calculated: 1.83 Height: 5 ft 5 in Weight: 167 lb  Blood Pressure: 113 / 79 Temperature: 98.6 F Heart Rate: 77  Physical Exam Constitutional: Well nourished and well developed . No acute distress.  ENT:. The ears and nose are normal in appearance.  Neck: The appearance of the neck is normal and no neck mass is present.  Pulmonary: No respiratory distress and normal respiratory rhythm and effort.  Neuro/Psych:. Mood and affect are appropriate.    Results/Data Urine  COLOR YELLOW   APPEARANCE CLEAR   SPECIFIC GRAVITY 1.020   pH 6.0   GLUCOSE NEG mg/dL  BILIRUBIN NEG   KETONE NEG mg/dL  BLOOD SMALL   PROTEIN 100 mg/dL  UROBILINOGEN 0.2 mg/dL  NITRITE NEG   LEUKOCYTE ESTERASE NEG   SQUAMOUS EPITHELIAL/HPF RARE   WBC 3-6 WBC/hpf  RBC 0-2 RBC/hpf  BACTERIA RARE   CRYSTALS NONE SEEN   CASTS NONE SEEN   Other MUCUS NOTED     A KUB was obtained in the office today: I have independently reviewed the x-rays.  The patient has a 1 cm x 7 mm opacification in the right renal shadow likely in the lower pole as seen on her ultrasound as well as a CT scan from 3 years prior.   Assessment  Right nonobstructing can by 7 mm stone in the lower pole.  The stone appears to have grown over the  last several years for approximately 5 mm to 10mm.  in addition the patient has proteinuria.   Plan    We went over the treatment options for her stone in great detail.  Particularly,we discussed conservative management with a repeat ultrasound in 6 months, shockwave lithotripsy, ureteroscopy, and PCNL.  I have outlined the risks and benefits of all treatment options.  I do not think I can get this stone out with ureteroscopy in 1 session given its size.  I recommended the patient have shockwave lithotripsy because the stone appears to be fairly soft by Hounsfield units and should fracture easily.  She is aware  of the risk including perinephric hematoma, Steinstrasse, pain and damage to surrounding areas.  Further she is aware that she may need additional procedures.  I also reviewed her medications and she appears to have no contraindications for shockwave.

## 2013-06-12 NOTE — Op Note (Signed)
See [facility] OP note scanned into chart. 

## 2013-06-12 NOTE — Progress Notes (Addendum)
Patient returns from mobile lithotripsy unit nauseated. Vomited about 50 cc thick clear emesis. Paged Denna Haggard NP for antiemetic. 1805: Orders received.

## 2013-06-13 ENCOUNTER — Encounter: Payer: Self-pay | Admitting: *Deleted

## 2013-06-19 ENCOUNTER — Ambulatory Visit: Payer: BC Managed Care – PPO | Admitting: Cardiovascular Disease

## 2013-07-16 ENCOUNTER — Telehealth: Payer: Self-pay

## 2013-07-16 NOTE — Telephone Encounter (Signed)
Yes. Called her to advise.

## 2013-07-16 NOTE — Telephone Encounter (Signed)
Patient is wanting to if she needs to do lab work for her appointment on Friday!  Best#: [phone]

## 2013-07-18 ENCOUNTER — Encounter: Payer: Self-pay | Admitting: Family Medicine

## 2013-07-18 ENCOUNTER — Ambulatory Visit (INDEPENDENT_AMBULATORY_CARE_PROVIDER_SITE_OTHER): Payer: BC Managed Care – PPO | Admitting: Family Medicine

## 2013-07-18 VITALS — BP 132/82 | HR 84 | Temp 98.1°F | Resp 16 | Ht 61.5 in | Wt 169.0 lb

## 2013-07-18 DIAGNOSIS — IMO0001 Reserved for inherently not codable concepts without codable children: Secondary | ICD-10-CM

## 2013-07-18 DIAGNOSIS — Z23 Encounter for immunization: Secondary | ICD-10-CM

## 2013-07-18 DIAGNOSIS — R809 Proteinuria, unspecified: Secondary | ICD-10-CM

## 2013-07-18 DIAGNOSIS — E1129 Type 2 diabetes mellitus with other diabetic kidney complication: Secondary | ICD-10-CM

## 2013-07-18 NOTE — Patient Instructions (Addendum)
Our office has called [facility] and left a message, trying to reschedule your consultation. They will call us back and then we will get in touch with you or they may contact you directly.  Continue all current medications, healthy meal planning and try to stay active. I will see you again in ~ 7 weeks for DM follow-up.

## 2013-07-18 NOTE — Progress Notes (Signed)
S:  This 52 y.o. AA female has Type II DM, on Insulin and Metformin. Pt has been more compliant w/ nutrition. FSBS- 100-150. She had 1 hypoglycemic episode when she had gone to long without eating. She recently had ESWL for removal of R kidney stone. Per pt, blood has cleared from urine at last check. Pt states urine is frothy and "like egg whites". She was unable to keep appt w/ [facility] because of ESWL procedure in September. Pt has been unable to reschedule though she has called the office and left messages.  Patient Active Problem List   Diagnosis Date Noted  . Renal calculus, right 06/12/2013  . Diabetes with proteinuria 11/05/2012  . DDD (degenerative disc disease), lumbar 06/23/2012  . DIABETES MELLITUS, TYPE II 11/27/2006  . HYPERLIPIDEMIA 11/27/2006  . HYPERTENSION 11/27/2006   PMHx, Soc Hx and Fam Hx reviewed.  ROS: As per HPI.  O: Filed Vitals:   07/18/13 1009  BP: 132/82  Pulse: 84  Temp: 98.1 F (36.7 C)  Resp: 16   GEN: In NAD; WN,WD. HENT: [place]/AT; EOMI w/ clear conj/sclerae. Otherwise unremarkable. COR: RRR. LUNGS: Normal resp rate and effort. SKIN: W&D. NEURO: A&O x 3; CNs intact. Nonfocal.  A/P: Diabetes with proteinuria- Contacted [facility] to facilitate "New pt referral".  Need for prophylactic vaccination and inoculation against influenza - Plan: Flu Vaccine QUAD 36+ mos IM

## 2013-07-23 ENCOUNTER — Telehealth: Payer: Self-pay | Admitting: Family Medicine

## 2013-07-23 NOTE — Telephone Encounter (Signed)
Called [facility] to schedule appointment for patient.  They will call her with an appointment time.  Will be late November or December before appointment is available.

## 2013-07-30 ENCOUNTER — Telehealth: Payer: Self-pay | Admitting: *Deleted

## 2013-07-30 NOTE — Telephone Encounter (Signed)
Received a telephone call from Tora Kindred from [facility] about patient. I had recently contacted kidney center about a appt for patient per Dr. Audria Nine. The Dr.  wanted her to be seen sometime this week and the patient had requested certain times. Bjorn Loser was calling to let me know the office has just went to electronic medical record and it would be impossible to get her appt this week in those time framed requested by pt. Also rhonda had spoke to pt and pt didn't like the dr she was going to be given an appt. with and Bjorn Loser voiced some concerns about the patient. I told her that Dr. Audria Nine was on vacation this week and that I would speak to her about this when she returns and would get back with her. She requested recent labs for patient and I faxed over her most recent labs to her.

## 2013-08-01 ENCOUNTER — Ambulatory Visit: Payer: BC Managed Care – PPO | Admitting: Family Medicine

## 2013-08-27 ENCOUNTER — Other Ambulatory Visit: Payer: Self-pay | Admitting: Nephrology

## 2013-08-27 DIAGNOSIS — E1129 Type 2 diabetes mellitus with other diabetic kidney complication: Secondary | ICD-10-CM

## 2013-09-04 ENCOUNTER — Ambulatory Visit
Admission: RE | Admit: 2013-09-04 | Discharge: 2013-09-04 | Disposition: A | Discharge status: Home / Self Care | Payer: BC Managed Care – PPO | Source: Ambulatory Visit | Attending: Nephrology | Admitting: Nephrology

## 2013-09-04 DIAGNOSIS — E1129 Type 2 diabetes mellitus with other diabetic kidney complication: Secondary | ICD-10-CM

## 2013-09-05 ENCOUNTER — Ambulatory Visit (INDEPENDENT_AMBULATORY_CARE_PROVIDER_SITE_OTHER): Payer: BC Managed Care – PPO | Admitting: Family Medicine

## 2013-09-05 VITALS — BP 124/85 | HR 85 | Temp 98.3°F | Resp 16 | Ht 63.0 in | Wt 167.8 lb

## 2013-09-05 DIAGNOSIS — IMO0001 Reserved for inherently not codable concepts without codable children: Secondary | ICD-10-CM

## 2013-09-05 DIAGNOSIS — G8929 Other chronic pain: Secondary | ICD-10-CM

## 2013-09-05 DIAGNOSIS — M5380 Other specified dorsopathies, site unspecified: Secondary | ICD-10-CM

## 2013-09-05 DIAGNOSIS — M62830 Muscle spasm of back: Secondary | ICD-10-CM

## 2013-09-05 DIAGNOSIS — M549 Dorsalgia, unspecified: Secondary | ICD-10-CM

## 2013-09-05 LAB — POCT GLYCOSYLATED HEMOGLOBIN (HGB A1C): Hemoglobin A1C: 7.9

## 2013-09-05 NOTE — Patient Instructions (Signed)
Diabetes and Foot Care Diabetes may cause you to have problems because of poor blood supply (circulation) to your feet and legs. This may cause the skin on your feet to become thinner, break easier, and heal more slowly. Your skin may become dry, and the skin may peel and crack. You may also have nerve damage in your legs and feet causing decreased feeling in them. You may not notice minor injuries to your feet that could lead to infections or more serious problems. Taking care of your feet is one of the most important things you can do for yourself.  HOME CARE INSTRUCTIONS  Wear shoes at all times, even in the house. Do not go barefoot. Bare feet are easily injured.  Check your feet daily for blisters, cuts, and redness. If you cannot see the bottom of your feet, use a mirror or ask someone for help.  Wash your feet with warm water (do not use hot water) and mild soap. Then pat your feet and the areas between your toes until they are completely dry. Do not soak your feet as this can dry your skin.  Apply a moisturizing lotion or petroleum jelly (that does not contain alcohol and is unscented) to the skin on your feet and to dry, brittle toenails. Do not apply lotion between your toes.  Trim your toenails straight across. Do not dig under them or around the cuticle. File the edges of your nails with an emery board or nail file.  Do not cut corns or calluses or try to remove them with medicine.  Wear clean socks or stockings every day. Make sure they are not too tight. Do not wear knee-high stockings since they may decrease blood flow to your legs.  Wear shoes that fit properly and have enough cushioning. To break in new shoes, wear them for just a few hours a day. This prevents you from injuring your feet. Always look in your shoes before you put them on to be sure there are no objects inside.  Do not cross your legs. This may decrease the blood flow to your feet.  If you find a minor scrape,  cut, or break in the skin on your feet, keep it and the skin around it clean and dry. These areas may be cleansed with mild soap and water. Do not cleanse the area with peroxide, alcohol, or iodine.  When you remove an adhesive bandage, be sure not to damage the skin around it.  If you have a wound, look at it several times a day to make sure it is healing.  Do not use heating pads or hot water bottles. They may burn your skin. If you have lost feeling in your feet or legs, you may not know it is happening until it is too late.  Make sure your health care provider performs a complete foot exam at least annually or more often if you have foot problems. Report any cuts, sores, or bruises to your health care provider immediately. SEEK MEDICAL CARE IF:   You have an injury that is not healing.  You have cuts or breaks in the skin.  You have an ingrown nail.  You notice redness on your legs or feet.  You feel burning or tingling in your legs or feet.  You have pain or cramps in your legs and feet.  Your legs or feet are numb.  Your feet always feel cold. SEEK IMMEDIATE MEDICAL CARE IF:   There is increasing redness,   swelling, or pain in or around a wound.  There is a red line that goes up your leg.  Pus is coming from a wound.  You develop a fever or as directed by your health care provider.  You notice a bad smell coming from an ulcer or wound. Document Released: 09/08/2000 Document Revised: 05/14/2013 Document Reviewed: 02/18/2013 [employer] Patient Information 2014 [employer].  

## 2013-09-05 NOTE — Progress Notes (Signed)
S;  This 52 y.o. AA female has Type II DM which has not been well controlled. FSBS= 140- 160 w/ one hypoglycemic episode within last week. She reports better compliance w/ medications and nutrition. Pt checks feet daily; she injured lateral toe on L foot months ago; c/o mild swelling and tenderness in 4th L toe. Able to wear shoes and ambulate w/o difficulty.  Kidney stone- s/p ESWL per Dr. Vernie Ammons on 06/12/13. Pt denies hematuria or flank pain. She cannot recall any specific dietary instructions but is trying to maintain good hydration.  Vision eval needed- c/o L eye twitching. No recent vision evaluation. Denies visual disturbances, floaters or HAs. 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 Denies saddle paresthesias, change in bowel or bladder habits, weakness or tingling in lower extremities. Pt is s/p lumbar surgery; she is planning her retirement from this job to return to school to study medical coding.  O: Filed Vitals:   09/05/13 1043  BP: 124/85  Pulse: 85  Temp: 98.3 F (36.8 C)  Resp: 16   GEN: In NAD; WN,WD. HENT: [place]/AT; EOMI w/ clear conj/sclerae. EACs/TMs/nasal mucosa/oroph unremarkable. NECK: Supple w/o LAN or TMG. COR: RRR. No edema.  LUNGS: CTA; nowheezes or rhonchi. Normal resp rate and effort. BACK: No CVAT; mild paravertebral muscle spasms. SKIN: W&D; intact w/o diaphoresis, erythema or lesions. Feet examined- no ulcerations or lesions, distal pulses intact. MS: L foot- 4th digit normal appearance except for slight discoloration; minimal swelling. N/V intact. NEURO: A&O x 3; CNs intact. Gait normal. Nonfocal.   Results for orders placed in visit on 09/05/13  POCT GLYCOSYLATED HEMOGLOBIN (HGB A1C)      Result Value Range   Hemoglobin A1C 7.9     A/P: Type II or unspecified type diabetes mellitus without mention of complication, uncontrolled - Much improved  glycemic control on current medications. Continue Glipizide XL 10 mg daily, Metformin 1000 mg 1 tablet bid and Insulin glargine 30 units SQ at bedtime. Plan: Ambulatory referral to Ophthalmology  Back pain, chronic- Associated w/ prior lumbar disc surgery.   Back muscle spasm- Advised lumbar support in driver's seat on the bus but pt states that is not allowed. Suggests stretching exercises and core-strengthening. Continue to monitor as this may be associated w/ Crestor.

## 2013-09-08 ENCOUNTER — Encounter: Payer: Self-pay | Admitting: Family Medicine

## 2013-10-03 ENCOUNTER — Encounter (HOSPITAL_COMMUNITY): Payer: Self-pay | Admitting: Emergency Medicine

## 2013-10-03 ENCOUNTER — Emergency Department (HOSPITAL_COMMUNITY)
Admission: EM | Admit: 2013-10-03 | Discharge: 2013-10-03 | Disposition: A | Discharge status: Home / Self Care | Payer: BC Managed Care – PPO | Attending: Emergency Medicine | Admitting: Emergency Medicine

## 2013-10-03 ENCOUNTER — Emergency Department (HOSPITAL_COMMUNITY): Payer: BC Managed Care – PPO

## 2013-10-03 DIAGNOSIS — R0981 Nasal congestion: Secondary | ICD-10-CM

## 2013-10-03 DIAGNOSIS — Z8701 Personal history of pneumonia (recurrent): Secondary | ICD-10-CM | POA: Insufficient documentation

## 2013-10-03 DIAGNOSIS — R0789 Other chest pain: Secondary | ICD-10-CM

## 2013-10-03 DIAGNOSIS — J3489 Other specified disorders of nose and nasal sinuses: Secondary | ICD-10-CM | POA: Insufficient documentation

## 2013-10-03 DIAGNOSIS — Z87442 Personal history of urinary calculi: Secondary | ICD-10-CM | POA: Insufficient documentation

## 2013-10-03 DIAGNOSIS — I10 Essential (primary) hypertension: Secondary | ICD-10-CM | POA: Insufficient documentation

## 2013-10-03 DIAGNOSIS — Z794 Long term (current) use of insulin: Secondary | ICD-10-CM | POA: Insufficient documentation

## 2013-10-03 DIAGNOSIS — E785 Hyperlipidemia, unspecified: Secondary | ICD-10-CM | POA: Insufficient documentation

## 2013-10-03 DIAGNOSIS — R51 Headache: Secondary | ICD-10-CM | POA: Insufficient documentation

## 2013-10-03 DIAGNOSIS — E119 Type 2 diabetes mellitus without complications: Secondary | ICD-10-CM | POA: Insufficient documentation

## 2013-10-03 DIAGNOSIS — R059 Cough, unspecified: Secondary | ICD-10-CM | POA: Insufficient documentation

## 2013-10-03 DIAGNOSIS — R05 Cough: Secondary | ICD-10-CM | POA: Insufficient documentation

## 2013-10-03 DIAGNOSIS — R071 Chest pain on breathing: Secondary | ICD-10-CM | POA: Insufficient documentation

## 2013-10-03 DIAGNOSIS — Z87891 Personal history of nicotine dependence: Secondary | ICD-10-CM | POA: Insufficient documentation

## 2013-10-03 DIAGNOSIS — Z79899 Other long term (current) drug therapy: Secondary | ICD-10-CM | POA: Insufficient documentation

## 2013-10-03 LAB — COMPREHENSIVE METABOLIC PANEL
ALT: 18 U/L (ref 0–35)
AST: 21 U/L (ref 0–37)
Albumin: 3.9 g/dL (ref 3.5–5.2)
Alkaline Phosphatase: 140 U/L — ABNORMAL HIGH (ref 39–117)
BUN: 13 mg/dL (ref 6–23)
CO2: 26 mEq/L (ref 19–32)
Calcium: 9.3 mg/dL (ref 8.4–10.5)
Chloride: 99 mEq/L (ref 96–112)
Creatinine, Ser: 0.85 mg/dL (ref 0.50–1.10)
GFR calc Af Amer: 90 mL/min — ABNORMAL LOW (ref >90)
GFR calc non Af Amer: 77 mL/min — ABNORMAL LOW (ref >90)
Glucose, Bld: 182 mg/dL — ABNORMAL HIGH (ref 70–99)
Potassium: 3.4 mEq/L — ABNORMAL LOW (ref 3.7–5.3)
Sodium: 139 mEq/L (ref 137–147)
Total Bilirubin: 0.5 mg/dL (ref 0.3–1.2)
Total Protein: 7.9 g/dL (ref 6.0–8.3)

## 2013-10-03 LAB — CBC WITH DIFFERENTIAL/PLATELET
Basophils Absolute: 0 10*3/uL (ref 0.0–0.1)
Basophils Relative: 0 % (ref 0–1)
Eosinophils Absolute: 0.2 10*3/uL (ref 0.0–0.7)
Eosinophils Relative: 4 % (ref 0–5)
HCT: 35.8 % — ABNORMAL LOW (ref 36.0–46.0)
Hemoglobin: 12 g/dL (ref 12.0–15.0)
Lymphocytes Relative: 39 % (ref 12–46)
Lymphs Abs: 2 10*3/uL (ref 0.7–4.0)
MCH: 26.7 pg (ref 26.0–34.0)
MCHC: 33.5 g/dL (ref 30.0–36.0)
MCV: 79.6 fL (ref 78.0–100.0)
Monocytes Absolute: 0.4 10*3/uL (ref 0.1–1.0)
Monocytes Relative: 7 % (ref 3–12)
Neutro Abs: 2.5 10*3/uL (ref 1.7–7.7)
Neutrophils Relative %: 49 % (ref 43–77)
Platelets: 218 10*3/uL (ref 150–400)
RBC: 4.5 MIL/uL (ref 3.87–5.11)
RDW: 13.3 % (ref 11.5–15.5)
WBC: 5.2 10*3/uL (ref 4.0–10.5)

## 2013-10-03 LAB — TROPONIN I: Troponin I: <0.3 ng/mL (ref <0.30)

## 2013-10-03 LAB — GLUCOSE, CAPILLARY: Glucose-Capillary: 167 mg/dL — ABNORMAL HIGH (ref 70–99)

## 2013-10-03 LAB — RAPID STREP SCREEN (MED CTR MEBANE ONLY): Streptococcus, Group A Screen (Direct): NEGATIVE

## 2013-10-03 MED ORDER — GUAIFENESIN ER 600 MG PO TB12: 1200.0000 mg | ORAL_TABLET | Freq: Two times a day (BID) | ORAL | Status: DC

## 2013-10-03 MED ORDER — CETIRIZINE-PSEUDOEPHEDRINE ER 5-120 MG PO TB12: 1.0000 | ORAL_TABLET | Freq: Two times a day (BID) | ORAL | Status: DC

## 2013-10-03 MED ORDER — OXYMETAZOLINE HCL 0.05 % NA SOLN
1.0000 | Freq: Once | NASAL | Status: AC
  Administered 2013-10-03: 1 via NASAL
  Filled 2013-10-03: qty 15

## 2013-10-03 MED ORDER — AMOXICILLIN 500 MG PO CAPS: 500.0000 mg | ORAL_CAPSULE | Freq: Three times a day (TID) | ORAL | Status: DC

## 2013-10-03 NOTE — ED Notes (Signed)
Pt c/o sinus and nasal congestion with green/yellow mucous. Pt states she has had a sore throat and headache. Symptoms for 2 weeks. Pt alert, with no acute distress. Skin warm and dry.

## 2013-10-03 NOTE — Discharge Instructions (Signed)
Your lab testing, chest x-ray and EKG of your heart did not show any concerning or emergent findings today. You have been given prescriptions for medications to help with your nasal congestion and cough. Please followup with your primary care provider for continued evaluation and treatment.   Sinusitis Sinusitis is redness, soreness, and swelling (inflammation) of the paranasal sinuses. Paranasal sinuses are air pockets within the bones of your face (beneath the eyes, the middle of the forehead, or above the eyes). In healthy paranasal sinuses, mucus is able to drain out, and air is able to circulate through them by way of your nose. However, when your paranasal sinuses are inflamed, mucus and air can become trapped. This can allow bacteria and other germs to grow and cause infection. Sinusitis can develop quickly and last only a short time (acute) or continue over a long period (chronic). Sinusitis that lasts for more than 12 weeks is considered chronic.  CAUSES  Causes of sinusitis include:  Allergies.  Structural abnormalities, such as displacement of the cartilage that separates your nostrils (deviated septum), which can decrease the air flow through your nose and sinuses and affect sinus drainage.  Functional abnormalities, such as when the small hairs (cilia) that line your sinuses and help remove mucus do not work properly or are not present. SYMPTOMS  Symptoms of acute and chronic sinusitis are the same. The primary symptoms are pain and pressure around the affected sinuses. Other symptoms include:  Upper toothache.  Earache.  Headache.  Bad breath.  Decreased sense of smell and taste.  A cough, which worsens when you are lying flat.  Fatigue.  Fever.  Thick drainage from your nose, which often is green and may contain pus (purulent).  Swelling and warmth over the affected sinuses. DIAGNOSIS  Your caregiver will perform a physical exam. During the exam, your caregiver  may:  Look in your nose for signs of abnormal growths in your nostrils (nasal polyps).  Tap over the affected sinus to check for signs of infection.  View the inside of your sinuses (endoscopy) with a special imaging device with a light attached (endoscope), which is inserted into your sinuses. If your caregiver suspects that you have chronic sinusitis, one or more of the following tests may be recommended:  Allergy tests.  Nasal culture A sample of mucus is taken from your nose and sent to a lab and screened for bacteria.  Nasal cytology A sample of mucus is taken from your nose and examined by your caregiver to determine if your sinusitis is related to an allergy. TREATMENT  Most cases of acute sinusitis are related to a viral infection and will resolve on their own within 10 days. Sometimes medicines are prescribed to help relieve symptoms (pain medicine, decongestants, nasal steroid sprays, or saline sprays).  However, for sinusitis related to a bacterial infection, your caregiver will prescribe antibiotic medicines. These are medicines that will help kill the bacteria causing the infection.  Rarely, sinusitis is caused by a fungal infection. In theses cases, your caregiver will prescribe antifungal medicine. For some cases of chronic sinusitis, surgery is needed. Generally, these are cases in which sinusitis recurs more than 3 times per year, despite other treatments. HOME CARE INSTRUCTIONS   Drink plenty of water. Water helps thin the mucus so your sinuses can drain more easily.  Use a humidifier.  Inhale steam 3 to 4 times a day (for example, sit in the bathroom with the shower running).  Apply a warm, moist  washcloth to your face 3 to 4 times a day, or as directed by your caregiver.  Use saline nasal sprays to help moisten and clean your sinuses.  Take over-the-counter or prescription medicines for pain, discomfort, or fever only as directed by your caregiver. SEEK IMMEDIATE  MEDICAL CARE IF:  You have increasing pain or severe headaches.  You have nausea, vomiting, or drowsiness.  You have swelling around your face.  You have vision problems.  You have a stiff neck.  You have difficulty breathing. MAKE SURE YOU:   Understand these instructions.  Will watch your condition.  Will get help right away if you are not doing well or get worse. Document Released: 09/11/2005 Document Revised: 12/04/2011 Document Reviewed: 09/26/2011 [facility] [employer] Patient Information 2014 [place], [place].

## 2013-10-03 NOTE — ED Provider Notes (Signed)
CSN: [id number]     Arrival date & time 10/03/13  1908 History  This chart was scribed for non-physician practitioner Jamse Mead, PA-C working with Babette Relic, MD by Ludger Nutting, ED Scribe. This patient was seen in room WTR6/WTR6 and the patient's care was started at 1908.    Chief Complaint  Patient presents with  . Sinus Congestion   . Generalized Body Aches    The history is provided by the patient. No language interpreter was used.    HPI Comments: Teresa Grant is a 53 y.o. female who presents to the Emergency Department complaining of 1 day of gradual onset, constant, gradually worsening nasal congestion. She also reports having a throbbing HA and cough productive of green/yellow sputum that also began last night. She reports having a "stinky" feet-like smell that she has noticed when breathing through her nose. She reports having sneezing, sore throat, postnasal drip that began several days ago. She also reports having right sided neck pain beginning today and right sided chest pain that began 3 days ago. She describes this chest pain as sore and is not changed with movement or activity. She denies fever, chills, difficulty swallowing, SOB, left sided chest pain. 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 She has had her seasonal flu vaccine.    Past Medical History  Diagnosis Date  . Hypertension   . Diabetes mellitus   . HLD (hyperlipidemia)   . Pneumonia     7 yrs ago  . Dysrhythmia     1985  . Kidney stone on right side 03/2013   Past Surgical History  Procedure Laterality Date  . Lumbar laminectomy/decompression microdiscectomy  03/20/2012    Procedure: LUMBAR LAMINECTOMY/DECOMPRESSION MICRODISCECTOMY;  Surgeon: Sinclair Ship, MD;  Location: [facility];  Service: Orthopedics;  Laterality: Left;  Left sided lumbar 4-5 microdisectomy  . Spine surgery  2013  . Cesarean section  1990  . Abdominal hysterectomy  2001   Family History  Problem  Relation Age of Onset  . Stroke Neg Hx   . Cancer Neg Hx   . Heart disease Mother   . Kidney disease Father    History  Substance Use Topics  . Smoking status: Former Smoker    Quit date: 04/29/2004  . Smokeless tobacco: Not on file  . Alcohol Use: No   OB History   Grav Para Term Preterm Abortions TAB SAB Ect Mult Living                 Review of Systems  Constitutional: Negative for fever and chills.  HENT: Positive for congestion, postnasal drip, sneezing and sore throat. Negative for trouble swallowing.   Eyes: Negative for visual disturbance.  Respiratory: Positive for cough. Negative for shortness of breath.   Cardiovascular: Positive for chest pain (right sided).  Gastrointestinal: Negative for nausea, vomiting and abdominal pain.  Musculoskeletal: Positive for myalgias.  Neurological: Positive for headaches.  All other systems reviewed and are negative.    Allergies  Lisinopril and Hydrochlorothiazide  Home Medications   Current Outpatient Rx  Name  Route  Sig  Dispense  Refill  . amLODipine (NORVASC) 10 MG tablet   Oral   Take 10 mg by mouth at bedtime.         Marland Kitchen glipiZIDE (GLUCOTROL XL) 10 MG 24 hr tablet   Oral   Take 10 mg by mouth daily.         . Insulin Glargine (LANTUS SOLOSTAR)  100 UNIT/ML SOPN   Subcutaneous   Inject 30 Units into the skin at bedtime.         . metFORMIN (GLUCOPHAGE) 1000 MG tablet   Oral   Take 1,000 mg by mouth 2 (two) times daily with a meal.         . rosuvastatin (CRESTOR) 10 MG tablet   Oral   Take 10 mg by mouth daily.          BP 133/76  Pulse 95  Temp(Src) 98.8 F (37.1 C) (Oral)  Resp 17  SpO2 97% Physical Exam  Nursing note and vitals reviewed. Constitutional: She is oriented to person, place, and time. She appears well-developed and well-nourished.  HENT:  Head: Normocephalic and atraumatic.  Right Ear: External ear normal.  Left Ear: External ear normal.  Mouth/Throat: Oropharynx is clear  and moist. No oropharyngeal exudate.  Negative facial swelling Negative pain upon palpation to the frontal and maxillary sinuses bilaterally Negative swelling, erythema, inflammation, lesions, sores, petechiae, exudate noted to the posterior oropharynx and bilateral tonsils. Negative uvula swelling. Uvula midline with symmetrical elevation.   Eyes: Conjunctivae and EOM are normal. Pupils are equal, round, and reactive to light. Right eye exhibits no discharge. Left eye exhibits no discharge.  Neck: Normal range of motion. Neck supple.  Negative neck stiffness Negative nuchal rigidity Cervical lymphadenopathy Negative meningeal signs  Cardiovascular: Normal rate, regular rhythm and normal heart sounds.  Exam reveals no friction rub.   No murmur heard. Pulses:      Radial pulses are 2+ on the right side, and 2+ on the left side.  Pulmonary/Chest: Effort normal and breath sounds normal. No respiratory distress. She has no wheezes. She has no rales. She exhibits tenderness.    Patient is able to speak in full senses without difficulty Negative use of accessory muscles Negative sign of respiratory distress Discomfort upon palpation to the right side of the chest - pain reproducible upon palpation  Abdominal: She exhibits no distension.  Musculoskeletal: Normal range of motion.  Full ROM to upper and lower extremities without difficulty noted, negative ataxia noted.  Lymphadenopathy:    She has no cervical adenopathy.  Neurological: She is alert and oriented to person, place, and time. She exhibits normal muscle tone. Coordination normal.  Skin: Skin is warm and dry. No rash noted. No erythema.  Psychiatric: She has a normal mood and affect. Her behavior is normal. Thought content normal.    ED Course  Procedures (including critical care time)  DIAGNOSTIC STUDIES: Oxygen Saturation is 97% on RA, adequate by my interpretation.    COORDINATION OF CARE: 7:51 PM Discussed treatment plan  with pt at bedside and pt agreed to plan.   Results for orders placed during the hospital encounter of 10/03/13  RAPID STREP SCREEN      Result Value Range   Streptococcus, Group A Screen (Direct) NEGATIVE  NEGATIVE  GLUCOSE, CAPILLARY      Result Value Range   Glucose-Capillary 167 (*) 70 - 99 mg/dL    Labs Review Labs Reviewed  GLUCOSE, CAPILLARY - Abnormal; Notable for the following:    Glucose-Capillary 167 (*)    All other components within normal limits  RAPID STREP SCREEN  CULTURE, GROUP A STREP  TROPONIN I  CBC WITH DIFFERENTIAL  COMPREHENSIVE METABOLIC PANEL   Imaging Review Dg Chest 2 View  10/03/2013   CLINICAL DATA:  Cough.  EXAM: CHEST  2 VIEW  COMPARISON:  March 18, 2012.  FINDINGS:  The heart size and mediastinal contours are within normal limits. Both lungs are clear. No pleural effusion or pneumothorax is noted. The visualized skeletal structures are unremarkable.  IMPRESSION: No active cardiopulmonary disease.   Electronically Signed   By: Sabino Dick M.D.   On: 10/03/2013 20:16    EKG Interpretation   None       MDM  No diagnosis found.  Filed Vitals:   10/03/13 1926  BP: 133/76  Pulse: 95  Temp: 98.8 F (37.1 C)  TempSrc: Oral  Resp: 17  SpO2: 97%   I personally performed the services described in this documentation, which was scribed in my presence. The recorded information has been reviewed and is accurate.  Patient presenting to emergency department with nasal congestion, productive cough of greenish/yellowish phlegm, chest soreness localize the right side with deep inhalation and cough that has been ongoing for the past 2 weeks. Patient reports she received her flu vaccination. Reported that she's been using Tylenol as needed for discomfort. 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  Alert and oriented. GCS 15. Heart rate and rhythm normal. Lungs clear to auscultation to upper and lower lobes bilaterally. Discomfort upon palpation to the  right side of the chest. Negative frontal and maxillary sinus pain upon palpation. Negative findings on ear exam. Negative neck stiffness, negative nuchal rigidity. Negative meningeal signs. Oral exam unremarkable findings.  Chest xray negative for acute cardiopulmonary disease. Rapid strep test negative.   9:01 PM Discussed chest xray results with patient. Patient reported that chest pain is localized to the right side - described as an aching, soreness. Reported worse with coughing and worse with deep inhalations. Patient reported that she experiences chest pain even at rest with no activity. Concern regarding ACS possible since patient is at proper age and numerous risk factors based on commorbidities. EKG ordered and pending. Blood work of CBC, CMP, and Troponin ordered and pending. Discussed case with Ruthell Rummage. Dammen, PA-C at change in shift. Transfer of care to [employer]. Dammen, PA-C at change in shift.   Jamse Mead, PA-C 10/04/13 [phone]

## 2013-10-03 NOTE — ED Provider Notes (Signed)
Lai Hendriks S 8:40 PM patient discussed and signed out. Patient is a 53 year old female with history of hypertension diabetes presents with complaints of nasal congestion and poor air movement through the nose. Patient states symptoms have been worsening for the past 2 weeks. Associated with cough, sore throat and headache. Patient has also had some right-sided chest wall pains with coughing and certain movements.  Patient was seen and evaluated. She is well appearing. Afebrile. Nontoxic-appearing. She has reproducible right chest wall pain and muscular pain near the right scapula. Heart regular rate and rhythm no murmurs. Lungs clear no wheezing. Chest x-ray unremarkable. Slight hyperglycemia. Negative strep test. Additional lab testing has been ordered for patient's atypical chest wall pain including CBC, CMP, troponin and EKG. Will await test results. We'll plan to treat nasal congestion with Afrin at this time.  No significant findings on lab testing. Patient will be discharged with symptomatic treatment for her nasal congestion and cough.       Results for orders placed during the hospital encounter of 10/03/13  RAPID STREP SCREEN      Result Value Range   Streptococcus, Group A Screen (Direct) NEGATIVE  NEGATIVE  GLUCOSE, CAPILLARY      Result Value Range   Glucose-Capillary 167 (*) 70 - 99 mg/dL  TROPONIN I      Result Value Range   Troponin I <0.30  <0.30 ng/mL  CBC WITH DIFFERENTIAL      Result Value Range   WBC 5.2  4.0 - 10.5 K/uL   RBC 4.50  3.87 - 5.11 MIL/uL   Hemoglobin 12.0  12.0 - 15.0 g/dL   HCT 35.8 (*) 36.0 - 46.0 %   MCV 79.6  78.0 - 100.0 fL   MCH 26.7  26.0 - 34.0 pg   MCHC 33.5  30.0 - 36.0 g/dL   RDW 13.3  11.5 - 15.5 %   Platelets 218  150 - 400 K/uL   Neutrophils Relative % 49  43 - 77 %   Neutro Abs 2.5  1.7 - 7.7 K/uL   Lymphocytes Relative 39  12 - 46 %   Lymphs Abs 2.0  0.7 - 4.0 K/uL   Monocytes Relative 7  3 - 12 %   Monocytes Absolute 0.4  0.1  - 1.0 K/uL   Eosinophils Relative 4  0 - 5 %   Eosinophils Absolute 0.2  0.0 - 0.7 K/uL   Basophils Relative 0  0 - 1 %   Basophils Absolute 0.0  0.0 - 0.1 K/uL  COMPREHENSIVE METABOLIC PANEL      Result Value Range   Sodium 139  137 - 147 mEq/L   Potassium 3.4 (*) 3.7 - 5.3 mEq/L   Chloride 99  96 - 112 mEq/L   CO2 26  19 - 32 mEq/L   Glucose, Bld 182 (*) 70 - 99 mg/dL   BUN 13  6 - 23 mg/dL   Creatinine, Ser 0.85  0.50 - 1.10 mg/dL   Calcium 9.3  8.4 - 10.5 mg/dL   Total Protein 7.9  6.0 - 8.3 g/dL   Albumin 3.9  3.5 - 5.2 g/dL   AST 21  0 - 37 U/L   ALT 18  0 - 35 U/L   Alkaline Phosphatase 140 (*) 39 - 117 U/L   Total Bilirubin 0.5  0.3 - 1.2 mg/dL   GFR calc non Af Amer 77 (*) >90 mL/min   GFR calc Af Amer 90 (*) >90 mL/min  Dg Chest 2 View  10/03/2013   CLINICAL DATA:  Cough.  EXAM: CHEST  2 VIEW  COMPARISON:  March 18, 2012.  FINDINGS: The heart size and mediastinal contours are within normal limits. Both lungs are clear. No pleural effusion or pneumothorax is noted. The visualized skeletal structures are unremarkable.  IMPRESSION: No active cardiopulmonary disease.   Electronically Signed   By: Sabino Dick M.D.   On: 10/03/2013 20:16     Date: 10/03/2013  Rate: 86  Rhythm: normal sinus rhythm  QRS Axis: normal  Intervals: normal  ST/T Wave abnormalities: nonspecific T wave changes  Conduction Disutrbances:none  Narrative Interpretation: Slight T-wave inversion in lead 3 similar to previous. Flattened T waves in lateral leads  Old EKG Reviewed: No significant changes from 04/25/2013    Martie Lee, PA-C 10/03/13 2218

## 2013-10-04 NOTE — ED Provider Notes (Signed)
Medical screening examination/treatment/procedure(s) were performed by non-physician practitioner and as supervising physician I was immediately available for consultation/collaboration.  EKG Interpretation    Date/Time:  Friday October 03 2013 21:08:32 EST Ventricular Rate:  86 PR Interval:  148 QRS Duration: 88 QT Interval:  357 QTC Calculation: 427 R Axis:   33 Text Interpretation:  Sinus rhythm No significant change since last tracing Confirmed by STEINL  MD, KEVIN ([id number]) on 10/03/2013 9:18:00 PM             Vicci Reder M Caliann Leckrone, MD 10/04/13 1236 

## 2013-10-04 NOTE — ED Provider Notes (Signed)
Medical screening examination/treatment/procedure(s) were performed by non-physician practitioner and as supervising physician I was immediately available for consultation/collaboration.  EKG Interpretation    Date/Time:  Friday October 03 2013 21:08:32 EST Ventricular Rate:  86 PR Interval:  148 QRS Duration: 88 QT Interval:  357 QTC Calculation: 427 R Axis:   33 Text Interpretation:  Sinus rhythm No significant change since last tracing Confirmed by Ashok Cordia  MD, KEVIN ([id number]) on 10/03/2013 9:18:00 PM             Babette Relic, MD 10/04/13 1236

## 2013-10-05 LAB — CULTURE, GROUP A STREP

## 2013-11-03 ENCOUNTER — Telehealth: Payer: Self-pay

## 2013-11-03 NOTE — Telephone Encounter (Signed)
PT STATES SHE HAVE AN APPT IN MARCH TO SEE DR [facility] AND IS IN NEED OF HER CRESTOR AND NORVASC, ENOUGH TO GET HER THROUGH UNTIL SEEN BY DR PLEASE CALL [place] ON [address]

## 2013-11-05 MED ORDER — AMLODIPINE BESYLATE 10 MG PO TABS: 10.0000 mg | ORAL_TABLET | Freq: Every day | ORAL | Status: DC

## 2013-11-05 MED ORDER — ROSUVASTATIN CALCIUM 10 MG PO TABS: 10.0000 mg | ORAL_TABLET | Freq: Every day | ORAL | Status: DC

## 2013-11-05 NOTE — Telephone Encounter (Signed)
Sent in RFs and notified pt on VM. 

## 2013-12-11 ENCOUNTER — Encounter: Payer: Self-pay | Admitting: Family Medicine

## 2013-12-11 ENCOUNTER — Ambulatory Visit (INDEPENDENT_AMBULATORY_CARE_PROVIDER_SITE_OTHER): Payer: BC Managed Care – PPO | Admitting: Family Medicine

## 2013-12-11 ENCOUNTER — Ambulatory Visit: Payer: BC Managed Care – PPO

## 2013-12-11 VITALS — BP 138/74 | HR 84 | Temp 98.5°F | Resp 16 | Ht 62.0 in | Wt 167.2 lb

## 2013-12-11 DIAGNOSIS — E1165 Type 2 diabetes mellitus with hyperglycemia: Principal | ICD-10-CM

## 2013-12-11 DIAGNOSIS — M2140 Flat foot [pes planus] (acquired), unspecified foot: Secondary | ICD-10-CM

## 2013-12-11 DIAGNOSIS — M79609 Pain in unspecified limb: Secondary | ICD-10-CM

## 2013-12-11 DIAGNOSIS — M79672 Pain in left foot: Secondary | ICD-10-CM

## 2013-12-11 DIAGNOSIS — IMO0001 Reserved for inherently not codable concepts without codable children: Secondary | ICD-10-CM

## 2013-12-11 DIAGNOSIS — M2142 Flat foot [pes planus] (acquired), left foot: Secondary | ICD-10-CM

## 2013-12-11 DIAGNOSIS — M2141 Flat foot [pes planus] (acquired), right foot: Secondary | ICD-10-CM

## 2013-12-11 LAB — POCT GLYCOSYLATED HEMOGLOBIN (HGB A1C): Hemoglobin A1C: 7.8

## 2013-12-11 MED ORDER — METFORMIN HCL 1000 MG PO TABS: 1000.0000 mg | ORAL_TABLET | Freq: Two times a day (BID) | ORAL | Status: DC

## 2013-12-11 MED ORDER — GABAPENTIN 100 MG PO CAPS: ORAL_CAPSULE | ORAL | Status: DC

## 2013-12-11 MED ORDER — GLIPIZIDE ER 10 MG PO TB24: 10.0000 mg | ORAL_TABLET | Freq: Every day | ORAL | Status: DC

## 2013-12-11 MED ORDER — AMLODIPINE BESYLATE 10 MG PO TABS: 10.0000 mg | ORAL_TABLET | Freq: Every day | ORAL | Status: DC

## 2013-12-11 MED ORDER — INSULIN GLARGINE 100 UNIT/ML SOLOSTAR PEN: 30.0000 [IU] | PEN_INJECTOR | Freq: Every day | SUBCUTANEOUS | Status: DC

## 2013-12-11 NOTE — Progress Notes (Signed)
S:  This 53 y.o. AA female has Type II DM, not very well controlled. Pt checks FSBS 2-3 times a week. She is compliant w/ medications but has not managed nutrition or weight well.  Main c/o L foot pain and burning w/ swelling at bases of lateral 3 toes. Remote hx of trauma about 1 year ago. Wears a pair of Dr. Felicie Morn shoes that she recently bought to reduce foot fatigue.  Patient Active Problem List   Diagnosis Date Noted  . Type II or unspecified type diabetes mellitus without mention of complication, uncontrolled 09/05/2013  . Renal calculus, right 06/12/2013  . Diabetes with proteinuria 11/05/2012  . DDD (degenerative disc disease), lumbar 06/23/2012  . HYPERLIPIDEMIA 11/27/2006  . HYPERTENSION 11/27/2006   Prior to Admission medications   Medication Sig Start Date End Date Taking? Authorizing Provider  amLODipine (NORVASC) 10 MG tablet Take 1 tablet (10 mg total) by mouth at bedtime.   Yes Barton Fanny, MD  glipiZIDE (GLUCOTROL XL) 10 MG 24 hr tablet Take 1 tablet (10 mg total) by mouth daily.   Yes Barton Fanny, MD  Insulin Glargine (LANTUS SOLOSTAR) 100 UNIT/ML Solostar Pen Inject 30 Units into the skin at bedtime.   Yes Barton Fanny, MD  metFORMIN (GLUCOPHAGE) 1000 MG tablet Take 1 tablet (1,000 mg total) by mouth 2 (two) times daily with a meal.   Yes Barton Fanny, MD  cetirizine-pseudoephedrine (ZYRTEC-D) 5-120 MG per tablet Take 1 tablet by mouth 2 (two) times daily. 10/03/13   Ruthell Rummage Dammen, PA-C         guaiFENesin (MUCINEX) 600 MG 12 hr tablet Take 2 tablets (1,200 mg total) by mouth 2 (two) times daily. 10/03/13   Ruthell Rummage Dammen, PA-C  rosuvastatin (CRESTOR) 10 MG tablet Take 1 tablet (10 mg total) by mouth daily. 11/05/13   Barton Fanny, MD   PMHx, Surg Hx, Soc and Fam Hx reviewed.  ROS: As per HPI; negative for fatigue, diaphoresis, abnormal weight loss or gain, CP or tightness, palpitations, SOB or DOE, cough, GI upset, HA, dizziness,  numbness or weakness, tremor or syncope.  O: Filed Vitals:   12/11/13 1018  BP: 138/74  Pulse: 84  Temp: 98.5 F (36.9 C)  Resp: 16   GEN; in NAD; WN,WD. HENT: [place]/AT; EOMI w/ clear conj/sclerae. Otherwise unremarkable. COR: RRR. No edema. LUNGS: Normal resp rate and effort. SKIN: W&D; intact w/o diaphoresis, erythema or rashes. MS: L foot- minimal STS at base of toes; tender to palpation at distal aspect of MTPs of lateral 3 digits. Neurovascular intact. Flat feet bilaterally w/ fallen arches. NEURO: A&O x 3; CNs intact. Nonfocal.  Results for orders placed in visit on 12/11/13  POCT GLYCOSYLATED HEMOGLOBIN (HGB A1C)      Result Value Ref Range   Hemoglobin A1C 7.8      [facility] reading (PRIMARY) by  Dr. Leward Quan: Left foot- normal appearance w/o fracture or dislocation.  A/P: Type II or unspecified type diabetes mellitus without mention of complication, uncontrolled - Encouraged improved monitoring, nutrition planning and increased physical activity. Goal A1c= 7.0%. Plan: POCT glycosylated hemoglobin (Hb A1C)  Foot pain, left - Plan: DG Foot Complete Left, Ambulatory referral to Podiatry  Fallen arches  Meds ordered this encounter  Medications  . gabapentin (NEURONTIN) 100 MG capsule    Sig: Take 1 cap at bedtime for 3 nights then increase to 2 caps at bedtime.    Dispense:  90 capsule    Refill:  2  . amLODipine (NORVASC) 10 MG tablet    Sig: Take 1 tablet (10 mg total) by mouth at bedtime.    Dispense:  30 tablet    Refill:  11  . glipiZIDE (GLUCOTROL XL) 10 MG 24 hr tablet    Sig: Take 1 tablet (10 mg total) by mouth daily.    Dispense:  30 tablet    Refill:  11  . Insulin Glargine (LANTUS SOLOSTAR) 100 UNIT/ML Solostar Pen    Sig: Inject 30 Units into the skin at bedtime.    Dispense:  15 mL    Refill:  11  . metFORMIN (GLUCOPHAGE) 1000 MG tablet    Sig: Take 1 tablet (1,000 mg total) by mouth 2 (two) times daily with a meal.    Dispense:  60 tablet     Refill:  11

## 2013-12-30 ENCOUNTER — Ambulatory Visit (INDEPENDENT_AMBULATORY_CARE_PROVIDER_SITE_OTHER): Payer: BC Managed Care – PPO

## 2013-12-30 VITALS — BP 136/86 | HR 81 | Resp 16 | Ht 62.0 in | Wt 167.0 lb

## 2013-12-30 DIAGNOSIS — E1140 Type 2 diabetes mellitus with diabetic neuropathy, unspecified: Secondary | ICD-10-CM

## 2013-12-30 DIAGNOSIS — M79609 Pain in unspecified limb: Secondary | ICD-10-CM

## 2013-12-30 DIAGNOSIS — E1142 Type 2 diabetes mellitus with diabetic polyneuropathy: Secondary | ICD-10-CM

## 2013-12-30 DIAGNOSIS — G5760 Lesion of plantar nerve, unspecified lower limb: Secondary | ICD-10-CM

## 2013-12-30 DIAGNOSIS — R609 Edema, unspecified: Secondary | ICD-10-CM

## 2013-12-30 DIAGNOSIS — E1149 Type 2 diabetes mellitus with other diabetic neurological complication: Secondary | ICD-10-CM

## 2013-12-30 MED ORDER — GABAPENTIN 300 MG PO CAPS: 300.0000 mg | ORAL_CAPSULE | Freq: Three times a day (TID) | ORAL | Status: DC

## 2013-12-30 NOTE — Progress Notes (Signed)
   Subjective:    Patient ID: Teresa Grant, female    DOB: 1961/09/12, 53 y.o.   MRN: [id number]  HPI N foot pain        L B/L forefoot  Right > left        D right - 1 month, and left - 1 week        O         C cramping pain, decrease range of motion, and occasional burning sensation, painful to touch        A walking        T referred by Dr. Leward Quan and she prescribed Neurotin    Review of Systems  Constitutional: Positive for fatigue.  Eyes: Positive for redness and itching.  Respiratory: Negative.   Cardiovascular: Negative.   Gastrointestinal: Negative.   Endocrine: Positive for polyuria.  Genitourinary: Positive for frequency.  Musculoskeletal: Positive for arthralgias and gait problem.  Skin: Negative.   Allergic/Immunologic: Negative.   Neurological: Negative.   Hematological: Negative.   Psychiatric/Behavioral: Negative.        Objective:   Physical Exam Functioning objective findings follows vascular status appears to be intact pedal pulses palpable DP postal for PT plus one over 4 there is applied and plus one mild edema over the dorsum of both feet. Epicritic and proprioceptive sensations intact and symmetric bilateral there is normal plantar response DTRs are listed there is pain on direct lateral compression the forefoot left much more so than right left For month we'll right listed a week ago. There is pain with any compression is restricted wrapping the foot with Ace wrap it got even worse at this time there is pain on direct lateral compression second and third interspace third space the season most significant on the left foot. Patient also has a history of diabetes with neuropathy and complications to starting gabapentin only 01 100 mg at bedtime and is recently increased to her milligrams last week for fitting shoes are very low dose for the nature of her neuropathy and neuralgia. Patient indicates her last A5c was [age]-year-old at high shoes her blood  glucoses little bit off recently. At this time no history of injury or trauma noted patient wearing some shoes or sandals that are slip on an adjustable may be also because of compression if she swelling she drives but the feet gabapentin which is likely causing some swelling in her feet recommended a lace up athletic or Oxford type shoe as adjustable for the amount of edema that she gets. Also recommended elevation ice pack when possible.       Assessment & Plan:  Assessment this time patient does have findings consistent with either early neuroma x-rays reveal no fracture or other osseous abnormality rectus digits are noted no fractures cyst tumors or lesions are noted. Cannot rule out early neuroma symptomology secondary most significant third interspace on direct lateral compression this time patient will up to gabapentin 3 mg at bedtime and Inderal to a 3 month course twice a day and then eventually 3 times a day prescription for gabapentin 300 3 times a day is dispensed at this time she will titrate her doses of beginning with when she started taking reappointed 3-4 weeks for followup may be K. for additional treatments based on progress

## 2013-12-30 NOTE — Progress Notes (Deleted)
   Subjective:    Patient ID: Teresa Grant, female    DOB: January 12, 1961, 53 y.o.   MRN: [id number]  HPI Comments: This is a test   Foot Pain      Review of Systems     Objective:   Physical Exam        Assessment & Plan:

## 2013-12-30 NOTE — Patient Instructions (Signed)
Morton's Neuroma Neuralgia (nerve pain) or neuroma (benign [non-cancerous] nerve tumor) may develop on any interdigital nerve. The interdigital nerves (nerves between digits) of the foot travel beneath and between the metatarsals (long bones of the fore foot) and pass the nerve endings to the toes. The third interdigital is a common place for a small neuroma to form called Morton's neuroma. Another nerve to be affected commonly is the fourth interdigital nerve. This would be in approximately in the area of the base or ball under the bottom of your fourth toe. This condition occurs more commonly in women and is usually on one side. It is usually first noticed by pain radiating (spreading) to the ball of the foot or to the toes. CAUSES The cause of interdigital neuralgia may be from low grade repetitive trauma (damage caused by an accident) as in activities causing a repeated pounding of the foot (running, jumping etc.). It is also caused by improper footwear or recent loss of the fatty padding on the bottom of the foot. TREATMENT  The condition often resolves (goes away) simply with decreasing activity if that is thought to be the cause. Proper shoes are beneficial. Orthotics (special foot support aids) such as a metatarsal bar are often beneficial. This condition usually responds to conservative therapy, however if surgery is necessary it usually brings complete relief. HOME CARE INSTRUCTIONS   Apply ice to the area of soreness for 15-20 minutes, 03-04 times per day, while awake for the first 2 days. Put ice in a plastic bag and place a towel between the bag of ice and your skin.  Only take over-the-counter or prescription medicines for pain, discomfort, or fever as directed by your caregiver. MAKE SURE YOU:   Understand these instructions.  Will watch your condition.  Will get help right away if you are not doing well or get worse. Document Released: 12/18/2000 Document Revised: 12/04/2011  Document Reviewed: 09/11/2005 [facility] Patient Information 2014 [place]. ICE INSTRUCTIONS  Apply ice or cold pack to the affected area at least 3 times a day for 10-15 minutes each time.  You should also use ice after prolonged activity or vigorous exercise.  Do not apply ice longer than 20 minutes at one time.  Always keep a cloth between your skin and the ice pack to prevent burns.  Being consistent and following these instructions will help control your symptoms.  We suggest you purchase a gel ice pack because they are reusable and do bit leak.  Some of them are designed to wrap around the area.  Use the method that works best for you.  Here are some other suggestions for icing.   Use a frozen bag of peas or corn-inexpensive and molds well to your body, usually stays frozen for 10 to 20 minutes.  Wet a towel with cold water and squeeze out the excess until it's damp.  Place in a bag in the freezer for 20 minutes. Then remove and use.

## 2014-01-05 ENCOUNTER — Telehealth: Payer: Self-pay

## 2014-01-05 MED ORDER — AMLODIPINE BESYLATE 10 MG PO TABS: 10.0000 mg | ORAL_TABLET | Freq: Every day | ORAL | Status: DC

## 2014-01-05 NOTE — Telephone Encounter (Signed)
One year supply resent to the pharmacy- pt seen on 12/07/13 and it was sent then.

## 2014-01-05 NOTE — Telephone Encounter (Signed)
PT STATES SHE TRIED GETTING HER NORVASC REFILLED BUT WAS TOLD BY THE PHARMACY IT HAD EXPIRED. SHE IS OUT AND REALLY NEED TO GET A REFILL PLEASE CALL [phone]    [facility] [address]

## 2014-01-27 ENCOUNTER — Ambulatory Visit: Payer: BC Managed Care – PPO

## 2014-03-09 ENCOUNTER — Telehealth: Payer: Self-pay

## 2014-03-09 NOTE — Telephone Encounter (Signed)
2 days of left abdomen pain. Recommended RTC.

## 2014-03-09 NOTE — Telephone Encounter (Signed)
Called patient and she states she can not talk now, call her back later.

## 2014-03-09 NOTE — Telephone Encounter (Signed)
Pt called because she is having pain in her left abdomin, would like to speak with a nurse

## 2014-03-17 ENCOUNTER — Ambulatory Visit: Payer: BC Managed Care – PPO

## 2014-03-31 ENCOUNTER — Ambulatory Visit: Payer: BC Managed Care – PPO

## 2014-04-16 ENCOUNTER — Encounter: Payer: Self-pay | Admitting: Family Medicine

## 2014-04-16 ENCOUNTER — Ambulatory Visit (INDEPENDENT_AMBULATORY_CARE_PROVIDER_SITE_OTHER): Payer: BC Managed Care – PPO | Admitting: Family Medicine

## 2014-04-16 VITALS — BP 130/78 | HR 85 | Temp 97.9°F | Resp 16 | Ht 62.5 in | Wt 168.2 lb

## 2014-04-16 DIAGNOSIS — E876 Hypokalemia: Secondary | ICD-10-CM

## 2014-04-16 DIAGNOSIS — IMO0001 Reserved for inherently not codable concepts without codable children: Secondary | ICD-10-CM

## 2014-04-16 DIAGNOSIS — Z1159 Encounter for screening for other viral diseases: Secondary | ICD-10-CM

## 2014-04-16 DIAGNOSIS — I10 Essential (primary) hypertension: Secondary | ICD-10-CM

## 2014-04-16 DIAGNOSIS — E1165 Type 2 diabetes mellitus with hyperglycemia: Principal | ICD-10-CM

## 2014-04-16 LAB — BASIC METABOLIC PANEL
BUN: 10 mg/dL (ref 6–23)
CO2: 25 mEq/L (ref 19–32)
Calcium: 9.7 mg/dL (ref 8.4–10.5)
Chloride: 105 mEq/L (ref 96–112)
Creat: 0.8 mg/dL (ref 0.50–1.10)
Glucose, Bld: 130 mg/dL — ABNORMAL HIGH (ref 70–99)
Potassium: 3.9 mEq/L (ref 3.5–5.3)
Sodium: 140 mEq/L (ref 135–145)

## 2014-04-16 LAB — HEPATITIS C ANTIBODY: HCV Ab: NEGATIVE

## 2014-04-16 LAB — POCT GLYCOSYLATED HEMOGLOBIN (HGB A1C): Hemoglobin A1C: 8.7

## 2014-04-16 MED ORDER — GABAPENTIN 300 MG PO CAPS: 300.0000 mg | ORAL_CAPSULE | Freq: Three times a day (TID) | ORAL | Status: DC

## 2014-04-16 NOTE — Patient Instructions (Addendum)
A1c is above 8%.  You need to increase Insulin to 35 units at bedtime.  Continue to stay active and be mindful about what you are eating. Stay on all other medications. If your A1c remains elevated, I will have to add mealtime Insulin.  I will see you again in 3 months.

## 2014-04-16 NOTE — Progress Notes (Signed)
Subjective:    Patient ID: Teresa Grant, female    DOB: 01/08/1961, 53 y.o.   MRN: [id number]  HPI  This 53 y.o. AA female has uncontrolled Type II DM; she reports FSBS 120-150 (nothing below 100 and 1-2 episodes above 200). Pt forgot her glucometer w/ memory. She denies missing any doses of medications. She and her husband have made a greater effort to walk more. 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 Pt is considering retirement and plans to resume her study towards medical coding, etc.  HTN- Medication compliance is good. Evaluation with [facility] is complete. No c/o diaphoresis, fatigue, CP or tightness, palpitations, edema, HA, dizziness, weakness, numbness or syncope.  Episodic hypokalemia needs to be monitored. Pt does not report muscle cramps, weakness or mental confusion.  Patient Active Problem List   Diagnosis Date Noted  . Type II or unspecified type diabetes mellitus without mention of complication, uncontrolled 09/05/2013  . Renal calculus, right 06/12/2013  . Diabetes with proteinuria 11/05/2012  . DDD (degenerative disc disease), lumbar 06/23/2012  . HYPERLIPIDEMIA 11/27/2006  . HYPERTENSION 11/27/2006    Prior to Admission medications   Medication Sig Start Date End Date Taking? Authorizing Provider  amLODipine (NORVASC) 10 MG tablet Take 1 tablet (10 mg total) by mouth at bedtime. 01/05/14  Yes Solon, PA-C  gabapentin (NEURONTIN) 300 MG capsule Take 1 capsule (300 mg total) by mouth 3 (three) times daily.   Yes Barton Fanny, MD  glipiZIDE (GLUCOTROL XL) 10 MG 24 hr tablet Take 1 tablet (10 mg total) by mouth daily. 12/11/13  Yes Barton Fanny, MD  Insulin Glargine (LANTUS SOLOSTAR) 100 UNIT/ML Solostar Pen Inject 30 Units into the skin at bedtime. 12/11/13  Yes Barton Fanny, MD  metFORMIN (GLUCOPHAGE) 1000 MG tablet Take 1 tablet (1,000 mg total) by mouth 2 (two) times daily with a meal.  12/11/13  Yes Barton Fanny, MD   Allergies  Allergen Reactions  . Lisinopril Cough  . Hydrochlorothiazide Other (See Comments)    pancreatitis    PMHx, Surg Hx, Soc and Fam Hx reviewed.   Review of Systems  Constitutional: Negative.   Eyes: Negative.   Respiratory: Negative.   Cardiovascular: Negative.   Gastrointestinal: Negative.   Musculoskeletal: Negative.   Neurological: Negative.   Psychiatric/Behavioral: Negative.       Objective:   Physical Exam  Nursing note and vitals reviewed. Constitutional: She is oriented to person, place, and time. She appears well-developed and well-nourished. No distress.  HENT:  Head: Normocephalic and atraumatic.  Right Ear: External ear normal.  Left Ear: External ear normal.  Nose: Nose normal.  Mouth/Throat: Oropharynx is clear and moist.  Eyes: Conjunctivae and EOM are normal. Pupils are equal, round, and reactive to light. No scleral icterus.  Neck: Normal range of motion. Neck supple.  Cardiovascular: Normal rate and regular rhythm.   Pulmonary/Chest: Effort normal. No respiratory distress.  Musculoskeletal: She exhibits no edema and no tenderness.  Neurological: She is alert and oriented to person, place, and time. No cranial nerve deficit. She exhibits normal muscle tone. Coordination normal.  Skin: Skin is warm. She is not diaphoretic.  Psychiatric: She has a normal mood and affect. Her behavior is normal. Judgment and thought content normal.    Results for orders placed in visit on 04/16/14  POCT GLYCOSYLATED HEMOGLOBIN (HGB A1C)      Result Value Ref Range  Hemoglobin A1C 8.7        Assessment & Plan:  Type II or unspecified type diabetes mellitus without mention of complication, uncontrolled - Increase Lantus to 35 units at bedtime. Continue daily fitness activity and nutrition plan. Continue oral medications. 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              Plan: POCT glycosylated hemoglobin (Hb A1C)  HYPERTENSION - Stable; no change of medication. Plan: Basic metabolic panel  Hypokalemia - Plan: Basic metabolic panel  Need for hepatitis C screening test - Plan: Hepatitis C antibody   Meds ordered this encounter  Medications  . gabapentin (NEURONTIN) 300 MG capsule    Sig: Take 1 capsule (300 mg total) by mouth 3 (three) times daily.    Dispense:  90 capsule    Refill:  3

## 2014-04-16 NOTE — Progress Notes (Signed)
Quick Note:  Please advise pt regarding following labs...  Blood sugar is elevated but all other labs are normal; potassium is normal. Hepatitis C test is negative. ______

## 2014-06-25 ENCOUNTER — Encounter: Payer: Self-pay | Admitting: Family Medicine

## 2014-06-25 ENCOUNTER — Ambulatory Visit (INDEPENDENT_AMBULATORY_CARE_PROVIDER_SITE_OTHER): Payer: Self-pay | Admitting: Family Medicine

## 2014-06-25 VITALS — BP 132/74 | HR 91 | Temp 97.9°F | Resp 16 | Ht 62.0 in | Wt 170.0 lb

## 2014-06-25 DIAGNOSIS — Z024 Encounter for examination for driving license: Secondary | ICD-10-CM

## 2014-06-25 DIAGNOSIS — IMO0002 Reserved for concepts with insufficient information to code with codable children: Secondary | ICD-10-CM

## 2014-06-25 DIAGNOSIS — Z0289 Encounter for other administrative examinations: Secondary | ICD-10-CM

## 2014-06-25 DIAGNOSIS — E1165 Type 2 diabetes mellitus with hyperglycemia: Secondary | ICD-10-CM

## 2014-06-25 MED ORDER — GLUCOSE BLOOD VI STRP: ORAL_STRIP | Status: DC

## 2014-06-25 NOTE — Progress Notes (Signed)
Subjective:    Patient ID: Teresa Grant, female    DOB: 05-07-1961, 53 y.o.   MRN: [id number]  HPI  This 53 y.o. AA female is here for [facility] physical exam. 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 She aslo needs to sch appt w/ eye care specialist. She wears corrective lenses. Pt has Type II DM, uncontrolled (last A1c= 8.7%) and she admits stress and lack of proper nutrition. Husband recently lost his job after 20 years. Pregnant daughter is stationed in [place] w/ [employer] and her husband is now state-side.  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  Patient Active Problem List   Diagnosis Date Noted  . Type II or unspecified type diabetes mellitus without mention of complication, uncontrolled 09/05/2013  . Renal calculus, right 06/12/2013  . Diabetes with proteinuria 11/05/2012  . DDD (degenerative disc disease), lumbar 06/23/2012  . HYPERLIPIDEMIA 11/27/2006  . HYPERTENSION 11/27/2006    Prior to Admission medications   Medication Sig Start Date End Date Taking? Authorizing Provider  amLODipine (NORVASC) 10 MG tablet Take 1 tablet (10 mg total) by mouth at bedtime. 01/05/14  Yes Heather M Marte, PA-C  glipiZIDE (GLUCOTROL XL) 10 MG 24 hr tablet Take 1 tablet (10 mg total) by mouth daily. 12/11/13  Yes Barton Fanny, MD  Insulin Glargine (LANTUS SOLOSTAR) 100 UNIT/ML Solostar Pen Inject 30 Units into the skin at bedtime. 12/11/13  Yes Barton Fanny, MD  metFORMIN (GLUCOPHAGE) 1000 MG tablet Take 1 tablet (1,000 mg total) by mouth 2 (two) times daily with a meal. 12/11/13  Yes Barton Fanny, MD  Blood Glucose Monitoring Suppl (BLOOD GLUCOSE METER KIT AND SUPPLIES) Test blood sugar 2-3 times daily. Dx code: E11.65. 06/26/14   Barton Fanny, MD  gabapentin (NEURONTIN) 300 MG capsule Take 1 capsule (300 mg total) by mouth 3 (three)  times daily. 04/16/14   Barton Fanny, MD  glucose blood test strip Test 2-3 times daily. Dx code: E11.65. 06/26/14   Barton Fanny, MD    [facility] and FAM Hx reviewed.   Review of Systems  Constitutional: Positive for activity change, appetite change and fatigue. Negative for fever, diaphoresis and unexpected weight change.  Eyes: Positive for redness and visual disturbance.       Wears corrective lenses.  Respiratory: Negative for cough, chest tightness and shortness of breath.   Cardiovascular: Negative.   Gastrointestinal: Negative.   Endocrine: Positive for polyuria. Negative for polydipsia and polyphagia.  Genitourinary: Negative.   Musculoskeletal: Positive for back pain.  Skin: Negative.   Allergic/Immunologic: Positive for environmental allergies.  Neurological: Negative.   Hematological: Negative.   Psychiatric/Behavioral: Positive for sleep disturbance. Negative for behavioral problems, dysphoric mood, decreased concentration and agitation. The patient is nervous/anxious.        Objective:   Physical Exam  Nursing note and vitals reviewed. Constitutional: She is oriented to person, place, and time. Vital signs are normal. She appears well-developed and well-nourished. No distress.  HENT:  Head: Normocephalic and atraumatic.  Right Ear: Hearing, tympanic membrane, external ear and ear canal normal.  Left Ear: Hearing, tympanic membrane, external ear and ear canal normal.  Nose: Mucosal edema present. No nasal deformity or septal deviation.  Mouth/Throat: Uvula is midline and mucous membranes are normal. No oral lesions. Normal dentition. Posterior oropharyngeal erythema present.  Eyes: EOM and lids are normal. Pupils are equal, round, and reactive to light. Right conjunctiva is injected. Left conjunctiva is injected. No scleral icterus.  Neck: Trachea normal, normal range of motion, full passive range of motion without pain and phonation normal. Neck supple. No  spinous process tenderness and no muscular tenderness present. Thyromegaly present. No mass present.  Cardiovascular: Normal rate, regular rhythm, S1 normal, S2 normal, normal heart sounds, intact distal pulses and normal pulses.   No extrasystoles are present. PMI is not displaced.  Exam reveals no gallop and no friction rub.   No murmur heard. Pulmonary/Chest: Effort normal and breath sounds normal. No respiratory distress. She has no decreased breath sounds. Right breast exhibits no inverted nipple, no mass, no nipple discharge, no skin change and no tenderness. Left breast exhibits no inverted nipple, no mass, no nipple discharge, no skin change and no tenderness. Breasts are symmetrical.  Abdominal: Soft. Normal appearance and bowel sounds are normal. She exhibits no distension, no pulsatile midline mass and no mass. There is no hepatosplenomegaly. There is no tenderness. There is no guarding and no CVA tenderness.  Genitourinary:  Deferred.  Musculoskeletal: Normal range of motion. She exhibits no edema.       Cervical back: Normal.       Thoracic back: Normal.       Lumbar back: She exhibits tenderness and spasm.  Well healed post lumbar surgery scar. Remainder of exam unremarkable.  Neurological: She is alert and oriented to person, place, and time. She has normal strength and normal reflexes. She displays no atrophy. No cranial nerve deficit or sensory deficit. She exhibits normal muscle tone. Coordination and gait normal.  Skin: Skin is warm and dry. No rash noted. She is not diaphoretic. No cyanosis or erythema. Nails show no clubbing.  Psychiatric: She has a normal mood and affect. Her behavior is normal. Judgment and thought content normal.       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  Type II diabetes mellitus, uncontrolled - Pt needs new glucometer and supplies for accurate  self-management.  Plan: For home use only DME Glucometer

## 2014-06-25 NOTE — Patient Instructions (Signed)

## 2014-06-26 ENCOUNTER — Encounter: Payer: Self-pay | Admitting: Family Medicine

## 2014-06-26 ENCOUNTER — Other Ambulatory Visit: Payer: Self-pay

## 2014-06-26 MED ORDER — BLOOD GLUCOSE METER KIT: PACK | Status: DC

## 2014-06-26 MED ORDER — GLUCOSE BLOOD VI STRP: ORAL_STRIP | Status: DC

## 2014-07-10 ENCOUNTER — Observation Stay (HOSPITAL_COMMUNITY): Payer: BC Managed Care – PPO

## 2014-07-10 ENCOUNTER — Emergency Department (HOSPITAL_COMMUNITY): Payer: BC Managed Care – PPO

## 2014-07-10 ENCOUNTER — Observation Stay (HOSPITAL_COMMUNITY)
Admission: EM | Admit: 2014-07-10 | Discharge: 2014-07-12 | Disposition: A | Discharge status: Home / Self Care | Payer: BC Managed Care – PPO | Attending: Internal Medicine | Admitting: Internal Medicine

## 2014-07-10 ENCOUNTER — Encounter (HOSPITAL_COMMUNITY): Payer: Self-pay | Admitting: Emergency Medicine

## 2014-07-10 DIAGNOSIS — E119 Type 2 diabetes mellitus without complications: Secondary | ICD-10-CM | POA: Insufficient documentation

## 2014-07-10 DIAGNOSIS — I10 Essential (primary) hypertension: Secondary | ICD-10-CM | POA: Diagnosis not present

## 2014-07-10 DIAGNOSIS — Z87442 Personal history of urinary calculi: Secondary | ICD-10-CM | POA: Insufficient documentation

## 2014-07-10 DIAGNOSIS — IMO0001 Reserved for inherently not codable concepts without codable children: Secondary | ICD-10-CM

## 2014-07-10 DIAGNOSIS — R1031 Right lower quadrant pain: Secondary | ICD-10-CM | POA: Diagnosis present

## 2014-07-10 DIAGNOSIS — Z87891 Personal history of nicotine dependence: Secondary | ICD-10-CM | POA: Insufficient documentation

## 2014-07-10 DIAGNOSIS — E785 Hyperlipidemia, unspecified: Secondary | ICD-10-CM | POA: Diagnosis not present

## 2014-07-10 DIAGNOSIS — Z888 Allergy status to other drugs, medicaments and biological substances status: Secondary | ICD-10-CM | POA: Insufficient documentation

## 2014-07-10 DIAGNOSIS — I209 Angina pectoris, unspecified: Secondary | ICD-10-CM | POA: Diagnosis present

## 2014-07-10 DIAGNOSIS — R079 Chest pain, unspecified: Principal | ICD-10-CM | POA: Insufficient documentation

## 2014-07-10 DIAGNOSIS — K5900 Constipation, unspecified: Secondary | ICD-10-CM | POA: Diagnosis not present

## 2014-07-10 DIAGNOSIS — I208 Other forms of angina pectoris: Secondary | ICD-10-CM | POA: Diagnosis present

## 2014-07-10 DIAGNOSIS — Z794 Long term (current) use of insulin: Secondary | ICD-10-CM | POA: Insufficient documentation

## 2014-07-10 DIAGNOSIS — Z79899 Other long term (current) drug therapy: Secondary | ICD-10-CM | POA: Diagnosis not present

## 2014-07-10 DIAGNOSIS — R109 Unspecified abdominal pain: Secondary | ICD-10-CM | POA: Diagnosis present

## 2014-07-10 DIAGNOSIS — R0789 Other chest pain: Secondary | ICD-10-CM

## 2014-07-10 DIAGNOSIS — M5136 Other intervertebral disc degeneration, lumbar region: Secondary | ICD-10-CM

## 2014-07-10 DIAGNOSIS — K859 Acute pancreatitis without necrosis or infection, unspecified: Secondary | ICD-10-CM

## 2014-07-10 DIAGNOSIS — I2089 Other forms of angina pectoris: Secondary | ICD-10-CM | POA: Diagnosis present

## 2014-07-10 DIAGNOSIS — R1084 Generalized abdominal pain: Secondary | ICD-10-CM

## 2014-07-10 LAB — COMPREHENSIVE METABOLIC PANEL WITH GFR
ALT: 17 U/L (ref 0–35)
AST: 23 U/L (ref 0–37)
Albumin: 3.7 g/dL (ref 3.5–5.2)
Alkaline Phosphatase: 134 U/L — ABNORMAL HIGH (ref 39–117)
Anion gap: 13 (ref 5–15)
BUN: 14 mg/dL (ref 6–23)
CO2: 24 meq/L (ref 19–32)
Calcium: 9.1 mg/dL (ref 8.4–10.5)
Chloride: 99 meq/L (ref 96–112)
Creatinine, Ser: 0.74 mg/dL (ref 0.50–1.10)
GFR calc Af Amer: >90 mL/min
GFR calc non Af Amer: >90 mL/min
Glucose, Bld: 280 mg/dL — ABNORMAL HIGH (ref 70–99)
Potassium: 3.6 meq/L — ABNORMAL LOW (ref 3.7–5.3)
Sodium: 136 meq/L — ABNORMAL LOW (ref 137–147)
Total Bilirubin: 0.3 mg/dL (ref 0.3–1.2)
Total Protein: 7.5 g/dL (ref 6.0–8.3)

## 2014-07-10 LAB — LIPASE, BLOOD: Lipase: 122 U/L — ABNORMAL HIGH (ref 11–59)

## 2014-07-10 LAB — CBC
HCT: 36.2 % (ref 36.0–46.0)
HCT: 36.7 % (ref 36.0–46.0)
Hemoglobin: 12.2 g/dL (ref 12.0–15.0)
Hemoglobin: 12.2 g/dL (ref 12.0–15.0)
MCH: 26.6 pg (ref 26.0–34.0)
MCH: 26.5 pg (ref 26.0–34.0)
MCHC: 33.7 g/dL (ref 30.0–36.0)
MCHC: 33.2 g/dL (ref 30.0–36.0)
MCV: 79 fL (ref 78.0–100.0)
MCV: 79.8 fL (ref 78.0–100.0)
Platelets: 261 K/uL (ref 150–400)
Platelets: 243 10*3/uL (ref 150–400)
RBC: 4.58 MIL/uL (ref 3.87–5.11)
RBC: 4.6 MIL/uL (ref 3.87–5.11)
RDW: 13.7 % (ref 11.5–15.5)
RDW: 13.7 % (ref 11.5–15.5)
WBC: 5.7 K/uL (ref 4.0–10.5)
WBC: 5.7 10*3/uL (ref 4.0–10.5)

## 2014-07-10 LAB — MRSA PCR SCREENING: MRSA by PCR: NEGATIVE

## 2014-07-10 LAB — GLUCOSE, CAPILLARY: Glucose-Capillary: 200 mg/dL — ABNORMAL HIGH (ref 70–99)

## 2014-07-10 LAB — I-STAT TROPONIN, ED: Troponin i, poc: 0.01 ng/mL (ref 0.00–0.08)

## 2014-07-10 MED ORDER — PANTOPRAZOLE SODIUM 40 MG PO TBEC
40.0000 mg | DELAYED_RELEASE_TABLET | Freq: Two times a day (BID) | ORAL | Status: DC
  Administered 2014-07-10 – 2014-07-12 (×5): 40 mg via ORAL
  Filled 2014-07-10 (×8): qty 1

## 2014-07-10 MED ORDER — METFORMIN HCL 500 MG PO TABS
1000.0000 mg | ORAL_TABLET | Freq: Two times a day (BID) | ORAL | Status: DC
  Filled 2014-07-10 (×2): qty 2

## 2014-07-10 MED ORDER — IOHEXOL 300 MG/ML  SOLN
50.0000 mL | Freq: Once | INTRAMUSCULAR | Status: AC | PRN
  Administered 2014-07-10: 50 mL via ORAL

## 2014-07-10 MED ORDER — PANTOPRAZOLE SODIUM 40 MG PO TBEC
40.0000 mg | DELAYED_RELEASE_TABLET | Freq: Once | ORAL | Status: AC
  Administered 2014-07-10: 40 mg via ORAL
  Filled 2014-07-10: qty 1

## 2014-07-10 MED ORDER — GI COCKTAIL ~~LOC~~
30.0000 mL | Freq: Once | ORAL | Status: AC
  Administered 2014-07-10: 30 mL via ORAL
  Filled 2014-07-10: qty 30

## 2014-07-10 MED ORDER — IOHEXOL 300 MG/ML  SOLN
100.0000 mL | Freq: Once | INTRAMUSCULAR | Status: AC | PRN
  Administered 2014-07-10: 100 mL via INTRAVENOUS

## 2014-07-10 MED ORDER — MORPHINE SULFATE 2 MG/ML IJ SOLN: 2.0000 mg | INTRAMUSCULAR | Status: DC | PRN

## 2014-07-10 MED ORDER — SORBITOL 70 % SOLN
30.0000 mL | Freq: Every day | Status: DC | PRN
  Administered 2014-07-10: 30 mL via ORAL
  Filled 2014-07-10 (×2): qty 30

## 2014-07-10 MED ORDER — NITROGLYCERIN 0.4 MG SL SUBL
0.4000 mg | SUBLINGUAL_TABLET | SUBLINGUAL | Status: AC | PRN
  Administered 2014-07-10 (×3): 0.4 mg via SUBLINGUAL
  Filled 2014-07-10: qty 1

## 2014-07-10 MED ORDER — AMLODIPINE BESYLATE 10 MG PO TABS
10.0000 mg | ORAL_TABLET | Freq: Every day | ORAL | Status: DC
  Administered 2014-07-10 – 2014-07-11 (×2): 10 mg via ORAL
  Filled 2014-07-10 (×3): qty 1

## 2014-07-10 MED ORDER — ASPIRIN 81 MG PO CHEW
324.0000 mg | CHEWABLE_TABLET | Freq: Once | ORAL | Status: AC
  Administered 2014-07-10: 324 mg via ORAL
  Filled 2014-07-10: qty 4

## 2014-07-10 MED ORDER — ENOXAPARIN SODIUM 40 MG/0.4ML ~~LOC~~ SOLN
40.0000 mg | SUBCUTANEOUS | Status: DC
  Administered 2014-07-10 – 2014-07-11 (×2): 40 mg via SUBCUTANEOUS
  Filled 2014-07-10 (×3): qty 0.4

## 2014-07-10 MED ORDER — INSULIN GLARGINE 100 UNIT/ML ~~LOC~~ SOLN
30.0000 [IU] | Freq: Every day | SUBCUTANEOUS | Status: DC
  Administered 2014-07-10: 30 [IU] via SUBCUTANEOUS
  Filled 2014-07-10 (×2): qty 0.3

## 2014-07-10 MED ORDER — INFLUENZA VAC SPLIT QUAD 0.5 ML IM SUSY
0.5000 mL | PREFILLED_SYRINGE | INTRAMUSCULAR | Status: AC
  Administered 2014-07-11: 0.5 mL via INTRAMUSCULAR
  Filled 2014-07-10 (×2): qty 0.5

## 2014-07-10 MED ORDER — ACETAMINOPHEN 325 MG PO TABS: 650.0000 mg | ORAL_TABLET | ORAL | Status: DC | PRN

## 2014-07-10 MED ORDER — ONDANSETRON HCL 4 MG/2ML IJ SOLN
4.0000 mg | Freq: Four times a day (QID) | INTRAMUSCULAR | Status: DC | PRN
  Administered 2014-07-11: 4 mg via INTRAVENOUS
  Filled 2014-07-10: qty 2

## 2014-07-10 NOTE — ED Provider Notes (Signed)
CSN: [id number]     Arrival date & time 07/10/14  2263 History   First MD Initiated Contact with Patient 07/10/14 0831     Chief Complaint  Patient presents with  . Chest Pain     (Consider location/radiation/quality/duration/timing/severity/associated sxs/prior Treatment) Patient is a 53 y.o. female presenting with chest pain. The history is provided by the patient.  Chest Pain Pain location:  Substernal area, L chest and L lateral chest Pain quality: burning and pressure   Pain quality: not aching   Pain radiates to:  Upper back (occasionally to back) Radiates to the back: occasionally.   Pain severity:  Moderate Onset quality:  Gradual Timing:  Intermittent Progression:  Worsening Chronicity:  New Context: not at rest and no stress   Relieved by:  Nothing Worsened by:  Nothing tried Associated symptoms: no abdominal pain, no cough, no dizziness, no fever, no nausea, no shortness of breath and not vomiting     Past Medical History  Diagnosis Date  . Hypertension   . Diabetes mellitus   . HLD (hyperlipidemia)   . Pneumonia     7 yrs ago  . Dysrhythmia     1985  . Kidney stone on right side 03/2013   Past Surgical History  Procedure Laterality Date  . Lumbar laminectomy/decompression microdiscectomy  03/20/2012    Procedure: LUMBAR LAMINECTOMY/DECOMPRESSION MICRODISCECTOMY;  Surgeon: Sinclair Ship, MD;  Location: [employer];  Service: Orthopedics;  Laterality: Left;  Left sided lumbar 4-5 microdisectomy  . Spine surgery  2013  . Cesarean section  1990  . Abdominal hysterectomy  2001   Family History  Problem Relation Age of Onset  . Stroke Neg Hx   . Cancer Neg Hx   . Heart disease Mother   . Kidney disease Father    History  Substance Use Topics  . Smoking status: Former Smoker    Quit date: 04/29/2004  . Smokeless tobacco: Not on file  . Alcohol Use: No   OB History   Grav Para Term Preterm Abortions TAB SAB Ect Mult Living                 Review  of Systems  Constitutional: Negative for fever and chills.  Respiratory: Negative for cough and shortness of breath.   Cardiovascular: Positive for chest pain.  Gastrointestinal: Negative for nausea, vomiting and abdominal pain.  Neurological: Negative for dizziness and syncope.  All other systems reviewed and are negative.     Allergies  Lisinopril and Hydrochlorothiazide  Home Medications   Prior to Admission medications   Medication Sig Start Date End Date Taking? Authorizing Provider  amLODipine (NORVASC) 10 MG tablet Take 1 tablet (10 mg total) by mouth at bedtime. 01/05/14   Heather Elnora Morrison, PA-C  Blood Glucose Monitoring Suppl (BLOOD GLUCOSE METER KIT AND SUPPLIES) Test blood sugar 2-3 times daily. Dx code: E11.65. 06/26/14   Barton Fanny, MD  gabapentin (NEURONTIN) 300 MG capsule Take 1 capsule (300 mg total) by mouth 3 (three) times daily. 04/16/14   Barton Fanny, MD  glipiZIDE (GLUCOTROL XL) 10 MG 24 hr tablet Take 1 tablet (10 mg total) by mouth daily. 12/11/13   Barton Fanny, MD  glucose blood test strip Test 2-3 times daily. Dx code: E11.65. 06/26/14   Barton Fanny, MD  Insulin Glargine (LANTUS SOLOSTAR) 100 UNIT/ML Solostar Pen Inject 30 Units into the skin at bedtime. 12/11/13   Barton Fanny, MD  metFORMIN (GLUCOPHAGE) 1000 MG tablet  Take 1 tablet (1,000 mg total) by mouth 2 (two) times daily with a meal. 12/11/13   Barton Fanny, MD   BP 152/86  Pulse 77  Temp(Src) 97.9 F (36.6 C) (Oral)  Resp 16  SpO2 97% Physical Exam  Nursing note and vitals reviewed. Constitutional: She is oriented to person, place, and time. She appears well-developed and well-nourished. No distress.  HENT:  Head: Normocephalic and atraumatic.  Mouth/Throat: Oropharynx is clear and moist.  Eyes: EOM are normal. Pupils are equal, round, and reactive to light.  Neck: Normal range of motion. Neck supple.  Cardiovascular: Normal rate and regular rhythm.  Exam  reveals no friction rub.   No murmur heard. Pulmonary/Chest: Effort normal and breath sounds normal. No respiratory distress. She has no wheezes. She has no rales.  Abdominal: Soft. She exhibits no distension. There is no tenderness. There is no rebound.  Musculoskeletal: Normal range of motion. She exhibits no edema.  Neurological: She is alert and oriented to person, place, and time.  Skin: No rash noted. She is not diaphoretic.    ED Course  Procedures (including critical care time) Labs Review Labs Reviewed  CBC  COMPREHENSIVE METABOLIC PANEL  LIPASE, BLOOD  I-STAT [place], ED    Imaging Review Dg Chest 2 View  07/10/2014   CLINICAL DATA:  Chest pain.  EXAM: CHEST  2 VIEW  COMPARISON:  10/03/2013.  FINDINGS: The heart size and mediastinal contours are within normal limits. Both lungs are clear. The visualized skeletal structures are unremarkable.  IMPRESSION: No active cardiopulmonary disease.   Electronically Signed   By: Marcello Moores  Register   On: 07/10/2014 09:24     EKG Interpretation   Date/Time:  Friday July 10 2014 08:37:14 EDT Ventricular Rate:  72 PR Interval:  142 QRS Duration: 91 QT Interval:  383 QTC Calculation: 419 R Axis:   28 Text Interpretation:  Sinus rhythm Atrial premature complex Similar to  prior Confirmed by Mingo Amber  MD, Bartholomew ([id number]) on 07/10/2014 8:39:13 AM      MDM   Final diagnoses:  Pancreatitis, acute  Abdominal pain  Generalized abdominal pain    89F presents with chest pain. Intermittent, few times a day over the past week. Described as central pressure, sometimes burning. Radiates to lateral chest. Has gotten worse over past 2 weeks. Reports feels like she has to burp but cannot with this pain. Hx of HTN, DM, HLD. Followed by [facility]. AFVSS here. No active CP. EKG same as prior. Exam benign. Cardiac vs GI etiology of this pain. Will check labs, give GI cocktail and PPI. Persistent pain. Labs ok. Patient admitted for [facility]  r/o.    Evelina Bucy, MD 07/10/14 [phone]

## 2014-07-10 NOTE — ED Notes (Signed)
Pt c/o intermittent, sharp, central chest pain x 2 weeks.  Pain score 9/10.  Pt reports increased fatigue.  Sts she was recently diagnosed w/ cardiovascular disease.  Sts she took Alieve x 2 around 0715.

## 2014-07-10 NOTE — ED Notes (Signed)
Report given to Grace,RN.

## 2014-07-10 NOTE — ED Notes (Addendum)
hospitalist at bedside  Pt alert and oriented x4. Respirations even and unlabored, bilateral symmetrical rise and fall of chest. Skin warm and dry. In no acute distress. Denies needs.

## 2014-07-10 NOTE — ED Notes (Signed)
Pt back from x-ray.

## 2014-07-10 NOTE — ED Notes (Addendum)
MD Samtani called and wanted RN to let patient know she would be receiving CT Abdomen. Patient aware of plan of care.

## 2014-07-10 NOTE — ED Notes (Signed)
md at bedside  Pt alert and oriented x4. Respirations even and unlabored, bilateral symmetrical rise and fall of chest. Skin warm and dry. In no acute distress. Denies needs.   

## 2014-07-10 NOTE — H&P (Signed)
[facility] History and Physical  Teresa Grant IOE:[id number] DOB: 08/04/1961 DOA: 07/10/2014  Referring physician: ED PCP: Ellsworth Lennox, MD  Specialists: none  Chief Complaint: Chest pain  HPI: 53 y/o ? known history of moderate controlled diabetes mellitus on insulin, metformin from last A1c 03/2014--8.7, moderate controlled hypertension,Morbid obesity, There is no weight on file to calculate BMI., history of remote dysrhythmia, hyperlipidemia presents to the emergency room with two-week history of chest pain. She states that the pain is sharp pain in the center of his chest with radiation to the back and spasm-like symptoms. This came to ahead and got significantly worse  Saturday third October. She walked to the bathroom, he came to labor down and because of severe abdominal pain decided to come to the hospital at [facility] sleep and felt better. The pain in her chest is more like a bloated feeling which starts as a gassy feeling as if she needs to belch and then become sharp and severe in the central epigastrium. She has been constipated recently which is unusual for her she usually passes stool daily. She has not had significant nausea until 07/08/14 where she had something to drink and regurgitated or vomited back up some of the drink that she had when she bent over. She's not in severe distress but does have generalized abdominal discomfort. She denies fever denies chills denies falls denies weakness denies dizziness denies weakness in any one side of body.  In addition she has not lifted anything extremely heavy or painful  Review of Systems:  Patient states she has some abdominal discomfort more so than chest pain +Distended feeling No fever, no chills,  No blurred or double vision No falls No weakness No falls No cp  Past Medical History  Diagnosis Date  . Hypertension   . Diabetes mellitus   . HLD (hyperlipidemia)   . Pneumonia     7 yrs ago  . Dysrhythmia    1985  . Kidney stone on right side 03/2013   Past Surgical History  Procedure Laterality Date  . Lumbar laminectomy/decompression microdiscectomy  03/20/2012    Procedure: LUMBAR LAMINECTOMY/DECOMPRESSION MICRODISCECTOMY;  Surgeon: Sinclair Ship, MD;  Location: [facility];  Service: Orthopedics;  Laterality: Left;  Left sided lumbar 4-5 microdisectomy  . Spine surgery  2013  . Cesarean section  1990  . Abdominal hysterectomy  2001   Social History:  History   Social History Narrative  . No narrative on file    Allergies  Allergen Reactions  . Lisinopril Cough  . Hydrochlorothiazide Other (See Comments)    pancreatitis    Family History  Problem Relation Age of Onset  . Stroke Neg Hx   . Cancer Neg Hx   . Heart disease Mother   . Kidney disease Father     Prior to Admission medications   Medication Sig Start Date End Date Taking? Authorizing Provider  amLODipine (NORVASC) 10 MG tablet Take 1 tablet (10 mg total) by mouth at bedtime. 01/05/14  Yes Heather Elnora Morrison, PA-C  Blood Glucose Monitoring Suppl (BLOOD GLUCOSE METER KIT AND SUPPLIES) Test blood sugar 2-3 times daily. Dx code: E11.65. 06/26/14  Yes Barton Fanny, MD  glipiZIDE (GLUCOTROL XL) 10 MG 24 hr tablet Take 1 tablet (10 mg total) by mouth daily. 12/11/13  Yes Barton Fanny, MD  glucose blood test strip Test 2-3 times daily. Dx code: E11.65. 06/26/14  Yes Barton Fanny, MD  Insulin Glargine (LANTUS SOLOSTAR) 100 UNIT/ML Solostar Pen  Inject 30 Units into the skin at bedtime. 12/11/13  Yes Barton Fanny, MD  metFORMIN (GLUCOPHAGE) 1000 MG tablet Take 1 tablet (1,000 mg total) by mouth 2 (two) times daily with a meal. 12/11/13  Yes Barton Fanny, MD  Naproxen Sodium (ALEVE) 220 MG CAPS Take 2 capsules by mouth every 8 (eight) hours as needed (pain.).   Yes Historical Provider, MD   Physical Exam: Filed Vitals:   07/10/14 1130 07/10/14 1135 07/10/14 1140 07/10/14 1159  BP: 122/87 108/73  113/75 119/72  Pulse: 80 96 86 70  Temp:      TempSrc:      Resp: _0 SpO2: 95% 93% 94% 96%     General:  EOMI, NCAT anicteric, no pallor  Eyes: see above  ENT: Dentures, mod dentition lower jaw, no caries  Neck: soft neck, NT  Cardiovascular: s1 s2 no m/r/g  Respiratory: clear no added sound  Abdomen: distended.  Tender throughout.  Skin: no le edema  Musculoskeletal:  ROm intact  Psychiatric: intact pleasant  Neurologic:  Cn intact, moved limbs equally  Labs on Admission:  Basic Metabolic Panel:  Recent Labs Lab 07/10/14 0944  NA 136*  K 3.6*  CL 99  CO2 24  GLUCOSE 280*  BUN 14  CREATININE 0.74  CALCIUM 9.1   Liver Function Tests:  Recent Labs Lab 07/10/14 0944  AST 23  ALT 17  ALKPHOS 134*  BILITOT 0.3  PROT 7.5  ALBUMIN 3.7    Recent Labs Lab 07/10/14 0944  LIPASE 122*   No results found for this basename: AMMONIA,  in the last 168 hours CBC:  Recent Labs Lab 07/10/14 0906  WBC 5.7  HGB 12.2  HCT 36.2  MCV 79.0  PLT 261   Cardiac Enzymes: No results found for this basename: CKTOTAL, CKMB, CKMBINDEX, TROPONINI,  in the last 168 hours  BNP (last 3 results) No results found for this basename: PROBNP,  in the last 8760 hours CBG: No results found for this basename: GLUCAP,  in the last 168 hours  Radiological Exams on Admission: Dg Chest 2 View  07/10/2014   CLINICAL DATA:  Chest pain.  EXAM: CHEST  2 VIEW  COMPARISON:  10/03/2013.  FINDINGS: The heart size and mediastinal contours are within normal limits. Both lungs are clear. The visualized skeletal structures are unremarkable.  IMPRESSION: No active cardiopulmonary disease.   Electronically Signed   By: Marcello Moores  Register   On: 07/10/2014 09:24    EKG: Independently reviewed. Nsr, PR interval 0 point 02 no ST-T wave depressions, QRS axis 30  Assessment/Plan Principal Problem:   Abdominal pain etiology unclear. Initially it was felt that she had chest pain  however she does mention predominantly gastrointestinal symptoms. In addition she is bloated and has tenderness on palpation of the abdomen over upper quadrants and lower quadrants and she has not passed stool recently although has been passing rectal stool daily in the past in addition her episodes of regurgitation no nausea concern me from a reflux standpoint and I will start her on Protonix twice a day with a history of taking ibuprofen twice daily this may be the case .   My concern with an elevated lipase is that she may have a pancreatitis and we will get a CT abdomen pelvis and subsequent workup from there Active Problems:   Chest pain-heart score is 4. Her EKG is non-concerning and reported her troponin is negative. I feel her symptoms are  more as above. She may benefit from exercise stress test in the next 1-2 weeks as she presented in 2007 with similar chest pains in never had an ischemic workup. Cycle troponins every 3 hourly and repeat EKG in a.m.   Essential hypertension-continue amlodipine to 10 mg daily each bedtime   DDD (degenerative disc disease), lumbar-currently stable at present time   Diabetes with proteinuria-recent HbA1c 03/2014 he went 7. For now continue Lantus 30 units each bedtime and metformin twice a day. Hold off on Amaryl for now. Can have diabetic heart healthy diet  Time spent: 13   no family at bedside Admit to telemetry  Verlon Au [facility] Pager [phone]  If 7PM-7AM, please contact night-coverage [email or web address] Password [email or web address] 07/10/2014, 12:04 PM

## 2014-07-10 NOTE — Progress Notes (Signed)
Inpatient Diabetes Program Recommendations  AACE/ADA: New Consensus Statement on Inpatient Glycemic Control (2013)  Target Ranges:  Prepandial:   less than 140 mg/dL      Peak postprandial:   less than 180 mg/dL (1-2 hours)      Critically ill patients:  140 - 180 mg/dL     Results for LOREN, VICENS (MRN [id number]) as of 07/10/2014 14:12  Ref. Range 07/10/2014 09:44  Glucose Latest Range: 70-99 mg/dL 280 (H)     Admitted with Abd pain/ CP.  History of DM, HTN.  Home DM Meds: Lantus 30 units QHS       Glipizide 10 mg daily       Metformin 1000 mg bid  Current Meds: Lantus 30 units QHS (start tonight)   Metformin 1000 mg bid     MD- Please add order for Novolog Moderate SSI    Will follow Wyn Quaker RN, MSN, CDE Diabetes Coordinator Inpatient Diabetes Program Team Pager: [phone] (8a-10p)

## 2014-07-11 DIAGNOSIS — R1084 Generalized abdominal pain: Secondary | ICD-10-CM

## 2014-07-11 DIAGNOSIS — E119 Type 2 diabetes mellitus without complications: Secondary | ICD-10-CM | POA: Diagnosis not present

## 2014-07-11 DIAGNOSIS — R079 Chest pain, unspecified: Secondary | ICD-10-CM | POA: Diagnosis not present

## 2014-07-11 DIAGNOSIS — M5136 Other intervertebral disc degeneration, lumbar region: Secondary | ICD-10-CM

## 2014-07-11 DIAGNOSIS — I369 Nonrheumatic tricuspid valve disorder, unspecified: Secondary | ICD-10-CM

## 2014-07-11 DIAGNOSIS — R0789 Other chest pain: Secondary | ICD-10-CM

## 2014-07-11 DIAGNOSIS — I10 Essential (primary) hypertension: Secondary | ICD-10-CM

## 2014-07-11 DIAGNOSIS — K5900 Constipation, unspecified: Secondary | ICD-10-CM | POA: Diagnosis not present

## 2014-07-11 LAB — GLUCOSE, CAPILLARY
Glucose-Capillary: 389 mg/dL — ABNORMAL HIGH (ref 70–99)
Glucose-Capillary: 439 mg/dL — ABNORMAL HIGH (ref 70–99)
Glucose-Capillary: 337 mg/dL — ABNORMAL HIGH (ref 70–99)
Glucose-Capillary: 87 mg/dL (ref 70–99)

## 2014-07-11 LAB — TROPONIN I
Troponin I: <0.3 ng/mL (ref <0.30)
Troponin I: <0.3 ng/mL (ref <0.30)

## 2014-07-11 MED ORDER — SORBITOL 70 % SOLN
960.0000 mL | TOPICAL_OIL | Freq: Once | ORAL | Status: AC
  Administered 2014-07-11: 960 mL via RECTAL
  Filled 2014-07-11: qty 240

## 2014-07-11 MED ORDER — INSULIN ASPART 100 UNIT/ML ~~LOC~~ SOLN
5.0000 [IU] | Freq: Three times a day (TID) | SUBCUTANEOUS | Status: DC
  Administered 2014-07-12: 5 [IU] via SUBCUTANEOUS

## 2014-07-11 MED ORDER — INSULIN ASPART 100 UNIT/ML ~~LOC~~ SOLN
0.0000 [IU] | Freq: Three times a day (TID) | SUBCUTANEOUS | Status: DC
  Administered 2014-07-11: 15 [IU] via SUBCUTANEOUS

## 2014-07-11 MED ORDER — INSULIN GLARGINE 100 UNIT/ML ~~LOC~~ SOLN
35.0000 [IU] | Freq: Every day | SUBCUTANEOUS | Status: DC
  Administered 2014-07-11: 35 [IU] via SUBCUTANEOUS
  Filled 2014-07-11: qty 0.35

## 2014-07-11 MED ORDER — INSULIN ASPART 100 UNIT/ML ~~LOC~~ SOLN
0.0000 [IU] | Freq: Three times a day (TID) | SUBCUTANEOUS | Status: DC
  Administered 2014-07-12: 11 [IU] via SUBCUTANEOUS

## 2014-07-11 MED ORDER — INSULIN ASPART 100 UNIT/ML ~~LOC~~ SOLN
10.0000 [IU] | Freq: Once | SUBCUTANEOUS | Status: AC
  Administered 2014-07-11: 10 [IU] via SUBCUTANEOUS

## 2014-07-11 MED ORDER — MAGNESIUM CITRATE PO SOLN
1.0000 | Freq: Once | ORAL | Status: AC
  Administered 2014-07-11: 1 via ORAL

## 2014-07-11 MED ORDER — INSULIN ASPART 100 UNIT/ML ~~LOC~~ SOLN: 0.0000 [IU] | Freq: Every day | SUBCUTANEOUS | Status: DC

## 2014-07-11 NOTE — Progress Notes (Signed)
[facility] PROGRESS NOTE  Teresa LASSETER Grant:[id number] DOB: 12-02-1960 DOA: 07/10/2014 PCP: Ellsworth Lennox, MD  Assessment/Plan: 1. Abd pain likely secondary to constipation 1. CT abd personally reviewed 2. Large amount of stool seen throughout entire colon 3. Cathartics ordered 4. Thus far, multiple large BM noted 5. Cont regimen as tolerated 2. Chest pain 1. 2d echo done, awaiting results 2. Currently chest pain free 3. Follow serial enzymes 3. HTN 1. BP stable and controlled 4. DDD 1. Stable 5. DM 2 1. Glucose poorly controlled 2. SSI coverage added 3. a1c of 8.7 6. DVT prophylaxis 1. Heparin subQ  Code Status: Full Family Communication: Pt in room  Disposition Plan: Pending   Consultants:  none  Procedures:  none  Antibiotics:  none (indicate start date, and stop date if known)  HPI/Subjective: Feels better today. No acute events noted overnight  Objective: Filed Vitals:   07/10/14 1251 07/10/14 2123 07/11/14 0623 07/11/14 1411  BP: 109/73 139/84 129/74 120/75  Pulse: 64 70 64 79  Temp: 97.2 F (36.2 C) 99 F (37.2 C) 98.2 F (36.8 C) 98.2 F (36.8 C)  TempSrc: Oral Oral Oral Oral  Resp:  20 20 18   Height: 5\' 1"  (1.549 m)     Weight: 78.586 kg (173 lb 4 oz)     SpO2: 99% 100% 100% 99%    Intake/Output Summary (Last 24 hours) at 07/11/14 1633 Last data filed at 07/11/14 1300  Gross per 24 hour  Intake    960 ml  Output      0 ml  Net    960 ml   Filed Weights   07/10/14 1251  Weight: 78.586 kg (173 lb 4 oz)    Exam:   General:  Awake, in nad  Cardiovascular: regular, s1, s2  Respiratory: normal resp effort, no wheezing  Abdomen: soft, pos BS, mildly distended, nad  Musculoskeletal: perfused, no clubbing   Data Reviewed: Basic Metabolic Panel:  Recent Labs Lab 07/10/14 0944  NA 136*  K 3.6*  CL 99  CO2 24  GLUCOSE 280*  BUN 14  CREATININE 0.74  CALCIUM 9.1   Liver Function Tests:  Recent  Labs Lab 07/10/14 0944  AST 23  ALT 17  ALKPHOS 134*  BILITOT 0.3  PROT 7.5  ALBUMIN 3.7    Recent Labs Lab 07/10/14 0944  LIPASE 122*   No results found for this basename: AMMONIA,  in the last 168 hours CBC:  Recent Labs Lab 07/10/14 0906 07/10/14 0944  WBC 5.7 5.7  HGB 12.2 12.2  HCT 36.2 36.7  MCV 79.0 79.8  PLT 261 243   Cardiac Enzymes: No results found for this basename: CKTOTAL, CKMB, CKMBINDEX, TROPONINI,  in the last 168 hours BNP (last 3 results) No results found for this basename: PROBNP,  in the last 8760 hours CBG:  Recent Labs Lab 07/10/14 2132 07/11/14 1425  GLUCAP 200* 389*    Recent Results (from the past 240 hour(s))  MRSA PCR SCREENING     Status: None   Collection Time    07/10/14  6:43 PM      Result Value Ref Range Status   MRSA by PCR NEGATIVE  NEGATIVE Final   Comment:            The GeneXpert MRSA Assay (FDA     approved for NASAL specimens     only), is one component of a     comprehensive MRSA colonization     surveillance program. It  is not     intended to diagnose MRSA     infection nor to guide or     monitor treatment for     MRSA infections.     Studies: Dg Chest 2 View  07/10/2014   CLINICAL DATA:  Chest pain.  EXAM: CHEST  2 VIEW  COMPARISON:  10/03/2013.  FINDINGS: The heart size and mediastinal contours are within normal limits. Both lungs are clear. The visualized skeletal structures are unremarkable.  IMPRESSION: No active cardiopulmonary disease.   Electronically Signed   By: Marcello Moores  Register   On: 07/10/2014 09:24   Ct Abdomen W Contrast  07/10/2014   CLINICAL DATA:  Epigastric pain x3 days, nausea  EXAM: CT ABDOMEN WITH CONTRAST  TECHNIQUE: Multidetector CT imaging of the abdomen was performed using the standard protocol following bolus administration of intravenous contrast.  CONTRAST:  130mL OMNIPAQUE IOHEXOL 300 MG/ML  SOLN  COMPARISON:  None.  FINDINGS: Lower chest: Compressive atelectasis in the medial  right lower lobe.  Hepatobiliary: Liver is within normal limits.  Gallbladder is unremarkable. No intrahepatic or extrahepatic ductal dilatation.  Pancreas: Within normal limits.  Spleen: Within normal limits.  Adrenals/Urinary Tract: Adrenal glands are unremarkable.  7 mm cyst in the posterior interpolar left kidney (series 2/image 37). 3 mm calculus in the right lower kidney (series 2/ image 43). No hydronephrosis.  Stomach/Bowel: Stomach is notable for food/debris.  Visualized bowel is unremarkable.  Vascular/Lymphatic: No evidence of abdominal aortic aneurysm.  No suspicious abdominal lymphadenopathy.  Other: No abdominal ascites.  Musculoskeletal: Mild degenerative changes of the lower thoracic spine.  IMPRESSION: 3 mm nonobstructing calculus in the right lower kidney. No hydronephrosis.  Otherwise unremarkable CT abdomen.  Please note that the pelvis was not imaged.   Electronically Signed   By: Julian Hy M.D.   On: 07/10/2014 18:05    Scheduled Meds: . amLODipine  10 mg Oral QHS  . enoxaparin (LOVENOX) injection  40 mg Subcutaneous Q24H  . insulin aspart  0-15 Units Subcutaneous TID WC  . insulin aspart  0-5 Units Subcutaneous QHS  . insulin glargine  30 Units Subcutaneous QHS  . [START ON 07/12/2014] metFORMIN  1,000 mg Oral BID WC  . pantoprazole  40 mg Oral BID   Continuous Infusions:   Principal Problem:   Abdominal pain Active Problems:   Essential hypertension   DDD (degenerative disc disease), lumbar   Diabetes with proteinuria   Chest pain  Time spent: 17min  [facility], [facility] Pager [phone]. If 7PM-7AM, please contact night-coverage at [email or web address], password [facility] 07/11/2014, 4:33 PM  LOS: 1 day

## 2014-07-11 NOTE — Progress Notes (Signed)
UR completed 

## 2014-07-11 NOTE — Progress Notes (Signed)
  Echocardiogram 2D Echocardiogram has been performed.  Teresa Grant 07/11/2014, 3:51 PM

## 2014-07-11 NOTE — Progress Notes (Signed)
Patient's CBG-439 no symptom noted, notified Dr Wyline Copas SSI changed and additional insuline given, rechecked CBG-337. Will continue to assess patient.

## 2014-07-12 DIAGNOSIS — E119 Type 2 diabetes mellitus without complications: Secondary | ICD-10-CM

## 2014-07-12 DIAGNOSIS — R809 Proteinuria, unspecified: Secondary | ICD-10-CM

## 2014-07-12 LAB — GLUCOSE, CAPILLARY
Glucose-Capillary: 277 mg/dL — ABNORMAL HIGH (ref 70–99)
Glucose-Capillary: 212 mg/dL — ABNORMAL HIGH (ref 70–99)

## 2014-07-12 LAB — TROPONIN I: Troponin I: <0.3 ng/mL (ref <0.30)

## 2014-07-12 NOTE — Progress Notes (Signed)
Patient had 6 beats of v.tach, patient is asymptomatic while resting in bed. We will continue to monitor patient.

## 2014-07-12 NOTE — Discharge Instructions (Signed)
Heart Disease Prevention Heart disease can lead to heart attacks and strokes. This is a leading cause of death. Heart disease can be inherited and can be caused from the lifestyle you lead. You can do a lot to keep your heart and blood vessels healthy.  WHAT SHOULD I DO EACH DAY TO KEEP MY HEART HEALTHY?  Do not smoke.  Follow a healthy eating plan as recommended by your caregiver or dietitian.  Be active for a total of 30 minutes most days. Ask your caregiver what activities are best for you.  Limit the amount of alcohol you drink.  Involve family and friends to help you with a healthy lifestyle. HOW DOES HEART DISEASE CAUSE HIGH BLOOD PRESSURE?  Narrowed blood vessels leave a smaller opening for blood to flow through. It is like turning on a garden hose and holding your thumb over the opening. The smaller opening makes the water shoot out with more pressure. In the same way, narrowed blood vessels can lead to high blood pressure. Other factors, such as kidney problems and being overweight, also can lead to high blood pressure.  If you have high blood pressure you may need to take blood pressure medicine every day. Some types of blood pressure medicine can also help keep your kidneys healthy.  Many people with diabetes also have high blood pressure. If you have heart, eye, or kidney problems from diabetes, high blood pressure can make them worse. HOW DO MY BLOOD VESSELS GET CLOGGED?  Cholesterol is a substance that is made by the body and used for many important functions. It is also found in food that comes from animals. When your cholesterol is high, it can stick to the insides of your blood vessels, making them narrowed and even clogged. This problem is called atherosclerosis.  Narrowed and clogged blood vessels make it harder for blood to get to important body organs. This can cause problems such as:  Chest pain (angina). Angina can cause temporary pain in your chest, arms, shoulders,  or back. You may feel the pain more when your heart beats faster, such as when you exercise. The pain may go away when you rest. You also may feel very weak and sweaty.  A heart attack. A heart attack happens when a blood vessel in or near the heart becomes blocked. Not enough blood is getting to the heart. During a heart attack, you may have chest pain in your chest, arms, shoulders, or back along with nausea, indigestion, extreme weakness, and sweating. WHAT CAN I DO TO PREVENT HEART DISEASE?   Keep your blood pressure under control as recommended by your caregiver.  Keep your cholesterol under control. Have it checked at least once a year. Target cholesterol levels for most people are:  Total blood cholesterol level: Below 200.  LDL (bad) cholesterol: Below 100.  HDL (good) cholesterol: Above 40 in men and above 50 in women.  Triglycerides (another type of fat in the blood): Below 150.  Make physical activity a part of your daily routine. Check with your caregiver to learn what activities are best for you.  Make sure that the foods you eat are "heart-healthy."  Include foods high in fiber, such as oat bran, oatmeal, whole-grain breads and cereals.  Cut back on fried foods and foods high in saturated fat. This includes foods such as meats, butter, whole dairy products, shortening, and coconut or palm oil.  Avoid salty foods such as canned food, luncheon meat, salty snacks, and fast food.    Eat more fruits and vegetables.  Drink less alcohol.  Lose weight as recommended by your caregiver.  If you smoke, quit. Your caregiver can help you with quitting options.  Ask your caregiver whether you should take a daily aspirin. Studies have shown that taking aspirin can help reduce your risk of heart disease and stroke.  Take your prescribed medicines as directed. WHAT ARE THE WARNING SIGNS OF A HEART ATTACK? You may have one or more of the following warning signs:  Chest pain or  discomfort.  Pain or discomfort in your arms, back, jaw, or neck.  Indigestion or stomach pain.  Shortness of breath.  Sweating.  Nausea or vomiting.  Lightheadedness.  No warning signs at all or they may come and go. FOR MORE INFORMATION  To find out more about heart disease and stroke prevention, visit the American Heart Association website at www.americanheart.org Document Released: 04/25/2004 Document Revised: 03/12/2012 Document Reviewed: 11/05/2013 Exit[employer] Patient Information 2015 [employer]. This information is not intended to replace advice given to you by your health care provider. Make sure you discuss any questions you have with your health care provider.  

## 2014-07-12 NOTE — Progress Notes (Signed)
D/C instructions reviewed w/ pt and family. All verbalize understanding and all questions answered. Pt d/c in w/c in stable condition to family's car. Pt in possession of d/c instructions and all personal belongings.

## 2014-07-12 NOTE — Progress Notes (Signed)
UR completed 

## 2014-07-12 NOTE — Progress Notes (Signed)
Patient had a very small amount of bright red output from her rectum when she used the bathroom, no complaints of pain on her rectum. We will continue to monitor her.

## 2014-07-12 NOTE — Discharge Summary (Signed)
Physician Discharge Summary  TYNE BANTA UPJ:[id number] DOB: 1960/12/07 DOA: 07/10/2014  PCP: Ellsworth Lennox, MD  Admit date: 07/10/2014 Discharge date: 07/12/2014  Time spent: 35 minutes  Recommendations for Outpatient Follow-up:  1. Follow up with PCP in 1-2 weeks 2. May consider outpatient stress testing 3. Please address compliance with diabetic diet as pt's family reports pt usual intake normally involves concentrated sugary foods  Discharge Diagnoses:  Principal Problem:   Abdominal pain Active Problems:   Essential hypertension   DDD (degenerative disc disease), lumbar   Diabetes with proteinuria   Chest pain   Discharge Condition: Improved  Diet recommendation: Diabetic, heart healthy  Filed Weights   07/10/14 1251  Weight: 78.586 kg (173 lb 4 oz)    History of present illness:  Please see admit h and p from 10/16 for details. Briefly, pt presents with complaints of chest pain described as "bloating." Given risk factors, the patient was admitted for further work up.  Hospital Course:  1. Abd pain likely secondary to constipation  1. CT abd was personally reviewed 2. Large amount of stool seen throughout entire colon 3. Cathartics were ordered with multiple large BM noted 2. Chest pain  1. 2d echo done, normal EF 2. Chest pain resolved 3. Trop neg x 3 4. Pt again described chest discomfort as "bloating" with sx suggestive of reflux 5. Given cardiac risk factors, may benefit from outpatient stress test 3. HTN  1. BP stable and controlled 4. DDD  1. Stable 5. DM 2  1. SSI coverage added 2. a1c of 8.7 3. Family reports pt is very noncompliant with diet and routinely eats bags of candy, drinks regular sodas, eating cakes, etc. Pt would benefit from diabetic coordinator as an outpatient. Pt is made aware that poorly controlled diabetes is the leading cause of ESRD and that if glucose not improved, she may ultimately end up on dialysis 6. DVT  prophylaxis  1. Heparin subQ  Consultations:  none  Discharge Exam: Filed Vitals:   07/11/14 0623 07/11/14 1411 07/11/14 2125 07/12/14 0633  BP: 129/74 120/75 118/68 131/85  Pulse: 64 79 71 82  Temp: 98.2 F (36.8 C) 98.2 F (36.8 C) 98 F (36.7 C) 99.3 F (37.4 C)  TempSrc: Oral Oral Oral Oral  Resp: _0 Height:      Weight:      SpO2: 100% 99% 97% 94%   General: Awake, in nad Cardiovascular: regular, s1, s2 Respiratory: normal resp effort, no wheezing  Discharge Instructions    Medication List         ALEVE 220 MG Caps  Generic drug:  Naproxen Sodium  Take 2 capsules by mouth every 8 (eight) hours as needed (pain.).     amLODipine 10 MG tablet  Commonly known as:  NORVASC  Take 1 tablet (10 mg total) by mouth at bedtime.     blood glucose meter kit and supplies  Test blood sugar 2-3 times daily. Dx code: E11.65.     glipiZIDE 10 MG 24 hr tablet  Commonly known as:  GLUCOTROL XL  Take 1 tablet (10 mg total) by mouth daily.     glucose blood test strip  Test 2-3 times daily. Dx code: E11.65.     Insulin Glargine 100 UNIT/ML Solostar Pen  Commonly known as:  LANTUS SOLOSTAR  Inject 30 Units into the skin at bedtime.     metFORMIN 1000 MG tablet  Commonly known as:  GLUCOPHAGE  Take 1 tablet (  1,000 mg total) by mouth 2 (two) times daily with a meal.       Allergies  Allergen Reactions  . Lisinopril Cough  . Hydrochlorothiazide Other (See Comments)    pancreatitis   Follow-up Information   Follow up with [facility], [place]. Schedule an appointment as soon as possible for a visit in 1 week.   Specialty:  Family Medicine   Contact information:   [place] [address] [phone]        The results of significant diagnostics from this hospitalization (including imaging, microbiology, ancillary and laboratory) are listed below for reference.    Significant Diagnostic Studies: Dg Chest 2 View  07/10/2014    CLINICAL DATA:  Chest pain.  EXAM: CHEST  2 VIEW  COMPARISON:  10/03/2013.  FINDINGS: The heart size and mediastinal contours are within normal limits. Both lungs are clear. The visualized skeletal structures are unremarkable.  IMPRESSION: No active cardiopulmonary disease.   Electronically Signed   By: Marcello Moores  Register   On: 07/10/2014 09:24   Ct Abdomen W Contrast  07/10/2014   CLINICAL DATA:  Epigastric pain x3 days, nausea  EXAM: CT ABDOMEN WITH CONTRAST  TECHNIQUE: Multidetector CT imaging of the abdomen was performed using the standard protocol following bolus administration of intravenous contrast.  CONTRAST:  122m OMNIPAQUE IOHEXOL 300 MG/ML  SOLN  COMPARISON:  None.  FINDINGS: Lower chest: Compressive atelectasis in the medial right lower lobe.  Hepatobiliary: Liver is within normal limits.  Gallbladder is unremarkable. No intrahepatic or extrahepatic ductal dilatation.  Pancreas: Within normal limits.  Spleen: Within normal limits.  Adrenals/Urinary Tract: Adrenal glands are unremarkable.  7 mm cyst in the posterior interpolar left kidney (series 2/image 37). 3 mm calculus in the right lower kidney (series 2/ image 43). No hydronephrosis.  Stomach/Bowel: Stomach is notable for food/debris.  Visualized bowel is unremarkable.  Vascular/Lymphatic: No evidence of abdominal aortic aneurysm.  No suspicious abdominal lymphadenopathy.  Other: No abdominal ascites.  Musculoskeletal: Mild degenerative changes of the lower thoracic spine.  IMPRESSION: 3 mm nonobstructing calculus in the right lower kidney. No hydronephrosis.  Otherwise unremarkable CT abdomen.  Please note that the pelvis was not imaged.   Electronically Signed   By: SJulian HyM.D.   On: 07/10/2014 18:05   Microbiology: Recent Results (from the past 240 hour(s))  MRSA PCR SCREENING     Status: None   Collection Time    07/10/14  6:43 PM      Result Value Ref Range Status   MRSA by PCR NEGATIVE  NEGATIVE Final   Comment:             The GeneXpert MRSA Assay (FDA     approved for NASAL specimens     only), is one component of a     comprehensive MRSA colonization     surveillance program. It is not     intended to diagnose MRSA     infection nor to guide or     monitor treatment for     MRSA infections.    Labs: Basic Metabolic Panel:  Recent Labs Lab 07/10/14 0944  NA 136*  K 3.6*  CL 99  CO2 24  GLUCOSE 280*  BUN 14  CREATININE 0.74  CALCIUM 9.1   Liver Function Tests:  Recent Labs Lab 07/10/14 0944  AST 23  ALT 17  ALKPHOS 134*  BILITOT 0.3  PROT 7.5  ALBUMIN 3.7    Recent Labs Lab 07/10/14 0944  LIPASE 122*   No results found for this basename: AMMONIA,  in the last 168 hours CBC:  Recent Labs Lab 07/10/14 0906 07/10/14 0944  WBC 5.7 5.7  HGB 12.2 12.2  HCT 36.2 36.7  MCV 79.0 79.8  PLT 261 243   Cardiac Enzymes:  Recent Labs Lab 07/11/14 1649 07/11/14 2315 07/12/14 0446  TROPONINI <0.30 <0.30 <0.30   BNP: BNP (last 3 results) No results found for this basename: PROBNP,  in the last 8760 hours CBG:  Recent Labs Lab 07/11/14 1425 07/11/14 1709 07/11/14 1847 07/11/14 2118 07/12/14 0809  GLUCAP 389* 439* 337* 87 277*    Signed:  CHIU, STEPHEN K  [facility] 07/12/2014, 12:29 PM

## 2014-07-13 LAB — GLUCOSE, CAPILLARY: Glucose-Capillary: 135 mg/dL — ABNORMAL HIGH (ref 70–99)

## 2014-07-16 ENCOUNTER — Ambulatory Visit: Payer: BC Managed Care – PPO | Admitting: Family Medicine

## 2014-12-16 ENCOUNTER — Other Ambulatory Visit: Payer: Self-pay | Admitting: Family Medicine

## 2015-01-08 ENCOUNTER — Other Ambulatory Visit: Payer: Self-pay | Admitting: Family Medicine

## 2015-01-11 IMAGING — CR DG ABDOMEN 1V
1 series · 1 of 1 positions shown · non-contrast
Comparison: CT scan of the abdomen dated 11/24/2009

CLINICAL DATA: Stone.  Pre lithotripsy.

ABDOMEN - 1 VIEW

[t abdomen supine]
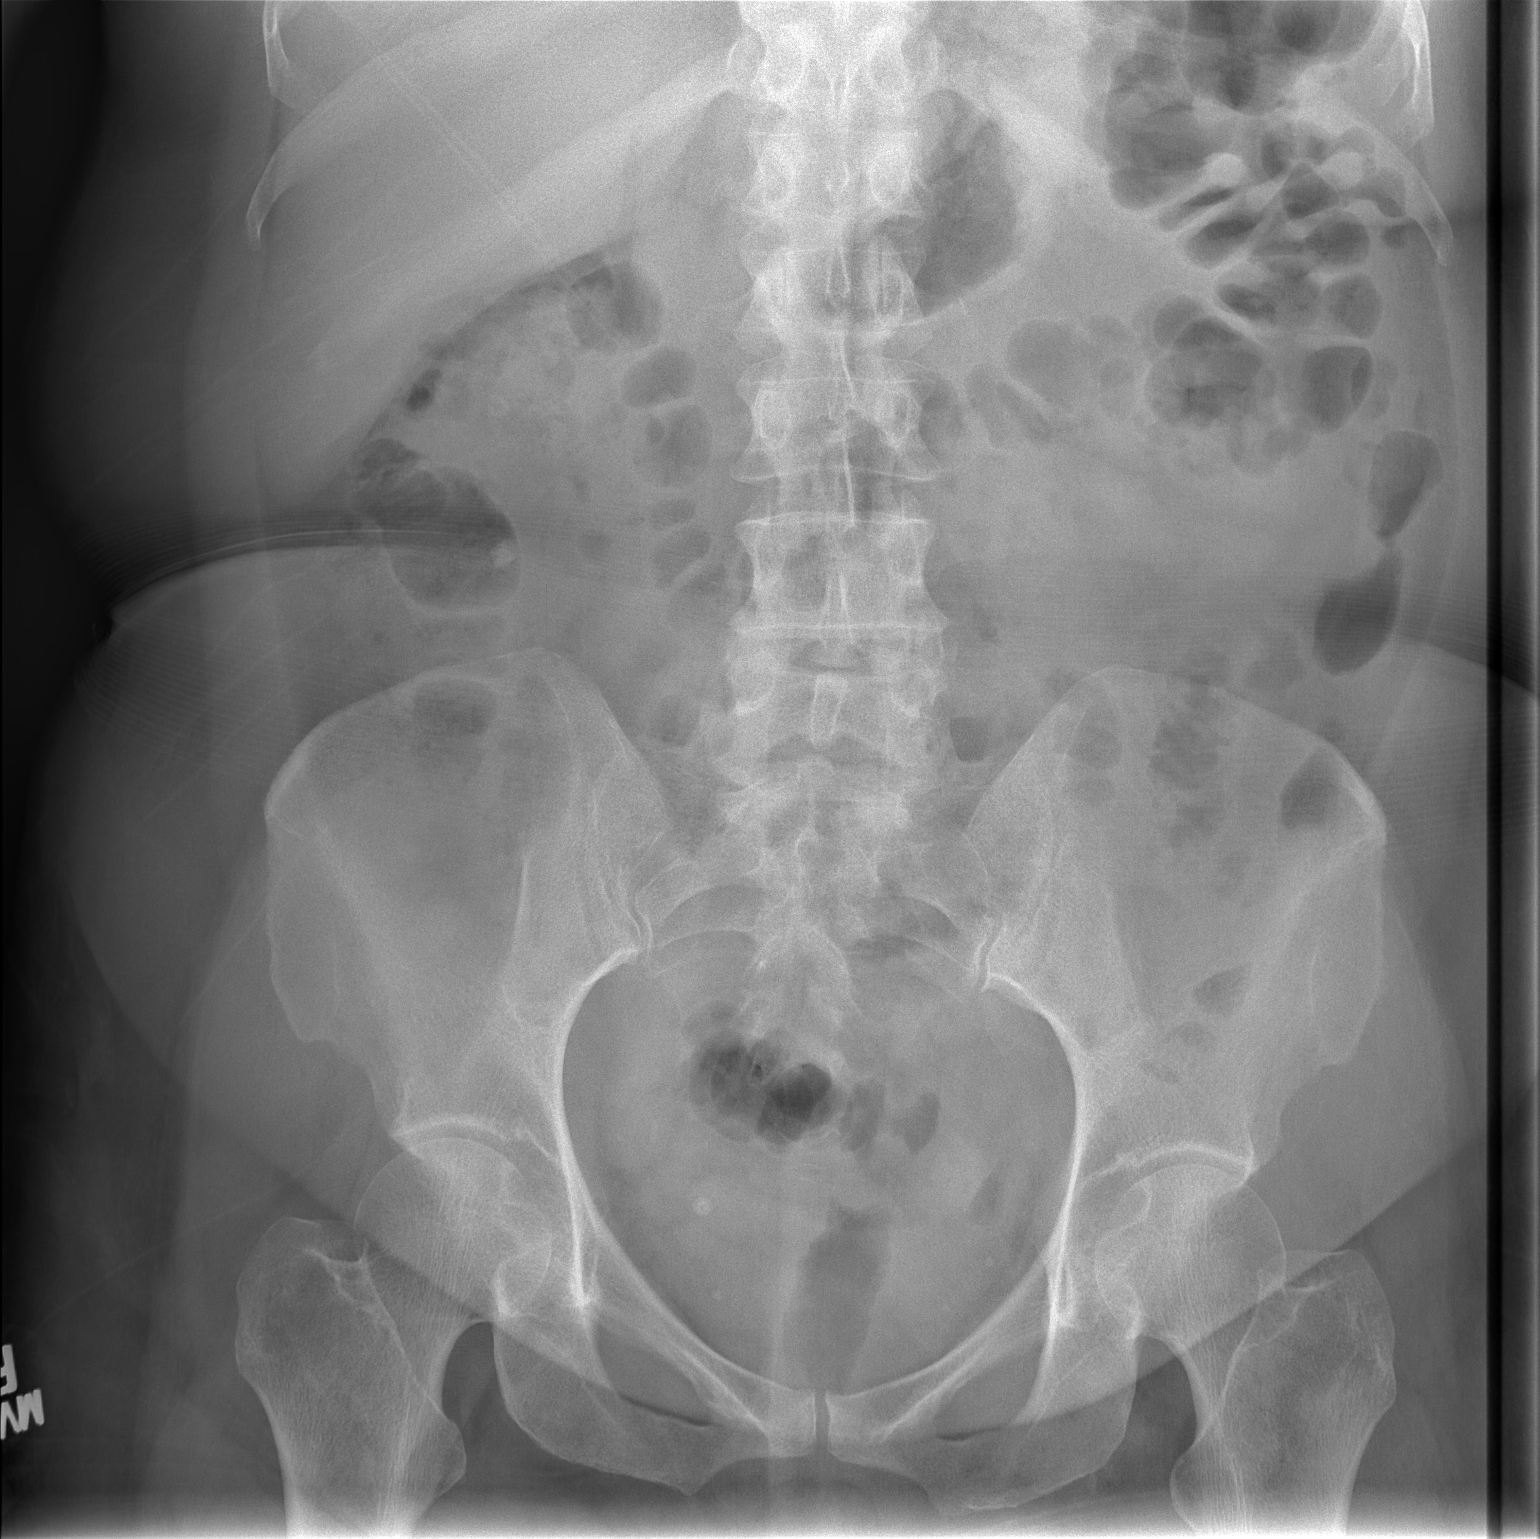

[1 of 1 positions shown; findings below may reference images not displayed]

FINDINGS: There is a 8 mm stone in the lower pole of the right
kidney.  There are a few phleboliths in the pelvis.

Bowel gas pattern is normal. No osseous abnormality.

There may be a 5 mm stone in the mid right kidney but this is not
definitive.
IMPRESSION: 8 mm stone in the lower pole of the right kidney.  Possible 5 mm
stone in the mid right kidney.

## 2015-01-19 ENCOUNTER — Other Ambulatory Visit: Payer: Self-pay | Admitting: Physician Assistant

## 2015-03-01 ENCOUNTER — Encounter: Payer: Self-pay | Admitting: *Deleted

## 2015-04-30 ENCOUNTER — Encounter: Payer: Self-pay | Admitting: *Deleted

## 2015-06-17 ENCOUNTER — Emergency Department (HOSPITAL_COMMUNITY): Payer: BC Managed Care – PPO

## 2015-06-17 ENCOUNTER — Encounter (HOSPITAL_COMMUNITY): Payer: Self-pay | Admitting: Emergency Medicine

## 2015-06-17 ENCOUNTER — Emergency Department (HOSPITAL_COMMUNITY)
Admission: EM | Admit: 2015-06-17 | Discharge: 2015-06-17 | Disposition: A | Discharge status: Home / Self Care | Payer: BC Managed Care – PPO | Attending: Emergency Medicine | Admitting: Emergency Medicine

## 2015-06-17 DIAGNOSIS — E785 Hyperlipidemia, unspecified: Secondary | ICD-10-CM | POA: Insufficient documentation

## 2015-06-17 DIAGNOSIS — M545 Low back pain, unspecified: Secondary | ICD-10-CM

## 2015-06-17 DIAGNOSIS — Z8701 Personal history of pneumonia (recurrent): Secondary | ICD-10-CM | POA: Diagnosis not present

## 2015-06-17 DIAGNOSIS — Z794 Long term (current) use of insulin: Secondary | ICD-10-CM | POA: Diagnosis not present

## 2015-06-17 DIAGNOSIS — I10 Essential (primary) hypertension: Secondary | ICD-10-CM | POA: Diagnosis not present

## 2015-06-17 DIAGNOSIS — N200 Calculus of kidney: Secondary | ICD-10-CM

## 2015-06-17 DIAGNOSIS — R42 Dizziness and giddiness: Secondary | ICD-10-CM | POA: Diagnosis not present

## 2015-06-17 DIAGNOSIS — Z87891 Personal history of nicotine dependence: Secondary | ICD-10-CM | POA: Diagnosis not present

## 2015-06-17 DIAGNOSIS — R300 Dysuria: Secondary | ICD-10-CM | POA: Diagnosis present

## 2015-06-17 DIAGNOSIS — R358 Other polyuria: Secondary | ICD-10-CM | POA: Insufficient documentation

## 2015-06-17 DIAGNOSIS — Z79899 Other long term (current) drug therapy: Secondary | ICD-10-CM | POA: Insufficient documentation

## 2015-06-17 DIAGNOSIS — E119 Type 2 diabetes mellitus without complications: Secondary | ICD-10-CM | POA: Diagnosis not present

## 2015-06-17 LAB — COMPREHENSIVE METABOLIC PANEL
ALT: 15 U/L (ref 14–54)
AST: 23 U/L (ref 15–41)
Albumin: 3.4 g/dL — ABNORMAL LOW (ref 3.5–5.0)
Alkaline Phosphatase: 104 U/L (ref 38–126)
Anion gap: 7 (ref 5–15)
BUN: 10 mg/dL (ref 6–20)
CO2: 25 mmol/L (ref 22–32)
Calcium: 8.4 mg/dL — ABNORMAL LOW (ref 8.9–10.3)
Chloride: 106 mmol/L (ref 101–111)
Creatinine, Ser: 0.82 mg/dL (ref 0.44–1.00)
GFR calc Af Amer: >60 mL/min (ref >60)
GFR calc non Af Amer: >60 mL/min (ref >60)
Glucose, Bld: 266 mg/dL — ABNORMAL HIGH (ref 65–99)
Potassium: 3.5 mmol/L (ref 3.5–5.1)
Sodium: 138 mmol/L (ref 135–145)
Total Bilirubin: 0.5 mg/dL (ref 0.3–1.2)
Total Protein: 6.8 g/dL (ref 6.5–8.1)

## 2015-06-17 LAB — URINALYSIS, ROUTINE W REFLEX MICROSCOPIC
Bilirubin Urine: NEGATIVE
Glucose, UA: >1000 mg/dL — AB
Ketones, ur: NEGATIVE mg/dL
Leukocytes, UA: NEGATIVE
Nitrite: NEGATIVE
Protein, ur: 100 mg/dL — AB
Specific Gravity, Urine: 1.025 (ref 1.005–1.030)
Urobilinogen, UA: 1 mg/dL (ref 0.0–1.0)
pH: 6.5 (ref 5.0–8.0)

## 2015-06-17 LAB — CBC
HCT: 37.6 % (ref 36.0–46.0)
Hemoglobin: 12.6 g/dL (ref 12.0–15.0)
MCH: 27.2 pg (ref 26.0–34.0)
MCHC: 33.5 g/dL (ref 30.0–36.0)
MCV: 81.2 fL (ref 78.0–100.0)
Platelets: 274 10*3/uL (ref 150–400)
RBC: 4.63 MIL/uL (ref 3.87–5.11)
RDW: 13.5 % (ref 11.5–15.5)
WBC: 5 10*3/uL (ref 4.0–10.5)

## 2015-06-17 LAB — LIPASE, BLOOD: Lipase: 39 U/L (ref 22–51)

## 2015-06-17 LAB — URINE MICROSCOPIC-ADD ON

## 2015-06-17 LAB — CBG MONITORING, ED: Glucose-Capillary: 311 mg/dL — ABNORMAL HIGH (ref 65–99)

## 2015-06-17 MED ORDER — SODIUM CHLORIDE 0.9 % IV BOLUS (SEPSIS)
1000.0000 mL | Freq: Once | INTRAVENOUS | Status: AC
  Administered 2015-06-17: 1000 mL via INTRAVENOUS

## 2015-06-17 MED ORDER — KETOROLAC TROMETHAMINE 30 MG/ML IJ SOLN
30.0000 mg | Freq: Once | INTRAMUSCULAR | Status: AC
  Administered 2015-06-17: 30 mg via INTRAVENOUS
  Filled 2015-06-17: qty 1

## 2015-06-17 MED ORDER — ONDANSETRON HCL 4 MG PO TABS: 4.0000 mg | ORAL_TABLET | Freq: Three times a day (TID) | ORAL | Status: DC | PRN

## 2015-06-17 MED ORDER — MECLIZINE HCL 12.5 MG PO TABS: 12.5000 mg | ORAL_TABLET | Freq: Three times a day (TID) | ORAL | Status: DC | PRN

## 2015-06-17 MED ORDER — TAMSULOSIN HCL 0.4 MG PO CAPS: 0.4000 mg | ORAL_CAPSULE | Freq: Every day | ORAL | Status: DC

## 2015-06-17 MED ORDER — OXYCODONE-ACETAMINOPHEN 5-325 MG PO TABS: 1.0000 | ORAL_TABLET | ORAL | Status: DC | PRN

## 2015-06-17 NOTE — ED Notes (Signed)
Pt back from CT

## 2015-06-17 NOTE — Discharge Instructions (Signed)
Kidney Stones °Kidney stones (urolithiasis) are deposits that form inside your kidneys. The intense pain is caused by the stone moving through the urinary tract. When the stone moves, the ureter goes into spasm around the stone. The stone is usually passed in the urine.  °CAUSES  °· A disorder that makes certain neck glands produce too much parathyroid hormone (primary hyperparathyroidism). °· A buildup of uric acid crystals, similar to gout in your joints. °· Narrowing (stricture) of the ureter. °· A kidney obstruction present at birth (congenital obstruction). °· Previous surgery on the kidney or ureters. °· Numerous kidney infections. °SYMPTOMS  °· Feeling sick to your stomach (nauseous). °· Throwing up (vomiting). °· Blood in the urine (hematuria). °· Pain that usually spreads (radiates) to the groin. °· Frequency or urgency of urination. °DIAGNOSIS  °· Taking a history and physical exam. °· Blood or urine tests. °· CT scan. °· Occasionally, an examination of the inside of the urinary bladder (cystoscopy) is performed. °TREATMENT  °· Observation. °· Increasing your fluid intake. °· Extracorporeal shock wave lithotripsy--This is a noninvasive procedure that uses shock waves to break up kidney stones. °· Surgery may be needed if you have severe pain or persistent obstruction. There are various surgical procedures. Most of the procedures are performed with the use of small instruments. Only small incisions are needed to accommodate these instruments, so recovery time is minimized. °The size, location, and chemical composition are all important variables that will determine the proper choice of action for you. Talk to your health care provider to better understand your situation so that you will minimize the risk of injury to yourself and your kidney.  °HOME CARE INSTRUCTIONS  °· Drink enough water and fluids to keep your urine clear or pale yellow. This will help you to pass the stone or stone fragments. °· Strain  all urine through the provided strainer. Keep all particulate matter and stones for your health care provider to see. The stone causing the pain may be as small as a grain of salt. It is very important to use the strainer each and every time you pass your urine. The collection of your stone will allow your health care provider to analyze it and verify that a stone has actually passed. The stone analysis will often identify what you can do to reduce the incidence of recurrences. °· Only take over-the-counter or prescription medicines for pain, discomfort, or fever as directed by your health care provider. °· Make a follow-up appointment with your health care provider as directed. °· Get follow-up X-rays if required. The absence of pain does not always mean that the stone has passed. It may have only stopped moving. If the urine remains completely obstructed, it can cause loss of kidney function or even complete destruction of the kidney. It is your responsibility to make sure X-rays and follow-ups are completed. Ultrasounds of the kidney can show blockages and the status of the kidney. Ultrasounds are not associated with any radiation and can be performed easily in a matter of minutes. °SEEK MEDICAL CARE IF: °· You experience pain that is progressive and unresponsive to any pain medicine you have been prescribed. °SEEK IMMEDIATE MEDICAL CARE IF:  °· Pain cannot be controlled with the prescribed medicine. °· You have a fever or shaking chills. °· The severity or intensity of pain increases over 18 hours and is not relieved by pain medicine. °· You develop a new onset of abdominal pain. °· You feel faint or pass out. °·   You are unable to urinate. MAKE SURE YOU:   Understand these instructions.  Will watch your condition.  Will get help right away if you are not doing well or get worse. Document Released: 09/11/2005 Document Revised: 05/14/2013 Document Reviewed: 02/12/2013 [facility] Patient Information 2015  [place], [place]. This information is not intended to replace advice given to you by your health care provider. Make sure you discuss any questions you have with your health care provider.  Dizziness Dizziness is a common problem. It is a feeling of unsteadiness or light-headedness. You may feel like you are about to faint. Dizziness can lead to injury if you stumble or fall. A person of any age group can suffer from dizziness, but dizziness is more common in older adults. CAUSES  Dizziness can be caused by many different things, including:  Middle ear problems.  Standing for too long.  Infections.  An allergic reaction.  Aging.  An emotional response to something, such as the sight of blood.  Side effects of medicines.  Tiredness.  Problems with circulation or blood pressure.  Excessive use of alcohol or medicines, or illegal drug use.  Breathing too fast (hyperventilation).  An irregular heart rhythm (arrhythmia).  A low red blood cell count (anemia).  Pregnancy.  Vomiting, diarrhea, fever, or other illnesses that cause body fluid loss (dehydration).  Diseases or conditions such as Parkinson's disease, high blood pressure (hypertension), diabetes, and thyroid problems.  Exposure to extreme heat. DIAGNOSIS  Your health care provider will ask about your symptoms, perform a physical exam, and perform an electrocardiogram (ECG) to record the electrical activity of your heart. Your health care provider may also perform other heart or blood tests to determine the cause of your dizziness. These may include:  Transthoracic echocardiogram (TTE). During echocardiography, sound waves are used to evaluate how blood flows through your heart.  Transesophageal echocardiogram (TEE).  Cardiac monitoring. This allows your health care provider to monitor your heart rate and rhythm in real time.  Holter monitor. This is a portable device that records your heartbeat and can help diagnose  heart arrhythmias. It allows your health care provider to track your heart activity for several days if needed.  Stress tests by exercise or by giving medicine that makes the heart beat faster. TREATMENT  Treatment of dizziness depends on the cause of your symptoms and can vary greatly. HOME CARE INSTRUCTIONS   Drink enough fluids to keep your urine clear or pale yellow. This is especially important in very hot weather. In older adults, it is also important in cold weather.  Take your medicine exactly as directed if your dizziness is caused by medicines. When taking blood pressure medicines, it is especially important to get up slowly.  Rise slowly from chairs and steady yourself until you feel okay.  In the morning, first sit up on the side of the bed. When you feel okay, stand slowly while holding onto something until you know your balance is fine.  Move your legs often if you need to stand in one place for a long time. Tighten and relax your muscles in your legs while standing.  Have someone stay with you for 1-2 days if dizziness continues to be a problem. Do this until you feel you are well enough to stay alone. Have the person call your health care provider if he or she notices changes in you that are concerning.  Do not drive or use heavy machinery if you feel dizzy.  Do not drink  alcohol. SEEK IMMEDIATE MEDICAL CARE IF:   Your dizziness or light-headedness gets worse.  You feel nauseous or vomit.  You have problems talking, walking, or using your arms, hands, or legs.  You feel weak.  You are not thinking clearly or you have trouble forming sentences. It may take a friend or family member to notice this.  You have chest pain, abdominal pain, shortness of breath, or sweating.  Your vision changes.  You notice any bleeding.  You have side effects from medicine that seems to be getting worse rather than better. MAKE SURE YOU:   Understand these instructions.  Will  watch your condition.  Will get help right away if you are not doing well or get worse. Document Released: 03/07/2001 Document Revised: 09/16/2013 Document Reviewed: 03/31/2011 [facility] Patient Information 2015 [place], [place]. This information is not intended to replace advice given to you by your health care provider. Make sure you discuss any questions you have with your health care provider.

## 2015-06-17 NOTE — ED Notes (Signed)
PA at bedside.

## 2015-06-17 NOTE — ED Notes (Signed)
Pt reports her insulin was changed recently, notes urinary frequency, reports urinary "pressure". Lower back pain started yesterday 9/21, lower abd pain and dizziness starting today 9/22. abd pain 10/10.

## 2015-06-17 NOTE — ED Notes (Signed)
Pt escorted to discharge window. Pt verbalized understanding discharge instructions. In no acute distress.  

## 2015-06-17 NOTE — ED Notes (Addendum)
Per pt, states she has been urinating a lot-CBG 270-increased urination as well-lower back pain which started yesterday-states PCP d/c'ed Lantus and put her on a trial/new insulin

## 2015-06-17 NOTE — ED Notes (Signed)
Pt alert and oriented x4. Respirations even and unlabored, bilateral symmetrical rise and fall of chest. Skin warm and dry. In no acute distress. Denies needs.   

## 2015-06-17 NOTE — ED Provider Notes (Signed)
CSN: [id number]     Arrival date & time 06/17/15  1201 History   First MD Initiated Contact with Patient 06/17/15 1302     Chief Complaint  Patient presents with  . Dysuria  . Dizziness  . Abdominal Pain     (Consider location/radiation/quality/duration/timing/severity/associated sxs/prior Treatment) HPI   Patient is a 54 year old female with history of diabetes, hypertension, hyperlipidemia and kidney stent. She presents to emergency room with complaints of urinary frequency and urgency and intermittent vertigo symptoms.  He also felt low back pain starting yesterday but that has been intermittent. She denies any dysuria or hematuria. She states she has had some changes to her blood sugar medications and her urinary symptoms seem to be associated with her med changes, particularly her urinary frequency. However the last day she has had more urgency and several incontinent episodes which is new for her so she wanted to be evaluated in the ER.  She denies any fever, chills, sweats, nausea, vomiting. With her vertigo symptoms she is felt like the floor is moving and she has had the symptoms for over a month, they only last a few seconds, and seemed to be worse with different positional changes. She denies any ringing in her ears, loss of hearing or recent recent infection. She has not tried anything for the vertigo.     Past Medical History  Diagnosis Date  . Hypertension   . Diabetes mellitus   . HLD (hyperlipidemia)   . Pneumonia     7 yrs ago  . Dysrhythmia     1985  . Kidney stone on right side 03/2013   Past Surgical History  Procedure Laterality Date  . Lumbar laminectomy/decompression microdiscectomy  03/20/2012    Procedure: LUMBAR LAMINECTOMY/DECOMPRESSION MICRODISCECTOMY;  Surgeon: Sinclair Ship, MD;  Location: [facility];  Service: Orthopedics;  Laterality: Left;  Left sided lumbar 4-5 microdisectomy  . Spine surgery  2013  . Cesarean section  1990  . Abdominal  hysterectomy  2001   Family History  Problem Relation Age of Onset  . Stroke Neg Hx   . Cancer Neg Hx   . Heart disease Mother   . Kidney disease Father    Social History  Substance Use Topics  . Smoking status: Former Smoker    Quit date: 04/29/2004  . Smokeless tobacco: Never Used  . Alcohol Use: No   OB History    No data available     Review of Systems  Constitutional: Negative for fever, chills, diaphoresis, activity change, appetite change and fatigue.  HENT: Negative.   Eyes: Negative.   Respiratory: Negative.  Negative for shortness of breath.   Cardiovascular: Negative for chest pain, palpitations and leg swelling.  Gastrointestinal: Negative for nausea, vomiting, diarrhea, constipation, blood in stool, abdominal distention, anal bleeding and rectal pain.  Endocrine: Positive for polyuria.  Genitourinary: Positive for urgency, frequency and flank pain. Negative for hematuria, vaginal bleeding, vaginal discharge and vaginal pain.  Musculoskeletal: Positive for back pain. Negative for myalgias, joint swelling, arthralgias and gait problem.  Skin: Negative.   Neurological: Positive for dizziness. Negative for tremors, seizures, syncope, facial asymmetry, speech difficulty, weakness, light-headedness, numbness and headaches.  Psychiatric/Behavioral: Negative.       Allergies  Lisinopril and Hydrochlorothiazide  Home Medications   Prior to Admission medications   Medication Sig Start Date End Date Taking? Authorizing Provider  amLODipine (NORVASC) 10 MG tablet Take 1 tablet (10 mg total) by mouth at bedtime. 01/05/14  Yes Marsh Dolly  Marte, PA-C  glipiZIDE (GLUCOTROL XL) 10 MG 24 hr tablet Take 1 tablet (10 mg total) by mouth daily with breakfast. NO MORE REFILLS WITHOUT OFFICE VISIT - 2ND NOTICE 01/20/15  Yes Jaynee Eagles, PA-C  LIVIXIL PAK cream Apply 1 application topically as needed (pain).  03/23/15  Yes Historical Provider, MD  metFORMIN (GLUCOPHAGE) 1000 MG tablet  Take 1 tablet (1,000 mg total) by mouth 2 (two) times daily with a meal. NO MORE REFILLS WITHOUT OFFICE VISIT - 2ND NOTICE 01/20/15  Yes Jaynee Eagles, PA-C  Naproxen Sodium (ALEVE) 220 MG CAPS Take 2 capsules by mouth every 8 (eight) hours as needed (pain.).   Yes Historical Provider, MD  pravastatin (PRAVACHOL) 40 MG tablet Take 40 mg by mouth at bedtime. 05/25/15  Yes Historical Provider, MD  TRESIBA FLEXTOUCH 200 UNIT/ML SOPN Inject 35 Units into the skin at bedtime. 06/07/15  Yes Historical Provider, MD  Vitamin D, Ergocalciferol, (DRISDOL) 50000 UNITS CAPS capsule Take 50,000 Units by mouth once a week. 04/23/15  Yes Historical Provider, MD  Insulin Glargine (LANTUS SOLOSTAR) 100 UNIT/ML Solostar Pen Inject 30 Units into the skin at bedtime. PATIENT NEEDS OFFICE VISIT FOR ADDITIONAL REFILLS Patient not taking: Reported on 06/17/2015 01/08/15   Barton Fanny, MD  meclizine (ANTIVERT) 12.5 MG tablet Take 1 tablet (12.5 mg total) by mouth 3 (three) times daily as needed for dizziness. 06/17/15   Delsa Grana, PA-C  ondansetron (ZOFRAN) 4 MG tablet Take 1 tablet (4 mg total) by mouth every 8 (eight) hours as needed for nausea or vomiting. 06/17/15   Delsa Grana, PA-C  oxyCODONE-acetaminophen (PERCOCET) 5-325 MG per tablet Take 1 tablet by mouth every 4 (four) hours as needed. 06/17/15   Delsa Grana, PA-C  tamsulosin (FLOMAX) 0.4 MG CAPS capsule Take 1 capsule (0.4 mg total) by mouth daily. 06/17/15   Delsa Grana, PA-C   BP 150/83 mmHg  Pulse 73  Temp(Src) 98.6 F (37 C) (Oral)  Resp 20  SpO2 99% Physical Exam  Constitutional: She is oriented to person, place, and time. She appears well-developed and well-nourished. No distress.  HENT:  Head: Normocephalic and atraumatic.  Nose: Nose normal.  Mouth/Throat: Oropharynx is clear and moist. No oropharyngeal exudate.  Eyes: Conjunctivae and EOM are normal. Pupils are equal, round, and reactive to light. Right eye exhibits no discharge. Left eye exhibits  no discharge. No scleral icterus.  No nystagmus present  Neck: Normal range of motion. No JVD present. No tracheal deviation present. No thyromegaly present.  Cardiovascular: Normal rate, regular rhythm, normal heart sounds and intact distal pulses.  Exam reveals no gallop and no friction rub.   No murmur heard. Pulmonary/Chest: Effort normal and breath sounds normal. No respiratory distress. She has no wheezes. She has no rales. She exhibits no tenderness.  Abdominal: Soft. Bowel sounds are normal. She exhibits no distension and no mass. There is no tenderness. There is no rebound and no guarding.  Musculoskeletal: Normal range of motion. She exhibits no edema or tenderness.  Lymphadenopathy:    She has no cervical adenopathy.  Neurological: She is alert and oriented to person, place, and time. She has normal reflexes. No cranial nerve deficit. She exhibits normal muscle tone. Coordination normal.  Skin: Skin is warm and dry. No rash noted. She is not diaphoretic. No erythema. No pallor.  Psychiatric: She has a normal mood and affect. Her behavior is normal. Judgment and thought content normal.  Nursing note and vitals reviewed.   ED Course  Procedures (including critical care time) Labs Review Labs Reviewed  URINALYSIS, ROUTINE W REFLEX MICROSCOPIC (NOT AT [facility]) - Abnormal; Notable for the following:    APPearance CLOUDY (*)    Glucose, UA >1000 (*)    Hgb urine dipstick LARGE (*)    Protein, ur 100 (*)    All other components within normal limits  COMPREHENSIVE METABOLIC PANEL - Abnormal; Notable for the following:    Glucose, Bld 266 (*)    Calcium 8.4 (*)    Albumin 3.4 (*)    All other components within normal limits  CBG MONITORING, ED - Abnormal; Notable for the following:    Glucose-Capillary 311 (*)    All other components within normal limits  URINE CULTURE  CBC  URINE MICROSCOPIC-ADD ON  LIPASE, BLOOD    Imaging Review Ct Renal Stone Study  06/17/2015   CLINICAL  DATA:  Urinary pressure. Low back pain. Insulin-dependent diabetic. Abdominal pain. 10 of 2010 pain or  EXAM: CT ABDOMEN AND PELVIS WITHOUT CONTRAST  TECHNIQUE: Multidetector CT imaging of the abdomen and pelvis was performed following the standard protocol without IV contrast.  COMPARISON:  CT 07/10/2014  FINDINGS: Lower chest: Lung bases are clear.  Hepatobiliary: No focal hepatic lesion. No biliary duct dilatation. Gallbladder is normal. Common bile duct is normal.  Pancreas: Pancreas is normal. No ductal dilatation. No pancreatic inflammation.  Spleen: Normal spleen  Adrenals/urinary tract: Adrenal glands are normal.  There is mild caliectasis and proximal hydroureter on the RIGHT secondary to obstructing calculus in the mid RIGHT ureter measuring 5 mm image 46, series 2. This calculus is at the L4-L5 vertebral body level and is faintly seen on the CT topogram.  Tiny calculus in the lower pole the RIGHT kidney measures 1 mm. No bladder calculi or more distal ureteral calculi. No LEFT renal calculi.  Stomach/Bowel: Stomach, small bowel, appendix, and cecum are normal. The colon and rectosigmoid colon are normal.  Vascular/Lymphatic: Abdominal aorta is normal caliber. There is no retroperitoneal or periportal lymphadenopathy. No pelvic lymphadenopathy.  Reproductive: Post hysterectomy.  Musculoskeletal: No aggressive osseous lesion.  Other: No free fluid.  IMPRESSION: 1. Partially obstructing calculus within the mid RIGHT ureter. 2. Tiny RIGHT renal calculus.   Electronically Signed   By: Suzy Bouchard M.D.   On: 06/17/2015 14:41   I have personally reviewed and evaluated these images and lab results as part of my medical decision-making.   EKG Interpretation None      MDM   Final diagnoses:  Low back pain  Nephrolithiasis  Vertigo  Patient was found to have a right kidney stone, a urine culture was added. Patient was given tamsulosin and pain medication prescription, Toradol while in the ER, and  urology follow-up.  Patient does not have any pyuria, no leukocytosis, no fever, is not really complaining of any pain currently. Her urinalysis is pertinent for glucosuria, proteinuria and hematuria. Negative nitrites and negative leukocytes. Vertigo symptoms are intermittent lasting a few seconds. Given a trial of meclizine Patient was not orthostatic and had no neurological deficits  The patient is noted to have elevated blood sugar, she will follow up with her PCP regarding her medication changes, urinary symptoms and increased sugars.       Delsa Grana, PA-C 06/25/15 5102  Varney Biles, MD 06/28/15 [phone]

## 2015-06-18 LAB — URINE CULTURE: Culture: 6000

## 2015-08-04 ENCOUNTER — Emergency Department (HOSPITAL_COMMUNITY)
Admission: EM | Admit: 2015-08-04 | Discharge: 2015-08-04 | Discharge status: Against Medical Advice | Payer: BC Managed Care – PPO | Attending: Emergency Medicine | Admitting: Emergency Medicine

## 2015-08-04 DIAGNOSIS — R51 Headache: Secondary | ICD-10-CM | POA: Diagnosis not present

## 2015-08-04 DIAGNOSIS — I10 Essential (primary) hypertension: Secondary | ICD-10-CM | POA: Diagnosis not present

## 2015-08-04 DIAGNOSIS — R079 Chest pain, unspecified: Secondary | ICD-10-CM | POA: Diagnosis not present

## 2015-08-04 DIAGNOSIS — E119 Type 2 diabetes mellitus without complications: Secondary | ICD-10-CM | POA: Diagnosis not present

## 2015-08-04 NOTE — ED Notes (Signed)
Pt left after finding out that her BP was normal. Instructed to return if she felt different ie. Headaches, chest pain, etc. Stated she would see her PCP tomorrow. Alert, oriented and ambulatory upon leaving.

## 2016-02-02 ENCOUNTER — Ambulatory Visit: Payer: BC Managed Care – PPO | Admitting: Internal Medicine

## 2016-02-16 ENCOUNTER — Other Ambulatory Visit: Payer: Self-pay | Admitting: Nurse Practitioner

## 2016-02-16 DIAGNOSIS — R748 Abnormal levels of other serum enzymes: Secondary | ICD-10-CM

## 2016-02-29 ENCOUNTER — Ambulatory Visit
Admission: RE | Admit: 2016-02-29 | Discharge: 2016-02-29 | Disposition: A | Discharge status: Home / Self Care | Payer: BC Managed Care – PPO | Source: Ambulatory Visit | Attending: Internal Medicine | Admitting: Internal Medicine

## 2016-02-29 DIAGNOSIS — R748 Abnormal levels of other serum enzymes: Secondary | ICD-10-CM

## 2016-08-08 ENCOUNTER — Other Ambulatory Visit: Payer: Self-pay | Admitting: Internal Medicine

## 2016-08-08 DIAGNOSIS — Z1231 Encounter for screening mammogram for malignant neoplasm of breast: Secondary | ICD-10-CM

## 2016-08-29 ENCOUNTER — Emergency Department (HOSPITAL_COMMUNITY): Payer: BC Managed Care – PPO

## 2016-08-29 ENCOUNTER — Encounter (HOSPITAL_COMMUNITY): Payer: Self-pay | Admitting: Emergency Medicine

## 2016-08-29 ENCOUNTER — Emergency Department (HOSPITAL_COMMUNITY)
Admission: EM | Admit: 2016-08-29 | Discharge: 2016-08-29 | Disposition: A | Discharge status: Home / Self Care | Payer: BC Managed Care – PPO | Attending: Emergency Medicine | Admitting: Emergency Medicine

## 2016-08-29 DIAGNOSIS — I10 Essential (primary) hypertension: Secondary | ICD-10-CM | POA: Insufficient documentation

## 2016-08-29 DIAGNOSIS — Z794 Long term (current) use of insulin: Secondary | ICD-10-CM | POA: Insufficient documentation

## 2016-08-29 DIAGNOSIS — E1165 Type 2 diabetes mellitus with hyperglycemia: Secondary | ICD-10-CM | POA: Diagnosis not present

## 2016-08-29 DIAGNOSIS — R739 Hyperglycemia, unspecified: Secondary | ICD-10-CM

## 2016-08-29 DIAGNOSIS — Z87891 Personal history of nicotine dependence: Secondary | ICD-10-CM | POA: Diagnosis not present

## 2016-08-29 LAB — URINALYSIS, ROUTINE W REFLEX MICROSCOPIC
Bacteria, UA: NONE SEEN
Bilirubin Urine: NEGATIVE
Glucose, UA: >=500 mg/dL — AB
Hgb urine dipstick: NEGATIVE
Ketones, ur: NEGATIVE mg/dL
Leukocytes, UA: NEGATIVE
Nitrite: NEGATIVE
Protein, ur: 100 mg/dL — AB
Specific Gravity, Urine: 1.013 (ref 1.005–1.030)
pH: 8 (ref 5.0–8.0)

## 2016-08-29 LAB — CBC
HCT: 37.1 % (ref 36.0–46.0)
Hemoglobin: 12.1 g/dL (ref 12.0–15.0)
MCH: 26.8 pg (ref 26.0–34.0)
MCHC: 32.6 g/dL (ref 30.0–36.0)
MCV: 82.1 fL (ref 78.0–100.0)
Platelets: 282 10*3/uL (ref 150–400)
RBC: 4.52 MIL/uL (ref 3.87–5.11)
RDW: 13.7 % (ref 11.5–15.5)
WBC: 5.7 10*3/uL (ref 4.0–10.5)

## 2016-08-29 LAB — BASIC METABOLIC PANEL
Anion gap: 8 (ref 5–15)
BUN: 10 mg/dL (ref 6–20)
CO2: 24 mmol/L (ref 22–32)
Calcium: 8.9 mg/dL (ref 8.9–10.3)
Chloride: 104 mmol/L (ref 101–111)
Creatinine, Ser: 0.97 mg/dL (ref 0.44–1.00)
GFR calc Af Amer: >60 mL/min (ref >60)
GFR calc non Af Amer: >60 mL/min (ref >60)
Glucose, Bld: 410 mg/dL — ABNORMAL HIGH (ref 65–99)
Potassium: 3.9 mmol/L (ref 3.5–5.1)
Sodium: 136 mmol/L (ref 135–145)

## 2016-08-29 LAB — CBG MONITORING, ED
Glucose-Capillary: 384 mg/dL — ABNORMAL HIGH (ref 65–99)
Glucose-Capillary: 257 mg/dL — ABNORMAL HIGH (ref 65–99)

## 2016-08-29 MED ORDER — SODIUM CHLORIDE 0.9 % IV BOLUS (SEPSIS)
1000.0000 mL | Freq: Once | INTRAVENOUS | Status: AC
  Administered 2016-08-29: 1000 mL via INTRAVENOUS

## 2016-08-29 MED ORDER — INSULIN ASPART 100 UNIT/ML ~~LOC~~ SOLN
10.0000 [IU] | Freq: Once | SUBCUTANEOUS | Status: AC
  Administered 2016-08-29: 10 [IU] via SUBCUTANEOUS
  Filled 2016-08-29: qty 1

## 2016-08-29 NOTE — ED Notes (Signed)
PA at bedside.

## 2016-08-29 NOTE — ED Triage Notes (Signed)
Pt reports hyperglycemia , CBG 311 . Pt takes insulin and metformin. Denies abd pain nor nausea. sts chest pain when inhaling. Cough x 2 days. Reports lethargy , pressure when urinating, she sts.

## 2016-08-29 NOTE — Discharge Instructions (Signed)
Your blood sugar is elevated today.  Please check your blood sugar daily.  Monitor your food intake and eliminate starch or sugary food/soda.  Follow up with your doctor closely for further care.  Return if you have any concerns.

## 2016-08-29 NOTE — ED Notes (Signed)
Attempted IV access x2, both unsuccessful. 

## 2016-08-29 NOTE — ED Notes (Signed)
Patient transported to X-ray 

## 2016-08-29 NOTE — ED Provider Notes (Signed)
[facility] DEPT Provider Note   CSN: [id number] Arrival date & time: 08/29/16  1354     History   Chief Complaint Chief Complaint  Patient presents with  . Hyperglycemia    HPI Teresa Grant is a 55 y.o. female.  HPI   55 year old female with history of insulin-dependent diabetes, hypertension, hyperlipidemia presenting for evaluation of elevated blood sugar.patient reports she had her granddaughter over at her house over the weekend. Her granddaughter had cold symptoms and afterward patient developed similar symptoms. States she was feeling lethargic, nasal congestion, throat irritation, occasional cough. The next day, she noticed some urinary frequency and therefore was concern of elevated blood sugar as it is a sign that she normally associated with hyperglycemia. She has been checking her blood sugar and noticed that it has been elevating upward to the 300s. She tries to drink extra fluid some improvement. She decided to come here for further evaluation and management. She denies any active fever, headache, neck stiffness, chest pain, shortness of breath, productive cough, back pain, burning urination, or rash. She did report some mild tenderness to left side of abdomen yesterday lasting briefly and has since resolved.patient has been taking her medication but states she does not normally check blood sugar.  Past Medical History:  Diagnosis Date  . Diabetes mellitus   . Dysrhythmia    1985  . HLD (hyperlipidemia)   . Hypertension   . Kidney stone on right side 03/2013  . Pneumonia    7 yrs ago    Patient Active Problem List   Diagnosis Date Noted  . Chest pain 07/10/2014  . Abdominal pain 07/10/2014  . Type II or unspecified type diabetes mellitus without mention of complication, uncontrolled 09/05/2013  . Renal calculus, right 06/12/2013  . Diabetes with proteinuria 11/05/2012  . DDD (degenerative disc disease), lumbar 06/23/2012  . HYPERLIPIDEMIA 11/27/2006  .  Essential hypertension 11/27/2006    Past Surgical History:  Procedure Laterality Date  . ABDOMINAL HYSTERECTOMY  2001  . CESAREAN SECTION  1990  . LUMBAR LAMINECTOMY/DECOMPRESSION MICRODISCECTOMY  03/20/2012   Procedure: LUMBAR LAMINECTOMY/DECOMPRESSION MICRODISCECTOMY;  Surgeon: Sinclair Ship, MD;  Location: [facility];  Service: Orthopedics;  Laterality: Left;  Left sided lumbar 4-5 microdisectomy  . SPINE SURGERY  2013    OB History    No data available       Home Medications    Prior to Admission medications   Medication Sig Start Date End Date Taking? Authorizing Provider  amLODipine (NORVASC) 10 MG tablet Take 1 tablet (10 mg total) by mouth at bedtime. 01/05/14  Yes Heather M Marte, PA-C  glipiZIDE (GLUCOTROL XL) 10 MG 24 hr tablet Take 1 tablet (10 mg total) by mouth daily with breakfast. NO MORE REFILLS WITHOUT OFFICE VISIT - 2ND NOTICE 01/20/15  Yes Jaynee Eagles, PA-C  metFORMIN (GLUCOPHAGE) 1000 MG tablet Take 1 tablet (1,000 mg total) by mouth 2 (two) times daily with a meal. NO MORE REFILLS WITHOUT OFFICE VISIT - 2ND NOTICE 01/20/15  Yes Jaynee Eagles, PA-C  pravastatin (PRAVACHOL) 40 MG tablet Take 40 mg by mouth at bedtime. 05/25/15  Yes Historical Provider, MD  TRESIBA FLEXTOUCH 200 UNIT/ML SOPN Inject 44 Units into the skin at bedtime.  06/07/15  Yes Historical Provider, MD  Insulin Glargine (LANTUS SOLOSTAR) 100 UNIT/ML Solostar Pen Inject 30 Units into the skin at bedtime. PATIENT NEEDS OFFICE VISIT FOR ADDITIONAL REFILLS Patient not taking: Reported on 08/29/2016 01/08/15   Barton Fanny, MD  meclizine (  ANTIVERT) 12.5 MG tablet Take 1 tablet (12.5 mg total) by mouth 3 (three) times daily as needed for dizziness. Patient not taking: Reported on 08/29/2016 06/17/15   Delsa Grana, PA-C    Family History Family History  Problem Relation Age of Onset  . Heart disease Mother   . Kidney disease Father   . Stroke Neg Hx   . Cancer Neg Hx     Social History Social  History  Substance Use Topics  . Smoking status: Former Smoker    Quit date: 04/29/2004  . Smokeless tobacco: Never Used  . Alcohol use No     Allergies   Lisinopril and Hydrochlorothiazide   Review of Systems Review of Systems  All other systems reviewed and are negative.    Physical Exam Updated Vital Signs BP 137/87   Pulse 93   Temp 98.7 F (37.1 C) (Oral)   Resp 18   SpO2 98%   Physical Exam  Constitutional: She is oriented to person, place, and time. She appears well-developed and well-nourished. No distress.  HENT:  Head: Atraumatic.  Right Ear: External ear normal.  Left Ear: External ear normal.  Mouth/Throat: Oropharynx is clear and moist.  Eyes: Conjunctivae are normal.  Neck: Neck supple.  No nuchal rigidity  Cardiovascular: Normal rate and regular rhythm.   Pulmonary/Chest: Effort normal and breath sounds normal.  Abdominal: Soft. Bowel sounds are normal. She exhibits no distension. There is no tenderness.  Neurological: She is alert and oriented to person, place, and time.  Skin: No rash noted.  Psychiatric: She has a normal mood and affect.  Nursing note and vitals reviewed.    ED Treatments / Results  Labs (all labs ordered are listed, but only abnormal results are displayed) Labs Reviewed  BASIC METABOLIC PANEL - Abnormal; Notable for the following:       Result Value   Glucose, Bld 410 (*)    All other components within normal limits  CBG MONITORING, ED - Abnormal; Notable for the following:    Glucose-Capillary 384 (*)    All other components within normal limits  CBC  URINALYSIS, ROUTINE W REFLEX MICROSCOPIC    EKG  EKG Interpretation None       Radiology No results found.  Procedures Procedures (including critical care time)  Medications Ordered in ED Medications - No data to display   Initial Impression / Assessment and Plan / ED Course  I have reviewed the triage vital signs and the nursing notes.  Pertinent labs  & imaging results that were available during my care of the patient were reviewed by me and considered in my medical decision making (see chart for details).  Clinical Course     BP 141/97   Pulse 74   Temp 98.7 F (37.1 C) (Oral)   Resp 17   SpO2 100%    Final Clinical Impressions(s) / ED Diagnoses   Final diagnoses:  Hyperglycemia    New Prescriptions New Prescriptions   No medications on file   5:02 PM Patient with history of insulin-dependent diabetes here because her blood sugar is elevated. She did report some recent sick contact, suspect post-symptoms. She is well-appearing, afebrile, vital signs are stable. Initial CBG is 410 without any anion gap. IV fluid and insulin given, will screen for potential infection with chest x-ray and UA.  6:22 PM CBG improves to 257 after IVF and 10 unit of insulin. No source of infection noted on labs. No signs of DKA.  I  encourage pt to f/u with PCP for further management of her hyperglycemia.  Return precaution discussed.     Domenic Moras, PA-C 08/29/16 1839    Veryl Speak, MD 08/30/16 [phone]

## 2016-08-31 ENCOUNTER — Ambulatory Visit
Admission: RE | Admit: 2016-08-31 | Discharge: 2016-08-31 | Disposition: A | Discharge status: Home / Self Care | Payer: BC Managed Care – PPO | Source: Ambulatory Visit | Attending: Internal Medicine | Admitting: Internal Medicine

## 2016-08-31 DIAGNOSIS — Z1231 Encounter for screening mammogram for malignant neoplasm of breast: Secondary | ICD-10-CM

## 2016-12-15 ENCOUNTER — Emergency Department (HOSPITAL_COMMUNITY)
Admission: EM | Admit: 2016-12-15 | Discharge: 2016-12-15 | Disposition: A | Discharge status: Home / Self Care | Payer: BC Managed Care – PPO | Attending: Emergency Medicine | Admitting: Emergency Medicine

## 2016-12-15 ENCOUNTER — Encounter (HOSPITAL_COMMUNITY): Payer: Self-pay | Admitting: Emergency Medicine

## 2016-12-15 DIAGNOSIS — Z7984 Long term (current) use of oral hypoglycemic drugs: Secondary | ICD-10-CM | POA: Diagnosis not present

## 2016-12-15 DIAGNOSIS — E119 Type 2 diabetes mellitus without complications: Secondary | ICD-10-CM | POA: Diagnosis not present

## 2016-12-15 DIAGNOSIS — M545 Low back pain: Secondary | ICD-10-CM | POA: Diagnosis present

## 2016-12-15 DIAGNOSIS — I10 Essential (primary) hypertension: Secondary | ICD-10-CM | POA: Insufficient documentation

## 2016-12-15 DIAGNOSIS — Z79899 Other long term (current) drug therapy: Secondary | ICD-10-CM | POA: Diagnosis not present

## 2016-12-15 DIAGNOSIS — Z87891 Personal history of nicotine dependence: Secondary | ICD-10-CM | POA: Diagnosis not present

## 2016-12-15 DIAGNOSIS — M5442 Lumbago with sciatica, left side: Secondary | ICD-10-CM | POA: Diagnosis not present

## 2016-12-15 MED ORDER — NAPROXEN 500 MG PO TABS: 500.0000 mg | ORAL_TABLET | Freq: Two times a day (BID) | ORAL | 0 refills | Status: DC

## 2016-12-15 MED ORDER — METHOCARBAMOL 500 MG PO TABS: 500.0000 mg | ORAL_TABLET | Freq: Every evening | ORAL | 0 refills | Status: DC | PRN

## 2016-12-15 MED ORDER — NAPROXEN 500 MG PO TABS
500.0000 mg | ORAL_TABLET | Freq: Once | ORAL | Status: AC
  Administered 2016-12-15: 500 mg via ORAL
  Filled 2016-12-15: qty 1

## 2016-12-15 NOTE — ED Notes (Signed)
Bed: WTR5 Expected date:  Expected time:  Means of arrival:  Comments: 

## 2016-12-15 NOTE — ED Triage Notes (Signed)
Pt. Reports left sided back pain that radiates down to her left buttocks. Pain has progressively gotten worse for two weeks. Pain intensifies with wt. Bearing. Pt. Denies any injury or trauma to her back.

## 2016-12-15 NOTE — ED Provider Notes (Signed)
[facility] DEPT Provider Note   CSN: [id number] Arrival date & time: 12/15/16  7824   By signing my name below, I, Teresa Grant, attest that this documentation has been prepared under the direction and in the presence of Teresa Echevaria, PA-C Electronically Signed: Lane, ED Scribe. 12/15/16. 10:39 AM.  History   Chief Complaint Chief Complaint  Patient presents with  . Back Pain    HPI Teresa Grant is a 56 y.o. female with a PMHx of DM, HTN, hyperlipidemia, who presents to the Emergency Department complaining of left mid-to-lower back pain onset 2 weeks ago worsening this morning. Pt left mid-to-lower back pain radiates to her left buttocks. She notes that her left mid-to-lower back pain is alleviated with sitting and worsened with weight bearing. Pt reports associated tingling sensation to left buttocks. Pt has tried Rx muscle relaxer with mild relief of her symptoms. She notes that she has a hx of bulging discs and her symptoms are similar. She states that she had back surgery completed 4-5 years ago with immediate relief of her symptoms. Pt denies numbness, bowel/bladder incontinence, fever, abdominal pain, melena, diarrhea, difficulty urinating, recent fall, recent injury, and any other symptoms. Denies CA or IV drug use. She states that she does have a PCP at this time.     The history is provided by the patient. No language interpreter was used.    Past Medical History:  Diagnosis Date  . Diabetes mellitus   . Dysrhythmia    1985  . HLD (hyperlipidemia)   . Hypertension   . Kidney stone on right side 03/2013  . Pneumonia    7 yrs ago    Patient Active Problem List   Diagnosis Date Noted  . Chest pain 07/10/2014  . Abdominal pain 07/10/2014  . Type II or unspecified type diabetes mellitus without mention of complication, uncontrolled 09/05/2013  . Renal calculus, right 06/12/2013  . Diabetes with proteinuria 11/05/2012  . DDD (degenerative disc  disease), lumbar 06/23/2012  . HYPERLIPIDEMIA 11/27/2006  . Essential hypertension 11/27/2006    Past Surgical History:  Procedure Laterality Date  . ABDOMINAL HYSTERECTOMY  2001  . BACK SURGERY    . CESAREAN SECTION  1990  . LUMBAR LAMINECTOMY/DECOMPRESSION MICRODISCECTOMY  03/20/2012   Procedure: LUMBAR LAMINECTOMY/DECOMPRESSION MICRODISCECTOMY;  Surgeon: Sinclair Ship, MD;  Location: [facility];  Service: Orthopedics;  Laterality: Left;  Left sided lumbar 4-5 microdisectomy  . SPINE SURGERY  2013    OB History    No data available       Home Medications    Prior to Admission medications   Medication Sig Start Date End Date Taking? Authorizing Provider  amLODipine (NORVASC) 10 MG tablet Take 1 tablet (10 mg total) by mouth at bedtime. 01/05/14  Yes Heather Elnora Morrison, PA-C  cholecalciferol (VITAMIN D) 1000 units tablet Take 1,000 Units by mouth daily.   Yes Historical Provider, MD  glipiZIDE (GLUCOTROL XL) 10 MG 24 hr tablet Take 1 tablet (10 mg total) by mouth daily with breakfast. NO MORE REFILLS WITHOUT OFFICE VISIT - 2ND NOTICE 01/20/15  Yes Jaynee Eagles, PA-C  meclizine (ANTIVERT) 12.5 MG tablet Take 1 tablet (12.5 mg total) by mouth 3 (three) times daily as needed for dizziness. 06/17/15  Yes Delsa Grana, PA-C  metFORMIN (GLUCOPHAGE) 1000 MG tablet Take 1 tablet (1,000 mg total) by mouth 2 (two) times daily with a meal. NO MORE REFILLS WITHOUT OFFICE VISIT - 2ND NOTICE 01/20/15  Yes Jaynee Eagles, PA-C  pravastatin (PRAVACHOL) 40 MG tablet Take 40 mg by mouth at bedtime. 05/25/15  Yes Historical Provider, MD  TRESIBA FLEXTOUCH 200 UNIT/ML SOPN Inject 44-45 Units into the skin at bedtime.  06/07/15  Yes Historical Provider, MD  methocarbamol (ROBAXIN) 500 MG tablet Take 1 tablet (500 mg total) by mouth at bedtime as needed for muscle spasms. 12/15/16   Emeline General, PA-C  naproxen (NAPROSYN) 500 MG tablet Take 1 tablet (500 mg total) by mouth 2 (two) times daily with a meal. 12/15/16    Emeline General, PA-C    Family History Family History  Problem Relation Age of Onset  . Heart disease Mother   . Kidney disease Father   . Stroke Neg Hx   . Cancer Neg Hx     Social History Social History  Substance Use Topics  . Smoking status: Former Smoker    Quit date: 04/29/2004  . Smokeless tobacco: Never Used  . Alcohol use No     Allergies   Lisinopril and Hydrochlorothiazide   Review of Systems Review of Systems  Constitutional: Positive for chills. Negative for appetite change, fatigue, fever and unexpected weight change.  Respiratory: Negative for cough, choking, chest tightness and shortness of breath.   Cardiovascular: Negative for chest pain, palpitations and leg swelling.  Gastrointestinal: Negative for abdominal pain, blood in stool, diarrhea, nausea and vomiting.       No bowel incontinence.   Genitourinary: Negative for difficulty urinating, dysuria, flank pain and frequency.       No bladder incontinence.   Musculoskeletal: Positive for back pain (mid-to-lower). Negative for neck pain and neck stiffness.  Skin: Negative for color change, pallor and rash.  Neurological: Negative for weakness and numbness.       +Tingling to left leg     Physical Exam Updated Vital Signs BP 139/89 (BP Location: Right Arm)   Pulse 82   Temp 97.8 F (36.6 C) (Oral)   Resp 18   Ht 5\' 1"  (1.549 m)   Wt 170 lb (77.1 kg)   SpO2 98%   BMI 32.12 kg/m   Physical Exam  Constitutional: She is oriented to person, place, and time. She appears well-developed and well-nourished. No distress.  Patient is afebrile, non-toxic appearing, laying on right side in apparent discomfort.  HENT:  Head: Normocephalic and atraumatic.  Eyes: EOM are normal.  Neck: Neck supple.  Cardiovascular: Normal rate, regular rhythm and normal heart sounds.  Exam reveals no gallop and no friction rub.   No murmur heard. Pulmonary/Chest: Effort normal and breath sounds normal. No respiratory  distress. She has no wheezes. She has no rales.  Abdominal: She exhibits no distension.  Musculoskeletal: Normal range of motion.       Thoracic back: She exhibits tenderness.  Tenderness to thoracic musculature on left side. No midline spinal tenderness. +SLR bilaterally. NVI bilaterally. Equal 5/5 strength with hip, leg, and plantar flexion and extension. Strong dp pulses.  Neurological: She is alert and oriented to person, place, and time.  Skin: Skin is warm and dry.  Psychiatric: She has a normal mood and affect. Her behavior is normal.  Nursing note and vitals reviewed.    ED Treatments / Results  DIAGNOSTIC STUDIES: Oxygen Saturation is 98% on RA, nl by my interpretation.    COORDINATION OF CARE: 10:29 AM Discussed treatment plan with pt at bedside which includes robaxin Rx, naprosyn Rx, and pt agreed to plan.  Procedures Procedures (including critical care time)  Medications  Ordered in ED Medications  naproxen (NAPROSYN) tablet 500 mg (not administered)     Initial Impression / Assessment and Plan / ED Course  I have reviewed the triage vital signs and the nursing notes.   Patient with back pain. No neurological deficits and normal neuro exam.  Patient is ambulatory. No loss of bowel or bladder control. No concern for cauda equina. No fever, weight loss, h/o cancer, IVDA, no recent procedure to back. No urinary symptoms suggestive of UTI. Will be discharged home with robaxin and naprosyn Rx. Supportive care and return precaution discussed. Appears safe for discharge at this time. Follow up as indicated in discharge paperwork.   Patient will be following up with her PCP on Monday.  Discussed strict return precautions and advised to return to the emergency department if experiencing any new or worsening symptoms. Instructions were understood and patient agreed with discharge plan.   Final Clinical Impressions(s) / ED Diagnoses   Final diagnoses:  Acute left-sided low  back pain with left-sided sciatica    New Prescriptions New Prescriptions   METHOCARBAMOL (ROBAXIN) 500 MG TABLET    Take 1 tablet (500 mg total) by mouth at bedtime as needed for muscle spasms.   NAPROXEN (NAPROSYN) 500 MG TABLET    Take 1 tablet (500 mg total) by mouth 2 (two) times daily with a meal.   I personally performed the services described in this documentation, which was scribed in my presence. The recorded information has been reviewed and is accurate.     Emeline General, PA-C 12/15/16 2010    Quintella Reichert, MD 12/18/16 1538

## 2016-12-15 NOTE — Discharge Instructions (Signed)
Use ice, back exercises naproxen. Follow up with your primary care provider on Monday. If you experience fever, chills, numbness, loss of bowel or bladder function or any other concerning symptoms in the meantime, return to the emergency department.

## 2016-12-26 ENCOUNTER — Emergency Department (HOSPITAL_BASED_OUTPATIENT_CLINIC_OR_DEPARTMENT_OTHER)
Admission: EM | Admit: 2016-12-26 | Discharge: 2016-12-27 | Disposition: A | Discharge status: Home / Self Care | Payer: BC Managed Care – PPO | Attending: Emergency Medicine | Admitting: Emergency Medicine

## 2016-12-26 ENCOUNTER — Encounter (HOSPITAL_BASED_OUTPATIENT_CLINIC_OR_DEPARTMENT_OTHER): Payer: Self-pay

## 2016-12-26 DIAGNOSIS — E119 Type 2 diabetes mellitus without complications: Secondary | ICD-10-CM | POA: Insufficient documentation

## 2016-12-26 DIAGNOSIS — I10 Essential (primary) hypertension: Secondary | ICD-10-CM | POA: Insufficient documentation

## 2016-12-26 DIAGNOSIS — L723 Sebaceous cyst: Secondary | ICD-10-CM | POA: Diagnosis not present

## 2016-12-26 DIAGNOSIS — Z87891 Personal history of nicotine dependence: Secondary | ICD-10-CM | POA: Insufficient documentation

## 2016-12-26 DIAGNOSIS — Z7984 Long term (current) use of oral hypoglycemic drugs: Secondary | ICD-10-CM | POA: Insufficient documentation

## 2016-12-26 DIAGNOSIS — L02416 Cutaneous abscess of left lower limb: Secondary | ICD-10-CM | POA: Diagnosis present

## 2016-12-26 NOTE — ED Triage Notes (Signed)
Pt states that she washing her leg and it felt tender and it was a little bump, now has gotten bigger and there is two knots, pt states cant sit on the left side. Pt states that now it is burning.

## 2016-12-27 MED ORDER — SULFAMETHOXAZOLE-TRIMETHOPRIM 800-160 MG PO TABS: 1.0000 | ORAL_TABLET | Freq: Two times a day (BID) | ORAL | 0 refills | Status: AC

## 2016-12-27 MED ORDER — LIDOCAINE HCL (PF) 1 % IJ SOLN
INTRAMUSCULAR | Status: AC
  Administered 2016-12-27: 5 mL
  Filled 2016-12-27: qty 5

## 2016-12-27 NOTE — ED Provider Notes (Signed)
[facility] DEPT [facility] Provider Note   CSN: [id number] Arrival date & time: 12/26/16  2304     History   Chief Complaint Chief Complaint  Patient presents with  . Abscess    HPI Teresa Grant is a 56 y.o. female.  HPI   Teresa Grant is a 56 y.o. female, with a history of DM and HTN, presenting to the ED with 2 bumps on the inner left thigh that arose a few days ago. The area is painful to the touch. She has not tried any medications. She did apply warm compresses and this seemed to form a "head" on one of the bumps. No known previous MRSA infection. No antibiotic use in the previous 3 months. She denies fever, drainage, or any other complaints.     Past Medical History:  Diagnosis Date  . Diabetes mellitus   . Dysrhythmia    1985  . HLD (hyperlipidemia)   . Hypertension   . Kidney stone on right side 03/2013  . Pneumonia    7 yrs ago    Patient Active Problem List   Diagnosis Date Noted  . Chest pain 07/10/2014  . Abdominal pain 07/10/2014  . Type II or unspecified type diabetes mellitus without mention of complication, uncontrolled 09/05/2013  . Renal calculus, right 06/12/2013  . Diabetes with proteinuria 11/05/2012  . DDD (degenerative disc disease), lumbar 06/23/2012  . HYPERLIPIDEMIA 11/27/2006  . Essential hypertension 11/27/2006    Past Surgical History:  Procedure Laterality Date  . ABDOMINAL HYSTERECTOMY  2001  . BACK SURGERY    . CESAREAN SECTION  1990  . LUMBAR LAMINECTOMY/DECOMPRESSION MICRODISCECTOMY  03/20/2012   Procedure: LUMBAR LAMINECTOMY/DECOMPRESSION MICRODISCECTOMY;  Surgeon: Sinclair Ship, MD;  Location: [facility];  Service: Orthopedics;  Laterality: Left;  Left sided lumbar 4-5 microdisectomy  . SPINE SURGERY  2013    OB History    No data available       Home Medications    Prior to Admission medications   Medication Sig Start Date End Date Taking? Authorizing Provider  amLODipine (NORVASC) 10 MG tablet Take 1  tablet (10 mg total) by mouth at bedtime. 01/05/14   Collene Leyden, PA-C  cholecalciferol (VITAMIN D) 1000 units tablet Take 1,000 Units by mouth daily.    Historical Provider, MD  glipiZIDE (GLUCOTROL XL) 10 MG 24 hr tablet Take 1 tablet (10 mg total) by mouth daily with breakfast. NO MORE REFILLS WITHOUT OFFICE VISIT - 2ND NOTICE 01/20/15   Jaynee Eagles, PA-C  meclizine (ANTIVERT) 12.5 MG tablet Take 1 tablet (12.5 mg total) by mouth 3 (three) times daily as needed for dizziness. 06/17/15   Delsa Grana, PA-C  metFORMIN (GLUCOPHAGE) 1000 MG tablet Take 1 tablet (1,000 mg total) by mouth 2 (two) times daily with a meal. NO MORE REFILLS WITHOUT OFFICE VISIT - 2ND NOTICE 01/20/15   Jaynee Eagles, PA-C  methocarbamol (ROBAXIN) 500 MG tablet Take 1 tablet (500 mg total) by mouth at bedtime as needed for muscle spasms. 12/15/16   Emeline General, PA-C  naproxen (NAPROSYN) 500 MG tablet Take 1 tablet (500 mg total) by mouth 2 (two) times daily with a meal. 12/15/16   Emeline General, PA-C  pravastatin (PRAVACHOL) 40 MG tablet Take 40 mg by mouth at bedtime. 05/25/15   Historical Provider, MD  sulfamethoxazole-trimethoprim (BACTRIM DS,SEPTRA DS) 800-160 MG tablet Take 1 tablet by mouth 2 (two) times daily. 12/27/16 01/01/17  Daiel Strohecker C Mazzie Brodrick, PA-C  TRESIBA FLEXTOUCH 200 UNIT/ML  SOPN Inject 44-45 Units into the skin at bedtime.  06/07/15   Historical Provider, MD    Family History Family History  Problem Relation Age of Onset  . Heart disease Mother   . Kidney disease Father   . Stroke Neg Hx   . Cancer Neg Hx     Social History Social History  Substance Use Topics  . Smoking status: Former Smoker    Quit date: 04/29/2004  . Smokeless tobacco: Never Used  . Alcohol use No     Comment: occ     Allergies   Lisinopril and Hydrochlorothiazide   Review of Systems Review of Systems  Constitutional: Negative for fever.  Gastrointestinal: Negative for nausea and vomiting.  Musculoskeletal: Positive for  myalgias.  Skin: Positive for color change.     Physical Exam Updated Vital Signs BP (!) 149/94 (BP Location: Right Arm)   Pulse 92   Temp 98.1 F (36.7 C) (Oral)   Resp 20   Ht 5\' 1"  (1.549 m)   Wt 79.4 kg   SpO2 98%   BMI 33.07 kg/m   Physical Exam  Constitutional: She appears well-developed and well-nourished. No distress.  HENT:  Head: Normocephalic and atraumatic.  Eyes: Conjunctivae are normal.  Neck: Neck supple.  Cardiovascular: Normal rate and regular rhythm.   Pulmonary/Chest: Effort normal.  Neurological: She is alert.  Skin: Skin is warm and dry. She is not diaphoretic.  Fluctuant, tender mass approximately 2 cm across to the upper inner left thigh with small area of surrounding erythema.  Second, indurated mass noted close by measuring approximately 1.5 cm.  Psychiatric: She has a normal mood and affect. Her behavior is normal.  Nursing note and vitals reviewed.    ED Treatments / Results  Labs (all labs ordered are listed, but only abnormal results are displayed) Labs Reviewed - No data to display  EKG  EKG Interpretation None       Radiology No results found.  Procedures .Marland KitchenIncision and Drainage Date/Time: 12/27/2016 12:37 AM Performed by: Lorayne Bender Authorized by: Arlean Hopping C   Consent:    Consent obtained:  Verbal   Consent given by:  Patient   Risks discussed:  Bleeding, incomplete drainage, pain, infection and damage to other organs Location:    Type:  Cyst   Size:  2   Location:  Lower extremity   Lower extremity location:  Leg   Leg location:  L upper leg Pre-procedure details:    Skin preparation:  Betadine Anesthesia (see MAR for exact dosages):    Anesthesia method:  Local infiltration   Local anesthetic:  Lidocaine 1% w/o epi Procedure type:    Complexity:  Simple Procedure details:    Incision types:  Cruciate   Incision depth:  Subcutaneous   Scalpel blade:  11   Wound management:  Debrided   Drainage:  Bloody  (sebaceous material)   Drainage amount:  Scant   Wound treatment:  Wound left open   Packing materials:  None Post-procedure details:    Patient tolerance of procedure:  Tolerated well, no immediate complications    (including critical care time)  Medications Ordered in ED Medications  lidocaine (PF) (XYLOCAINE) 1 % injection (5 mLs  Given 12/27/16 0035)     Initial Impression / Assessment and Plan / ED Course  I have reviewed the triage vital signs and the nursing notes.  Pertinent labs & imaging results that were available during my care of the patient were reviewed  by me and considered in my medical decision making (see chart for details).      Patient presents with what appears to be sebaceous cyst. This was successfully drained without immediate complication. The second mass does not appear to have an area of fluctuance appropriate for drainage. Antibiotic therapy initiated. PCP follow up for wound check. The patient was given instructions for home care as well as return precautions. Patient voices understanding of these instructions, accepts the plan, and is comfortable with discharge.     Final Clinical Impressions(s) / ED Diagnoses   Final diagnoses:  Sebaceous cyst    New Prescriptions Discharge Medication List as of 12/27/2016 12:42 AM    START taking these medications   Details  sulfamethoxazole-trimethoprim (BACTRIM DS,SEPTRA DS) 800-160 MG tablet Take 1 tablet by mouth 2 (two) times daily., Starting Wed 12/27/2016, Until Mon 01/01/2017, Print         Lorayne Bender, PA-C 12/27/16 [place], DO 12/27/16 3662

## 2016-12-27 NOTE — Discharge Instructions (Signed)
Remove the bandage after 24 hours. You must wait at least 8 hours after the wound repair to wash the wound. Clean the wound and surrounding area gently with tap water and mild soap. Rinse well and blot dry. You may shower, but avoid submerging the wound, such as with a bath or swimming. Clean the wound daily to prevent infection. Do not use cleaners such as hydrogen peroxide or alcohol.   Scar reduction: After the wound has healed, application of ointments such as Aquaphor can help reduce scar formation.  Pain: You may use Tylenol, naproxen, or ibuprofen for pain.  Please take all of your antibiotics until finished!   You may develop abdominal discomfort or diarrhea from the antibiotic.  You may help offset this with probiotics which you can buy or get in yogurt. Do not eat or take the probiotics until 2 hours after your antibiotic.   Follow up with your primary care provider in about a week for wound check. Return to the ED for worsening symptoms.

## 2016-12-27 NOTE — ED Notes (Signed)
Gave pt mesh panties and abd pad to avoid palcing tape in her groin area

## 2017-05-31 ENCOUNTER — Encounter: Payer: Self-pay | Admitting: Gastroenterology

## 2017-07-16 ENCOUNTER — Ambulatory Visit (AMBULATORY_SURGERY_CENTER): Payer: Self-pay | Admitting: *Deleted

## 2017-07-16 ENCOUNTER — Encounter: Payer: Self-pay | Admitting: Gastroenterology

## 2017-07-16 VITALS — Ht 61.0 in | Wt 173.2 lb

## 2017-07-16 DIAGNOSIS — Z1211 Encounter for screening for malignant neoplasm of colon: Secondary | ICD-10-CM

## 2017-07-16 MED ORDER — NA SULFATE-K SULFATE-MG SULF 17.5-3.13-1.6 GM/177ML PO SOLN: 1.0000 [IU] | Freq: Once | ORAL | 0 refills | Status: AC

## 2017-07-16 NOTE — Progress Notes (Signed)
No egg or soy allergy known to patient   issues with past sedation with any surgeries  or procedures, no intubation problems  Hard to wake up  No diet pills per patient No home 02 use per patient  No blood thinners per patient  Pt denies issues with constipation  No A fib or A flutter  EMMI video sent to pt's e mail

## 2017-07-22 ENCOUNTER — Emergency Department (HOSPITAL_BASED_OUTPATIENT_CLINIC_OR_DEPARTMENT_OTHER)
Admission: EM | Admit: 2017-07-22 | Discharge: 2017-07-22 | Disposition: A | Discharge status: Home / Self Care | Payer: BC Managed Care – PPO | Attending: Emergency Medicine | Admitting: Emergency Medicine

## 2017-07-22 ENCOUNTER — Encounter (HOSPITAL_BASED_OUTPATIENT_CLINIC_OR_DEPARTMENT_OTHER): Payer: Self-pay | Admitting: Emergency Medicine

## 2017-07-22 DIAGNOSIS — E119 Type 2 diabetes mellitus without complications: Secondary | ICD-10-CM | POA: Diagnosis not present

## 2017-07-22 DIAGNOSIS — Z79899 Other long term (current) drug therapy: Secondary | ICD-10-CM | POA: Diagnosis not present

## 2017-07-22 DIAGNOSIS — Z87891 Personal history of nicotine dependence: Secondary | ICD-10-CM | POA: Diagnosis not present

## 2017-07-22 DIAGNOSIS — I10 Essential (primary) hypertension: Secondary | ICD-10-CM | POA: Diagnosis not present

## 2017-07-22 DIAGNOSIS — M5441 Lumbago with sciatica, right side: Secondary | ICD-10-CM | POA: Insufficient documentation

## 2017-07-22 DIAGNOSIS — E785 Hyperlipidemia, unspecified: Secondary | ICD-10-CM | POA: Insufficient documentation

## 2017-07-22 DIAGNOSIS — M545 Low back pain: Secondary | ICD-10-CM | POA: Diagnosis present

## 2017-07-22 DIAGNOSIS — Z7984 Long term (current) use of oral hypoglycemic drugs: Secondary | ICD-10-CM | POA: Diagnosis not present

## 2017-07-22 DIAGNOSIS — G8929 Other chronic pain: Secondary | ICD-10-CM | POA: Diagnosis not present

## 2017-07-22 MED ORDER — CYCLOBENZAPRINE HCL 10 MG PO TABS: 10.0000 mg | ORAL_TABLET | Freq: Two times a day (BID) | ORAL | 0 refills | Status: DC | PRN

## 2017-07-22 MED ORDER — LIDOCAINE 5 % EX PTCH: 1.0000 | MEDICATED_PATCH | CUTANEOUS | 0 refills | Status: DC

## 2017-07-22 MED ORDER — KETOROLAC TROMETHAMINE 60 MG/2ML IM SOLN
60.0000 mg | Freq: Once | INTRAMUSCULAR | Status: AC
  Administered 2017-07-22: 60 mg via INTRAMUSCULAR
  Filled 2017-07-22: qty 2

## 2017-07-22 NOTE — ED Triage Notes (Signed)
Lower back pain x 2 months. Hx of back surgery for herniated disk.

## 2017-07-22 NOTE — Discharge Instructions (Signed)
I have written you a prescription for a muscle relaxer called Flexeril.  Please take this medicine for your lower back pain.  It can make you drowsy, please do not drive or drink alcohol while taking this medication.  I have also written you a prescription for a lidocaine patch which you can apply to your right lower back daily to help with your symptoms. Heat can also help with pain relief.   Please take 600 mg ibuprofen every 6 hours as needed for pain.  Please go to your scheduled appointment with your primary care doctor on Wednesday to follow-up with your lower back pain, particularly if your symptoms are not improved with the treatment we have given you today.  Please also have blood pressure recheck as your blood pressure was elevated in the ER today.  Return to the emergency department if you have fever with back pain, back pain with numbness and weakness in both of your legs making it difficult to walk, back pain in which she lose bladder or bowel control.  You can also return for any new or worsening symptoms.

## 2017-07-22 NOTE — ED Provider Notes (Signed)
[facility] EMERGENCY DEPARTMENT Provider Note   CSN: [id number] Arrival date & time: 07/22/17  1831     History   Chief Complaint Chief Complaint  Patient presents with  . Back Pain    HPI Teresa Grant is a 56 y.o. female.  HPI   Teresa Grant is a 56 year old female with a history of hypertension, hyperlipidemia, type 2 diabetes, lumbar degenerative disc disease (lumbar laminectomy 2013) who presents emergency department for evaluation of right lower back pain.  She states that her current episode of pain has been going on for the past 2 months, no inciting injury.  States her pain is 10/10 in severity, constant and feels "tight and locked in place."  It radiates down the right buttocks and into the right lower leg.  It is worsened with prolonged sitting, ambulation or twisting at the waist.  She has tried massage, Tylenol and a muscle relaxer for her symptoms without significant relief.  She denies fever, weight loss, fatigue, numbness, saddle anesthesia, weakness, loss of bowel or bladder control, urinary retention, abdominal pain, N/V, diarrhea, dysuria, frequency.  No personal history of cancer, denies IV drug use.  No prolonged use of prednisone therapy.  Past Medical History:  Diagnosis Date  . Diabetes mellitus   . Dysrhythmia    1985  . Heart murmur   . HLD (hyperlipidemia)   . Hypertension   . Kidney stone on right side 03/2013  . Pneumonia    7 yrs ago    Patient Active Problem List   Diagnosis Date Noted  . Chest pain 07/10/2014  . Abdominal pain 07/10/2014  . Type II or unspecified type diabetes mellitus without mention of complication, uncontrolled 09/05/2013  . Renal calculus, right 06/12/2013  . Diabetes with proteinuria 11/05/2012  . DDD (degenerative disc disease), lumbar 06/23/2012  . HYPERLIPIDEMIA 11/27/2006  . Essential hypertension 11/27/2006    Past Surgical History:  Procedure Laterality Date  . ABDOMINAL HYSTERECTOMY  2001    . BACK SURGERY    . CESAREAN SECTION  1990  . COLONOSCOPY     Dr. Deatra Ina normal  . LUMBAR LAMINECTOMY/DECOMPRESSION MICRODISCECTOMY  03/20/2012   Procedure: LUMBAR LAMINECTOMY/DECOMPRESSION MICRODISCECTOMY;  Surgeon: Sinclair Ship, MD;  Location: [facility];  Service: Orthopedics;  Laterality: Left;  Left sided lumbar 4-5 microdisectomy  . SPINE SURGERY  2013    OB History    No data available       Home Medications    Prior to Admission medications   Medication Sig Start Date End Date Taking? Authorizing Provider  amLODipine (NORVASC) 10 MG tablet Take 1 tablet (10 mg total) by mouth at bedtime. 01/05/14   Collene Leyden, PA-C  cholecalciferol (VITAMIN D) 1000 units tablet Take 1,000 Units by mouth daily.    [provider]  cyclobenzaprine (FLEXERIL) 10 MG tablet Take 1 tablet (10 mg total) by mouth 2 (two) times daily as needed for muscle spasms. 07/22/17   Glyn Ade, PA-C  lidocaine (LIDODERM) 5 % Place 1 patch onto the skin daily. Remove & Discard patch within 12 hours or as directed by MD 07/22/17   Glyn Ade, PA-C  metFORMIN (GLUCOPHAGE) 1000 MG tablet Take 1 tablet (1,000 mg total) by mouth 2 (two) times daily with a meal. NO MORE REFILLS WITHOUT OFFICE VISIT - 2ND NOTICE 01/20/15   Jaynee Eagles, PA-C  NON FORMULARY 1 packet 2 (two) times daily.    [provider]  pravastatin (PRAVACHOL) 40 MG  tablet Take 40 mg by mouth at bedtime. 05/25/15   [provider]  TRESIBA FLEXTOUCH 200 UNIT/ML SOPN Inject 44-45 Units into the skin at bedtime.  06/07/15   [provider]    Family History Family History  Problem Relation Age of Onset  . Heart disease Mother   . Kidney disease Father   . Esophageal cancer Maternal Uncle   . Colon cancer Cousin   . Stroke Neg Hx   . Cancer Neg Hx   . Colon polyps Neg Hx   . Rectal cancer Neg Hx   . Stomach cancer Neg Hx     Social History Social History  Substance Use Topics  .  Smoking status: Former Smoker    Quit date: 04/29/2004  . Smokeless tobacco: Never Used  . Alcohol use Yes     Comment: occasionally     Allergies   Lisinopril and Hydrochlorothiazide   Review of Systems Review of Systems  Constitutional: Negative for appetite change, fatigue, fever and unexpected weight change.  Gastrointestinal: Negative for abdominal pain, diarrhea, nausea and vomiting.  Genitourinary: Negative for difficulty urinating, dysuria, flank pain and frequency.  Musculoskeletal: Positive for back pain. Negative for gait problem and neck pain.  Skin: Negative for rash and wound.  Neurological: Negative for weakness and numbness.  Psychiatric/Behavioral: Negative for agitation.     Physical Exam Updated Vital Signs BP 123/81 (BP Location: Right Arm)   Pulse 75   Temp 98.4 F (36.9 C) (Oral)   Resp 16   Ht 5\' 1"  (1.549 m)   Wt 78.5 kg (173 lb)   SpO2 97%   BMI 32.69 kg/m   Physical Exam  Constitutional: She is oriented to person, place, and time. She appears well-developed and well-nourished. No distress.  HENT:  Head: Normocephalic and atraumatic.  Eyes: Right eye exhibits no discharge. Left eye exhibits no discharge.  Pulmonary/Chest: Effort normal. No respiratory distress.  Abdominal: Soft. Bowel sounds are normal. She exhibits no distension. There is no tenderness. There is no guarding.  No CVA tenderness.  Musculoskeletal:  Midline surgical scar in the lumbar spine, well healed. Tenderness to palpation over right paraspinal muscles of the lumbar spine. No midline T-spine or L-spine tenderness. Strength 5/5 in bilateral lower legs and ankles. DP pulses 2+ bilaterally.   Neurological: She is alert and oriented to person, place, and time. Coordination normal.  Patellar reflexes 2+ bilaterally. Distal sensation to light touch intact in bilateral LE. Gait normal in coordination and balance.   Skin: Skin is warm and dry. Capillary refill takes less than 2  seconds. She is not diaphoretic.  Psychiatric: She has a normal mood and affect. Her behavior is normal.  Nursing note and vitals reviewed.    ED Treatments / Results  Labs (all labs ordered are listed, but only abnormal results are displayed) Labs Reviewed - No data to display  EKG  EKG Interpretation None       Radiology No results found.  Procedures Procedures (including critical care time)  Medications Ordered in ED Medications  ketorolac (TORADOL) injection 60 mg (not administered)     Initial Impression / Assessment and Plan / ED Course  I have reviewed the triage vital signs and the nursing notes.  Pertinent labs & imaging results that were available during my care of the patient were reviewed by me and considered in my medical decision making (see chart for details).     Patient with back pain. No neurological deficits and  normal neuro exam.  Patient can walk but states is painful. No loss of bowel or bladder control. No concern for cauda equina.  No fever, night sweats, weight loss, h/o cancer, IVDU. RICE protocol and pain medicine indicated and discussed with patient. Have counseled patient to follow up with her primary doctor for further management if symptoms persist despite conservative therapy. Discussed return precautions and patient agrees and voices understanding.    Final Clinical Impressions(s) / ED Diagnoses   Final diagnoses:  Chronic right-sided low back pain with right-sided sciatica    New Prescriptions New Prescriptions   CYCLOBENZAPRINE (FLEXERIL) 10 MG TABLET    Take 1 tablet (10 mg total) by mouth 2 (two) times daily as needed for muscle spasms.   LIDOCAINE (LIDODERM) 5 %    Place 1 patch onto the skin daily. Remove & Discard patch within 12 hours or as directed by MD     Glyn Ade, PA-C 07/23/17 1224    Blanchie Dessert, MD 07/23/17 1954

## 2017-07-22 NOTE — ED Notes (Addendum)
Difficult to obtain from pt acute versus chronic symptoms. H/o back pain and surgery. Last saw Dr. Lynann Bologna (ortho) ~4 years ago. PCP is Dr. Gertie Exon.  Does not wear braces or use assistive walking devices. Also h/o DM and neuropathy.  Alert, NAD, calm, interactive, resps e/u, speaking in clear complete sentences, no dyspnea noted, skin W&D, c/o L lumbar back pain "feels like back gives out intermittently", mentions some numbness in L arm, and intermittent radiation, (denies: urinary symptoms, loss of control of bowel or bladder, fever, sob, nausea, dizziness or visual changes). Family at [facility].  PT up to b/r, slow steady gait.

## 2017-07-30 ENCOUNTER — Encounter: Payer: Self-pay | Admitting: Gastroenterology

## 2017-07-30 ENCOUNTER — Ambulatory Visit (AMBULATORY_SURGERY_CENTER): Payer: BC Managed Care – PPO | Admitting: Gastroenterology

## 2017-07-30 VITALS — BP 138/80 | HR 69 | Temp 97.5°F | Resp 14 | Ht 61.0 in | Wt 173.0 lb

## 2017-07-30 DIAGNOSIS — Z1211 Encounter for screening for malignant neoplasm of colon: Secondary | ICD-10-CM

## 2017-07-30 DIAGNOSIS — D123 Benign neoplasm of transverse colon: Secondary | ICD-10-CM

## 2017-07-30 DIAGNOSIS — Z1212 Encounter for screening for malignant neoplasm of rectum: Secondary | ICD-10-CM

## 2017-07-30 DIAGNOSIS — D122 Benign neoplasm of ascending colon: Secondary | ICD-10-CM | POA: Diagnosis not present

## 2017-07-30 MED ORDER — SODIUM CHLORIDE 0.9 % IV SOLN: 500.0000 mL | INTRAVENOUS | Status: DC

## 2017-07-30 NOTE — Op Note (Signed)
[facility] Patient Name: Teresa Grant Procedure Date: 07/30/2017 10:57 AM MRN: [id number] Endoscopist: Remo Lipps P. Nerine Pulse MD, MD Age: 56 Referring MD:  Date of Birth: September 02, 1961 Gender: Female Account #: [id number] Procedure:                Colonoscopy Indications:              Screening for colorectal malignant neoplasm, This                            is the patient's first colonoscopy Medicines:                Monitored Anesthesia Care Procedure:                Pre-Anesthesia Assessment:                           - Prior to the procedure, a History and Physical                            was performed, and patient medications and                            allergies were reviewed. The patient's tolerance of                            previous anesthesia was also reviewed. The risks                            and benefits of the procedure and the sedation                            options and risks were discussed with the patient.                            All questions were answered, and informed consent                            was obtained. Prior Anticoagulants: The patient has                            taken no previous anticoagulant or antiplatelet                            agents. ASA Grade Assessment: II - A patient with                            mild systemic disease. After reviewing the risks                            and benefits, the patient was deemed in                            satisfactory condition to undergo the procedure.  After obtaining informed consent, the colonoscope                            was passed under direct vision. Throughout the                            procedure, the patient's blood pressure, pulse, and                            oxygen saturations were monitored continuously. The                            Colonoscope was introduced through the anus and                            advanced to the  the cecum, identified by                            appendiceal orifice and ileocecal valve. The                            colonoscopy was performed without difficulty. The                            patient tolerated the procedure well. The quality                            of the bowel preparation was adequate. The                            ileocecal valve, appendiceal orifice, and rectum                            were photographed. Scope In: 11:04:38 AM Scope Out: 11:24:56 AM Scope Withdrawal Time: 0 hours 17 minutes 51 seconds  Total Procedure Duration: 0 hours 20 minutes 18 seconds  Findings:                 The perianal and digital rectal examinations were                            normal.                           A 4 mm polyp was found in the ascending colon. The                            polyp was flat. The polyp was removed with a cold                            snare. Resection and retrieval were complete.                           A 3 mm polyp was found in the transverse colon. The  polyp was sessile. The polyp was removed with a                            cold snare. Resection and retrieval were complete.                           Anal papilla(e) were hypertrophied.                           The exam was otherwise without abnormality. Complications:            No immediate complications. Estimated blood loss:                            Minimal. Estimated Blood Loss:     Estimated blood loss was minimal. Impression:               - One 4 mm polyp in the ascending colon, removed                            with a cold snare. Resected and retrieved.                           - One 3 mm polyp in the transverse colon, removed                            with a cold snare. Resected and retrieved.                           - Anal papilla(e) were hypertrophied.                           - The examination was otherwise normal. Recommendation:            - Patient has a contact number available for                            emergencies. The signs and symptoms of potential                            delayed complications were discussed with the                            patient. Return to normal activities tomorrow.                            Written discharge instructions were provided to the                            patient.                           - Resume previous diet.                           - Continue present medications.                           -  Await pathology results.                           - Repeat colonoscopy is recommended for                            surveillance. The colonoscopy date will be                            determined after pathology results from today's                            exam become available for review.                           - No ibuprofen, naproxen, or other non-steroidal                            anti-inflammatory drugs for 2 weeks after polyp                            removal. Remo Lipps P. Jamauri Kruzel MD, MD 07/30/2017 11:29:36 AM This report has been signed electronically.

## 2017-07-30 NOTE — Progress Notes (Signed)
Pt's states no medical or surgical changes since previsit or office visit. 

## 2017-07-30 NOTE — Progress Notes (Signed)
Called to room to assist during endoscopic procedure.  Patient ID and intended procedure confirmed with present staff. Received instructions for my participation in the procedure from the performing physician.  

## 2017-07-30 NOTE — Patient Instructions (Signed)
Impression/Recommendations:  Polyp handout given to patient.  Resume previous diet. Continue present medications.  Await pathology results.  Repeat colonoscopy recommended for surveillance.  Date to be determined after pathology results reviewed.  No ibuprofen, naproxen, or other NSAID drugs for 2 weeks.   Tylenol only until Nov. 19, 2018.  YOU HAD AN ENDOSCOPIC PROCEDURE TODAY AT [facility]:   Refer to the procedure report that was given to you for any specific questions about what was found during the examination.  If the procedure report does not answer your questions, please call your gastroenterologist to clarify.  If you requested that your care partner not be given the details of your procedure findings, then the procedure report has been included in a sealed envelope for you to review at your convenience later.  YOU SHOULD EXPECT: Some feelings of bloating in the abdomen. Passage of more gas than usual.  Walking can help get rid of the air that was put into your GI tract during the procedure and reduce the bloating. If you had a lower endoscopy (such as a colonoscopy or flexible sigmoidoscopy) you may notice spotting of blood in your stool or on the toilet paper. If you underwent a bowel prep for your procedure, you may not have a normal bowel movement for a few days.  Please Note:  You might notice some irritation and congestion in your nose or some drainage.  This is from the oxygen used during your procedure.  There is no need for concern and it should clear up in a day or so.  SYMPTOMS TO REPORT IMMEDIATELY:   Following lower endoscopy (colonoscopy or flexible sigmoidoscopy):  Excessive amounts of blood in the stool  Significant tenderness or worsening of abdominal pains  Swelling of the abdomen that is new, acute  Fever of 100F or higher  For urgent or emergent issues, a gastroenterologist can be reached at any hour by calling [phone].   DIET:  We  do recommend a small meal at first, but then you may proceed to your regular diet.  Drink plenty of fluids but you should avoid alcoholic beverages for 24 hours.  ACTIVITY:  You should plan to take it easy for the rest of today and you should NOT DRIVE or use heavy machinery until tomorrow (because of the sedation medicines used during the test).    FOLLOW UP: Our staff will call the number listed on your records the next business day following your procedure to check on you and address any questions or concerns that you may have regarding the information given to you following your procedure. If we do not reach you, we will leave a message.  However, if you are feeling well and you are not experiencing any problems, there is no need to return our call.  We will assume that you have returned to your regular daily activities without incident.  If any biopsies were taken you will be contacted by phone or by letter within the next 1-3 weeks.  Please call us at [phone] if you have not heard about the biopsies in 3 weeks.    SIGNATURES/CONFIDENTIALITY: You and/or your care partner have signed paperwork which will be entered into your electronic medical record.  These signatures attest to the fact that that the information above on your After Visit Summary has been reviewed and is understood.  Full responsibility of the confidentiality of this discharge information lies with you and/or your care-partner.

## 2017-07-31 ENCOUNTER — Telehealth: Payer: Self-pay

## 2017-07-31 ENCOUNTER — Telehealth: Payer: Self-pay | Admitting: Gastroenterology

## 2017-07-31 NOTE — Telephone Encounter (Signed)
  Follow up Call-  Call back number 07/30/2017  Post procedure Call Back phone  # [phone]  Permission to leave phone message Yes  Some recent data might be hidden     Patient questions:  Do you have a fever, pain , or abdominal swelling? Yes Pain Score  3*  Have you tolerated food without any problems? Not really  Have you been able to return to your normal activities? Yes.    Do you have any questions about your discharge instructions: Diet   No. Medications  No. Follow up visit  No.  Do you have questions or concerns about your Care? No.  Actions: * If pain score is 4 or above: No action needed, pain <4.  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 She agreed to call me back on her break around 9:00am.

## 2017-07-31 NOTE — Telephone Encounter (Signed)
  Follow up Call-  Call back number 07/30/2017  Post procedure Call Back phone  # [phone]  Permission to leave phone message Yes  Some recent data might be hidden     Left message

## 2017-07-31 NOTE — Telephone Encounter (Signed)
Patient called back after having left her a message about her procedure yesterday. This morning she was complaining of gas pains but said that they are easing up now and its not near as bad as it was. I shared some techniques with her on how to pass the air along with maybe taking some form of gas ex. She understood and had no further questions or concerns.

## 2017-07-31 NOTE — Telephone Encounter (Signed)
Routed to [facility] endoscopy follow up nurse.

## 2017-08-03 ENCOUNTER — Encounter: Payer: Self-pay | Admitting: Gastroenterology

## 2017-09-28 ENCOUNTER — Other Ambulatory Visit: Payer: Self-pay | Admitting: Internal Medicine

## 2017-09-28 DIAGNOSIS — Z1231 Encounter for screening mammogram for malignant neoplasm of breast: Secondary | ICD-10-CM

## 2017-10-18 ENCOUNTER — Ambulatory Visit
Admission: RE | Admit: 2017-10-18 | Discharge: 2017-10-18 | Disposition: A | Discharge status: Home / Self Care | Payer: BC Managed Care – PPO | Source: Ambulatory Visit | Attending: Internal Medicine | Admitting: Internal Medicine

## 2017-10-18 DIAGNOSIS — Z1231 Encounter for screening mammogram for malignant neoplasm of breast: Secondary | ICD-10-CM

## 2017-12-07 ENCOUNTER — Encounter (HOSPITAL_COMMUNITY): Payer: Self-pay | Admitting: Emergency Medicine

## 2017-12-07 ENCOUNTER — Other Ambulatory Visit: Payer: Self-pay

## 2017-12-07 ENCOUNTER — Emergency Department (HOSPITAL_COMMUNITY)
Admission: EM | Admit: 2017-12-07 | Discharge: 2017-12-07 | Disposition: A | Discharge status: Home / Self Care | Payer: BC Managed Care – PPO | Attending: Emergency Medicine | Admitting: Emergency Medicine

## 2017-12-07 DIAGNOSIS — M5431 Sciatica, right side: Secondary | ICD-10-CM | POA: Diagnosis not present

## 2017-12-07 DIAGNOSIS — I10 Essential (primary) hypertension: Secondary | ICD-10-CM | POA: Insufficient documentation

## 2017-12-07 DIAGNOSIS — E119 Type 2 diabetes mellitus without complications: Secondary | ICD-10-CM | POA: Insufficient documentation

## 2017-12-07 DIAGNOSIS — Z8679 Personal history of other diseases of the circulatory system: Secondary | ICD-10-CM | POA: Insufficient documentation

## 2017-12-07 DIAGNOSIS — M545 Low back pain: Secondary | ICD-10-CM | POA: Diagnosis present

## 2017-12-07 DIAGNOSIS — Z87891 Personal history of nicotine dependence: Secondary | ICD-10-CM | POA: Insufficient documentation

## 2017-12-07 DIAGNOSIS — Z79899 Other long term (current) drug therapy: Secondary | ICD-10-CM | POA: Diagnosis not present

## 2017-12-07 DIAGNOSIS — Z7984 Long term (current) use of oral hypoglycemic drugs: Secondary | ICD-10-CM | POA: Diagnosis not present

## 2017-12-07 LAB — I-STAT CHEM 8, ED
BUN: 13 mg/dL (ref 6–20)
Calcium, Ion: 1.19 mmol/L (ref 1.15–1.40)
Chloride: 107 mmol/L (ref 101–111)
Creatinine, Ser: 0.8 mg/dL (ref 0.44–1.00)
Glucose, Bld: 79 mg/dL (ref 65–99)
HCT: 36 % (ref 36.0–46.0)
Hemoglobin: 12.2 g/dL (ref 12.0–15.0)
Potassium: 3.9 mmol/L (ref 3.5–5.1)
Sodium: 144 mmol/L (ref 135–145)
TCO2: 24 mmol/L (ref 22–32)

## 2017-12-07 MED ORDER — NAPROXEN 500 MG PO TABS: 500.0000 mg | ORAL_TABLET | Freq: Two times a day (BID) | ORAL | 0 refills | Status: DC

## 2017-12-07 MED ORDER — METHOCARBAMOL 500 MG PO TABS: 500.0000 mg | ORAL_TABLET | Freq: Two times a day (BID) | ORAL | 0 refills | Status: DC

## 2017-12-07 MED ORDER — METHOCARBAMOL 500 MG PO TABS
500.0000 mg | ORAL_TABLET | Freq: Once | ORAL | Status: AC
  Administered 2017-12-07: 500 mg via ORAL
  Filled 2017-12-07: qty 1

## 2017-12-07 MED ORDER — NAPROXEN 500 MG PO TABS
500.0000 mg | ORAL_TABLET | Freq: Once | ORAL | Status: AC
  Administered 2017-12-07: 500 mg via ORAL
  Filled 2017-12-07: qty 1

## 2017-12-07 NOTE — ED Notes (Signed)
ED Provider at bedside. 

## 2017-12-07 NOTE — ED Triage Notes (Signed)
Pt states she has chronic back pain but yesterday the pain in her left lower back got worse  Pt says the pain radiates down her right leg  Pt states this morning she got up about 0130 and the pain was so bad she could hardly move  Pt has hx of back surgery in the past

## 2017-12-07 NOTE — Discharge Instructions (Signed)
Take naproxen 2 times a day with meals.  Do not take other anti-inflammatories at the same time open (Advil, Motrin, ibuprofen, Aleve). You may supplement with 1000 mg of Tylenol 3 times a day if you need further pain control. Use robaxin as needed for muscle stiffness or soreness.  Have caution, this may make you tired or groggy.  Do not drive or operate heavy machinery while taking this medicine. Do not take flexeril at the same time.  Use heating pads to help control your pain. Follow-up with primary care or orthopedics for further management. Return to the emergency room if you develop numbness, loss of bowel or bladder control, fevers, or any new or worsening symptoms.

## 2017-12-07 NOTE — ED Provider Notes (Signed)
[facility] DEPT Provider Note   CSN: [id number] Arrival date & time: 12/07/17  4098     History   Chief Complaint Chief Complaint  Patient presents with  . Back Pain    HPI Teresa Grant is a 57 y.o. female presenting for evaluation of back pain.  She states that she has had several months of low back pain, worse in the right side.  Yesterday, she was sleeping her house, and this morning she had worsening of her right lower back pain.  It is described as a catching pain that grabs her movement go.  Pain radiates down the back of her right leg.  She denies fall, trauma, or injury.  She denies red flags of back pain including fever, chills, trauma, numbness, tingling, loss of bowel or bladder control, history of cancer, history of IV drug use.  Patient states that she had surgery on her back for 5 years ago at [facility], but has not been back since.  She has not discussed this with her primary care.  She has not taken anything for pain including Tylenol or ibuprofen.  Movements make the pain worse, nothing makes it better.  She has used muscle relaxers in the past, but has not taken any since January.  She has a history of diabetes, blood sugars have been fluctuating recently.  HPI  Past Medical History:  Diagnosis Date  . Diabetes mellitus   . Dysrhythmia    1985  . Heart murmur   . HLD (hyperlipidemia)   . Hypertension   . Kidney stone on right side 03/2013  . Pneumonia    7 yrs ago    Patient Active Problem List   Diagnosis Date Noted  . Chest pain 07/10/2014  . Abdominal pain 07/10/2014  . Type II or unspecified type diabetes mellitus without mention of complication, uncontrolled 09/05/2013  . Renal calculus, right 06/12/2013  . Diabetes with proteinuria 11/05/2012  . DDD (degenerative disc disease), lumbar 06/23/2012  . HYPERLIPIDEMIA 11/27/2006  . Essential hypertension 11/27/2006    Past Surgical History:  Procedure  Laterality Date  . ABDOMINAL HYSTERECTOMY  2001  . BACK SURGERY    . CESAREAN SECTION  1990  . COLONOSCOPY     Dr. Deatra Ina normal  . LUMBAR LAMINECTOMY/DECOMPRESSION MICRODISCECTOMY  03/20/2012   Procedure: LUMBAR LAMINECTOMY/DECOMPRESSION MICRODISCECTOMY;  Surgeon: Sinclair Ship, MD;  Location: [facility];  Service: Orthopedics;  Laterality: Left;  Left sided lumbar 4-5 microdisectomy  . SPINE SURGERY  2013    OB History    No data available       Home Medications    Prior to Admission medications   Medication Sig Start Date End Date Taking? Authorizing Provider  amLODipine (NORVASC) 10 MG tablet Take 1 tablet (10 mg total) by mouth at bedtime. 01/05/14   Collene Leyden, PA-C  cholecalciferol (VITAMIN D) 1000 units tablet Take 1,000 Units by mouth daily.    [provider]  cyclobenzaprine (FLEXERIL) 10 MG tablet Take 1 tablet (10 mg total) by mouth 2 (two) times daily as needed for muscle spasms. 07/22/17   Glyn Ade, PA-C  lidocaine (LIDODERM) 5 % Place 1 patch onto the skin daily. Remove & Discard patch within 12 hours or as directed by MD 07/22/17   Glyn Ade, PA-C  metFORMIN (GLUCOPHAGE) 1000 MG tablet Take 1 tablet (1,000 mg total) by mouth 2 (two) times daily with a meal. NO MORE REFILLS WITHOUT OFFICE VISIT -  2ND NOTICE 01/20/15   Jaynee Eagles, PA-C  methocarbamol (ROBAXIN) 500 MG tablet Take 1 tablet (500 mg total) by mouth 2 (two) times daily. 12/07/17   Jahmil Macleod, PA-C  naproxen (NAPROSYN) 500 MG tablet Take 1 tablet (500 mg total) by mouth 2 (two) times daily with a meal. 12/07/17   Chrisma Hurlock, PA-C  NON FORMULARY 1 packet 2 (two) times daily.    [provider]  pravastatin (PRAVACHOL) 40 MG tablet Take 40 mg by mouth at bedtime. 05/25/15   [provider]  TRESIBA FLEXTOUCH 200 UNIT/ML SOPN Inject 44-45 Units into the skin at bedtime.  06/07/15   [provider]    Family History Family History  Problem  Relation Age of Onset  . Heart disease Mother   . Kidney disease Father   . Esophageal cancer Maternal Uncle   . Colon cancer Cousin   . Stroke Neg Hx   . Cancer Neg Hx   . Colon polyps Neg Hx   . Rectal cancer Neg Hx   . Stomach cancer Neg Hx     Social History Social History   Tobacco Use  . Smoking status: Former Smoker    Last attempt to quit: 04/29/2004    Years since quitting: 13.6  . Smokeless tobacco: Never Used  Substance Use Topics  . Alcohol use: No    Frequency: Never  . Drug use: No     Allergies   Lisinopril and Hydrochlorothiazide   Review of Systems Review of Systems  Musculoskeletal: Positive for back pain.  Skin: Negative for wound.  Neurological: Negative for numbness.     Physical Exam Updated Vital Signs BP (!) 142/86 (BP Location: Left Arm)   Pulse 84   Temp 98.1 F (36.7 C) (Oral)   Resp 16   SpO2 99%   Physical Exam  Constitutional: She is oriented to person, place, and time. She appears well-developed and well-nourished. No distress.  HENT:  Head: Normocephalic and atraumatic.  Eyes: EOM are normal.  Neck: Normal range of motion.  Cardiovascular: Normal rate, regular rhythm and intact distal pulses.  Pulmonary/Chest: Effort normal and breath sounds normal. No respiratory distress. She has no wheezes.  Abdominal: Soft. She exhibits no distension. There is no tenderness.  Musculoskeletal: She exhibits tenderness.  Tenderness to palpation of low right-sided back musculature and right buttock.  No increased pain over midline spine.  No pain on the left side.  Radiation of pain past the knee with straight leg raise on the right side.  Pedal pulses intact bilaterally.  Sensation intact bilaterally.  Strength equal bilaterally.  Patellar reflexes equal bilaterally.  Soft compartments.  Patient ambulatory with pain.  No tenderness palpation elsewhere on the back or cspine.  Neurological: She is alert and oriented to person, place, and time.  She displays normal reflexes. No sensory deficit.  Skin: Skin is warm. No rash noted.  Psychiatric: She has a normal mood and affect.  Nursing note and vitals reviewed.    ED Treatments / Results  Labs (all labs ordered are listed, but only abnormal results are displayed) Labs Reviewed  I-STAT CHEM 8, ED    EKG  EKG Interpretation None       Radiology No results found.  Procedures Procedures (including critical care time)  Medications Ordered in ED Medications  naproxen (NAPROSYN) tablet 500 mg (500 mg Oral Given 12/07/17 0904)  methocarbamol (ROBAXIN) tablet 500 mg (500 mg Oral Given 12/07/17 0904)     Initial  Impression / Assessment and Plan / ED Course  I have reviewed the triage vital signs and the nursing notes.  Pertinent labs & imaging results that were available during my care of the patient were reviewed by me and considered in my medical decision making (see chart for details).     Patient presenting for evaluation of back pain.  Physical exam shows no neurologic deficits.  History and symptoms consistent with sciatica.  No red flags of back pain.  Discussed findings with patient.  Discussed treatment with anti-inflammatories and muscle relaxers.  Will avoid prednisone as patient has history of diabetes.  Patient states her primary care doctor is working her for possible kidney injury, no labs seen on epic.  Will obtain chem 8 to ensure kidney function is ok prior to giving NSAIDs.  Kidney function non-concerning.  Patient given prescription for naproxen and Robaxin.  Follow-up with primary care and/or orthopedics encouraged.  At this time, patient appears safe for discharge.  Return precautions given.  Patient states she understands and agrees to plan.   Final Clinical Impressions(s) / ED Diagnoses   Final diagnoses:  Sciatica of right side    ED Discharge Orders        Ordered    naproxen (NAPROSYN) 500 MG tablet  2 times daily with meals     12/07/17  0844    methocarbamol (ROBAXIN) 500 MG tablet  2 times daily     12/07/17 0844       Franchot Heidelberg, PA-C 12/07/17 1051    Jola Schmidt, MD 12/07/17 1625

## 2018-01-23 ENCOUNTER — Other Ambulatory Visit: Payer: Self-pay | Admitting: Orthopedic Surgery

## 2018-01-23 DIAGNOSIS — M533 Sacrococcygeal disorders, not elsewhere classified: Secondary | ICD-10-CM

## 2018-02-06 ENCOUNTER — Ambulatory Visit
Admission: RE | Admit: 2018-02-06 | Discharge: 2018-02-06 | Disposition: A | Discharge status: Home / Self Care | Payer: BC Managed Care – PPO | Source: Ambulatory Visit | Attending: Orthopedic Surgery | Admitting: Orthopedic Surgery

## 2018-02-06 DIAGNOSIS — M533 Sacrococcygeal disorders, not elsewhere classified: Secondary | ICD-10-CM

## 2018-04-01 IMAGING — MG DIGITAL SCREENING BILATERAL MAMMOGRAM WITH CAD
4 series · 4 of 4 positions shown · non-contrast
Comparison: Previous exam(s).

CLINICAL DATA: Screening.

EXAM:
DIGITAL SCREENING BILATERAL MAMMOGRAM WITH CAD

[L MLO]
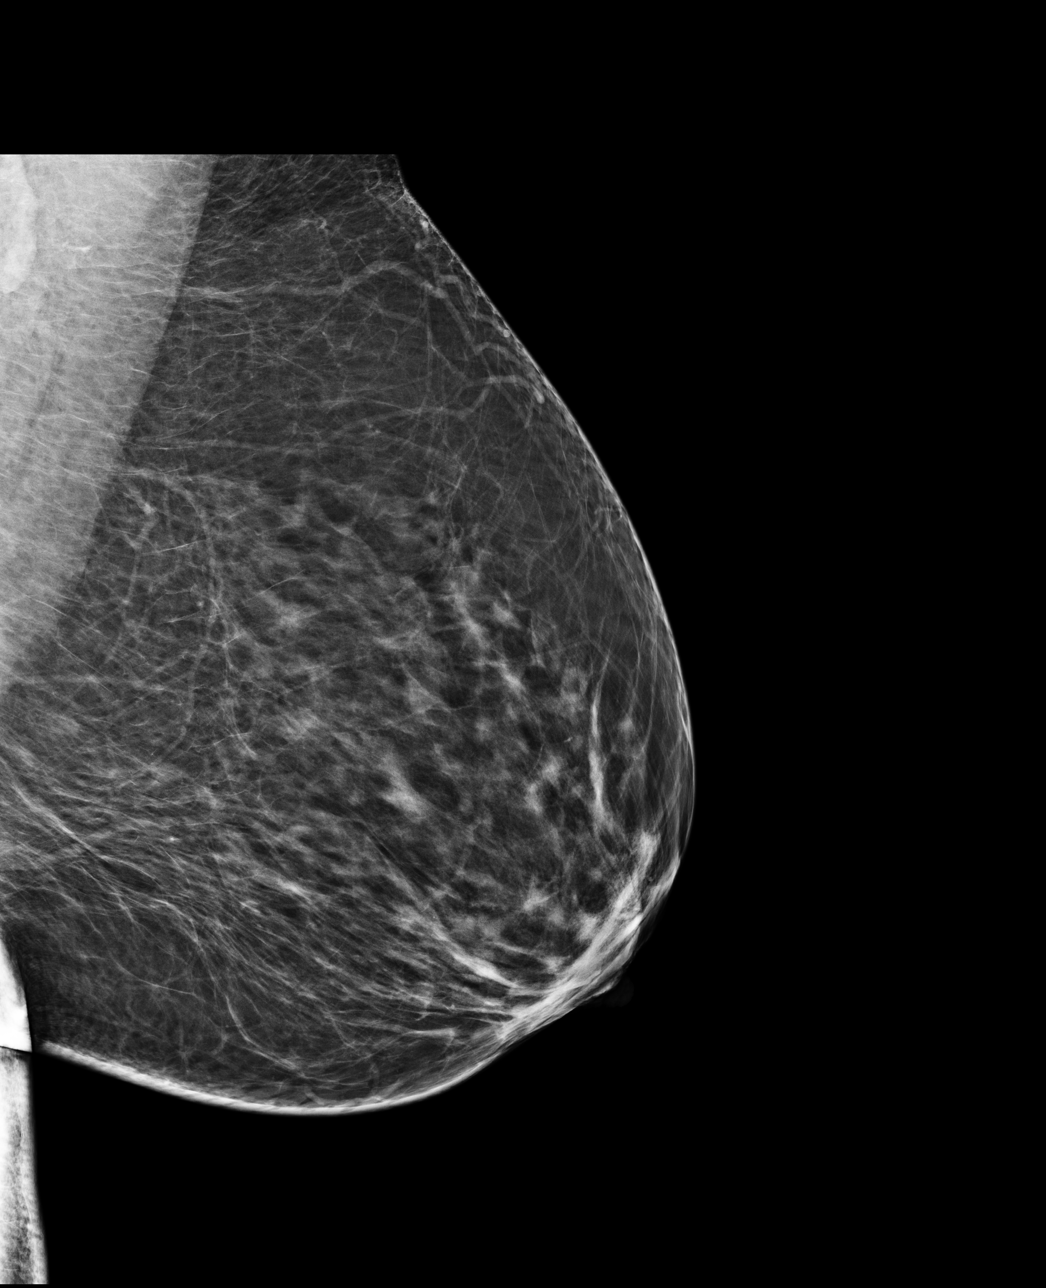

[L CC]
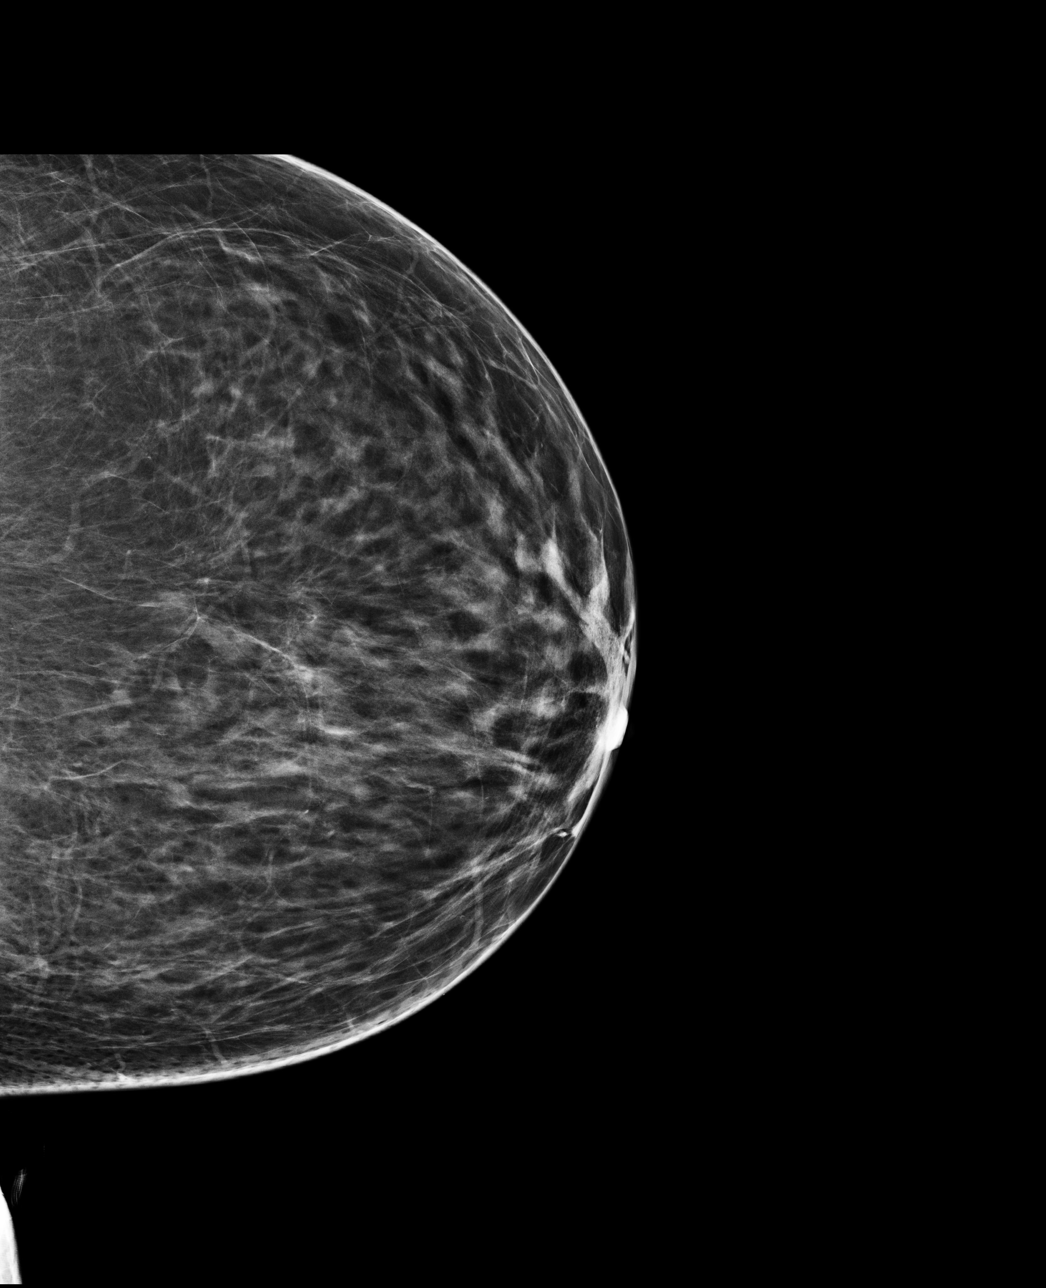

[R CC]
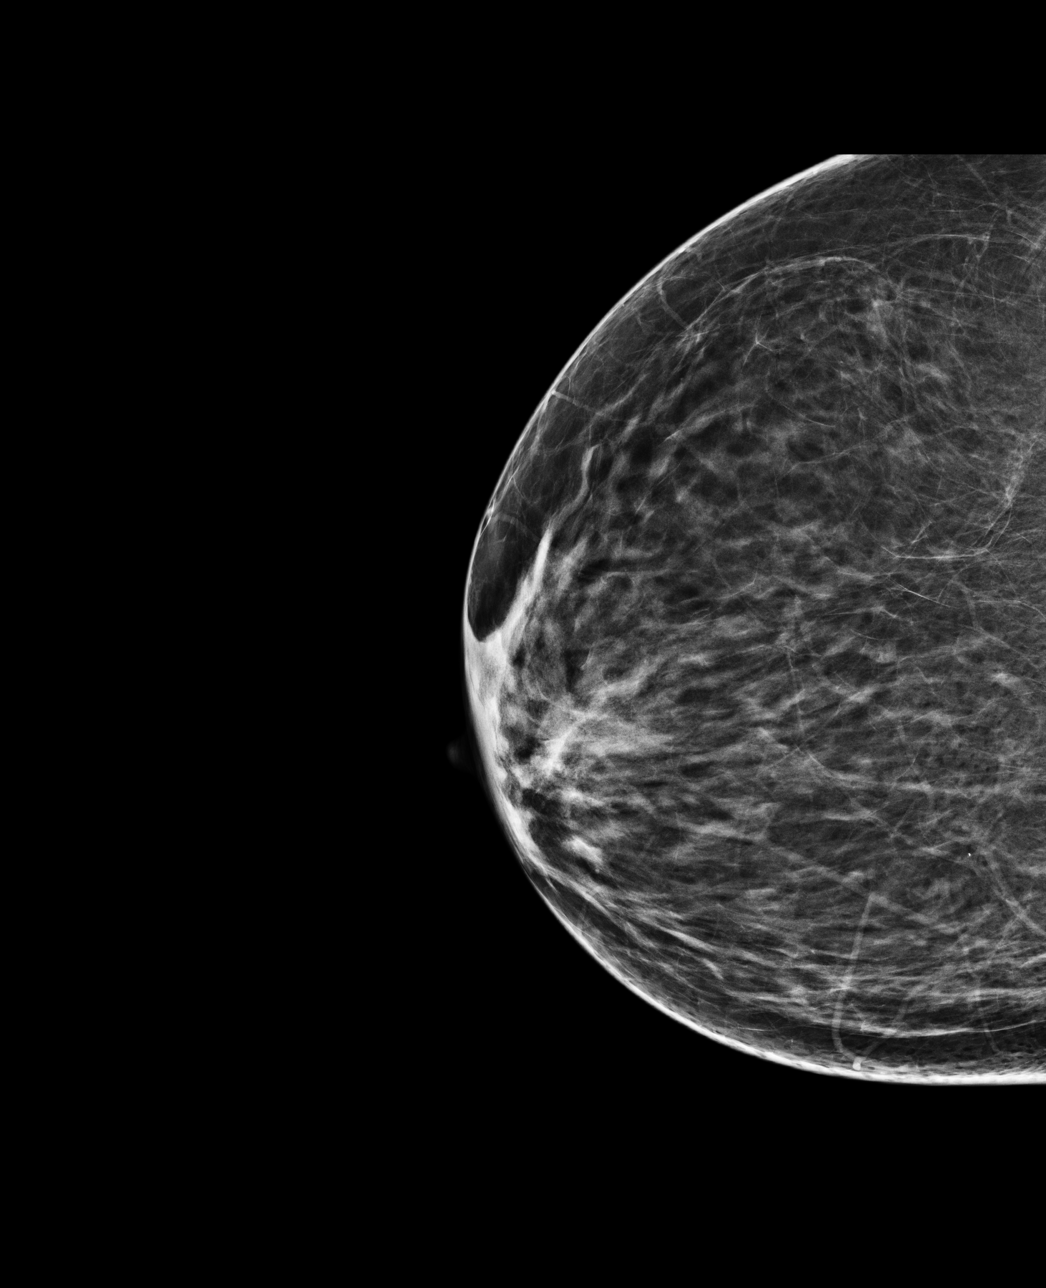

[R MLO]
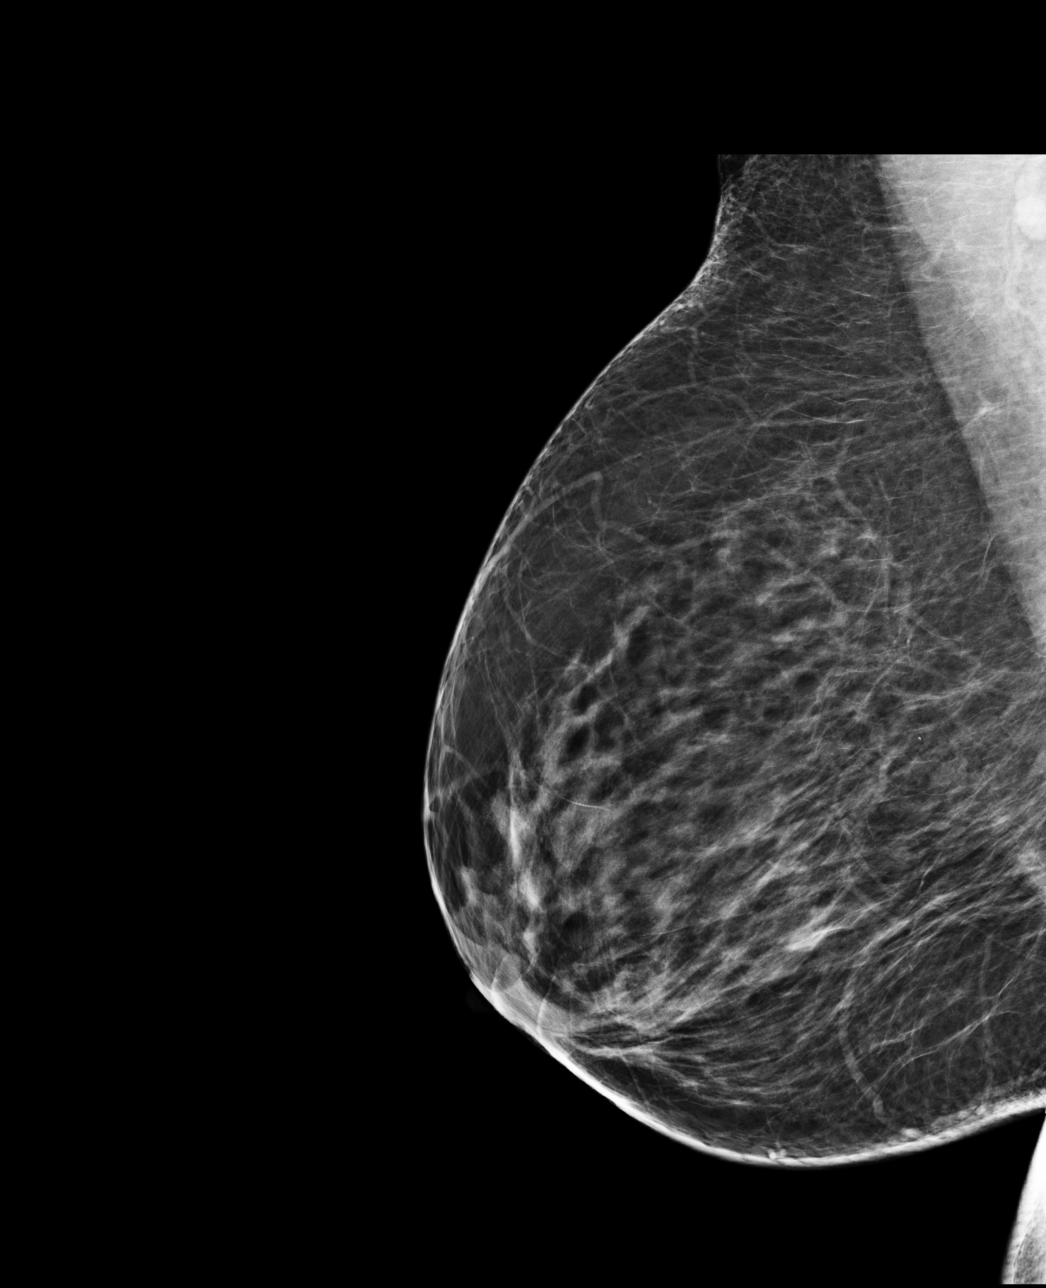

[4 of 4 positions shown; findings below may reference images not displayed]

ACR Breast Density Category b: There are scattered areas of
fibroglandular density.
FINDINGS: There are no findings suspicious for malignancy. Images were
processed with CAD.
IMPRESSION: No mammographic evidence of malignancy. A result letter of this
screening mammogram will be mailed directly to the patient.

RECOMMENDATION:
Screening mammogram in one year. (Code:AS-G-LCT)

BI-RADS CATEGORY  1: Negative.

## 2018-06-06 DIAGNOSIS — E559 Vitamin D deficiency, unspecified: Secondary | ICD-10-CM

## 2018-06-06 DIAGNOSIS — M542 Cervicalgia: Secondary | ICD-10-CM

## 2018-06-06 DIAGNOSIS — Z23 Encounter for immunization: Secondary | ICD-10-CM

## 2018-06-06 DIAGNOSIS — I10 Essential (primary) hypertension: Secondary | ICD-10-CM

## 2018-06-06 DIAGNOSIS — E1165 Type 2 diabetes mellitus with hyperglycemia: Secondary | ICD-10-CM

## 2018-06-28 ENCOUNTER — Telehealth: Payer: Self-pay

## 2018-06-28 NOTE — Telephone Encounter (Signed)
Left the pt a message that Dr. Baird Cancer said to stop the Gipizide because the pt's blood sugars has been running low and down in the 30s.

## 2018-07-04 ENCOUNTER — Telehealth: Payer: Self-pay

## 2018-07-04 NOTE — Telephone Encounter (Signed)
The patient was notified that Dr. Baird Cancer said to decrease her insulin by 2 units because the pt said that her sugars are still dropping and that it was 60 this morning,  The pt confirmed that she will now be on 48 units of tresiba.

## 2018-07-20 LAB — HM DIABETES EYE EXAM

## 2018-07-24 ENCOUNTER — Other Ambulatory Visit: Payer: Self-pay | Admitting: Internal Medicine

## 2018-07-31 ENCOUNTER — Other Ambulatory Visit: Payer: Self-pay

## 2018-07-31 MED ORDER — SEMAGLUTIDE(0.25 OR 0.5MG/DOS) 2 MG/1.5ML ~~LOC~~ SOPN: 0.5000 mg | PEN_INJECTOR | SUBCUTANEOUS | 1 refills | Status: DC

## 2018-08-06 ENCOUNTER — Ambulatory Visit: Payer: BC Managed Care – PPO | Admitting: Internal Medicine

## 2018-08-06 ENCOUNTER — Encounter: Payer: Self-pay | Admitting: Internal Medicine

## 2018-08-06 VITALS — BP 118/76 | HR 87 | Temp 97.8°F | Ht 61.0 in | Wt 171.4 lb

## 2018-08-06 DIAGNOSIS — E6609 Other obesity due to excess calories: Secondary | ICD-10-CM | POA: Diagnosis not present

## 2018-08-06 DIAGNOSIS — I10 Essential (primary) hypertension: Secondary | ICD-10-CM | POA: Diagnosis not present

## 2018-08-06 DIAGNOSIS — E1165 Type 2 diabetes mellitus with hyperglycemia: Secondary | ICD-10-CM | POA: Diagnosis not present

## 2018-08-06 DIAGNOSIS — Z6832 Body mass index (BMI) 32.0-32.9, adult: Secondary | ICD-10-CM

## 2018-08-06 DIAGNOSIS — M62830 Muscle spasm of back: Secondary | ICD-10-CM

## 2018-08-06 LAB — BMP8+EGFR
BUN/Creatinine Ratio: 12 (ref 9–23)
BUN: 12 mg/dL (ref 6–24)
CO2: 20 mmol/L (ref 20–29)
Calcium: 10.2 mg/dL (ref 8.7–10.2)
Chloride: 107 mmol/L — ABNORMAL HIGH (ref 96–106)
Creatinine, Ser: 0.98 mg/dL (ref 0.57–1.00)
GFR calc Af Amer: 74 mL/min/{1.73_m2} (ref >59)
GFR calc non Af Amer: 64 mL/min/{1.73_m2} (ref >59)
Glucose: 95 mg/dL (ref 65–99)
Potassium: 4.1 mmol/L (ref 3.5–5.2)
Sodium: 142 mmol/L (ref 134–144)

## 2018-08-06 LAB — HEMOGLOBIN A1C
Est. average glucose Bld gHb Est-mCnc: 157 mg/dL
Hgb A1c MFr Bld: 7.1 % — ABNORMAL HIGH (ref 4.8–5.6)

## 2018-08-06 NOTE — Patient Instructions (Signed)

## 2018-08-06 NOTE — Progress Notes (Signed)
Hello,   Here are your lab results:  Your liver and kidney function are stable. Your a1c is 7.1, this is AWESOME!!!!! I am so proud of  You!

## 2018-08-06 NOTE — Progress Notes (Signed)
Subjective:     Patient ID: Teresa Grant , female    DOB: [birth year] , 57 y.o.   MRN: [id number]   Chief Complaint  Patient presents with  . Diabetes  . Hypertension    HPI  Diabetes  She presents for her follow-up diabetic visit. She has type 2 diabetes mellitus. Her disease course has been stable. There are no hypoglycemic associated symptoms. Pertinent negatives for diabetes include no chest pain and no fatigue. There are no hypoglycemic complications. Symptoms are improving. Risk factors for coronary artery disease include diabetes mellitus, dyslipidemia, hypertension and sedentary lifestyle.  Hypertension  This is a chronic problem. The current episode started more than 1 year ago. Pertinent negatives include no chest pain.   She reports compliance with meds.   Past Medical History:  Diagnosis Date  . Diabetes mellitus   . Dysrhythmia    1985  . Heart murmur   . HLD (hyperlipidemia)   . Hypertension   . Kidney stone on right side 03/2013  . Pneumonia    7 yrs ago     Family History  Problem Relation Age of Onset  . Heart disease Mother   . Kidney disease Father   . Esophageal cancer Maternal Uncle   . Colon cancer Cousin   . Stroke Neg Hx   . Cancer Neg Hx   . Colon polyps Neg Hx   . Rectal cancer Neg Hx   . Stomach cancer Neg Hx      Current Outpatient Medications:  .  amLODipine (NORVASC) 10 MG tablet, Take 1 tablet (10 mg total) by mouth at bedtime., Disp: 30 tablet, Rfl: 11 .  metFORMIN (GLUCOPHAGE) 1000 MG tablet, TAKE ONE TABLET BY MOUTH TWICE DAILY WITH MORNING AND EVENING MEALS., Disp: 180 tablet, Rfl: 0 .  pravastatin (PRAVACHOL) 40 MG tablet, Take 40 mg by mouth at bedtime., Disp: , Rfl:  .  Semaglutide,0.25 or 0.5MG /DOS, (OZEMPIC, 0.25 OR 0.5 MG/DOSE,) 2 MG/1.5ML SOPN, Inject 0.5 mg into the skin once a week., Disp: 4 pen, Rfl: 1 .  TRESIBA FLEXTOUCH 200 UNIT/ML SOPN, Inject 48 Units into the skin at bedtime. , Disp: , Rfl:  .  ONETOUCH VERIO  test strip, , Disp: , Rfl:  .  telmisartan (MICARDIS) 20 MG tablet, , Disp: , Rfl:    Allergies  Allergen Reactions  . Lisinopril Cough  . Hydrochlorothiazide Other (See Comments)    pancreatitis     Review of Systems  Constitutional: Negative.  Negative for fatigue.  Respiratory: Negative.   Cardiovascular: Negative.  Negative for chest pain.  Musculoskeletal: Positive for back pain (she is followed by chiropractor) and myalgias (she c/o muscle spasms).  Neurological: Negative.   Psychiatric/Behavioral: Negative.      Today's Vitals   08/06/18 0941  BP: 118/76  Pulse: 87  Temp: 97.8 F (36.6 C)  TempSrc: Oral  Weight: 171 lb 6.4 oz (77.7 kg)  Height: 5\' 1"  (1.549 m)   Body mass index is 32.39 kg/m.   Objective:  Physical Exam  Constitutional: She is oriented to person, place, and time. She appears well-developed and well-nourished.  HENT:  Head: Normocephalic and atraumatic.  Cardiovascular: Normal rate, regular rhythm and normal heart sounds.  Pulmonary/Chest: Effort normal and breath sounds normal.  Neurological: She is alert and oriented to person, place, and time.  Psychiatric: She has a normal mood and affect.  Nursing note and vitals reviewed.       Assessment And Plan:  1. Uncontrolled type 2 diabetes mellitus with hyperglycemia (HCC)  Her sugars have greatly improved over the past several months. With addition of Ozempic, I have been able to take her off of glipizide. I will check hba1c today. I plan to increase her Ozempic to 1mg  once weekly. Patient advised that I will also continue to decrease [place] as her sugars improve.   2. Essential hypertension, benign  Well controlled. She will continue with current meds. She is encouraged to avoid adding salt to her foods.   3. Back muscle spasm  She is encouraged to start magnesium 250mg  nightly. She is advised she can get this over the counter.   4. Class 1 obesity due to excess calories with  serious comorbidity and body mass index (BMI) of 32.0 to 32.9 in adult  She is encouraged to strive for BMI less than 28 to decrease cardiac risk.   [name], MD

## 2018-08-27 ENCOUNTER — Other Ambulatory Visit: Payer: Self-pay | Admitting: Internal Medicine

## 2018-09-11 ENCOUNTER — Other Ambulatory Visit: Payer: Self-pay | Admitting: Internal Medicine

## 2018-09-11 ENCOUNTER — Telehealth: Payer: Self-pay | Admitting: Internal Medicine

## 2018-09-11 NOTE — Telephone Encounter (Signed)
PT CALLED REQ REFILL OF TRESIBA. PT MAY NEED TO BE CONTACTED STATED THAT SHE USES [facility] IN [place], I SEE [facility] ON CHART

## 2018-09-12 ENCOUNTER — Other Ambulatory Visit: Payer: Self-pay

## 2018-09-13 ENCOUNTER — Other Ambulatory Visit: Payer: Self-pay | Admitting: Internal Medicine

## 2018-10-02 ENCOUNTER — Telehealth: Payer: Self-pay

## 2018-10-02 NOTE — Telephone Encounter (Signed)
Left the pt a message that her DMV forms has been faxed.

## 2018-10-16 ENCOUNTER — Encounter: Payer: Self-pay | Admitting: Internal Medicine

## 2018-10-16 ENCOUNTER — Ambulatory Visit: Payer: BC Managed Care – PPO | Admitting: Internal Medicine

## 2018-10-16 VITALS — BP 114/76 | HR 79 | Temp 97.7°F | Ht 59.5 in | Wt 172.2 lb

## 2018-10-16 DIAGNOSIS — E1165 Type 2 diabetes mellitus with hyperglycemia: Secondary | ICD-10-CM | POA: Diagnosis not present

## 2018-10-16 DIAGNOSIS — M898X1 Other specified disorders of bone, shoulder: Secondary | ICD-10-CM

## 2018-10-16 DIAGNOSIS — M542 Cervicalgia: Secondary | ICD-10-CM

## 2018-10-16 DIAGNOSIS — Z0000 Encounter for general adult medical examination without abnormal findings: Secondary | ICD-10-CM | POA: Diagnosis not present

## 2018-10-16 DIAGNOSIS — I10 Essential (primary) hypertension: Secondary | ICD-10-CM | POA: Diagnosis not present

## 2018-10-16 LAB — POCT URINALYSIS DIPSTICK
Bilirubin, UA: NEGATIVE
Glucose, UA: NEGATIVE
Ketones, UA: NEGATIVE
Leukocytes, UA: NEGATIVE
Nitrite, UA: NEGATIVE
Protein, UA: POSITIVE — AB
Spec Grav, UA: 1.025 (ref 1.010–1.025)
Urobilinogen, UA: 0.2 E.U./dL
pH, UA: 5.5 (ref 5.0–8.0)

## 2018-10-16 LAB — POCT UA - MICROALBUMIN
Creatinine, POC: 300 mg/dL
Microalbumin Ur, POC: 150 mg/L

## 2018-10-16 NOTE — Progress Notes (Signed)
Subjective:     Patient ID: Teresa Grant , female    DOB: [birth year] , 58 y.o.   MRN: [id number]   Chief Complaint  Patient presents with  . Annual Exam  . Diabetes  . Hypertension    HPI  She is here today for a full physical exam. She is no longer followed by GYN. She was previously seen by Dr. Raphael Gibney. She reports she was advised that she no longer needed Gyn exams since she had a hysterectomy. She does not wish to have a pelvic exam today.   Diabetes  She presents for her follow-up diabetic visit. She has type 2 diabetes mellitus. Her disease course has been improving. There are no hypoglycemic associated symptoms. Pertinent negatives for diabetes include no blurred vision and no chest pain. There are no hypoglycemic complications. Risk factors for coronary artery disease include diabetes mellitus, dyslipidemia, hypertension, post-menopausal, sedentary lifestyle and obesity. She has not had a previous visit with a dietitian. She rarely participates in exercise. She does not see a podiatrist.Eye exam is current.  Hypertension  This is a chronic problem. The current episode started more than 1 year ago. The problem has been gradually improving since onset. Associated symptoms include neck pain (she c/o neck pain. unable to determine her triggers. denies UE weakness. sometimes has UE tingling/numbness. Unable to determine her triggers. ). Pertinent negatives include no blurred vision, chest pain, palpitations or shortness of breath.   She reports compliance with meds.   Past Medical History:  Diagnosis Date  . Diabetes mellitus   . Dysrhythmia    1985  . Heart murmur   . HLD (hyperlipidemia)   . Hypertension   . Kidney stone on right side 03/2013  . Pneumonia    7 yrs ago     Family History  Problem Relation Age of Onset  . Heart disease Mother   . Kidney disease Father   . Esophageal cancer Maternal Uncle   . Colon cancer Cousin   . Stroke Neg Hx   . Cancer Neg Hx    . Colon polyps Neg Hx   . Rectal cancer Neg Hx   . Stomach cancer Neg Hx      Current Outpatient Medications:  .  amLODipine (NORVASC) 10 MG tablet, Take 1 tablet (10 mg total) by mouth at bedtime., Disp: 30 tablet, Rfl: 11 .  glucose blood (ONETOUCH VERIO) test strip, Use as directed to check blood sugars 2 times per day dx: e11.65, Disp: 100 each, Rfl: 10 .  metFORMIN (GLUCOPHAGE) 1000 MG tablet, TAKE ONE TABLET BY MOUTH TWICE DAILY WITH MORNING AND EVENING MEALS., Disp: 180 tablet, Rfl: 0 .  pravastatin (PRAVACHOL) 40 MG tablet, Take 40 mg by mouth at bedtime., Disp: , Rfl:  .  Semaglutide,0.25 or 0.5MG/DOS, (OZEMPIC, 0.25 OR 0.5 MG/DOSE,) 2 MG/1.5ML SOPN, Inject 0.5 mg into the skin once a week., Disp: 4 pen, Rfl: 1 .  telmisartan (MICARDIS) 20 MG tablet, TAKE ONE TABLET BY MOUTH DAILY, Disp: 90 tablet, Rfl: 0 .  TRESIBA FLEXTOUCH 200 UNIT/ML SOPN, INJECT 51 UNITS UNDER SKIN AT BEDTIME (INCREASE AS DIRECTED) NOT TO EXCEED 100 UNITS (Patient taking differently: 44 Units. ), Disp: 9 pen, Rfl: 4   Allergies  Allergen Reactions  . Lisinopril Cough  . Hydrochlorothiazide Other (See Comments)    pancreatitis     No LMP recorded. Patient has had a hysterectomy.. . Negative for: breast discharge, breast lump(s), breast pain and breast self exam. Associated symptoms  include abnormal vaginal bleeding. Pertinent negatives include abnormal bleeding (hematology), anxiety, decreased libido, depression, difficulty falling sleep, dyspareunia, history of infertility, nocturia, sexual dysfunction, sleep disturbances, urinary incontinence, urinary urgency, vaginal discharge and vaginal itching. Diet regular.The patient states her exercise level is  minimal.  . The patient's tobacco use is:  Social History   Tobacco Use  Smoking Status Former Smoker  . Packs/day: 0.50  . Years: 25.00  . Pack years: 12.50  . Last attempt to quit: 04/29/2004  . Years since quitting: 14.4  Smokeless Tobacco Never Used   . She has been exposed to passive smoke. The patient's alcohol use is:  Social History   Substance and Sexual Activity  Alcohol Use No  . Frequency: Never    Review of Systems  Constitutional: Negative.   HENT: Negative.   Eyes: Negative for blurred vision.  Respiratory: Negative.  Negative for shortness of breath.   Cardiovascular: Negative.  Negative for chest pain and palpitations.  Gastrointestinal: Negative.   Endocrine: Negative.   Genitourinary: Negative.   Musculoskeletal: Positive for back pain (has upr back pain on the right. denies fall/trauma. ) and neck pain (she c/o neck pain. unable to determine her triggers. denies UE weakness. sometimes has UE tingling/numbness. Unable to determine her triggers. ).  Skin: Negative.   Allergic/Immunologic: Negative.   Neurological: Negative.   Hematological: Negative.   Psychiatric/Behavioral: Negative.      Today's Vitals   10/16/18 0956  BP: 114/76  Pulse: 79  Temp: 97.7 F (36.5 C)  TempSrc: Oral  Weight: 172 lb 3.2 oz (78.1 kg)  Height: 4' 11.5" (1.511 m)   Body mass index is 34.2 kg/m.   Objective:  Physical Exam Vitals signs and nursing note reviewed.  Constitutional:      Appearance: Normal appearance. She is obese.  HENT:     Head: Normocephalic and atraumatic.     Right Ear: Tympanic membrane, ear canal and external ear normal.     Left Ear: Tympanic membrane, ear canal and external ear normal.     Nose: Nose normal.     Mouth/Throat:     Mouth: Mucous membranes are moist.     Pharynx: Oropharynx is clear.  Neck:     Musculoskeletal: Neck supple. Muscular tenderness present.     Comments: There is pain with rotation to the right.  Cardiovascular:     Rate and Rhythm: Normal rate and regular rhythm.     Pulses: Normal pulses.          Dorsalis pedis pulses are 2+ on the right side and 2+ on the left side.     Heart sounds: Normal heart sounds.  Pulmonary:     Effort: Pulmonary effort is normal.      Breath sounds: Normal breath sounds.  Chest:     Breasts:        Right: No swelling, bleeding, inverted nipple, mass or nipple discharge.        Left: Normal. No swelling, bleeding, inverted nipple, mass or nipple discharge.  Abdominal:     General: Abdomen is flat. Bowel sounds are normal.     Palpations: Abdomen is soft.  Genitourinary:    Comments: deferred Musculoskeletal: Normal range of motion.  Feet:     Right foot:     Protective Sensation: 5 sites tested. 5 sites sensed.     Left foot:     Protective Sensation: 5 sites tested. 5 sites sensed.  Skin:    General: Skin  is warm.     Capillary Refill: Capillary refill takes less than 2 seconds.  Neurological:     General: No focal deficit present.     Mental Status: She is alert and oriented to person, place, and time.  Psychiatric:        Mood and Affect: Mood normal.         Assessment And Plan:     1. Routine general medical examination at health care facility  A full exam was performed. Importance of monthly self breast exams was discussed with the patient.  PATIENT HAS BEEN ADVISED TO GET 30-45 MINUTES REGULAR EXERCISE NO LESS THAN FOUR TO FIVE DAYS PER WEEK - BOTH WEIGHTBEARING EXERCISES AND AEROBIC ARE RECOMMENDED.  SHE IS ADVISED TO FOLLOW A HEALTHY DIET WITH AT LEAST SIX FRUITS/VEGGIES PER DAY, DECREASE INTAKE OF RED MEAT, AND TO INCREASE FISH INTAKE TO TWO DAYS PER WEEK.  MEATS/FISH SHOULD NOT BE FRIED, BAKED OR BROILED IS PREFERABLE.  I SUGGEST WEARING SPF 50 SUNSCREEN ON EXPOSED PARTS AND ESPECIALLY WHEN IN THE DIRECT SUNLIGHT FOR AN EXTENDED PERIOD OF TIME.  PLEASE AVOID FAST FOOD RESTAURANTS AND INCREASE YOUR WATER INTAKE.  - CMP14+EGFR - CBC - Lipid panel - Hemoglobin A1c  2. Uncontrolled type 2 diabetes mellitus with hyperglycemia (HCC)  Diabetic foot exam was performed.  I DISCUSSED WITH THE PATIENT AT LENGTH REGARDING THE GOALS OF GLYCEMIC CONTROL AND POSSIBLE LONG-TERM COMPLICATIONS.  I  ALSO STRESSED  THE IMPORTANCE OF COMPLIANCE WITH HOME GLUCOSE MONITORING, DIETARY RESTRICTIONS INCLUDING AVOIDANCE OF SUGARY DRINKS/PROCESSED FOODS,  ALONG WITH REGULAR EXERCISE.  I  ALSO STRESSED THE IMPORTANCE OF ANNUAL EYE EXAMS, SELF FOOT CARE AND COMPLIANCE WITH OFFICE VISITS.  - POCT Urinalysis Dipstick (81002) - POCT UA - Microalbumin  3. Essential hypertension  Well controlled. She will continue with current meds. She is encouraged to avoid adding salt to her foods.   - EKG 12-Lead  4. Pain of right scapula  There is point tenderness, suggestive of musculoskeletal strain. She will let me know if her sx persist.   5. Cervicalgia  I will refer her for cervical spine xray, multiple views.  Pt advised that she may need MRI in the future. She may benefit from PT w/ dry needling. I will make further recommendations once her labs are available for review.  - DG Neck Soft Tissue; Future        [name], MD

## 2018-10-16 NOTE — Patient Instructions (Signed)

## 2018-10-17 LAB — CMP14+EGFR
ALT: 17 IU/L (ref 0–32)
AST: 20 IU/L (ref 0–40)
Albumin/Globulin Ratio: 1.4 (ref 1.2–2.2)
Albumin: 4.2 g/dL (ref 3.8–4.9)
Alkaline Phosphatase: 98 IU/L (ref 39–117)
BUN/Creatinine Ratio: 13 (ref 9–23)
BUN: 13 mg/dL (ref 6–24)
Bilirubin Total: 0.2 mg/dL (ref 0.0–1.2)
CO2: 20 mmol/L (ref 20–29)
Calcium: 9.4 mg/dL (ref 8.7–10.2)
Chloride: 104 mmol/L (ref 96–106)
Creatinine, Ser: 0.97 mg/dL (ref 0.57–1.00)
GFR calc Af Amer: 75 mL/min/{1.73_m2} (ref >59)
GFR calc non Af Amer: 65 mL/min/{1.73_m2} (ref >59)
Globulin, Total: 2.9 g/dL (ref 1.5–4.5)
Glucose: 82 mg/dL (ref 65–99)
Potassium: 4.2 mmol/L (ref 3.5–5.2)
Sodium: 143 mmol/L (ref 134–144)
Total Protein: 7.1 g/dL (ref 6.0–8.5)

## 2018-10-17 LAB — CBC
Hematocrit: 35 % (ref 34.0–46.6)
Hemoglobin: 11.7 g/dL (ref 11.1–15.9)
MCH: 26.7 pg (ref 26.6–33.0)
MCHC: 33.4 g/dL (ref 31.5–35.7)
MCV: 80 fL (ref 79–97)
Platelets: 283 10*3/uL (ref 150–450)
RBC: 4.39 x10E6/uL (ref 3.77–5.28)
RDW: 14.1 % (ref 11.7–15.4)
WBC: 6.2 10*3/uL (ref 3.4–10.8)

## 2018-10-17 LAB — HEMOGLOBIN A1C
Est. average glucose Bld gHb Est-mCnc: 148 mg/dL
Hgb A1c MFr Bld: 6.8 % — ABNORMAL HIGH (ref 4.8–5.6)

## 2018-10-17 LAB — LIPID PANEL
Chol/HDL Ratio: 4 ratio (ref 0.0–4.4)
Cholesterol, Total: 153 mg/dL (ref 100–199)
HDL: 38 mg/dL — ABNORMAL LOW (ref >39)
LDL Calculated: 99 mg/dL (ref 0–99)
Triglycerides: 82 mg/dL (ref 0–149)
VLDL Cholesterol Cal: 16 mg/dL (ref 5–40)

## 2018-10-22 ENCOUNTER — Other Ambulatory Visit: Payer: Self-pay | Admitting: Internal Medicine

## 2018-10-23 ENCOUNTER — Ambulatory Visit
Admission: RE | Admit: 2018-10-23 | Discharge: 2018-10-23 | Disposition: A | Discharge status: Home / Self Care | Payer: BC Managed Care – PPO | Source: Ambulatory Visit | Attending: Internal Medicine | Admitting: Internal Medicine

## 2018-10-23 ENCOUNTER — Other Ambulatory Visit: Payer: Self-pay | Admitting: Internal Medicine

## 2018-10-23 DIAGNOSIS — M542 Cervicalgia: Secondary | ICD-10-CM

## 2018-11-11 ENCOUNTER — Telehealth: Payer: Self-pay

## 2018-11-11 NOTE — Telephone Encounter (Signed)
Left the pt a message that the office was returning her call for an appointment.

## 2018-11-13 ENCOUNTER — Encounter: Payer: Self-pay | Admitting: Internal Medicine

## 2018-11-13 ENCOUNTER — Ambulatory Visit: Payer: BC Managed Care – PPO | Admitting: Internal Medicine

## 2018-11-13 VITALS — BP 134/82 | HR 82 | Temp 97.9°F | Ht 61.2 in | Wt 173.4 lb

## 2018-11-13 DIAGNOSIS — E119 Type 2 diabetes mellitus without complications: Secondary | ICD-10-CM | POA: Diagnosis not present

## 2018-11-13 DIAGNOSIS — G44209 Tension-type headache, unspecified, not intractable: Secondary | ICD-10-CM | POA: Diagnosis not present

## 2018-11-13 DIAGNOSIS — M5412 Radiculopathy, cervical region: Secondary | ICD-10-CM | POA: Insufficient documentation

## 2018-11-13 MED ORDER — CYCLOBENZAPRINE HCL 10 MG PO TABS: 10.0000 mg | ORAL_TABLET | Freq: Every day | ORAL | 0 refills | Status: DC

## 2018-11-17 NOTE — Progress Notes (Signed)
Subjective:     Patient ID: Teresa Grant , female    DOB: November 14, 1960 , 58 y.o.   MRN: [id number]   Chief Complaint  Patient presents with  . Headache  . neck pain    HPI  Headache   This is a recurrent problem. The current episode started in the past 7 days. The problem occurs intermittently. The problem has been gradually worsening. The pain is located in the occipital region. The pain radiates to the upper back, right neck and left neck. The quality of the pain is described as aching and dull. The pain is at a severity of 6/10. Associated symptoms include neck pain.  Neck Pain   This is a recurrent problem. The current episode started more than 1 month ago. The problem occurs intermittently. The problem has been gradually worsening. The pain is present in the left side and right side. The quality of the pain is described as aching. Associated symptoms include headaches.     Past Medical History:  Diagnosis Date  . Diabetes mellitus   . Dysrhythmia    1985  . Heart murmur   . HLD (hyperlipidemia)   . Hypertension   . Kidney stone on right side 03/2013  . Pneumonia    7 yrs ago     Family History  Problem Relation Age of Onset  . Heart disease Mother   . Kidney disease Father   . Esophageal cancer Maternal Uncle   . Colon cancer Cousin   . Stroke Neg Hx   . Cancer Neg Hx   . Colon polyps Neg Hx   . Rectal cancer Neg Hx   . Stomach cancer Neg Hx      Current Outpatient Medications:  .  amLODipine (NORVASC) 10 MG tablet, Take 1 tablet (10 mg total) by mouth at bedtime., Disp: 30 tablet, Rfl: 11 .  glucose blood (ONETOUCH VERIO) test strip, Use as directed to check blood sugars 2 times per day dx: e11.65, Disp: 100 each, Rfl: 10 .  metFORMIN (GLUCOPHAGE) 1000 MG tablet, TAKE ONE TABLET BY MOUTH TWICE A DAY WITH MORNING AND EVENING MEALS, Disp: 180 tablet, Rfl: 0 .  pravastatin (PRAVACHOL) 40 MG tablet, Take 40 mg by mouth at bedtime., Disp: , Rfl:  .   Semaglutide,0.25 or 0.5MG /DOS, (OZEMPIC, 0.25 OR 0.5 MG/DOSE,) 2 MG/1.5ML SOPN, Inject 0.5 mg into the skin once a week., Disp: 4 pen, Rfl: 1 .  telmisartan (MICARDIS) 20 MG tablet, TAKE ONE TABLET BY MOUTH DAILY, Disp: 90 tablet, Rfl: 0 .  TRESIBA FLEXTOUCH 200 UNIT/ML SOPN, INJECT 51 UNITS UNDER SKIN AT BEDTIME (INCREASE AS DIRECTED) NOT TO EXCEED 100 UNITS (Patient taking differently: 44 Units. ), Disp: 9 pen, Rfl: 4 .  cyclobenzaprine (FLEXERIL) 10 MG tablet, Take 1 tablet (10 mg total) by mouth at bedtime. Please add prn, Disp: 30 tablet, Rfl: 0   Allergies  Allergen Reactions  . Lisinopril Cough  . Hydrochlorothiazide Other (See Comments)    pancreatitis     Review of Systems  Constitutional: Negative.   Respiratory: Negative.   Cardiovascular: Negative.   Gastrointestinal: Negative.   Musculoskeletal: Positive for neck pain.  Neurological: Positive for headaches.  Psychiatric/Behavioral: Negative.      Today's Vitals   11/13/18 1018  BP: 134/82  Pulse: 82  Temp: 97.9 F (36.6 C)  TempSrc: Oral  SpO2: 98%  Weight: 173 lb 6.4 oz (78.7 kg)  Height: 5' 1.2" (1.554 m)  PainSc: 8   PainLoc:  Head   Body mass index is 32.55 kg/m.   Objective:  Physical Exam Vitals signs and nursing note reviewed.  Constitutional:      Appearance: She is well-developed.  HENT:     Head: Normocephalic and atraumatic.  Cardiovascular:     Rate and Rhythm: Normal rate and regular rhythm.     Heart sounds: Normal heart sounds.  Pulmonary:     Effort: Pulmonary effort is normal.     Breath sounds: Normal breath sounds.  Neurological:     Mental Status: She is alert.  Psychiatric:        Mood and Affect: Mood normal.         Assessment And Plan:     1. Tension headache  She is encouraged to apply topical pain cream to base of neck and shoulders. She is also encouraged to start magnesium 250mg  - 400mg  nightly.   2. Cervical radiculopathy  She was given rx flexeril to use  nightly as needed. I hesitate to use prednisone due to recent h/o uncontrolled dm.   - MR Cervical Spine Wo Contrast; Future        [name], MD

## 2018-11-20 ENCOUNTER — Ambulatory Visit
Admission: RE | Admit: 2018-11-20 | Discharge: 2018-11-20 | Disposition: A | Discharge status: Home / Self Care | Payer: BC Managed Care – PPO | Source: Ambulatory Visit | Attending: Internal Medicine | Admitting: Internal Medicine

## 2018-11-20 ENCOUNTER — Other Ambulatory Visit: Payer: Self-pay | Admitting: Internal Medicine

## 2018-11-20 DIAGNOSIS — M5412 Radiculopathy, cervical region: Secondary | ICD-10-CM

## 2018-11-23 ENCOUNTER — Other Ambulatory Visit: Payer: Self-pay | Admitting: Internal Medicine

## 2018-11-23 MED ORDER — GABAPENTIN 100 MG PO CAPS: ORAL_CAPSULE | ORAL | 0 refills | Status: DC

## 2018-11-23 MED ORDER — GABAPENTIN 100 MG PO CAPS
100.0000 mg | ORAL_CAPSULE | Freq: Three times a day (TID) | ORAL | 2 refills | Status: DC
Start: 2018-11-23 — End: 2018-11-23

## 2018-11-24 ENCOUNTER — Other Ambulatory Visit: Payer: Self-pay | Admitting: Internal Medicine

## 2018-11-25 ENCOUNTER — Encounter: Payer: Self-pay | Admitting: Internal Medicine

## 2018-11-26 ENCOUNTER — Other Ambulatory Visit: Payer: Self-pay | Admitting: Internal Medicine

## 2018-12-05 ENCOUNTER — Other Ambulatory Visit: Payer: Self-pay | Admitting: Internal Medicine

## 2018-12-05 DIAGNOSIS — M5412 Radiculopathy, cervical region: Secondary | ICD-10-CM

## 2018-12-05 NOTE — Progress Notes (Signed)
rf

## 2018-12-09 ENCOUNTER — Encounter: Payer: Self-pay | Admitting: Internal Medicine

## 2018-12-11 ENCOUNTER — Encounter: Payer: Self-pay | Admitting: Internal Medicine

## 2018-12-19 ENCOUNTER — Other Ambulatory Visit: Payer: Self-pay | Admitting: Internal Medicine

## 2018-12-23 ENCOUNTER — Other Ambulatory Visit: Payer: Self-pay | Admitting: Internal Medicine

## 2018-12-24 ENCOUNTER — Other Ambulatory Visit: Payer: Self-pay | Admitting: Internal Medicine

## 2019-01-09 ENCOUNTER — Telehealth: Payer: Self-pay | Admitting: Internal Medicine

## 2019-01-09 NOTE — Telephone Encounter (Signed)
Spk with patient and she has agreed to have a virtual visit with Dr. Baird Cancer on 01/15/19

## 2019-01-15 ENCOUNTER — Other Ambulatory Visit: Payer: Self-pay

## 2019-01-15 ENCOUNTER — Ambulatory Visit: Payer: BC Managed Care – PPO | Admitting: Internal Medicine

## 2019-01-15 ENCOUNTER — Encounter: Payer: Self-pay | Admitting: Internal Medicine

## 2019-01-15 VITALS — Temp 97.4°F | Ht 61.2 in | Wt 165.0 lb

## 2019-01-15 DIAGNOSIS — E1165 Type 2 diabetes mellitus with hyperglycemia: Secondary | ICD-10-CM

## 2019-01-15 DIAGNOSIS — M4802 Spinal stenosis, cervical region: Secondary | ICD-10-CM | POA: Diagnosis not present

## 2019-01-15 DIAGNOSIS — I10 Essential (primary) hypertension: Secondary | ICD-10-CM

## 2019-01-15 DIAGNOSIS — Z6830 Body mass index (BMI) 30.0-30.9, adult: Secondary | ICD-10-CM

## 2019-01-15 DIAGNOSIS — E6609 Other obesity due to excess calories: Secondary | ICD-10-CM

## 2019-01-15 NOTE — Patient Instructions (Signed)
Diabetes Mellitus and Nutrition, Adult  When you have diabetes (diabetes mellitus), it is very important to have healthy eating habits because your blood sugar (glucose) levels are greatly affected by what you eat and drink. Eating healthy foods in the appropriate amounts, at about the same times every day, can help you:  · Control your blood glucose.  · Lower your risk of heart disease.  · Improve your blood pressure.  · Reach or maintain a healthy weight.  Every person with diabetes is different, and each person has different needs for a meal plan. Your health care provider may recommend that you work with a diet and nutrition specialist (dietitian) to make a meal plan that is best for you. Your meal plan may vary depending on factors such as:  · The calories you need.  · The medicines you take.  · Your weight.  · Your blood glucose, blood pressure, and cholesterol levels.  · Your activity level.  · Other health conditions you have, such as heart or kidney disease.  How do carbohydrates affect me?  Carbohydrates, also called carbs, affect your blood glucose level more than any other type of food. Eating carbs naturally raises the amount of glucose in your blood. Carb counting is a method for keeping track of how many carbs you eat. Counting carbs is important to keep your blood glucose at a healthy level, especially if you use insulin or take certain oral diabetes medicines.  It is important to know how many carbs you can safely have in each meal. This is different for every person. Your dietitian can help you calculate how many carbs you should have at each meal and for each snack.  Foods that contain carbs include:  · Bread, cereal, rice, pasta, and crackers.  · Potatoes and corn.  · Peas, beans, and lentils.  · Milk and yogurt.  · Fruit and juice.  · Desserts, such as cakes, cookies, ice cream, and candy.  How does alcohol affect me?  Alcohol can cause a sudden decrease in blood glucose (hypoglycemia),  especially if you use insulin or take certain oral diabetes medicines. Hypoglycemia can be a life-threatening condition. Symptoms of hypoglycemia (sleepiness, dizziness, and confusion) are similar to symptoms of having too much alcohol.  If your health care provider says that alcohol is safe for you, follow these guidelines:  · Limit alcohol intake to no more than 1 drink per day for nonpregnant women and 2 drinks per day for men. One drink equals 12 oz of beer, 5 oz of wine, or 1½ oz of hard liquor.  · Do not drink on an empty stomach.  · Keep yourself hydrated with water, diet soda, or unsweetened iced tea.  · Keep in mind that regular soda, juice, and other mixers may contain a lot of sugar and must be counted as carbs.  What are tips for following this plan?    Reading food labels  · Start by checking the serving size on the "Nutrition Facts" label of packaged foods and drinks. The amount of calories, carbs, fats, and other nutrients listed on the label is based on one serving of the item. Many items contain more than one serving per package.  · Check the total grams (g) of carbs in one serving. You can calculate the number of servings of carbs in one serving by dividing the total carbs by 15. For example, if a food has 30 g of total carbs, it would be equal to 2   servings of carbs.  · Check the number of grams (g) of saturated and trans fats in one serving. Choose foods that have low or no amount of these fats.  · Check the number of milligrams (mg) of salt (sodium) in one serving. Most people should limit total sodium intake to less than 2,300 mg per day.  · Always check the nutrition information of foods labeled as "low-fat" or "nonfat". These foods may be higher in added sugar or refined carbs and should be avoided.  · Talk to your dietitian to identify your daily goals for nutrients listed on the label.  Shopping  · Avoid buying canned, premade, or processed foods. These foods tend to be high in fat, sodium,  and added sugar.  · Shop around the outside edge of the grocery store. This includes fresh fruits and vegetables, bulk grains, fresh meats, and fresh dairy.  Cooking  · Use low-heat cooking methods, such as baking, instead of high-heat cooking methods like deep frying.  · Cook using healthy oils, such as olive, canola, or sunflower oil.  · Avoid cooking with butter, cream, or high-fat meats.  Meal planning  · Eat meals and snacks regularly, preferably at the same times every day. Avoid going long periods of time without eating.  · Eat foods high in fiber, such as fresh fruits, vegetables, beans, and whole grains. Talk to your dietitian about how many servings of carbs you can eat at each meal.  · Eat 4-6 ounces (oz) of lean protein each day, such as lean meat, chicken, fish, eggs, or tofu. One oz of lean protein is equal to:  ? 1 oz of meat, chicken, or fish.  ? 1 egg.  ? ¼ cup of tofu.  · Eat some foods each day that contain healthy fats, such as avocado, nuts, seeds, and fish.  Lifestyle  · Check your blood glucose regularly.  · Exercise regularly as told by your health care provider. This may include:  ? 150 minutes of moderate-intensity or vigorous-intensity exercise each week. This could be brisk walking, biking, or water aerobics.  ? Stretching and doing strength exercises, such as yoga or weightlifting, at least 2 times a week.  · Take medicines as told by your health care provider.  · Do not use any products that contain nicotine or tobacco, such as cigarettes and e-cigarettes. If you need help quitting, ask your health care provider.  · Work with a counselor or diabetes educator to identify strategies to manage stress and any emotional and social challenges.  Questions to ask a health care provider  · Do I need to meet with a diabetes educator?  · Do I need to meet with a dietitian?  · What number can I call if I have questions?  · When are the best times to check my blood glucose?  Where to find more  information:  · American Diabetes Association: diabetes.org  · Academy of Nutrition and Dietetics: www.eatright.org  · National Institute of Diabetes and Digestive and Kidney Diseases (NIH): www.niddk.nih.gov  Summary  · A healthy meal plan will help you control your blood glucose and maintain a healthy lifestyle.  · Working with a diet and nutrition specialist (dietitian) can help you make a meal plan that is best for you.  · Keep in mind that carbohydrates (carbs) and alcohol have immediate effects on your blood glucose levels. It is important to count carbs and to use alcohol carefully.  This information is not intended to   replace advice given to you by your health care provider. Make sure you discuss any questions you have with your health care provider.  Document Released: 06/08/2005 Document Revised: 04/11/2017 Document Reviewed: 10/16/2016  Elsevier Interactive Patient Education © 2019 [employer]

## 2019-01-16 ENCOUNTER — Other Ambulatory Visit: Payer: BC Managed Care – PPO

## 2019-01-16 DIAGNOSIS — E1165 Type 2 diabetes mellitus with hyperglycemia: Secondary | ICD-10-CM

## 2019-01-16 NOTE — Progress Notes (Signed)
Virtual Visit via Video   This visit type was conducted due to national recommendations for restrictions regarding the COVID-19 Pandemic (e.g. social distancing) in an effort to limit this patient's exposure and mitigate transmission in our community.  Due to her co-morbid illnesses, this patient is at least at moderate risk for complications without adequate follow up.  This format is felt to be most appropriate for this patient at this time.  All issues noted in this document were discussed and addressed.  A limited physical exam was performed with this format.    This visit type was conducted due to national recommendations for restrictions regarding the COVID-19 Pandemic (e.g. social distancing) in an effort to limit this patient's exposure and mitigate transmission in our community.  Patients identity confirmed using two different identifiers.  This format is felt to be most appropriate for this patient at this time.  All issues noted in this document were discussed and addressed.  No physical exam was performed (except for noted visual exam findings with Video Visits).    Date:  01/16/2019   ID:  Teresa Grant, DOB 01-18-1961, MRN [id number]  Patient Location:  Home  Provider location:   Office    Chief Complaint:  DM check  History of Present Illness:    Teresa Grant is a 58 y.o. female who presents via video conferencing for a telehealth visit today.    The patient does not have symptoms concerning for COVID-19 infection (fever, chills, cough, or new shortness of breath).   Diabetes  She presents for her follow-up diabetic visit. She has type 2 diabetes mellitus. Her disease course has been stable. There are no hypoglycemic associated symptoms. Pertinent negatives for diabetes include no blurred vision and no chest pain. There are no hypoglycemic complications. Risk factors for coronary artery disease include diabetes mellitus, dyslipidemia, hypertension, obesity,  post-menopausal, sedentary lifestyle and stress. Her breakfast blood glucose is taken between 8-9 am. Her breakfast blood glucose range is generally 130-140 mg/dl. An ACE inhibitor/angiotensin II receptor blocker is being taken.  Hypertension  This is a chronic problem. The current episode started more than 1 year ago. The problem has been gradually improving since onset. The problem is controlled. Associated symptoms include neck pain. Pertinent negatives include no blurred vision or chest pain.   She reports compliance with meds.   Past Medical History:  Diagnosis Date  . Diabetes mellitus   . Dysrhythmia    1985  . Heart murmur   . HLD (hyperlipidemia)   . Hypertension   . Kidney stone on right side 03/2013  . Pneumonia    7 yrs ago   Past Surgical History:  Procedure Laterality Date  . ABDOMINAL HYSTERECTOMY  2001  . BACK SURGERY    . CESAREAN SECTION  1990  . COLONOSCOPY     Dr. Deatra Ina normal  . LUMBAR LAMINECTOMY/DECOMPRESSION MICRODISCECTOMY  03/20/2012   Procedure: LUMBAR LAMINECTOMY/DECOMPRESSION MICRODISCECTOMY;  Surgeon: Sinclair Ship, MD;  Location: [facility];  Service: Orthopedics;  Laterality: Left;  Left sided lumbar 4-5 microdisectomy  . SPINE SURGERY  2013     Current Meds  Medication Sig  . amLODipine (NORVASC) 10 MG tablet Take 1 tablet (10 mg total) by mouth at bedtime.  . gabapentin (NEURONTIN) 100 MG capsule One capsule po qhs nightly x 7 days, then 2 capsules nightly.   Please disregard tid dosing = this was sent in error  . glucose blood (ONETOUCH VERIO) test strip Use as directed  to check blood sugars 2 times per day dx: e11.65  . ibuprofen (ADVIL) 400 MG tablet   . JANUVIA 100 MG tablet TAKE ONE TABLET BY MOUTH DAILY  . metFORMIN (GLUCOPHAGE) 1000 MG tablet TAKE ONE TABLET BY MOUTH TWICE A DAY WITH MORNING AND EVENING MEALS  . OZEMPIC, 0.25 OR 0.5 MG/DOSE, 2 MG/1.5ML SOPN DIAL AND INJECT SUBCUTANEOUSLY 0.5 MG WEEKLY  . pravastatin (PRAVACHOL) 40 MG  tablet Take 40 mg by mouth at bedtime.  Marland Kitchen telmisartan (MICARDIS) 20 MG tablet TAKE 1 TABLET BY MOUTH DAILY  . TRESIBA FLEXTOUCH 200 UNIT/ML SOPN INJECT 51 UNITS UNDER SKIN AT BEDTIME (INCREASE AS DIRECTED) NOT TO EXCEED 100 UNITS (Patient taking differently: 44 Units. )     Allergies:   Lisinopril and Hydrochlorothiazide   Social History   Tobacco Use  . Smoking status: Former Smoker    Packs/day: 0.50    Years: 25.00    Pack years: 12.50    Last attempt to quit: 04/29/2004    Years since quitting: 14.7  . Smokeless tobacco: Never Used  Substance Use Topics  . Alcohol use: No    Frequency: Never  . Drug use: No     Family Hx: The patient's family history includes Colon cancer in her cousin; Esophageal cancer in her maternal uncle; Heart disease in her mother; Kidney disease in her father. There is no history of Stroke, Cancer, Colon polyps, Rectal cancer, or Stomach cancer.  ROS:   Please see the history of present illness.    Review of Systems  Constitutional: Negative.   Eyes: Negative for blurred vision.  Respiratory: Negative.   Cardiovascular: Negative.  Negative for chest pain.  Gastrointestinal: Negative.   Musculoskeletal: Positive for neck pain.  Neurological: Negative.   Psychiatric/Behavioral: Negative.     All other systems reviewed and are negative.   Labs/Other Tests and Data Reviewed:    Recent Labs: 10/16/2018: ALT 17; BUN 13; Creatinine, Ser 0.97; Hemoglobin 11.7; Platelets 283; Potassium 4.2; Sodium 143   Recent Lipid Panel Lab Results  Component Value Date/Time   CHOL 153 10/16/2018 11:13 AM   TRIG 82 10/16/2018 11:13 AM   HDL 38 (L) 10/16/2018 11:13 AM   CHOLHDL 4.0 10/16/2018 11:13 AM   CHOLHDL 2.8 09/13/2012 11:56 AM   LDLCALC 99 10/16/2018 11:13 AM    Wt Readings from Last 3 Encounters:  01/15/19 165 lb (74.8 kg)  11/13/18 173 lb 6.4 oz (78.7 kg)  10/16/18 172 lb 3.2 oz (78.1 kg)     Exam:    Vital Signs:  Temp (!) 97.4 F (36.3 C)  (Oral)   Ht 5' 1.2" (1.554 m)   Wt 165 lb (74.8 kg) Comment: pt provided  BMI 30.97 kg/m     Physical Exam  Constitutional: She is oriented to person, place, and time and well-developed, well-nourished, and in no distress.  HENT:  Head: Normocephalic and atraumatic.  Neck: Normal range of motion.  Pulmonary/Chest: Effort normal.  Neurological: She is alert and oriented to person, place, and time.  Psychiatric: Affect normal.  Nursing note and vitals reviewed.   ASSESSMENT & PLAN:     1. Uncontrolled type 2 diabetes mellitus with hyperglycemia ([place])  She agrees to come in later this week for bloodwork. Importance of dietary and medication compliance was discussed with the patient. She is encouraged to walk 30 minutes daily.   - BMP8+EGFR; Future - Hemoglobin A1c; Future  2. Essential hypertension  Chronic. She will continue with current meds.  She is encouraged to restrict her salt intake.   3. Cervical spinal stenosis  She has been evaluated by neurosurgery. She was advised to start NSAIDS prn. She has f/u in several weeks. Unfortunately,she is still having some discomfort. She takes NSAIDS as needed. Risks associated with long-term use was discussed with the patient.   4. Class 1 obesity due to excess calories with serious comorbidity and body mass index (BMI) of 30.0 to 30.9 in adult  She is encouraged to lose 7-10 pounds to decrease cardiac risk. She is also encouraged to exercise 30 minutes five days weekly and avoid processed foods. I will reassess at her next visit in 3 months.     COVID-19 Education: The signs and symptoms of COVID-19 were discussed with the patient and how to seek care for testing (follow up with PCP or arrange E-visit).  The importance of social distancing was discussed today.  Patient Risk:   After full review of this patients clinical status, I feel that they are at least moderate risk at this time.  Time:   Today, I have spent 12 minutes/ 36  seconds with the patient with telehealth technology discussing above diagnoses.     Medication Adjustments/Labs and Tests Ordered: Current medicines are reviewed at length with the patient today.  Concerns regarding medicines are outlined above.   Tests Ordered: Orders Placed This Encounter  Procedures  . BMP8+EGFR  . Hemoglobin A1c    Medication Changes: No orders of the defined types were placed in this encounter.    Disposition:  Follow up in 3 month(s)  Signed, [name], MD

## 2019-01-17 ENCOUNTER — Other Ambulatory Visit: Payer: Self-pay | Admitting: Internal Medicine

## 2019-01-17 LAB — BMP8+EGFR
BUN/Creatinine Ratio: 13 (ref 9–23)
BUN: 12 mg/dL (ref 6–24)
CO2: 22 mmol/L (ref 20–29)
Calcium: 9.7 mg/dL (ref 8.7–10.2)
Chloride: 102 mmol/L (ref 96–106)
Creatinine, Ser: 0.9 mg/dL (ref 0.57–1.00)
GFR calc Af Amer: 82 mL/min/{1.73_m2} (ref >59)
GFR calc non Af Amer: 71 mL/min/{1.73_m2} (ref >59)
Glucose: 161 mg/dL — ABNORMAL HIGH (ref 65–99)
Potassium: 4.2 mmol/L (ref 3.5–5.2)
Sodium: 139 mmol/L (ref 134–144)

## 2019-01-17 LAB — HEMOGLOBIN A1C
Est. average glucose Bld gHb Est-mCnc: 166 mg/dL
Hgb A1c MFr Bld: 7.4 % — ABNORMAL HIGH (ref 4.8–5.6)

## 2019-02-04 ENCOUNTER — Encounter: Payer: Self-pay | Admitting: Internal Medicine

## 2019-02-19 ENCOUNTER — Other Ambulatory Visit: Payer: Self-pay | Admitting: Internal Medicine

## 2019-02-19 NOTE — Telephone Encounter (Signed)
Gabapentin refill

## 2019-02-20 ENCOUNTER — Other Ambulatory Visit: Payer: Self-pay | Admitting: Internal Medicine

## 2019-02-20 ENCOUNTER — Other Ambulatory Visit: Payer: Self-pay | Admitting: Neurosurgery

## 2019-02-20 DIAGNOSIS — M4802 Spinal stenosis, cervical region: Secondary | ICD-10-CM

## 2019-02-28 ENCOUNTER — Ambulatory Visit
Admission: RE | Admit: 2019-02-28 | Discharge: 2019-02-28 | Disposition: A | Discharge status: Home / Self Care | Payer: BC Managed Care – PPO | Source: Ambulatory Visit | Attending: Neurosurgery | Admitting: Neurosurgery

## 2019-02-28 ENCOUNTER — Other Ambulatory Visit: Payer: Self-pay

## 2019-02-28 ENCOUNTER — Encounter: Payer: Self-pay | Admitting: Internal Medicine

## 2019-02-28 DIAGNOSIS — M4802 Spinal stenosis, cervical region: Secondary | ICD-10-CM

## 2019-03-02 ENCOUNTER — Emergency Department (HOSPITAL_COMMUNITY): Payer: BC Managed Care – PPO

## 2019-03-02 ENCOUNTER — Encounter (HOSPITAL_COMMUNITY): Payer: Self-pay | Admitting: Emergency Medicine

## 2019-03-02 ENCOUNTER — Inpatient Hospital Stay (HOSPITAL_COMMUNITY)
Admission: EM | Admit: 2019-03-02 | Discharge: 2019-03-05 | DRG: 177 | Disposition: A | Discharge status: Home / Self Care | Payer: BC Managed Care – PPO | Attending: Internal Medicine | Admitting: Internal Medicine

## 2019-03-02 ENCOUNTER — Other Ambulatory Visit: Payer: Self-pay

## 2019-03-02 DIAGNOSIS — I10 Essential (primary) hypertension: Secondary | ICD-10-CM | POA: Diagnosis present

## 2019-03-02 DIAGNOSIS — E1169 Type 2 diabetes mellitus with other specified complication: Secondary | ICD-10-CM | POA: Diagnosis not present

## 2019-03-02 DIAGNOSIS — E119 Type 2 diabetes mellitus without complications: Secondary | ICD-10-CM | POA: Diagnosis present

## 2019-03-02 DIAGNOSIS — Z87891 Personal history of nicotine dependence: Secondary | ICD-10-CM

## 2019-03-02 DIAGNOSIS — Z841 Family history of disorders of kidney and ureter: Secondary | ICD-10-CM

## 2019-03-02 DIAGNOSIS — Z79899 Other long term (current) drug therapy: Secondary | ICD-10-CM

## 2019-03-02 DIAGNOSIS — Z800 Family history of malignant neoplasm of digestive organs: Secondary | ICD-10-CM | POA: Diagnosis not present

## 2019-03-02 DIAGNOSIS — U071 COVID-19: Secondary | ICD-10-CM | POA: Diagnosis not present

## 2019-03-02 DIAGNOSIS — Z79891 Long term (current) use of opiate analgesic: Secondary | ICD-10-CM

## 2019-03-02 DIAGNOSIS — N179 Acute kidney failure, unspecified: Secondary | ICD-10-CM | POA: Diagnosis present

## 2019-03-02 DIAGNOSIS — E669 Obesity, unspecified: Secondary | ICD-10-CM | POA: Diagnosis present

## 2019-03-02 DIAGNOSIS — Z7984 Long term (current) use of oral hypoglycemic drugs: Secondary | ICD-10-CM | POA: Diagnosis not present

## 2019-03-02 DIAGNOSIS — R6883 Chills (without fever): Secondary | ICD-10-CM | POA: Diagnosis present

## 2019-03-02 DIAGNOSIS — J189 Pneumonia, unspecified organism: Secondary | ICD-10-CM

## 2019-03-02 DIAGNOSIS — Z8249 Family history of ischemic heart disease and other diseases of the circulatory system: Secondary | ICD-10-CM | POA: Diagnosis not present

## 2019-03-02 DIAGNOSIS — J1282 Pneumonia due to coronavirus disease 2019: Secondary | ICD-10-CM | POA: Diagnosis present

## 2019-03-02 DIAGNOSIS — D649 Anemia, unspecified: Secondary | ICD-10-CM | POA: Diagnosis present

## 2019-03-02 DIAGNOSIS — E785 Hyperlipidemia, unspecified: Secondary | ICD-10-CM | POA: Diagnosis present

## 2019-03-02 DIAGNOSIS — Z87442 Personal history of urinary calculi: Secondary | ICD-10-CM

## 2019-03-02 DIAGNOSIS — J1289 Other viral pneumonia: Secondary | ICD-10-CM | POA: Diagnosis present

## 2019-03-02 DIAGNOSIS — Z90710 Acquired absence of both cervix and uterus: Secondary | ICD-10-CM | POA: Diagnosis not present

## 2019-03-02 LAB — CBC WITH DIFFERENTIAL/PLATELET
Abs Immature Granulocytes: 0.04 10*3/uL (ref 0.00–0.07)
Basophils Absolute: 0 10*3/uL (ref 0.0–0.1)
Basophils Relative: 0 %
Eosinophils Absolute: 0 10*3/uL (ref 0.0–0.5)
Eosinophils Relative: 0 %
HCT: 36.5 % (ref 36.0–46.0)
Hemoglobin: 11.4 g/dL — ABNORMAL LOW (ref 12.0–15.0)
Immature Granulocytes: 1 %
Lymphocytes Relative: 27 %
Lymphs Abs: 1.7 10*3/uL (ref 0.7–4.0)
MCH: 26.3 pg (ref 26.0–34.0)
MCHC: 31.2 g/dL (ref 30.0–36.0)
MCV: 84.1 fL (ref 80.0–100.0)
Monocytes Absolute: 0.3 10*3/uL (ref 0.1–1.0)
Monocytes Relative: 5 %
Neutro Abs: 4.3 10*3/uL (ref 1.7–7.7)
Neutrophils Relative %: 67 %
Platelets: 227 10*3/uL (ref 150–400)
RBC: 4.34 MIL/uL (ref 3.87–5.11)
RDW: 13.7 % (ref 11.5–15.5)
WBC: 6.3 10*3/uL (ref 4.0–10.5)
nRBC: 0 % (ref 0.0–0.2)

## 2019-03-02 LAB — LACTIC ACID, PLASMA: Lactic Acid, Venous: 1.5 mmol/L (ref 0.5–1.9)

## 2019-03-02 LAB — COMPREHENSIVE METABOLIC PANEL
ALT: 30 U/L (ref 0–44)
AST: 32 U/L (ref 15–41)
Albumin: 3.1 g/dL — ABNORMAL LOW (ref 3.5–5.0)
Alkaline Phosphatase: 97 U/L (ref 38–126)
Anion gap: 10 (ref 5–15)
BUN: 9 mg/dL (ref 6–20)
CO2: 24 mmol/L (ref 22–32)
Calcium: 8.6 mg/dL — ABNORMAL LOW (ref 8.9–10.3)
Chloride: 106 mmol/L (ref 98–111)
Creatinine, Ser: 1.13 mg/dL — ABNORMAL HIGH (ref 0.44–1.00)
GFR calc Af Amer: >60 mL/min (ref >60)
GFR calc non Af Amer: 54 mL/min — ABNORMAL LOW (ref >60)
Glucose, Bld: 146 mg/dL — ABNORMAL HIGH (ref 70–99)
Potassium: 3.2 mmol/L — ABNORMAL LOW (ref 3.5–5.1)
Sodium: 140 mmol/L (ref 135–145)
Total Bilirubin: 0.6 mg/dL (ref 0.3–1.2)
Total Protein: 7.5 g/dL (ref 6.5–8.1)

## 2019-03-02 LAB — CBG MONITORING, ED: Glucose-Capillary: 132 mg/dL — ABNORMAL HIGH (ref 70–99)

## 2019-03-02 LAB — URINALYSIS, ROUTINE W REFLEX MICROSCOPIC
Bacteria, UA: NONE SEEN
Bilirubin Urine: NEGATIVE
Glucose, UA: NEGATIVE mg/dL
Hgb urine dipstick: NEGATIVE
Ketones, ur: NEGATIVE mg/dL
Leukocytes,Ua: NEGATIVE
Nitrite: NEGATIVE
Protein, ur: >=300 mg/dL — AB
Specific Gravity, Urine: 1.016 (ref 1.005–1.030)
pH: 5 (ref 5.0–8.0)

## 2019-03-02 LAB — SARS CORONAVIRUS 2 BY RT PCR (HOSPITAL ORDER, PERFORMED IN ~~LOC~~ HOSPITAL LAB): SARS Coronavirus 2: POSITIVE — AB

## 2019-03-02 MED ORDER — SODIUM CHLORIDE 0.9 % IV SOLN
1.0000 g | Freq: Once | INTRAVENOUS | Status: AC
  Administered 2019-03-02: 1 g via INTRAVENOUS
  Filled 2019-03-02: qty 10

## 2019-03-02 MED ORDER — ACETAMINOPHEN 325 MG PO TABS
650.0000 mg | ORAL_TABLET | Freq: Once | ORAL | Status: AC
  Administered 2019-03-02: 650 mg via ORAL
  Filled 2019-03-02: qty 2

## 2019-03-02 MED ORDER — INSULIN ASPART 100 UNIT/ML ~~LOC~~ SOLN
0.0000 [IU] | Freq: Three times a day (TID) | SUBCUTANEOUS | Status: DC
  Administered 2019-03-03: 2 [IU] via SUBCUTANEOUS
  Administered 2019-03-03: 5 [IU] via SUBCUTANEOUS
  Administered 2019-03-03: 18:00:00 3 [IU] via SUBCUTANEOUS
  Administered 2019-03-04: 12:00:00 7 [IU] via SUBCUTANEOUS
  Administered 2019-03-04: 9 [IU] via SUBCUTANEOUS

## 2019-03-02 MED ORDER — IRBESARTAN 75 MG PO TABS
75.0000 mg | ORAL_TABLET | Freq: Every day | ORAL | Status: DC
  Administered 2019-03-03 – 2019-03-05 (×3): 75 mg via ORAL
  Filled 2019-03-02 (×4): qty 1

## 2019-03-02 MED ORDER — AMLODIPINE BESYLATE 5 MG PO TABS
10.0000 mg | ORAL_TABLET | Freq: Every day | ORAL | Status: DC
  Administered 2019-03-03 – 2019-03-04 (×3): 10 mg via ORAL
  Filled 2019-03-02 (×3): qty 2

## 2019-03-02 MED ORDER — PRAVASTATIN SODIUM 40 MG PO TABS
40.0000 mg | ORAL_TABLET | Freq: Every day | ORAL | Status: DC
  Administered 2019-03-03 – 2019-03-04 (×3): 40 mg via ORAL
  Filled 2019-03-02 (×4): qty 1

## 2019-03-02 MED ORDER — SODIUM CHLORIDE 0.9 % IV BOLUS: 500.0000 mL | Freq: Once | INTRAVENOUS | Status: DC

## 2019-03-02 MED ORDER — ACETAMINOPHEN 650 MG RE SUPP: 650.0000 mg | Freq: Four times a day (QID) | RECTAL | Status: DC | PRN

## 2019-03-02 MED ORDER — SODIUM CHLORIDE 0.9 % IV SOLN
500.0000 mg | Freq: Once | INTRAVENOUS | Status: AC
  Administered 2019-03-02: 20:00:00 500 mg via INTRAVENOUS
  Filled 2019-03-02: qty 500

## 2019-03-02 MED ORDER — ONDANSETRON HCL 4 MG PO TABS
4.0000 mg | ORAL_TABLET | Freq: Four times a day (QID) | ORAL | Status: DC | PRN
  Filled 2019-03-02: qty 1

## 2019-03-02 MED ORDER — ONDANSETRON HCL 4 MG/2ML IJ SOLN: 4.0000 mg | Freq: Four times a day (QID) | INTRAMUSCULAR | Status: DC | PRN

## 2019-03-02 MED ORDER — INSULIN DEGLUDEC 200 UNIT/ML ~~LOC~~ SOPN: 30.0000 [IU] | PEN_INJECTOR | Freq: Every day | SUBCUTANEOUS | Status: DC

## 2019-03-02 MED ORDER — ENOXAPARIN SODIUM 40 MG/0.4ML ~~LOC~~ SOLN
40.0000 mg | SUBCUTANEOUS | Status: DC
  Administered 2019-03-03 – 2019-03-05 (×3): 40 mg via SUBCUTANEOUS
  Filled 2019-03-02 (×3): qty 0.4

## 2019-03-02 MED ORDER — GABAPENTIN 100 MG PO CAPS
100.0000 mg | ORAL_CAPSULE | Freq: Three times a day (TID) | ORAL | Status: DC
  Administered 2019-03-03 – 2019-03-05 (×7): 100 mg via ORAL
  Filled 2019-03-02 (×7): qty 1

## 2019-03-02 MED ORDER — ACETAMINOPHEN 325 MG PO TABS
650.0000 mg | ORAL_TABLET | Freq: Four times a day (QID) | ORAL | Status: DC | PRN
  Administered 2019-03-03: 03:00:00 650 mg via ORAL
  Filled 2019-03-02: qty 2

## 2019-03-02 NOTE — ED Provider Notes (Signed)
[facility] EMERGENCY DEPARTMENT Provider Note   CSN: [id number] Arrival date & time: 03/02/19  1608    History   Chief Complaint No chief complaint on file.   HPI Teresa Grant is a 58 y.o. female.     58 year old female with prior medical history as detailed below presents for evaluation of cough, mild shortness of breath, and fever.  She reports onset of symptoms approximately 5 days ago.  She denies known exposure to cope with positive patient.  She denies other sick contacts at home.  She lives with her husband who is working as a Administrator.  She denies chest pain.  She denies associated nausea, vomiting, or abdominal pain.    The history is provided by the patient and medical records.  Fever  Max temp prior to arrival:  101 Temp source:  Oral Severity:  Mild Onset quality:  Gradual Duration:  5 days Timing:  Constant Progression:  Worsening Chronicity:  New Relieved by:  Nothing Worsened by:  Nothing Associated symptoms: cough     Past Medical History:  Diagnosis Date  . Diabetes mellitus   . Dysrhythmia    1985  . Heart murmur   . HLD (hyperlipidemia)   . Hypertension   . Kidney stone on right side 03/2013  . Pneumonia    7 yrs ago    Patient Active Problem List   Diagnosis Date Noted  . Tension headache 11/13/2018  . Cervical radiculopathy 11/13/2018  . Chest pain 07/10/2014  . Abdominal pain 07/10/2014  . Type II or unspecified type diabetes mellitus without mention of complication, uncontrolled 09/05/2013  . Renal calculus, right 06/12/2013  . Diabetes with proteinuria 11/05/2012  . DDD (degenerative disc disease), lumbar 06/23/2012  . HYPERLIPIDEMIA 11/27/2006  . Essential hypertension 11/27/2006    Past Surgical History:  Procedure Laterality Date  . ABDOMINAL HYSTERECTOMY  2001  . BACK SURGERY    . CESAREAN SECTION  1990  . COLONOSCOPY     Dr. Deatra Ina normal  . LUMBAR LAMINECTOMY/DECOMPRESSION MICRODISCECTOMY   03/20/2012   Procedure: LUMBAR LAMINECTOMY/DECOMPRESSION MICRODISCECTOMY;  Surgeon: Sinclair Ship, MD;  Location: [facility];  Service: Orthopedics;  Laterality: Left;  Left sided lumbar 4-5 microdisectomy  . SPINE SURGERY  2013     OB History   No obstetric history on file.      Home Medications    Prior to Admission medications   Medication Sig Start Date End Date Taking? Authorizing Provider  amLODipine (NORVASC) 10 MG tablet Take 1 tablet (10 mg total) by mouth at bedtime. 01/05/14   Collene Leyden, PA-C  cyclobenzaprine (FLEXERIL) 10 MG tablet Take 1 tablet (10 mg total) by mouth at bedtime. Please add prn Patient not taking: Reported on 01/15/2019 11/13/18   Glendale Chard, MD  gabapentin (NEURONTIN) 100 MG capsule TAKE ONE CAPSULE BY MOUTH THREE TIMES A DAY 02/19/19   Glendale Chard, MD  glucose blood [facility] VERIO) test strip Use as directed to check blood sugars 2 times per day dx: e11.65 09/13/18   Glendale Chard, MD  ibuprofen (ADVIL) 400 MG tablet  12/23/18   [provider]  JANUVIA 100 MG tablet TAKE ONE TABLET BY MOUTH DAILY 12/24/18   Glendale Chard, MD  metFORMIN (GLUCOPHAGE) 1000 MG tablet TAKE 1 TABLET BY MOUTH TWO TIMES A DAY WITH MORNING AND EVENING MEALS 01/17/19   Glendale Chard, MD  OZEMPIC, 0.25 OR 0.5 MG/DOSE, 2 MG/1.5ML SOPN DIAL AND INJECT SUBCUTANEOUSLY 0.5 MG WEEKLY  12/19/18   Glendale Chard, MD  pravastatin (PRAVACHOL) 40 MG tablet TAKE ONE TABLET BY MOUTH AT BEDTIME 02/20/19   Glendale Chard, MD  telmisartan (MICARDIS) 20 MG tablet TAKE 1 TABLET BY MOUTH DAILY 11/26/18   Glendale Chard, MD  TRESIBA FLEXTOUCH 200 UNIT/ML SOPN INJECT 51 UNITS UNDER SKIN AT BEDTIME (INCREASE AS DIRECTED) NOT TO EXCEED 100 UNITS Patient taking differently: 44 Units.  09/11/18   Glendale Chard, MD    Family History Family History  Problem Relation Age of Onset  . Heart disease Mother   . Kidney disease Father   . Esophageal cancer Maternal Uncle   . Colon cancer  Cousin   . Stroke Neg Hx   . Cancer Neg Hx   . Colon polyps Neg Hx   . Rectal cancer Neg Hx   . Stomach cancer Neg Hx     Social History Social History   Tobacco Use  . Smoking status: Former Smoker    Packs/day: 0.50    Years: 25.00    Pack years: 12.50    Last attempt to quit: 04/29/2004    Years since quitting: 14.8  . Smokeless tobacco: Never Used  Substance Use Topics  . Alcohol use: No    Frequency: Never  . Drug use: No     Allergies   Lisinopril and Hydrochlorothiazide   Review of Systems Review of Systems  Constitutional: Positive for fever.  Respiratory: Positive for cough.   All other systems reviewed and are negative.    Physical Exam Updated Vital Signs BP (!) 157/81   Pulse (!) 105   Temp (!) 101.9 F (38.8 C) (Oral)   Resp (!) 22   SpO2 98%   Physical Exam Vitals signs and nursing note reviewed.  Constitutional:      General: She is not in acute distress.    Appearance: Normal appearance. She is well-developed.  HENT:     Head: Normocephalic and atraumatic.  Eyes:     Conjunctiva/sclera: Conjunctivae normal.     Pupils: Pupils are equal, round, and reactive to light.  Neck:     Musculoskeletal: Normal range of motion and neck supple.  Cardiovascular:     Rate and Rhythm: Normal rate and regular rhythm.     Heart sounds: Normal heart sounds.  Pulmonary:     Effort: Pulmonary effort is normal. No respiratory distress.     Breath sounds: Normal breath sounds.  Abdominal:     General: There is no distension.     Palpations: Abdomen is soft.     Tenderness: There is no abdominal tenderness.  Musculoskeletal: Normal range of motion.        General: No deformity.  Skin:    General: Skin is warm and dry.  Neurological:     Mental Status: She is alert and oriented to person, place, and time.      ED Treatments / Results  Labs (all labs ordered are listed, but only abnormal results are displayed) Labs Reviewed  URINALYSIS, ROUTINE  W REFLEX MICROSCOPIC - Abnormal; Notable for the following components:      Result Value   Protein, ur >=300 (*)    All other components within normal limits  COMPREHENSIVE METABOLIC PANEL - Abnormal; Notable for the following components:   Potassium 3.2 (*)    Glucose, Bld 146 (*)    Creatinine, Ser 1.13 (*)    Calcium 8.6 (*)    Albumin 3.1 (*)    GFR calc non Af  Amer 54 (*)    All other components within normal limits  CBC WITH DIFFERENTIAL/PLATELET - Abnormal; Notable for the following components:   Hemoglobin 11.4 (*)    All other components within normal limits  CBG MONITORING, ED - Abnormal; Notable for the following components:   Glucose-Capillary 132 (*)    All other components within normal limits  SARS CORONAVIRUS 2 (HOSPITAL ORDER, [facility])  CULTURE, BLOOD (ROUTINE X 2)  CULTURE, BLOOD (ROUTINE X 2)  LACTIC ACID, PLASMA  LACTIC ACID, PLASMA    EKG None  Radiology Dg Chest Port 1 View  Result Date: 03/02/2019 CLINICAL DATA:  Fever.  Hypertension.  Diabetic.  Nonsmoker.  Cough. EXAM: PORTABLE CHEST 1 VIEW COMPARISON:  08/29/2016 FINDINGS: Midline trachea. Normal heart size. No pleural effusion or pneumothorax. Especially when compared to 08/29/2016, bibasilar patchy opacities versus confluent interstitial thickening identified. The upper lungs are clear. IMPRESSION: Bibasilar interstitial thickening versus airspace disease. Cannot exclude multifocal pneumonia. Consider PA and lateral radiographs. Electronically Signed   By: Abigail Miyamoto M.D.   On: 03/02/2019 18:00    Procedures Procedures (including critical care time)  Medications Ordered in ED Medications  acetaminophen (TYLENOL) tablet 650 mg (has no administration in time range)  sodium chloride 0.9 % bolus 500 mL (has no administration in time range)     Initial Impression / Assessment and Plan / ED Course  I have reviewed the triage vital signs and the nursing notes.   Pertinent labs & imaging results that were available during my care of the patient were reviewed by me and considered in my medical decision making (see chart for details).        MDM  Screen complete  SHARAY BELLISSIMO was evaluated in Emergency Department on 03/02/2019 for the symptoms described in the history of present illness. She was evaluated in the context of the global COVID-19 pandemic, which necessitated consideration that the patient might be at risk for infection with the SARS-CoV-2 virus that causes COVID-19. Institutional protocols and algorithms that pertain to the evaluation of patients at risk for COVID-19 are in a state of rapid change based on information released by regulatory bodies including the CDC and federal and state organizations. These policies and algorithms were followed during the patient's care in the ED.  Patient is presenting for evaluation of cough and fever.  Patient presentation is consistent with likely pneumonia.  Given her comorbidities she is at high risk for progression to serious illness.  Broad-spectrum antibiotics given in the ED.  COVID screen is pending at this time (lab reports results for code screen should be available at by approximately 2000).  Hospitalist service is aware of case and will evaluate for admission.  Final Clinical Impressions(s) / ED Diagnoses   Final diagnoses:  Community acquired pneumonia, unspecified laterality    ED Discharge Orders    None       Valarie Merino, MD 03/02/19 [place]

## 2019-03-02 NOTE — ED Notes (Signed)
ED TO INPATIENT HANDOFF REPORT  ED Nurse Name and Phone #: Caprice Kluver [phone]  S Name/Age/Gender Teresa Grant 58 y.o. female Room/Bed: 038C/038C  Code Status   Code Status: Full Code  Home/SNF/Other Home Patient oriented to: self, place, time and situation Is this baseline? Yes   Triage Complete: Triage complete  Chief Complaint SOB/ FEVER/BODY ACHE/ COUGH  Triage Note Pt presents with SHOB, Fever, and body aches for about a week. Pt also says she has had nausea a few times this week but no vomiting. Pt reports chest tightness and she can not take deep breaths. Pt reports fever begins about 5pm and lasts throughout the night.    Allergies Allergies  Allergen Reactions  . Lisinopril Cough  . Hydrochlorothiazide Other (See Comments)    pancreatitis    Level of Care/Admitting Diagnosis ED Disposition    ED Disposition Condition [facility]: [place] [facility] [[id number]]  Level of Care: Telemetry [5]  Covid Evaluation: Confirmed COVID Positive  Isolation Risk Level: Low Risk/Droplet (Less than 4L [place] supplementation)  Diagnosis: Pneumonia due to COVID-19 virus [[id number]]  Admitting Physician: Teresa Grant [phone]  Attending Physician: Teresa Grant [phone]  Estimated length of stay: past midnight tomorrow  Certification:: I certify this patient will need inpatient services for at least 2 midnights  PT Class (Do Not Modify): Inpatient [101]  PT Acc Code (Do Not Modify): Private [1]       B Medical/Surgery History Past Medical History:  Diagnosis Date  . Diabetes mellitus   . Dysrhythmia    1985  . Heart murmur   . HLD (hyperlipidemia)   . Hypertension   . Kidney stone on right side 03/2013  . Pneumonia    7 yrs ago   Past Surgical History:  Procedure Laterality Date  . ABDOMINAL HYSTERECTOMY  2001  . BACK SURGERY    . CESAREAN SECTION  1990  . COLONOSCOPY     Dr. Deatra Ina normal  . LUMBAR  LAMINECTOMY/DECOMPRESSION MICRODISCECTOMY  03/20/2012   Procedure: LUMBAR LAMINECTOMY/DECOMPRESSION MICRODISCECTOMY;  Surgeon: Sinclair Ship, MD;  Location: [facility];  Service: Orthopedics;  Laterality: Left;  Left sided lumbar 4-5 microdisectomy  . SPINE SURGERY  2013     A IV Location/Drains/Wounds Patient Lines/Drains/Airways Status   Active Line/Drains/Airways    Name:   Placement date:   Placement time:   Site:   Days:   Peripheral IV 03/02/19 Left;Posterior Forearm   03/02/19    1842    Forearm   less than 1   Incision 03/20/12 Back   03/20/12    1435     2538          Intake/Output Last 24 hours  Intake/Output Summary (Last 24 hours) at 03/02/2019 2152 Last data filed at 03/02/2019 2006 Gross per 24 hour  Intake 100 ml  Output -  Net 100 ml    Labs/Imaging Results for orders placed or performed during the hospital encounter of 03/02/19 (from the past 48 hour(s))  Comprehensive metabolic panel     Status: Abnormal   Collection Time: 03/02/19  5:20 PM  Result Value Ref Range   Sodium 140 135 - 145 mmol/L   Potassium 3.2 (L) 3.5 - 5.1 mmol/L   Chloride 106 98 - 111 mmol/L   CO2 24 22 - 32 mmol/L   Glucose, Bld 146 (H) 70 - 99 mg/dL   BUN 9 6 - 20 mg/dL   Creatinine, Ser  1.13 (H) 0.44 - 1.00 mg/dL   Calcium 8.6 (L) 8.9 - 10.3 mg/dL   Total Protein 7.5 6.5 - 8.1 g/dL   Albumin 3.1 (L) 3.5 - 5.0 g/dL   AST 32 15 - 41 U/L   ALT 30 0 - 44 U/L   Alkaline Phosphatase 97 38 - 126 U/L   Total Bilirubin 0.6 0.3 - 1.2 mg/dL   GFR calc non Af Amer 54 (L) >60 mL/min   GFR calc Af Amer >60 >60 mL/min   Anion gap 10 5 - 15    Comment: Performed at [facility] [address] [place], [place] [address]  CBC with Differential     Status: Abnormal   Collection Time: 03/02/19  5:20 PM  Result Value Ref Range   WBC 6.3 4.0 - 10.5 K/uL   RBC 4.34 3.87 - 5.11 MIL/uL   Hemoglobin 11.4 (L) 12.0 - 15.0 g/dL   HCT 36.5 36.0 - 46.0 %   MCV 84.1 80.0 - 100.0 fL   MCH 26.3  26.0 - 34.0 pg   MCHC 31.2 30.0 - 36.0 g/dL   RDW 13.7 11.5 - 15.5 %   Platelets 227 150 - 400 K/uL   nRBC 0.0 0.0 - 0.2 %   Neutrophils Relative % 67 %   Neutro Abs 4.3 1.7 - 7.7 K/uL   Lymphocytes Relative 27 %   Lymphs Abs 1.7 0.7 - 4.0 K/uL   Monocytes Relative 5 %   Monocytes Absolute 0.3 0.1 - 1.0 K/uL   Eosinophils Relative 0 %   Eosinophils Absolute 0.0 0.0 - 0.5 K/uL   Basophils Relative 0 %   Basophils Absolute 0.0 0.0 - 0.1 K/uL   Immature Granulocytes 1 %   Abs Immature Granulocytes 0.04 0.00 - 0.07 K/uL    Comment: Performed at [facility] [address] [place], [place] [address]  Lactic acid, plasma     Status: None   Collection Time: 03/02/19  5:20 PM  Result Value Ref Range   Lactic Acid, Venous 1.5 0.5 - 1.9 mmol/L    Comment: Performed at [facility] [address], [place], [place] [address]  SARS Coronavirus 2 (CEPHEID- Performed in [facility] hospital lab), Hosp Order     Status: Abnormal   Collection Time: 03/02/19  5:46 PM  Result Value Ref Range   SARS Coronavirus 2 POSITIVE (A) NEGATIVE    Comment: RESULT CALLED TO, READ BACK BY AND VERIFIED WITH: S Yuridiana Formanek,RN AT 1942 03/02/2019 BY L BENFIELD (NOTE) If result is NEGATIVE SARS-CoV-2 target nucleic acids are NOT DETECTED. The SARS-CoV-2 RNA is generally detectable in upper and lower  respiratory specimens during the acute phase of infection. The lowest  concentration of SARS-CoV-2 viral copies this assay can detect is 250  copies / mL. A negative result does not preclude SARS-CoV-2 infection  and should not be used as the sole basis for treatment or other  patient management decisions.  A negative result may occur with  improper specimen collection / handling, submission of specimen other  than nasopharyngeal swab, presence of viral mutation(s) within the  areas targeted by this assay, and inadequate number of viral copies  (<250 copies / mL). A negative result must be combined with  clinical  observations, patient history, and epidemiological information. If result is POSITIVE SARS-CoV-2 target nucleic acids are DETECTE D. The SARS-CoV-2 RNA is generally detectable in upper and lower  respiratory specimens during the acute phase  of infection.  Positive  results are indicative of active infection with SARS-CoV-2.  Clinical  correlation with patient history and other diagnostic information is  necessary to determine patient infection status.  Positive results do  not rule out bacterial infection or co-infection with other viruses. If result is PRESUMPTIVE POSTIVE SARS-CoV-2 nucleic acids MAY BE PRESENT.   A presumptive positive result was obtained on the submitted specimen  and confirmed on repeat testing.  While 2019 novel coronavirus  (SARS-CoV-2) nucleic acids may be present in the submitted sample  additional confirmatory testing may be necessary for epidemiological  and / or clinical management purposes  to differentiate between  SARS-CoV-2 and other Sarbecovirus currently known to infect humans.  If clinically indicated additional testing with an alternate test  methodology (LAB745 3) is advised. The SARS-CoV-2 RNA is generally  detectable in upper and lower respiratory specimens during the acute  phase of infection. The expected result is Negative. Fact Sheet for Patients:  StrictlyIdeas.no Fact Sheet for Healthcare Providers: BankingDealers.co.za This test is not yet approved or cleared by the [place] FDA and has been authorized for detection and/or diagnosis of SARS-CoV-2 by FDA under an Emergency Use Authorization (EUA).  This EUA will remain in effect (meaning this test can be used) for the duration of the COVID-19 declaration under Section 564(b)(1) of the Act, 21 U.S.C. section 360bbb-3(b)(1), unless the authorization is terminated or revoked sooner. Performed at [facility], [place]  [address] [place], [place] [address]   Urinalysis, Routine w reflex microscopic     Status: Abnormal   Collection Time: 03/02/19  5:49 PM  Result Value Ref Range   Color, Urine YELLOW YELLOW   APPearance CLEAR CLEAR   Specific Gravity, Urine 1.016 1.005 - 1.030   pH 5.0 5.0 - 8.0   Glucose, UA NEGATIVE NEGATIVE mg/dL   Hgb urine dipstick NEGATIVE NEGATIVE   Bilirubin Urine NEGATIVE NEGATIVE   Ketones, ur NEGATIVE NEGATIVE mg/dL   Protein, ur >=300 (A) NEGATIVE mg/dL   Nitrite NEGATIVE NEGATIVE   Leukocytes,Ua NEGATIVE NEGATIVE   RBC / HPF 0-5 0 - 5 RBC/hpf   WBC, UA 0-5 0 - 5 WBC/hpf   Bacteria, UA NONE SEEN NONE SEEN   Squamous Epithelial / LPF 0-5 0 - 5   Hyaline Casts, UA PRESENT     Comment: Performed at [facility], [place] [address], [place], [place] [address]  CBG monitoring, ED     Status: Abnormal   Collection Time: 03/02/19  6:40 PM  Result Value Ref Range   Glucose-Capillary 132 (H) 70 - 99 mg/dL   Dg Chest Port 1 View  Result Date: 03/02/2019 CLINICAL DATA:  Fever.  Hypertension.  Diabetic.  Nonsmoker.  Cough. EXAM: PORTABLE CHEST 1 VIEW COMPARISON:  08/29/2016 FINDINGS: Midline trachea. Normal heart size. No pleural effusion or pneumothorax. Especially when compared to 08/29/2016, bibasilar patchy opacities versus confluent interstitial thickening identified. The upper lungs are clear. IMPRESSION: Bibasilar interstitial thickening versus airspace disease. Cannot exclude multifocal pneumonia. Consider PA and lateral radiographs. Electronically Signed   By: Abigail Miyamoto M.D.   On: 03/02/2019 18:00    Pending Labs Unresulted Labs (From admission, onward)    Start     Ordered   03/09/19 0500  Creatinine, serum  (enoxaparin (LOVENOX)    CrCl >/= 30 ml/min)  Weekly,   R    Comments:  while on enoxaparin therapy    03/02/19 2142   03/03/19 0500  HIV  antibody (Routine Testing)  Tomorrow morning,   R     03/02/19 2142   03/03/19 0500  ABO/Rh  Once,   R     03/02/19 2144    03/03/19 0500  Comprehensive metabolic panel  Tomorrow morning,   R     03/02/19 2144   03/03/19 0500  C-reactive protein  Tomorrow morning,   R     03/02/19 2144   03/03/19 0500  Ferritin  Tomorrow morning,   R     03/02/19 2144   03/03/19 0500  Lactate dehydrogenase  Tomorrow morning,   R     03/02/19 2144   03/03/19 0500  Procalcitonin  Tomorrow morning,   R     03/02/19 2144   03/02/19 2142  CBC  (enoxaparin (LOVENOX)    CrCl >/= 30 ml/min)  Once,   R    Comments:  Baseline for enoxaparin therapy IF NOT ALREADY DRAWN.  Notify MD if PLT < 100 K.    03/02/19 2142   03/02/19 2142  Creatinine, serum  (enoxaparin (LOVENOX)    CrCl >/= 30 ml/min)  Once,   R    Comments:  Baseline for enoxaparin therapy IF NOT ALREADY DRAWN.    03/02/19 2142   03/02/19 1631  Culture, blood (routine x 2)  BLOOD CULTURE X 2,   STAT     03/02/19 1631          Vitals/Pain Today's Vitals   03/02/19 1715 03/02/19 1800 03/02/19 1842 03/02/19 1924  BP:  (!) 141/79  (!) 141/79  Pulse:  (!) 103  98  Resp:  (!) 21  18  Temp:   (!) 102.7 F (39.3 C)   TempSrc:   Oral   SpO2:  95%  96%  Weight: 78.9 kg     Height: 5\' 1"  (1.549 m)     PainSc: 8        Isolation Precautions Droplet and Contact precautions  Medications Medications  sodium chloride 0.9 % bolus 500 mL (0 mLs Intravenous Hold 03/02/19 1913)  amLODipine (NORVASC) tablet 10 mg (has no administration in time range)  pravastatin (PRAVACHOL) tablet 40 mg (has no administration in time range)  irbesartan (AVAPRO) tablet 75 mg (has no administration in time range)  Insulin Degludec SOPN 30 Units (has no administration in time range)  gabapentin (NEURONTIN) capsule 100 mg (has no administration in time range)  acetaminophen (TYLENOL) tablet 650 mg (has no administration in time range)    Or  acetaminophen (TYLENOL) suppository 650 mg (has no administration in time range)  ondansetron (ZOFRAN) tablet 4 mg (has no administration in time range)     Or  ondansetron (ZOFRAN) injection 4 mg (has no administration in time range)  insulin aspart (novoLOG) injection 0-9 Units (has no administration in time range)  enoxaparin (LOVENOX) injection 40 mg (has no administration in time range)  acetaminophen (TYLENOL) tablet 650 mg (650 mg Oral Given 03/02/19 1720)  cefTRIAXone (ROCEPHIN) 1 g in sodium chloride 0.9 % 100 mL IVPB (0 g Intravenous Stopped 03/02/19 2006)  azithromycin (ZITHROMAX) 500 mg in sodium chloride 0.9 % 250 mL IVPB (500 mg Intravenous New Bag/Given 03/02/19 2013)    Mobility walks Low fall risk   Focused Assessments NA   R Recommendations: See Admitting Provider Note  Report given to:   Additional Notes:

## 2019-03-02 NOTE — ED Notes (Signed)
Called [facility] to transport, they are aware.

## 2019-03-02 NOTE — H&P (Signed)
History and Physical    Teresa Grant UUE:[id number] DOB: 04-29-1961 DOA: 03/02/2019  PCP: Glendale Chard, MD   Patient coming from: Home.  Chief Complaint: Fever cough and shortness of breath.  HPI: Teresa Grant is a 58 y.o. female with history of diabetes mellitus type 2, hypertension, hyperlipidemia has been experiencing subjective feeling of fever chills cough and shortness of breath over the last 5 days.  Denies any sick contacts.  Since patient symptoms are not getting better patient came to the ER.  Denies any chest pain has been exam nausea and poor appetite but has been able to eat but not her usual self but denies any diarrhea.  No abdominal pain.  Has been taking ibuprofen for a long time for low back pain.  ED Course: In the ER patient febrile tachycardic with temperature around 102.7 F heart rate was in the 105 respiration initially was 36/min but improved.  Chest x-ray showed multifocal pneumonia.  COVID-19 was positive.  Patient initially started antibiotics for community-acquired pneumonia.  Patient admitted for further management of COVID-19 with SIRS features.  Review of Systems: As per HPI, rest all negative.   Past Medical History:  Diagnosis Date   Diabetes mellitus    Dysrhythmia    1985   Heart murmur    HLD (hyperlipidemia)    Hypertension    Kidney stone on right side 03/2013   Pneumonia    7 yrs ago    Past Surgical History:  Procedure Laterality Date   ABDOMINAL HYSTERECTOMY  2001   BACK SURGERY     CESAREAN SECTION  1990   COLONOSCOPY     Dr. Deatra Ina normal   LUMBAR LAMINECTOMY/DECOMPRESSION MICRODISCECTOMY  03/20/2012   Procedure: LUMBAR LAMINECTOMY/DECOMPRESSION MICRODISCECTOMY;  Surgeon: Sinclair Ship, MD;  Location: [facility];  Service: Orthopedics;  Laterality: Left;  Left sided lumbar 4-5 microdisectomy   SPINE SURGERY  2013     reports that she quit smoking about 14 years ago. She has a 12.50 pack-year smoking  history. She has never used smokeless tobacco. She reports that she does not drink alcohol or use drugs.  Allergies  Allergen Reactions   Lisinopril Cough   Hydrochlorothiazide Other (See Comments)    pancreatitis    Family History  Problem Relation Age of Onset   Heart disease Mother    Kidney disease Father    Esophageal cancer Maternal Uncle    Colon cancer Cousin    Stroke Neg Hx    Cancer Neg Hx    Colon polyps Neg Hx    Rectal cancer Neg Hx    Stomach cancer Neg Hx     Prior to Admission medications   Medication Sig Start Date End Date Taking? Authorizing Provider  amLODipine (NORVASC) 10 MG tablet Take 1 tablet (10 mg total) by mouth at bedtime. 01/05/14  Yes Marte, Heather M, PA-C  fexofenadine (ALLEGRA) 60 MG tablet Take 60 mg by mouth at bedtime.   Yes [provider]  gabapentin (NEURONTIN) 100 MG capsule TAKE ONE CAPSULE BY MOUTH THREE TIMES A DAY Patient taking differently: Take 100 mg by mouth 3 (three) times daily.  02/19/19  Yes Glendale Chard, MD  ibuprofen (ADVIL) 400 MG tablet 400 mg 3 (three) times daily.  12/23/18  Yes [provider]  JANUVIA 100 MG tablet TAKE ONE TABLET BY MOUTH DAILY Patient taking differently: Take 100 mg by mouth at bedtime.  12/24/18  Yes Glendale Chard, MD  metFORMIN (GLUCOPHAGE) 1000  MG tablet TAKE 1 TABLET BY MOUTH TWO TIMES A DAY WITH MORNING AND EVENING MEALS Patient taking differently: Take 1,000 mg by mouth 2 (two) times daily with a meal. Morning and evening 01/17/19  Yes Glendale Chard, MD  OZEMPIC, 0.25 OR 0.5 MG/DOSE, 2 MG/1.5ML SOPN DIAL AND INJECT SUBCUTANEOUSLY 0.5 MG WEEKLY Patient taking differently: Inject 0.5 mg into the skin once a week.  12/19/18  Yes Glendale Chard, MD  pravastatin (PRAVACHOL) 40 MG tablet TAKE ONE TABLET BY MOUTH AT BEDTIME Patient taking differently: Take 40 mg by mouth at bedtime.  02/20/19  Yes Glendale Chard, MD  telmisartan (MICARDIS) 20 MG tablet TAKE 1 TABLET BY MOUTH  DAILY Patient taking differently: Take 20 mg by mouth at bedtime.  11/26/18  Yes Glendale Chard, MD  TRESIBA FLEXTOUCH 200 UNIT/ML SOPN INJECT 51 UNITS UNDER SKIN AT BEDTIME (INCREASE AS DIRECTED) NOT TO EXCEED 100 UNITS Patient taking differently: Inject 44 Units into the skin at bedtime. Do not exceed 100 units 09/11/18  Yes Glendale Chard, MD  cyclobenzaprine (FLEXERIL) 10 MG tablet Take 1 tablet (10 mg total) by mouth at bedtime. Please add prn Patient not taking: Reported on 01/15/2019 11/13/18   Glendale Chard, MD  glucose blood [facility] VERIO) test strip Use as directed to check blood sugars 2 times per day dx: e11.65 09/13/18   Glendale Chard, MD    Physical Exam: Vitals:   03/02/19 1715 03/02/19 1800 03/02/19 1842 03/02/19 1924  BP:  (!) 141/79  (!) 141/79  Pulse:  (!) 103  98  Resp:  (!) 21  18  Temp:   (!) 102.7 F (39.3 C)   TempSrc:   Oral   SpO2:  95%  96%  Weight: 78.9 kg     Height: 5\' 1"  (1.549 m)         Constitutional: Moderately built and nourished. Vitals:   03/02/19 1715 03/02/19 1800 03/02/19 1842 03/02/19 1924  BP:  (!) 141/79  (!) 141/79  Pulse:  (!) 103  98  Resp:  (!) 21  18  Temp:   (!) 102.7 F (39.3 C)   TempSrc:   Oral   SpO2:  95%  96%  Weight: 78.9 kg     Height: 5\' 1"  (1.549 m)      Eyes: Anicteric no pallor. ENMT: No discharge from the ears eyes nose or mouth. Neck: No mass felt.  No neck rigidity. Respiratory: No rhonchi or crepitations. Cardiovascular: S1-S2 heard. Abdomen: Soft nontender bowel sounds present. Musculoskeletal: No edema. Skin: No rash. Neurologic: Alert awake oriented to time place and person.  Moves all extremities. Psychiatric: Appears normal per normal affect.   Labs on Admission: I have personally reviewed following labs and imaging studies  CBC: Recent Labs  Lab 03/02/19 1720  WBC 6.3  NEUTROABS 4.3  HGB 11.4*  HCT 36.5  MCV 84.1  PLT 478   Basic Metabolic Panel: Recent Labs  Lab 03/02/19 1720    NA 140  K 3.2*  CL 106  CO2 24  GLUCOSE 146*  BUN 9  CREATININE 1.13*  CALCIUM 8.6*   GFR: Estimated Creatinine Clearance: 52.2 mL/min (A) (by C-G formula based on SCr of 1.13 mg/dL (H)). Liver Function Tests: Recent Labs  Lab 03/02/19 1720  AST 32  ALT 30  ALKPHOS 97  BILITOT 0.6  PROT 7.5  ALBUMIN 3.1*   No results for input(s): LIPASE, AMYLASE in the last 168 hours. No results for input(s): AMMONIA in the last 168  hours. Coagulation Profile: No results for input(s): INR, PROTIME in the last 168 hours. Cardiac Enzymes: No results for input(s): CKTOTAL, CKMB, CKMBINDEX, TROPONINI in the last 168 hours. BNP (last 3 results) No results for input(s): PROBNP in the last 8760 hours. HbA1C: No results for input(s): HGBA1C in the last 72 hours. CBG: Recent Labs  Lab 03/02/19 1840  GLUCAP 132*   Lipid Profile: No results for input(s): CHOL, HDL, LDLCALC, TRIG, CHOLHDL, LDLDIRECT in the last 72 hours. Thyroid Function Tests: No results for input(s): TSH, T4TOTAL, FREET4, T3FREE, THYROIDAB in the last 72 hours. Anemia Panel: No results for input(s): VITAMINB12, FOLATE, FERRITIN, TIBC, IRON, RETICCTPCT in the last 72 hours. Urine analysis:    Component Value Date/Time   COLORURINE YELLOW 03/02/2019 1749   APPEARANCEUR CLEAR 03/02/2019 1749   LABSPEC 1.016 03/02/2019 1749   PHURINE 5.0 03/02/2019 1749   GLUCOSEU NEGATIVE 03/02/2019 1749   GLUCOSEU NEG mg/dL 12/10/2008 2028   HGBUR NEGATIVE 03/02/2019 1749   HGBUR trace-intact 12/11/2007 1030   BILIRUBINUR NEGATIVE 03/02/2019 1749   BILIRUBINUR Negative 10/16/2018 1216   KETONESUR NEGATIVE 03/02/2019 1749   PROTEINUR >=300 (A) 03/02/2019 1749   UROBILINOGEN 0.2 10/16/2018 1216   UROBILINOGEN 1.0 06/17/2015 1223   NITRITE NEGATIVE 03/02/2019 1749   LEUKOCYTESUR NEGATIVE 03/02/2019 1749   Sepsis Labs: @LABRCNTIP (procalcitonin:4,lacticidven:4) ) Recent Results (from the past 240 hour(s))  SARS Coronavirus 2  (CEPHEID- Performed in [place] hospital lab), Hosp Order     Status: Abnormal   Collection Time: 03/02/19  5:46 PM  Result Value Ref Range Status   SARS Coronavirus 2 POSITIVE (A) NEGATIVE Final    Comment: RESULT CALLED TO, READ BACK BY AND VERIFIED WITH: S CRUZ,RN AT 1942 03/02/2019 BY L BENFIELD (NOTE) If result is NEGATIVE SARS-CoV-2 target nucleic acids are NOT DETECTED. The SARS-CoV-2 RNA is generally detectable in upper and lower  respiratory specimens during the acute phase of infection. The lowest  concentration of SARS-CoV-2 viral copies this assay can detect is 250  copies / mL. A negative result does not preclude SARS-CoV-2 infection  and should not be used as the sole basis for treatment or other  patient management decisions.  A negative result may occur with  improper specimen collection / handling, submission of specimen other  than nasopharyngeal swab, presence of viral mutation(s) within the  areas targeted by this assay, and inadequate number of viral copies  (<250 copies / mL). A negative result must be combined with clinical  observations, patient history, and epidemiological information. If result is POSITIVE SARS-CoV-2 target nucleic acids are DETECTE D. The SARS-CoV-2 RNA is generally detectable in upper and lower  respiratory specimens during the acute phase of infection.  Positive  results are indicative of active infection with SARS-CoV-2.  Clinical  correlation with patient history and other diagnostic information is  necessary to determine patient infection status.  Positive results do  not rule out bacterial infection or co-infection with other viruses. If result is PRESUMPTIVE POSTIVE SARS-CoV-2 nucleic acids MAY BE PRESENT.   A presumptive positive result was obtained on the submitted specimen  and confirmed on repeat testing.  While 2019 novel coronavirus  (SARS-CoV-2) nucleic acids may be present in the submitted sample  additional confirmatory  testing may be necessary for epidemiological  and / or clinical management purposes  to differentiate between  SARS-CoV-2 and other Sarbecovirus currently known to infect humans.  If clinically indicated additional testing with an alternate test  methodology (LAB745 3) is  advised. The SARS-CoV-2 RNA is generally  detectable in upper and lower respiratory specimens during the acute  phase of infection. The expected result is Negative. Fact Sheet for Patients:  StrictlyIdeas.no Fact Sheet for Healthcare Providers: BankingDealers.co.za This test is not yet approved or cleared by the [place] FDA and has been authorized for detection and/or diagnosis of SARS-CoV-2 by FDA under an Emergency Use Authorization (EUA).  This EUA will remain in effect (meaning this test can be used) for the duration of the COVID-19 declaration under Section 564(b)(1) of the Act, 21 U.S.C. section 360bbb-3(b)(1), unless the authorization is terminated or revoked sooner. Performed at [facility], [place] [address] [place], [place] [address]      Radiological Exams on Admission: Dg Chest Port 1 View  Result Date: 03/02/2019 CLINICAL DATA:  Fever.  Hypertension.  Diabetic.  Nonsmoker.  Cough. EXAM: PORTABLE CHEST 1 VIEW COMPARISON:  08/29/2016 FINDINGS: Midline trachea. Normal heart size. No pleural effusion or pneumothorax. Especially when compared to 08/29/2016, bibasilar patchy opacities versus confluent interstitial thickening identified. The upper lungs are clear. IMPRESSION: Bibasilar interstitial thickening versus airspace disease. Cannot exclude multifocal pneumonia. Consider PA and lateral radiographs. Electronically Signed   By: Abigail Miyamoto M.D.   On: 03/02/2019 18:00     Assessment/Plan Principal Problem:   Pneumonia due to COVID-19 virus Active Problems:   Essential hypertension   Diabetes mellitus type 2 in obese ([place])    1. SIRS secondary  to COVID pneumonia -will closely monitor for any worsening shortness of breath or development of hypoxia.  Check ferritin and CRP LDH procalcitonin. 2. Acute renal failure likely from poor oral intake.  Patient did receive 500 cc normal saline bolus in the ER.  Will hold off any further fluids given the COVID-19.  Patient is on Cozaar which may need to be held if there is any further increase in creatinine. 3. Diabetes mellitus type II on Tresiba patient usually takes 44 units in the night but I am decreasing the units to 30 because patient has been having decreased appetite and has been eating less than usual.  Follow CBG with sliding scale coverage. 4. Hypertension on amlodipine and Micardis.  May have to hold my card if there is further worsening in creatinine. 5. Anemia appears to be chronic follow CBC. 6. Hyperlipidemia on statins.   DVT prophylaxis: Lovenox. Code Status: Full code. Family Communication: Patient states she will discuss with her family. Disposition Plan: Home. Consults called: None. Admission status: Inpatient.   [name] MD [facility] Pager [phone].  If 7PM-7AM, please contact night-coverage [email or web address] Password [facility]  03/02/2019, 9:45 PM

## 2019-03-02 NOTE — ED Triage Notes (Signed)
Pt presents with SHOB, Fever, and body aches for about a week. Pt also says she has had nausea a few times this week but no vomiting. Pt reports chest tightness and she can not take deep breaths. Pt reports fever begins about 5pm and lasts throughout the night.

## 2019-03-02 NOTE — ED Notes (Signed)
Nadiah Corbit daughter -[phone]

## 2019-03-02 NOTE — ED Notes (Signed)
Pt able to contact family.

## 2019-03-02 NOTE — ED Notes (Signed)
CBG 132. 

## 2019-03-03 LAB — GLUCOSE, CAPILLARY
Glucose-Capillary: 157 mg/dL — ABNORMAL HIGH (ref 70–99)
Glucose-Capillary: 166 mg/dL — ABNORMAL HIGH (ref 70–99)
Glucose-Capillary: 227 mg/dL — ABNORMAL HIGH (ref 70–99)
Glucose-Capillary: 246 mg/dL — ABNORMAL HIGH (ref 70–99)
Glucose-Capillary: 346 mg/dL — ABNORMAL HIGH (ref 70–99)

## 2019-03-03 LAB — COMPREHENSIVE METABOLIC PANEL
ALT: 28 U/L (ref 0–44)
AST: 29 U/L (ref 15–41)
Albumin: 3.4 g/dL — ABNORMAL LOW (ref 3.5–5.0)
Alkaline Phosphatase: 103 U/L (ref 38–126)
Anion gap: 9 (ref 5–15)
BUN: 10 mg/dL (ref 6–20)
CO2: 24 mmol/L (ref 22–32)
Calcium: 8.8 mg/dL — ABNORMAL LOW (ref 8.9–10.3)
Chloride: 106 mmol/L (ref 98–111)
Creatinine, Ser: 0.98 mg/dL (ref 0.44–1.00)
GFR calc Af Amer: >60 mL/min (ref >60)
GFR calc non Af Amer: >60 mL/min (ref >60)
Glucose, Bld: 178 mg/dL — ABNORMAL HIGH (ref 70–99)
Potassium: 3.5 mmol/L (ref 3.5–5.1)
Sodium: 139 mmol/L (ref 135–145)
Total Bilirubin: 0.4 mg/dL (ref 0.3–1.2)
Total Protein: 7.8 g/dL (ref 6.5–8.1)

## 2019-03-03 LAB — CBG MONITORING, ED: Glucose-Capillary: 108 mg/dL — ABNORMAL HIGH (ref 70–99)

## 2019-03-03 LAB — FERRITIN: Ferritin: 128 ng/mL (ref 11–307)

## 2019-03-03 LAB — D-DIMER, QUANTITATIVE: D-Dimer, Quant: 1.09 ug/mL-FEU — ABNORMAL HIGH (ref 0.00–0.50)

## 2019-03-03 LAB — LACTATE DEHYDROGENASE: LDH: 260 U/L — ABNORMAL HIGH (ref 98–192)

## 2019-03-03 LAB — PROCALCITONIN: Procalcitonin: <0.1 ng/mL

## 2019-03-03 LAB — C-REACTIVE PROTEIN: CRP: 5.3 mg/dL — ABNORMAL HIGH (ref <1.0)

## 2019-03-03 MED ORDER — VITAMIN C 500 MG/5ML PO SYRP: 500.0000 mg | ORAL_SOLUTION | Freq: Every day | ORAL | Status: DC

## 2019-03-03 MED ORDER — INSULIN GLARGINE 100 UNIT/ML ~~LOC~~ SOLN
30.0000 [IU] | Freq: Every day | SUBCUTANEOUS | Status: DC
  Administered 2019-03-03: 22:00:00 30 [IU] via SUBCUTANEOUS
  Filled 2019-03-03: qty 0.3

## 2019-03-03 MED ORDER — VITAMIN C 500 MG PO TABS
500.0000 mg | ORAL_TABLET | Freq: Every day | ORAL | Status: DC
  Administered 2019-03-03 – 2019-03-05 (×3): 500 mg via ORAL
  Filled 2019-03-03 (×3): qty 1

## 2019-03-03 MED ORDER — METHYLPREDNISOLONE SODIUM SUCC 40 MG IJ SOLR
40.0000 mg | Freq: Two times a day (BID) | INTRAMUSCULAR | Status: DC
  Administered 2019-03-03: 40 mg via INTRAVENOUS
  Filled 2019-03-03: qty 1

## 2019-03-03 MED ORDER — ZINC SULFATE 220 (50 ZN) MG PO CAPS
220.0000 mg | ORAL_CAPSULE | Freq: Every day | ORAL | Status: DC
  Administered 2019-03-03 – 2019-03-05 (×3): 220 mg via ORAL
  Filled 2019-03-03 (×3): qty 1

## 2019-03-03 MED ORDER — METHYLPREDNISOLONE SODIUM SUCC 40 MG IJ SOLR
40.0000 mg | Freq: Two times a day (BID) | INTRAMUSCULAR | Status: DC
  Administered 2019-03-03 – 2019-03-05 (×4): 40 mg via INTRAVENOUS
  Filled 2019-03-03 (×4): qty 1

## 2019-03-03 NOTE — Progress Notes (Signed)
PROGRESS NOTE                                                                                                                                                                                                             Patient Demographics:    Teresa Grant, is a 58 y.o. female, DOB - August 05, 1961, WCH:[id number]  Outpatient Primary MD for the patient is Teresa Chard, MD    LOS - 1  No chief complaint on file.      Brief Narrative: Patient is a 58 y.o. female with PMHx of DM-2, HTN, dyslipidemia presented with fever, cough, exertional dyspnea-found to have COVID-19 viral pneumonia.   Subjective:    [name] today feels essentially the same as yesterday-febrile overnight-O2 saturation on room air remains in the mid 90s.  Continues to have spells of cough.   Assessment  & Plan :   COVID-19 of viral pneumonia: Although febrile-remains stable-O2 saturation on room air hovering around 94.  Claims she has dyspnea on exertion-we will ask nurse to ambulate to see if she drops O2 saturation.  Continue IV steroids-if her O2 saturation drops-she will qualify for Remdesivir.    COVID-19 Labs:  Recent Labs    03/03/19 0810  DDIMER 1.09*  FERRITIN 128  LDH 260*  CRP 5.3*    Lab Results  Component Value Date   SARSCOV2NAA POSITIVE (A) 03/02/2019     COVID-19 Medications: 6/8>> Solu-Medrol  Hypertension: Controlled-continue with amlodipine and Avapro.  DM-2: CBGs stable-continue Lantus 30 units daily and SSI.  Dyslipidemia: Continue statin    ABG:    Component Value Date/Time   HCO3 25.2 (H) 03/18/2007 0406   TCO2 24 12/07/2017 0831    Condition - Extremely Guarded/Stable  Family Communication  : None at bedside-patient awake/alert-we will update family herself  Code Status :  Full Code  Diet :  Diet Order            Diet heart healthy/carb modified Room service appropriate? Yes; Fluid  consistency: Thin  Diet effective now               Disposition Plan  :  Remain inpatient  Consults  :  None  Procedures  :   None  DVT Prophylaxis  :  Lovenox   Lab Results  Component Value Date   PLT 227  03/02/2019    Inpatient Medications  Scheduled Meds:  amLODipine  10 mg Oral QHS   enoxaparin (LOVENOX) injection  40 mg Subcutaneous Q24H   gabapentin  100 mg Oral TID   insulin aspart  0-9 Units Subcutaneous TID WC   insulin glargine  30 Units Subcutaneous QHS   irbesartan  75 mg Oral Daily   methylPREDNISolone (SOLU-MEDROL) injection  40 mg Intravenous Q12H   pravastatin  40 mg Oral QHS   vitamin C  500 mg Oral Daily   zinc sulfate  220 mg Oral Daily   Continuous Infusions:  sodium chloride Stopped (03/02/19 1913)   PRN Meds:.acetaminophen **OR** acetaminophen, ondansetron **OR** ondansetron (ZOFRAN) IV  Antibiotics  :    Anti-infectives (From admission, onward)   Start     Dose/Rate Route Frequency Ordered Stop   03/02/19 1815  cefTRIAXone (ROCEPHIN) 1 g in sodium chloride 0.9 % 100 mL IVPB     1 g 200 mL/hr over 30 Minutes Intravenous  Once 03/02/19 1804 03/02/19 2006   03/02/19 1815  azithromycin (ZITHROMAX) 500 mg in sodium chloride 0.9 % 250 mL IVPB     500 mg 250 mL/hr over 60 Minutes Intravenous  Once 03/02/19 1804 03/02/19 2113       Time Spent in minutes  25   Oren Binet M.D on 03/03/2019 at 11:27 AM  To page go to [email or web address] - use universal password  [facility] -  Office  [phone]  See all Orders from today for further details   Admit date - 03/02/2019    1    Objective:   Vitals:   03/03/19 0527 03/03/19 0600 03/03/19 0617 03/03/19 0831  BP:    126/87  Pulse: 94 92 92 94  Resp: 20 (!) 28 18 (!) 31  Temp: 98.8 F (37.1 C)   98.8 F (37.1 C)  TempSrc: Oral     SpO2: 96% 90% 91% 95%  Weight:      Height:        Wt Readings from Last 3 Encounters:  03/03/19 77.2 kg  01/15/19 74.8 kg  11/13/18  78.7 kg     Intake/Output Summary (Last 24 hours) at 03/03/2019 1127 Last data filed at 03/03/2019 0400 Gross per 24 hour  Intake 590 ml  Output 0 ml  Net 590 ml     Physical Exam Gen Exam:Alert awake-not in any distress HEENT:atraumatic, normocephalic Chest: B/L clear to auscultation anteriorly CVS:S1S2 regular Abdomen:soft non tender, non distended Extremities:no edema Neurology: Non focal Skin: no rash   Data Review:    CBC Recent Labs  Lab 03/02/19 1720  WBC 6.3  HGB 11.4*  HCT 36.5  PLT 227  MCV 84.1  MCH 26.3  MCHC 31.2  RDW 13.7  LYMPHSABS 1.7  MONOABS 0.3  EOSABS 0.0  BASOSABS 0.0    Chemistries  Recent Labs  Lab 03/02/19 1720 03/03/19 0810  NA 140 139  K 3.2* 3.5  CL 106 106  CO2 24 24  GLUCOSE 146* 178*  BUN 9 10  CREATININE 1.13* 0.98  CALCIUM 8.6* 8.8*  AST 32 29  ALT 30 28  ALKPHOS 97 103  BILITOT 0.6 0.4   ------------------------------------------------------------------------------------------------------------------ No results for input(s): CHOL, HDL, LDLCALC, TRIG, CHOLHDL, LDLDIRECT in the last 72 hours.  Lab Results  Component Value Date   HGBA1C 7.4 (H) 01/16/2019   ------------------------------------------------------------------------------------------------------------------ No results for input(s): TSH, T4TOTAL, T3FREE, THYROIDAB in the last 72 hours.  Invalid input(s): FREET3 ------------------------------------------------------------------------------------------------------------------ Recent Labs  03/03/19 0810  FERRITIN 128    Coagulation profile No results for input(s): INR, PROTIME in the last 168 hours.  Recent Labs    03/03/19 0810  DDIMER 1.09*    Cardiac Enzymes No results for input(s): CKMB, TROPONINI, MYOGLOBIN in the last 168 hours.  Invalid input(s): CK ------------------------------------------------------------------------------------------------------------------ No results found  for: BNP  Micro Results Recent Results (from the past 240 hour(s))  Culture, blood (routine x 2)     Status: None (Preliminary result)   Collection Time: 03/02/19  5:10 PM  Result Value Ref Range Status   Specimen Description BLOOD LEFT HAND  Final   Special Requests AEROBIC BOTTLE ONLY Blood Culture adequate volume  Final   Culture   Final    NO GROWTH < 24 HOURS Performed at [facility], [address] [place], [place] [address]    Report Status PENDING  Incomplete  Culture, blood (routine x 2)     Status: None (Preliminary result)   Collection Time: 03/02/19  5:20 PM  Result Value Ref Range Status   Specimen Description BLOOD LEFT HAND  Final   Special Requests   Final    AEROBIC BOTTLE ONLY Blood Culture results may not be optimal due to an inadequate volume of blood received in culture bottles   Culture   Final    NO GROWTH < 24 HOURS Performed at [facility], [place] [address], [place], [place] [address]    Report Status PENDING  Incomplete  SARS Coronavirus 2 (CEPHEID- Performed in [place] [facility] lab), Hosp Order     Status: Abnormal   Collection Time: 03/02/19  5:46 PM  Result Value Ref Range Status   SARS Coronavirus 2 POSITIVE (A) NEGATIVE Final    Comment: RESULT CALLED TO, READ BACK BY AND VERIFIED WITH: S CRUZ,RN AT 1942 03/02/2019 BY L BENFIELD (NOTE) If result is NEGATIVE SARS-CoV-2 target nucleic acids are NOT DETECTED. The SARS-CoV-2 RNA is generally detectable in upper and lower  respiratory specimens during the acute phase of infection. The lowest  concentration of SARS-CoV-2 viral copies this assay can detect is 250  copies / mL. A negative result does not preclude SARS-CoV-2 infection  and should not be used as the sole basis for treatment or other  patient management decisions.  A negative result may occur with  improper specimen collection / handling, submission of specimen other  than nasopharyngeal swab, presence of viral mutation(s)  within the  areas targeted by this assay, and inadequate number of viral copies  (<250 copies / mL). A negative result must be combined with clinical  observations, patient history, and epidemiological information. If result is POSITIVE SARS-CoV-2 target nucleic acids are DETECTE D. The SARS-CoV-2 RNA is generally detectable in upper and lower  respiratory specimens during the acute phase of infection.  Positive  results are indicative of active infection with SARS-CoV-2.  Clinical  correlation with patient history and other diagnostic information is  necessary to determine patient infection status.  Positive results do  not rule out bacterial infection or co-infection with other viruses. If result is PRESUMPTIVE POSTIVE SARS-CoV-2 nucleic acids MAY BE PRESENT.   A presumptive positive result was obtained on the submitted specimen  and confirmed on repeat testing.  While 2019 novel coronavirus  (SARS-CoV-2) nucleic acids may be present in the submitted sample  additional confirmatory testing may be necessary for epidemiological  and / or clinical management purposes  to differentiate between  SARS-CoV-2 and other  Sarbecovirus currently known to infect humans.  If clinically indicated additional testing with an alternate test  methodology (LAB745 3) is advised. The SARS-CoV-2 RNA is generally  detectable in upper and lower respiratory specimens during the acute  phase of infection. The expected result is Negative. Fact Sheet for Patients:  StrictlyIdeas.no Fact Sheet for Healthcare Providers: BankingDealers.co.za This test is not yet approved or cleared by the [place] FDA and has been authorized for detection and/or diagnosis of SARS-CoV-2 by FDA under an Emergency Use Authorization (EUA).  This EUA will remain in effect (meaning this test can be used) for the duration of the COVID-19 declaration under Section 564(b)(1) of the Act,  21 U.S.C. section 360bbb-3(b)(1), unless the authorization is terminated or revoked sooner. Performed at [facility], [place] [address] [place], [place] [address]     Radiology Reports Mr Cervical Spine Wo Contrast  Result Date: 03/01/2019 CLINICAL DATA:  Chronic right-sided neck pain with occasional right arm numbness. EXAM: MRI CERVICAL SPINE WITHOUT CONTRAST TECHNIQUE: Multiplanar, multisequence MR imaging of the cervical spine was performed. No intravenous contrast was administered. COMPARISON:  MRI cervical spine dated November 20, 2018. FINDINGS: Alignment: Unchanged straightening of the normal cervical lordosis. Vertebrae: No fracture, evidence of discitis, or bone lesion. Cord: Normal signal and morphology. Posterior Fossa, vertebral arteries, paraspinal tissues: Negative. Disc levels: C2-C3:  Negative. C3-C4: Unchanged mild disc bulging bilateral uncovertebral hypertrophy. Unchanged mild spinal canal stenosis. No neuroforaminal stenosis. C4-C5: Unchanged mild disc bulging with bilateral uncovertebral hypertrophy. Unchanged moderate spinal canal stenosis and severe bilateral neuroforaminal stenosis. C5-C6: Unchanged mild disc bulging and bilateral uncovertebral hypertrophy. Unchanged mild spinal canal stenosis and moderate to severe bilateral neuroforaminal stenosis. C6-C7: Unchanged mild disc bulging and minimal uncovertebral hypertrophy. Unchanged borderline mild spinal canal stenosis. Unchanged mild bilateral neuroforaminal stenosis. C7-T1: Unchanged small left paracentral disc protrusion with slight mass effect on the left ventral cord. No stenosis. IMPRESSION: 1. Multilevel cervical spondylosis as described above, similar to prior study. 2. 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 3. Unchanged moderate to severe bilateral neuroforaminal stenosis at C5-C6. Electronically Signed   By: Titus Dubin M.D.   On: 03/01/2019 15:20   Dg Chest Port  1 View  Result Date: 03/02/2019 CLINICAL DATA:  Fever.  Hypertension.  Diabetic.  Nonsmoker.  Cough. EXAM: PORTABLE CHEST 1 VIEW COMPARISON:  08/29/2016 FINDINGS: Midline trachea. Normal heart size. No pleural effusion or pneumothorax. Especially when compared to 08/29/2016, bibasilar patchy opacities versus confluent interstitial thickening identified. The upper lungs are clear. IMPRESSION: Bibasilar interstitial thickening versus airspace disease. Cannot exclude multifocal pneumonia. Consider PA and lateral radiographs. Electronically Signed   By: Abigail Miyamoto M.D.   On: 03/02/2019 18:00

## 2019-03-03 NOTE — Plan of Care (Signed)
Discussed with patient plan of care for the evening, pain management and importance of contacting relatives she has been in contact with recently with some teach back displayed.

## 2019-03-03 NOTE — ED Notes (Signed)
Pt given [place] sandwich and water to drink.

## 2019-03-04 LAB — HIV ANTIBODY (ROUTINE TESTING W REFLEX): HIV Screen 4th Generation wRfx: NONREACTIVE

## 2019-03-04 LAB — COMPREHENSIVE METABOLIC PANEL
ALT: 26 U/L (ref 0–44)
AST: 24 U/L (ref 15–41)
Albumin: 3.3 g/dL — ABNORMAL LOW (ref 3.5–5.0)
Alkaline Phosphatase: 99 U/L (ref 38–126)
Anion gap: 11 (ref 5–15)
BUN: 19 mg/dL (ref 6–20)
CO2: 22 mmol/L (ref 22–32)
Calcium: 8.9 mg/dL (ref 8.9–10.3)
Chloride: 102 mmol/L (ref 98–111)
Creatinine, Ser: 1.06 mg/dL — ABNORMAL HIGH (ref 0.44–1.00)
GFR calc Af Amer: >60 mL/min (ref >60)
GFR calc non Af Amer: 58 mL/min — ABNORMAL LOW (ref >60)
Glucose, Bld: 348 mg/dL — ABNORMAL HIGH (ref 70–99)
Potassium: 3.7 mmol/L (ref 3.5–5.1)
Sodium: 135 mmol/L (ref 135–145)
Total Bilirubin: 0.2 mg/dL — ABNORMAL LOW (ref 0.3–1.2)
Total Protein: 7.8 g/dL (ref 6.5–8.1)

## 2019-03-04 LAB — GLUCOSE, CAPILLARY
Glucose-Capillary: 254 mg/dL — ABNORMAL HIGH (ref 70–99)
Glucose-Capillary: 356 mg/dL — ABNORMAL HIGH (ref 70–99)
Glucose-Capillary: 346 mg/dL — ABNORMAL HIGH (ref 70–99)
Glucose-Capillary: 362 mg/dL — ABNORMAL HIGH (ref 70–99)
Glucose-Capillary: 253 mg/dL — ABNORMAL HIGH (ref 70–99)

## 2019-03-04 LAB — FERRITIN: Ferritin: 156 ng/mL (ref 11–307)

## 2019-03-04 LAB — D-DIMER, QUANTITATIVE: D-Dimer, Quant: 0.84 ug/mL-FEU — ABNORMAL HIGH (ref 0.00–0.50)

## 2019-03-04 LAB — C-REACTIVE PROTEIN: CRP: 3.6 mg/dL — ABNORMAL HIGH (ref <1.0)

## 2019-03-04 LAB — LACTATE DEHYDROGENASE: LDH: 251 U/L — ABNORMAL HIGH (ref 98–192)

## 2019-03-04 MED ORDER — INSULIN ASPART 100 UNIT/ML ~~LOC~~ SOLN
0.0000 [IU] | Freq: Three times a day (TID) | SUBCUTANEOUS | Status: DC
  Administered 2019-03-04: 20 [IU] via SUBCUTANEOUS
  Administered 2019-03-05: 11 [IU] via SUBCUTANEOUS
  Administered 2019-03-05: 7 [IU] via SUBCUTANEOUS

## 2019-03-04 MED ORDER — INSULIN ASPART 100 UNIT/ML ~~LOC~~ SOLN
4.0000 [IU] | Freq: Three times a day (TID) | SUBCUTANEOUS | Status: DC
  Administered 2019-03-04 – 2019-03-05 (×4): 4 [IU] via SUBCUTANEOUS

## 2019-03-04 MED ORDER — INSULIN GLARGINE 100 UNIT/ML ~~LOC~~ SOLN
40.0000 [IU] | Freq: Every day | SUBCUTANEOUS | Status: DC
  Administered 2019-03-04: 40 [IU] via SUBCUTANEOUS
  Filled 2019-03-04 (×2): qty 0.4

## 2019-03-04 NOTE — Progress Notes (Signed)
PROGRESS NOTE                                                                                                                                                                                                             Patient Demographics:    Teresa Grant, is a 58 y.o. female, DOB - 08-Jan-1961, VOJ:[id number]  Outpatient Primary MD for the patient is Glendale Chard, MD    LOS - 2  No chief complaint on file.      Brief Narrative: Patient is a 58 y.o. female with PMHx of DM-2, HTN, dyslipidemia presented with fever, cough, exertional dyspnea-found to have COVID-19 viral pneumonia.   Subjective:    Teresa Grant today feels better-afebrile this morning.  Denies chest pain or shortness of breath.   Assessment  & Plan :   COVID-19 of viral pneumonia: No longer febrile-O2 saturations remain in the high 90s on room air.  Given overall stability-and clinical improvement-holding off on starting Remdesivir (no evidence of hypoxia)-we will continue IV steroids for 1 more day-and continue to observe.  If she continues to clinically improve-plans are to discharge on 6/10.  Inflammatory markers are also slowly downtrending.  COVID-19 Labs:  Recent Labs    03/03/19 0810 03/04/19 0228  DDIMER 1.09* 0.84*  FERRITIN 128 156  LDH 260* 251*  CRP 5.3* 3.6*    Lab Results  Component Value Date   SARSCOV2NAA POSITIVE (A) 03/02/2019     COVID-19 Medications: 6/8>> Solu-Medrol  Hypertension: Controlled-continue with amlodipine and Avapro.  DM-2: CBGs uncontrolled as patient on steroids-increase Lantus to 40 units for now, change SSI to resistant scale-add 4 units of NovoLog with meals.  Follow  Dyslipidemia: Continue statin    ABG:    Component Value Date/Time   HCO3 25.2 (H) 03/18/2007 0406   TCO2 24 12/07/2017 0831    Condition - Extremely Guarded/Stable  Family Communication  : None at bedside-patient  awake/alert-we will update family herself  Code Status :  Full Code  Diet :  Diet Order            Diet heart healthy/carb modified Room service appropriate? Yes; Fluid consistency: Thin  Diet effective now               Disposition Plan  :  Remain inpatient-likely discharge on 6/10  Consults  :  None  Procedures  :   None  DVT Prophylaxis  :  Lovenox   Lab Results  Component Value Date   PLT 227 03/02/2019    Inpatient Medications  Scheduled Meds:  amLODipine  10 mg Oral QHS   enoxaparin (LOVENOX) injection  40 mg Subcutaneous Q24H   gabapentin  100 mg Oral TID   insulin aspart  0-9 Units Subcutaneous TID WC   insulin glargine  30 Units Subcutaneous QHS   irbesartan  75 mg Oral Daily   methylPREDNISolone (SOLU-MEDROL) injection  40 mg Intravenous Q12H   pravastatin  40 mg Oral QHS   vitamin C  500 mg Oral Daily   zinc sulfate  220 mg Oral Daily   Continuous Infusions:  sodium chloride Stopped (03/02/19 1913)   PRN Meds:.acetaminophen **OR** acetaminophen, ondansetron **OR** ondansetron (ZOFRAN) IV  Antibiotics  :    Anti-infectives (From admission, onward)   Start     Dose/Rate Route Frequency Ordered Stop   03/02/19 1815  cefTRIAXone (ROCEPHIN) 1 g in sodium chloride 0.9 % 100 mL IVPB     1 g 200 mL/hr over 30 Minutes Intravenous  Once 03/02/19 1804 03/02/19 2006   03/02/19 1815  azithromycin (ZITHROMAX) 500 mg in sodium chloride 0.9 % 250 mL IVPB     500 mg 250 mL/hr over 60 Minutes Intravenous  Once 03/02/19 1804 03/02/19 2113       Time Spent in minutes  25   Oren Binet M.D on 03/04/2019 at 11:58 AM  To page go to [email or web address] - use universal password  [facility] -  Office  [phone]  See all Orders from today for further details   Admit date - 03/02/2019    2    Objective:   Vitals:   03/04/19 0015 03/04/19 0448 03/04/19 0500 03/04/19 0800  BP: (!) 142/90 120/64  140/80  Pulse: 87 83  85  Resp: (!) 23 (!)  32  (!) 26  Temp: 98.7 F (37.1 C) 98.3 F (36.8 C)  98.5 F (36.9 C)  TempSrc: Oral Oral    SpO2: 96% 93%  95%  Weight:   77.9 kg   Height:        Wt Readings from Last 3 Encounters:  03/04/19 77.9 kg  01/15/19 74.8 kg  11/13/18 78.7 kg    No intake or output data in the 24 hours ending 03/04/19 1158   Physical Exam General appearance:Awake, alert, not in any distress.  Eyes:no scleral icterus. HEENT: Atraumatic and Normocephalic Neck: supple, no JVD. Resp:Good air entry bilaterally,no rales or rhonchi CVS: S1 S2 regular, no murmurs.  GI: Bowel sounds present, Non tender and not distended with no gaurding, rigidity or rebound. Extremities: B/L Lower Ext shows no edema, both legs are warm to touch Neurology:  Non focal Psychiatric: Normal judgment and insight. Normal mood. Musculoskeletal:No digital cyanosis Skin:No Rash, warm and dry Wounds:N/A   Data Review:    CBC Recent Labs  Lab 03/02/19 1720  WBC 6.3  HGB 11.4*  HCT 36.5  PLT 227  MCV 84.1  MCH 26.3  MCHC 31.2  RDW 13.7  LYMPHSABS 1.7  MONOABS 0.3  EOSABS 0.0  BASOSABS 0.0    Chemistries  Recent Labs  Lab 03/02/19 1720 03/03/19 0810 03/04/19 0228  NA 140 139 135  K 3.2* 3.5 3.7  CL 106 106 102  CO2 24 24 22   GLUCOSE 146* 178* 348*  BUN 9 10 19   CREATININE 1.13* 0.98 1.06*  CALCIUM  8.6* 8.8* 8.9  AST 32 29 24  ALT 30 28 26   ALKPHOS 97 103 99  BILITOT 0.6 0.4 0.2*   ------------------------------------------------------------------------------------------------------------------ No results for input(s): CHOL, HDL, LDLCALC, TRIG, CHOLHDL, LDLDIRECT in the last 72 hours.  Lab Results  Component Value Date   HGBA1C 7.4 (H) 01/16/2019   ------------------------------------------------------------------------------------------------------------------ No results for input(s): TSH, T4TOTAL, T3FREE, THYROIDAB in the last 72 hours.  Invalid input(s):  FREET3 ------------------------------------------------------------------------------------------------------------------ Recent Labs    03/03/19 0810 03/04/19 0228  FERRITIN 128 156    Coagulation profile No results for input(s): INR, PROTIME in the last 168 hours.  Recent Labs    03/03/19 0810 03/04/19 0228  DDIMER 1.09* 0.84*    Cardiac Enzymes No results for input(s): CKMB, TROPONINI, MYOGLOBIN in the last 168 hours.  Invalid input(s): CK ------------------------------------------------------------------------------------------------------------------ No results found for: BNP  Micro Results Recent Results (from the past 240 hour(s))  Culture, blood (routine x 2)     Status: None (Preliminary result)   Collection Time: 03/02/19  5:10 PM  Result Value Ref Range Status   Specimen Description BLOOD LEFT HAND  Final   Special Requests AEROBIC BOTTLE ONLY Blood Culture adequate volume  Final   Culture   Final    NO GROWTH 2 DAYS Performed at [facility], [address], [place], [place] [address]    Report Status PENDING  Incomplete  Culture, blood (routine x 2)     Status: None (Preliminary result)   Collection Time: 03/02/19  5:20 PM  Result Value Ref Range Status   Specimen Description BLOOD LEFT HAND  Final   Special Requests   Final    AEROBIC BOTTLE ONLY Blood Culture results may not be optimal due to an inadequate volume of blood received in culture bottles   Culture   Final    NO GROWTH 2 DAYS Performed at [facility], [place] [address], [place], [place] [address]    Report Status PENDING  Incomplete  SARS Coronavirus 2 (CEPHEID- Performed in [facility] hospital lab), Hosp Order     Status: Abnormal   Collection Time: 03/02/19  5:46 PM  Result Value Ref Range Status   SARS Coronavirus 2 POSITIVE (A) NEGATIVE Final    Comment: RESULT CALLED TO, READ BACK BY AND VERIFIED WITH: S CRUZ,RN AT 1942 03/02/2019 BY L BENFIELD (NOTE) If result is  NEGATIVE SARS-CoV-2 target nucleic acids are NOT DETECTED. The SARS-CoV-2 RNA is generally detectable in upper and lower  respiratory specimens during the acute phase of infection. The lowest  concentration of SARS-CoV-2 viral copies this assay can detect is 250  copies / mL. A negative result does not preclude SARS-CoV-2 infection  and should not be used as the sole basis for treatment or other  patient management decisions.  A negative result may occur with  improper specimen collection / handling, submission of specimen other  than nasopharyngeal swab, presence of viral mutation(s) within the  areas targeted by this assay, and inadequate number of viral copies  (<250 copies / mL). A negative result must be combined with clinical  observations, patient history, and epidemiological information. If result is POSITIVE SARS-CoV-2 target nucleic acids are DETECTE D. The SARS-CoV-2 RNA is generally detectable in upper and lower  respiratory specimens during the acute phase of infection.  Positive  results are indicative of active infection with SARS-CoV-2.  Clinical  correlation with patient history and other diagnostic information is  necessary to determine patient infection status.  Positive results do  not rule out bacterial infection or co-infection with other viruses. If result is PRESUMPTIVE POSTIVE SARS-CoV-2 nucleic acids MAY BE PRESENT.   A presumptive positive result was obtained on the submitted specimen  and confirmed on repeat testing.  While 2019 novel coronavirus  (SARS-CoV-2) nucleic acids may be present in the submitted sample  additional confirmatory testing may be necessary for epidemiological  and / or clinical management purposes  to differentiate between  SARS-CoV-2 and other Sarbecovirus currently known to infect humans.  If clinically indicated additional testing with an alternate test  methodology (LAB745 3) is advised. The SARS-CoV-2 RNA is generally  detectable  in upper and lower respiratory specimens during the acute  phase of infection. The expected result is Negative. Fact Sheet for Patients:  StrictlyIdeas.no Fact Sheet for Healthcare Providers: BankingDealers.co.za This test is not yet approved or cleared by the [place] FDA and has been authorized for detection and/or diagnosis of SARS-CoV-2 by FDA under an Emergency Use Authorization (EUA).  This EUA will remain in effect (meaning this test can be used) for the duration of the COVID-19 declaration under Section 564(b)(1) of the Act, 21 U.S.C. section 360bbb-3(b)(1), unless the authorization is terminated or revoked sooner. Performed at [facility], [place] [address] [place], [place] [address]     Radiology Reports Mr Cervical Spine Wo Contrast  Result Date: 03/01/2019 CLINICAL DATA:  Chronic right-sided neck pain with occasional right arm numbness. EXAM: MRI CERVICAL SPINE WITHOUT CONTRAST TECHNIQUE: Multiplanar, multisequence MR imaging of the cervical spine was performed. No intravenous contrast was administered. COMPARISON:  MRI cervical spine dated November 20, 2018. FINDINGS: Alignment: Unchanged straightening of the normal cervical lordosis. Vertebrae: No fracture, evidence of discitis, or bone lesion. Cord: Normal signal and morphology. Posterior Fossa, vertebral arteries, paraspinal tissues: Negative. Disc levels: C2-C3:  Negative. C3-C4: Unchanged mild disc bulging bilateral uncovertebral hypertrophy. Unchanged mild spinal canal stenosis. No neuroforaminal stenosis. C4-C5: Unchanged mild disc bulging with bilateral uncovertebral hypertrophy. Unchanged moderate spinal canal stenosis and severe bilateral neuroforaminal stenosis. C5-C6: Unchanged mild disc bulging and bilateral uncovertebral hypertrophy. Unchanged mild spinal canal stenosis and moderate to severe bilateral neuroforaminal stenosis. C6-C7: Unchanged mild disc bulging and  minimal uncovertebral hypertrophy. Unchanged borderline mild spinal canal stenosis. Unchanged mild bilateral neuroforaminal stenosis. C7-T1: Unchanged small left paracentral disc protrusion with slight mass effect on the left ventral cord. No stenosis. IMPRESSION: 1. Multilevel cervical spondylosis as described above, similar to prior study. 2. 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 3. Unchanged moderate to severe bilateral neuroforaminal stenosis at C5-C6. Electronically Signed   By: Titus Dubin M.D.   On: 03/01/2019 15:20   Dg Chest Port 1 View  Result Date: 03/02/2019 CLINICAL DATA:  Fever.  Hypertension.  Diabetic.  Nonsmoker.  Cough. EXAM: PORTABLE CHEST 1 VIEW COMPARISON:  08/29/2016 FINDINGS: Midline trachea. Normal heart size. No pleural effusion or pneumothorax. Especially when compared to 08/29/2016, bibasilar patchy opacities versus confluent interstitial thickening identified. The upper lungs are clear. IMPRESSION: Bibasilar interstitial thickening versus airspace disease. Cannot exclude multifocal pneumonia. Consider PA and lateral radiographs. Electronically Signed   By: Abigail Miyamoto M.D.   On: 03/02/2019 18:00

## 2019-03-05 LAB — COMPREHENSIVE METABOLIC PANEL
ALT: 22 U/L (ref 0–44)
AST: 23 U/L (ref 15–41)
Albumin: 3.1 g/dL — ABNORMAL LOW (ref 3.5–5.0)
Alkaline Phosphatase: 89 U/L (ref 38–126)
Anion gap: 11 (ref 5–15)
BUN: 22 mg/dL — ABNORMAL HIGH (ref 6–20)
CO2: 22 mmol/L (ref 22–32)
Calcium: 8.8 mg/dL — ABNORMAL LOW (ref 8.9–10.3)
Chloride: 103 mmol/L (ref 98–111)
Creatinine, Ser: 0.99 mg/dL (ref 0.44–1.00)
GFR calc Af Amer: >60 mL/min (ref >60)
GFR calc non Af Amer: >60 mL/min (ref >60)
Glucose, Bld: 226 mg/dL — ABNORMAL HIGH (ref 70–99)
Potassium: 3.6 mmol/L (ref 3.5–5.1)
Sodium: 136 mmol/L (ref 135–145)
Total Bilirubin: 0.5 mg/dL (ref 0.3–1.2)
Total Protein: 7.4 g/dL (ref 6.5–8.1)

## 2019-03-05 LAB — GLUCOSE, CAPILLARY
Glucose-Capillary: 256 mg/dL — ABNORMAL HIGH (ref 70–99)
Glucose-Capillary: 203 mg/dL — ABNORMAL HIGH (ref 70–99)

## 2019-03-05 LAB — D-DIMER, QUANTITATIVE: D-Dimer, Quant: 0.62 ug/mL-FEU — ABNORMAL HIGH (ref 0.00–0.50)

## 2019-03-05 LAB — LACTATE DEHYDROGENASE: LDH: 255 U/L — ABNORMAL HIGH (ref 98–192)

## 2019-03-05 LAB — C-REACTIVE PROTEIN: CRP: 0.9 mg/dL (ref <1.0)

## 2019-03-05 LAB — FERRITIN: Ferritin: 168 ng/mL (ref 11–307)

## 2019-03-05 NOTE — Discharge Instructions (Signed)
Person Under Monitoring Name: Teresa Grant  Location: [address] [place] [address]   Infection Prevention Recommendations for Individuals Confirmed to have, or Being Evaluated for, 2019 Novel Coronavirus (COVID-19) Infection Who Receive Care at Home  Individuals who are confirmed to have, or are being evaluated for, COVID-19 should follow the prevention steps below until a healthcare provider or local or state health department says they can return to normal activities.  Stay home except to get medical care You should restrict activities outside your home, except for getting medical care. Do not go to work, school, or public areas, and do not use public transportation or taxis.  Call ahead before visiting your doctor Before your medical appointment, call the healthcare provider and tell them that you have, or are being evaluated for, COVID-19 infection. This will help the healthcare providers office take steps to keep other people from getting infected. Ask your healthcare provider to call the local or state health department.  Monitor your symptoms Seek prompt medical attention if your illness is worsening (e.g., difficulty breathing). Before going to your medical appointment, call the healthcare provider and tell them that you have, or are being evaluated for, COVID-19 infection. Ask your healthcare provider to call the local or state health department.  Wear a facemask You should wear a facemask that covers your nose and mouth when you are in the same room with other people and when you visit a healthcare provider. People who live with or visit you should also wear a facemask while they are in the same room with you.  Separate yourself from other people in your home As much as possible, you should stay in a different room from other people in your home. Also, you should use a separate bathroom, if available.  Avoid sharing household items You should not  share dishes, drinking glasses, cups, eating utensils, towels, bedding, or other items with other people in your home. After using these items, you should wash them thoroughly with soap and water.  Cover your coughs and sneezes Cover your mouth and nose with a tissue when you cough or sneeze, or you can cough or sneeze into your sleeve. Throw used tissues in a lined trash can, and immediately wash your hands with soap and water for at least 20 seconds or use an alcohol-based hand rub.  Wash your [facility] your hands often and thoroughly with soap and water for at least 20 seconds. You can use an alcohol-based hand sanitizer if soap and water are not available and if your hands are not visibly dirty. Avoid touching your eyes, nose, and mouth with unwashed hands.   Prevention Steps for Caregivers and Household Members of Individuals Confirmed to have, or Being Evaluated for, COVID-19 Infection Being Cared for in the Home  If you live with, or provide care at home for, a person confirmed to have, or being evaluated for, COVID-19 infection please follow these guidelines to prevent infection:  Follow healthcare providers instructions Make sure that you understand and can help the patient follow any healthcare provider instructions for all care.  Provide for the patients basic needs You should help the patient with basic needs in the home and provide support for getting groceries, prescriptions, and other personal needs.  Monitor the patients symptoms If they are getting sicker, call his or her medical provider and tell them that the patient has, or is being evaluated for, COVID-19 infection. This will help the healthcare providers  office take steps to keep other people from getting infected. Ask the healthcare provider to call the local or state health department.  Limit the number of people who have contact with the patient  If possible, have only one caregiver for the  patient.  Other household members should stay in another home or place of residence. If this is not possible, they should stay  in another room, or be separated from the patient as much as possible. Use a separate bathroom, if available.  Restrict visitors who do not have an essential need to be in the home.  Keep older adults, very young children, and other sick people away from the patient Keep older adults, very young children, and those who have compromised immune systems or chronic health conditions away from the patient. This includes people with chronic heart, lung, or kidney conditions, diabetes, and cancer.  Ensure good ventilation Make sure that shared spaces in the home have good air flow, such as from an air conditioner or an opened window, weather permitting.  Wash your hands often  Wash your hands often and thoroughly with soap and water for at least 20 seconds. You can use an alcohol based hand sanitizer if soap and water are not available and if your hands are not visibly dirty.  Avoid touching your eyes, nose, and mouth with unwashed hands.  Use disposable paper towels to dry your hands. If not available, use dedicated cloth towels and replace them when they become wet.  Wear a facemask and gloves  Wear a disposable facemask at all times in the room and gloves when you touch or have contact with the patients blood, body fluids, and/or secretions or excretions, such as sweat, saliva, sputum, nasal mucus, vomit, urine, or feces.  Ensure the mask fits over your nose and mouth tightly, and do not touch it during use.  Throw out disposable facemasks and gloves after using them. Do not reuse.  Wash your hands immediately after removing your facemask and gloves.  If your personal clothing becomes contaminated, carefully remove clothing and launder. Wash your hands after handling contaminated clothing.  Place all used disposable facemasks, gloves, and other waste in a lined  container before disposing them with other household waste.  Remove gloves and wash your hands immediately after handling these items.  Do not share dishes, glasses, or other household items with the patient  Avoid sharing household items. You should not share dishes, drinking glasses, cups, eating utensils, towels, bedding, or other items with a patient who is confirmed to have, or being evaluated for, COVID-19 infection.  After the person uses these items, you should wash them thoroughly with soap and water.  Wash laundry thoroughly  Immediately remove and wash clothes or bedding that have blood, body fluids, and/or secretions or excretions, such as sweat, saliva, sputum, nasal mucus, vomit, urine, or feces, on them.  Wear gloves when handling laundry from the patient.  Read and follow directions on labels of laundry or clothing items and detergent. In general, wash and dry with the warmest temperatures recommended on the label.  Clean all areas the individual has used often  Clean all touchable surfaces, such as counters, tabletops, doorknobs, bathroom fixtures, toilets, phones, keyboards, tablets, and bedside tables, every day. Also, clean any surfaces that may have blood, body fluids, and/or secretions or excretions on them.  Wear gloves when cleaning surfaces the patient has come in contact with.  Use a diluted bleach solution (e.g., dilute bleach with 1  part bleach and 10 parts water) or a household disinfectant with a label that says EPA-registered for coronaviruses. To make a bleach solution at home, add 1 tablespoon of bleach to 1 quart (4 cups) of water. For a larger supply, add  cup of bleach to 1 gallon (16 cups) of water.  Read labels of cleaning products and follow recommendations provided on product labels. Labels contain instructions for safe and effective use of the cleaning product including precautions you should take when applying the product, such as wearing gloves or  eye protection and making sure you have good ventilation during use of the product.  Remove gloves and wash hands immediately after cleaning.  Monitor yourself for signs and symptoms of illness Caregivers and household members are considered close contacts, should monitor their health, and will be asked to limit movement outside of the home to the extent possible. Follow the monitoring steps for close contacts listed on the symptom monitoring form.   ? If you have additional questions, contact your local health department or call the epidemiologist on call at [phone] (available 24/7). ? This guidance is subject to change. For the most up-to-date guidance from [facility], please refer to their website: [email or web address]      Person Under Monitoring Name: Teresa Grant  Location: [address] [place] [address]   CORONAVIRUS DISEASE 2019 (COVID-19) Guidance for Persons Under Investigation You are being tested for the virus that causes coronavirus disease 2019 (COVID-19). Public health actions are necessary to ensure protection of your health and the health of others, and to prevent further spread of infection. COVID-19 is caused by a virus that can cause symptoms, such as fever, cough, and shortness of breath. The primary transmission from person to person is by coughing or sneezing. On October 24, 2018, the [employer] [facility] announced a [employer] Emergency of International Concern and on October 25, 2018 the [place] Department of Health and Human Services declared a public health emergency. If the virus that causesCOVID-19 spreads in the community, it could have severe public health consequences.  As a person under investigation for COVID-19, the [place] advises you to adhere to the following guidance until your test results are reported to  you. If your test result is positive, you will receive additional information from your provider and your local health department at that time.   Remain at home until you are cleared by your health provider or public health authorities.   Keep a log of visitors to your home using the form provided. Any visitors to your home must be aware of your isolation status.  If you plan to move to a new address or leave the county, notify the local health department in your county.  Call a doctor or seek care if you have an urgent medical need. Before seeking medical care, call ahead and get instructions from the provider before arriving at the medical office, clinic or hospital. Notify them that you are being tested for the virus that causes COVID-19 so arrangements can be made, as necessary, to prevent transmission to others in the healthcare setting. Next, notify the local health department in your county.  If a medical emergency arises and you need to call 911, inform the first responders that you are being tested for the virus that causes COVID-19. Next, notify the local health department in your county.  Adhere to all guidance set forth by the [place] for [place] of patients that is based on guidance from the Center for Disease Control and Prevention with suspected or confirmed COVID-19. It is provided with this guidance for Persons Under Investigation.  Your health and the health of our community are our top priorities. Public Health officials remain available to provide assistance and counseling to you about COVID-19 and compliance with this guidance.  Provider: ____________________________________________________________ Date: ______/_____/_________  By signing below, you acknowledge that you have read and agree to comply with this Guidance for Persons Under Investigation. ______________________________________________________________ Date:  ______/_____/_________  WHO DO I CALL? You can find a list of local health departments here: [email or web address] Health Department: ____________________________________________________________________ Contact Name: ________________________________________________________________________ Telephone: ___________________________________________________________________________  Marice Potter, [place], Communicable Disease Branch COVID-19 Guidance for Persons Under Investigation November 30, 2018

## 2019-03-05 NOTE — Progress Notes (Signed)
Patient was discharged to home. AVS was reviewed, no new meds and all questions answered. Patient confirmed she has all of the belongings she had at admission. IV removed. Contract signed and placed in chart. Left floor via wheelchair and assisted to the door, husband provided transportation home.

## 2019-03-05 NOTE — Discharge Summary (Signed)
PATIENT DETAILS Name: Teresa Grant Age: 58 y.o. Sex: female Date of Birth: [birth year] MRN: [id number]. Admitting Physician: [name], MD OFB:PZWCHEN, Bailey Mech, MD  Admit Date: 03/02/2019 Discharge date: 03/05/2019  Recommendations for Outpatient Follow-up:  1. Follow up with PCP in 1-2 weeks 2. Please obtain BMP/CBC in one week   Admitted From:  Home  Disposition: [facility] [place]: No  Equipment/Devices: None  Discharge Condition: Stable  CODE STATUS: FULL CODE  Diet recommendation:  Heart Healthy / Carb Modified   Brief Summary: See H&P, Labs, Consult and Test reports for all details in brief, Patient is a 58 y.o. female with PMHx of DM-2, HTN, dyslipidemia presented with fever, cough, exertional dyspnea-found to have COVID-19 viral pneumonia  Brief Hospital Course: COVID-19 of viral pneumonia:  Some subtle evidence of pneumonia on chest x-ray-did have fever on initial presentation but none since admission.  Was never hypoxic-O2 saturation was in the high to mid 90s on room air.  Given overall stability-was just managed with supportive care-did not qualify for Remdesivir (as no evidence of hypoxia).  Since patient remains on room air-afebrile since admission-inflammatory markers are downtrending-stable for discharge.  Patient instructed to seek immediate medical attention if in the unlikely event she develops shortness of breath, persistent fever nausea vomiting.   Hypertension: Controlled-continue with amlodipine and Avapro.  DM-2: CBGs are slightly uncontrolled as patient was briefly on steroids-on discharge she will resume her usual diabetic regimen.    Dyslipidemia: Continue statin  Procedures/Studies: None  Discharge Diagnoses:  Principal Problem:   Pneumonia due to COVID-19 virus Active Problems:   Essential hypertension   Diabetes mellitus type 2 in obese [facility])   Discharge Instructions:  Activity:  As tolerated   Discharge  Instructions    Call MD for:  difficulty breathing, headache or visual disturbances   Complete by:  As directed    Call MD for:  extreme fatigue   Complete by:  As directed    Call MD for:  persistant nausea and vomiting   Complete by:  As directed    Call MD for:  temperature >100.4   Complete by:  As directed    Diet - low sodium heart healthy   Complete by:  As directed    Diet Carb Modified   Complete by:  As directed    Discharge instructions   Complete by:  As directed    Follow with Primary MD  Glendale Chard, MD in 1 week  Please get a complete blood count and chemistry panel checked by your Primary MD at your next visit, and again as instructed by your Primary MD.  Get Medicines reviewed and adjusted: Please take all your medications with you for your next visit with your Primary MD  Laboratory/radiological data: Please request your Primary MD to go over all hospital tests and procedure/radiological results at the follow up, please ask your Primary MD to get all Hospital records sent to his/her office.  In some cases, they will be blood work, cultures and biopsy results pending at the time of your discharge. Please request that your primary care M.D. follows up on these results.  Also Note the following: If you experience worsening of your admission symptoms, develop shortness of breath, life threatening emergency, suicidal or homicidal thoughts you must seek medical attention immediately by calling 911 or calling your MD immediately  if symptoms less severe.  You must read complete instructions/literature along with all the possible adverse reactions/side effects  for all the Medicines you take and that have been prescribed to you. Take any new Medicines after you have completely understood and accpet all the possible adverse reactions/side effects.   Do not drive when taking Pain medications or sleeping medications (Benzodaizepines)  Do not take more than prescribed Pain,  Sleep and Anxiety Medications. It is not advisable to combine anxiety,sleep and pain medications without talking with your primary care practitioner  Special Instructions: If you have smoked or chewed Tobacco  in the last 2 yrs please stop smoking, stop any regular Alcohol  and or any Recreational drug use.  Wear Seat belts while driving.  Please note: You were cared for by a hospitalist during your hospital stay. Once you are discharged, your primary care physician will handle any further medical issues. Please note that NO REFILLS for any discharge medications will be authorized once you are discharged, as it is imperative that you return to your primary care physician (or establish a relationship with a primary care physician if you do not have one) for your post hospital discharge needs so that they can reassess your need for medications and monitor your lab values.  ?   Person Under Monitoring Name: Teresa Grant  Location: [address] [place] [address]   Infection Prevention Recommendations for Individuals Confirmed to have, or Being Evaluated for, 2019 Novel Coronavirus (COVID-19) Infection Who Receive Care at Home  Individuals who are confirmed to have, or are being evaluated for, COVID-19 should follow the prevention steps below until a healthcare provider or local or state health department says they can return to normal activities.  Stay home except to get medical care You should restrict activities outside your home, except for getting medical care. Do not go to work, school, or public areas, and do not use public transportation or taxis.  Call ahead before visiting your doctor Before your medical appointment, call the healthcare provider and tell them that you have, or are being evaluated for, COVID-19 infection. This will help the healthcare provider's office take steps to keep other people from getting infected. Ask your healthcare provider to call the  local or state health department.  Monitor your symptoms Seek prompt medical attention if your illness is worsening (e.g., difficulty breathing). Before going to your medical appointment, call the healthcare provider and tell them that you have, or are being evaluated for, COVID-19 infection. Ask your healthcare provider to call the local or state health department.  Wear a facemask You should wear a facemask that covers your nose and mouth when you are in the same room with other people and when you visit a healthcare provider. People who live with or visit you should also wear a facemask while they are in the same room with you.  Separate yourself from other people in your home As much as possible, you should stay in a different room from other people in your home. Also, you should use a separate bathroom, if available.  Avoid sharing household items You should not share dishes, drinking glasses, cups, eating utensils, towels, bedding, or other items with other people in your home. After using these items, you should wash them thoroughly with soap and water.  Cover your coughs and sneezes Cover your mouth and nose with a tissue when you cough or sneeze, or you can cough or sneeze into your sleeve. Throw used tissues in a lined trash can, and immediately wash your hands with soap and water for at least  20 seconds or use an alcohol-based hand rub.  Wash your [facility] your hands often and thoroughly with soap and water for at least 20 seconds. You can use an alcohol-based hand sanitizer if soap and water are not available and if your hands are not visibly dirty. Avoid touching your eyes, nose, and mouth with unwashed hands.   Prevention Steps for Caregivers and Household Members of Individuals Confirmed to have, or Being Evaluated for, COVID-19 Infection Being Cared for in the Home  If you live with, or provide care at home for, a person confirmed to have, or being evaluated for,  COVID-19 infection please follow these guidelines to prevent infection:  Follow healthcare provider's instructions Make sure that you understand and can help the patient follow any healthcare provider instructions for all care.  Provide for the patient's basic needs You should help the patient with basic needs in the home and provide support for getting groceries, prescriptions, and other personal needs.  Monitor the patient's symptoms If they are getting sicker, call his or her medical provider and tell them that the patient has, or is being evaluated for, COVID-19 infection. This will help the healthcare provider's office take steps to keep other people from getting infected. Ask the healthcare provider to call the local or state health department.  Limit the number of people who have contact with the patient If possible, have only one caregiver for the patient. Other household members should stay in another home or place of residence. If this is not possible, they should stay in another room, or be separated from the patient as much as possible. Use a separate bathroom, if available. Restrict visitors who do not have an essential need to be in the home.  Keep older adults, very young children, and other sick people away from the patient Keep older adults, very young children, and those who have compromised immune systems or chronic health conditions away from the patient. This includes people with chronic heart, lung, or kidney conditions, diabetes, and cancer.  Ensure good ventilation Make sure that shared spaces in the home have good air flow, such as from an air conditioner or an opened window, weather permitting.  Wash your hands often Wash your hands often and thoroughly with soap and water for at least 20 seconds. You can use an alcohol based hand sanitizer if soap and water are not available and if your hands are not visibly dirty. Avoid touching your eyes, nose, and mouth  with unwashed hands. Use disposable paper towels to dry your hands. If not available, use dedicated cloth towels and replace them when they become wet.  Wear a facemask and gloves Wear a disposable facemask at all times in the room and gloves when you touch or have contact with the patient's blood, body fluids, and/or secretions or excretions, such as sweat, saliva, sputum, nasal mucus, vomit, urine, or feces.  Ensure the mask fits over your nose and mouth tightly, and do not touch it during use. Throw out disposable facemasks and gloves after using them. Do not reuse. Wash your hands immediately after removing your facemask and gloves. If your personal clothing becomes contaminated, carefully remove clothing and launder. Wash your hands after handling contaminated clothing. Place all used disposable facemasks, gloves, and other waste in a lined container before disposing them with other household waste. Remove gloves and wash your hands immediately after handling these items.  Do not share dishes, glasses, or other household items with the patient Avoid  sharing household items. You should not share dishes, drinking glasses, cups, eating utensils, towels, bedding, or other items with a patient who is confirmed to have, or being evaluated for, COVID-19 infection. After the person uses these items, you should wash them thoroughly with soap and water.  Wash laundry thoroughly Immediately remove and wash clothes or bedding that have blood, body fluids, and/or secretions or excretions, such as sweat, saliva, sputum, nasal mucus, vomit, urine, or feces, on them. Wear gloves when handling laundry from the patient. Read and follow directions on labels of laundry or clothing items and detergent. In general, wash and dry with the warmest temperatures recommended on the label.  Clean all areas the individual has used often Clean all touchable surfaces, such as counters, tabletops, doorknobs, bathroom  fixtures, toilets, phones, keyboards, tablets, and bedside tables, every day. Also, clean any surfaces that may have blood, body fluids, and/or secretions or excretions on them. Wear gloves when cleaning surfaces the patient has come in contact with. Use a diluted bleach solution (e.g., dilute bleach with 1 part bleach and 10 parts water) or a household disinfectant with a label that says EPA-registered for coronaviruses. To make a bleach solution at home, add 1 tablespoon of bleach to 1 quart (4 cups) of water. For a larger supply, add  cup of bleach to 1 gallon (16 cups) of water. Read labels of cleaning products and follow recommendations provided on product labels. Labels contain instructions for safe and effective use of the cleaning product including precautions you should take when applying the product, such as wearing gloves or eye protection and making sure you have good ventilation during use of the product. Remove gloves and wash hands immediately after cleaning.  Monitor yourself for signs and symptoms of illness Caregivers and household members are considered close contacts, should monitor their health, and will be asked to limit movement outside of the home to the extent possible. Follow the monitoring steps for close contacts listed on the symptom monitoring form.   ? If you have additional questions, contact your local health department or call the epidemiologist on call at [phone] (available 24/7). ? This guidance is subject to change. For the most up-to-date guidance from [facility], please refer to their website: [email or web address].pl   Increase activity slowly   Complete by:  As directed      Allergies as of 03/05/2019      Reactions   Lisinopril Cough   Hydrochlorothiazide Other (See Comments)   pancreatitis      Medication List    STOP taking these medications   cyclobenzaprine 10 MG tablet Commonly known as:   FLEXERIL   ibuprofen 400 MG tablet Commonly known as:  ADVIL     TAKE these medications   amLODipine 10 MG tablet Commonly known as:  NORVASC Take 1 tablet (10 mg total) by mouth at bedtime.   fexofenadine 60 MG tablet Commonly known as:  ALLEGRA Take 60 mg by mouth at bedtime.   gabapentin 100 MG capsule Commonly known as:  NEURONTIN TAKE ONE CAPSULE BY MOUTH THREE TIMES A DAY What changed:    how much to take  how to take this  when to take this  additional instructions   glucose blood test strip Commonly known as:  OneTouch Verio Use as directed to check blood sugars 2 times per day dx: e11.65   Januvia 100 MG tablet Generic drug:  sitaGLIPtin TAKE ONE TABLET BY MOUTH DAILY What changed:    how much  to take  when to take this   metFORMIN 1000 MG tablet Commonly known as:  GLUCOPHAGE TAKE 1 TABLET BY MOUTH TWO TIMES A DAY WITH MORNING AND EVENING MEALS What changed:  See the new instructions.   Ozempic (0.25 or 0.5 MG/DOSE) 2 MG/1.5ML Sopn Generic drug:  Semaglutide(0.25 or 0.5MG /DOS) DIAL AND INJECT SUBCUTANEOUSLY 0.5 MG WEEKLY What changed:  See the new instructions.   pravastatin 40 MG tablet Commonly known as:  PRAVACHOL TAKE ONE TABLET BY MOUTH AT BEDTIME   telmisartan 20 MG tablet Commonly known as:  MICARDIS TAKE 1 TABLET BY MOUTH DAILY What changed:  when to take this   [place] FlexTouch 200 UNIT/ML Sopn Generic drug:  Insulin Degludec INJECT 51 UNITS UNDER SKIN AT BEDTIME (INCREASE AS DIRECTED) NOT TO EXCEED 100 UNITS What changed:  See the new instructions.      Follow-up Information    Glendale Chard, MD. Schedule an appointment as soon as possible for a visit in 1 week(s).   Specialty:  Internal Medicine Contact information: [address] [place] [address] [phone]          Allergies  Allergen Reactions   Lisinopril Cough   Hydrochlorothiazide Other (See Comments)    pancreatitis     Consultations:   None    Other Procedures/Studies: Mr Cervical Spine Wo Contrast  Result Date: 03/01/2019 CLINICAL DATA:  Chronic right-sided neck pain with occasional right arm numbness. EXAM: MRI CERVICAL SPINE WITHOUT CONTRAST TECHNIQUE: Multiplanar, multisequence MR imaging of the cervical spine was performed. No intravenous contrast was administered. COMPARISON:  MRI cervical spine dated November 20, 2018. FINDINGS: Alignment: Unchanged straightening of the normal cervical lordosis. Vertebrae: No fracture, evidence of discitis, or bone lesion. Cord: Normal signal and morphology. Posterior Fossa, vertebral arteries, paraspinal tissues: Negative. Disc levels: C2-C3:  Negative. C3-C4: Unchanged mild disc bulging bilateral uncovertebral hypertrophy. Unchanged mild spinal canal stenosis. No neuroforaminal stenosis. C4-C5: Unchanged mild disc bulging with bilateral uncovertebral hypertrophy. Unchanged moderate spinal canal stenosis and severe bilateral neuroforaminal stenosis. C5-C6: Unchanged mild disc bulging and bilateral uncovertebral hypertrophy. Unchanged mild spinal canal stenosis and moderate to severe bilateral neuroforaminal stenosis. C6-C7: Unchanged mild disc bulging and minimal uncovertebral hypertrophy. Unchanged borderline mild spinal canal stenosis. Unchanged mild bilateral neuroforaminal stenosis. C7-T1: Unchanged small left paracentral disc protrusion with slight mass effect on the left ventral cord. No stenosis. IMPRESSION: 1. Multilevel cervical spondylosis as described above, similar to prior study. 2. 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 3. Unchanged moderate to severe bilateral neuroforaminal stenosis at C5-C6. Electronically Signed   By: Titus Dubin M.D.   On: 03/01/2019 15:20   Dg Chest Port 1 View  Result Date: 03/02/2019 CLINICAL DATA:  Fever.  Hypertension.  Diabetic.  Nonsmoker.  Cough. EXAM: PORTABLE CHEST 1 VIEW  COMPARISON:  08/29/2016 FINDINGS: Midline trachea. Normal heart size. No pleural effusion or pneumothorax. Especially when compared to 08/29/2016, bibasilar patchy opacities versus confluent interstitial thickening identified. The upper lungs are clear. IMPRESSION: Bibasilar interstitial thickening versus airspace disease. Cannot exclude multifocal pneumonia. Consider PA and lateral radiographs. Electronically Signed   By: Abigail Miyamoto M.D.   On: 03/02/2019 18:00     TODAY-DAY OF DISCHARGE:  Subjective:   Teresa Grant today has no headache,no chest abdominal pain,no new weakness tingling or numbness, feels much better wants to go home today.   Objective:   Blood pressure 139/80, pulse 82, temperature 98.8 F (37.1 C), temperature source Oral, resp.  rate (!) 23, height 5\' 1"  (1.549 m), weight 77.9 kg, SpO2 100 %.  Intake/Output Summary (Last 24 hours) at 03/05/2019 0957 Last data filed at 03/05/2019 0800 Gross per 24 hour  Intake 720 ml  Output --  Net 720 ml   Filed Weights   03/02/19 1715 03/03/19 0319 03/04/19 0500  Weight: 78.9 kg 77.2 kg 77.9 kg    Exam: Awake Alert, Oriented *3, No new F.N deficits, Normal affect [place].AT,PERRAL Supple Neck,No JVD, No cervical lymphadenopathy appriciated.  Symmetrical Chest wall movement, Good air movement bilaterally, CTAB RRR,No Gallops,Rubs or new Murmurs, No Parasternal Heave +ve B.Sounds, Abd Soft, Non tender, No organomegaly appriciated, No rebound -guarding or rigidity. No Cyanosis, Clubbing or edema, No new Rash or bruise   PERTINENT RADIOLOGIC STUDIES: Mr Cervical Spine Wo Contrast  Result Date: 03/01/2019 CLINICAL DATA:  Chronic right-sided neck pain with occasional right arm numbness. EXAM: MRI CERVICAL SPINE WITHOUT CONTRAST TECHNIQUE: Multiplanar, multisequence MR imaging of the cervical spine was performed. No intravenous contrast was administered. COMPARISON:  MRI cervical spine dated November 20, 2018. FINDINGS: Alignment:  Unchanged straightening of the normal cervical lordosis. Vertebrae: No fracture, evidence of discitis, or bone lesion. Cord: Normal signal and morphology. Posterior Fossa, vertebral arteries, paraspinal tissues: Negative. Disc levels: C2-C3:  Negative. C3-C4: Unchanged mild disc bulging bilateral uncovertebral hypertrophy. Unchanged mild spinal canal stenosis. No neuroforaminal stenosis. C4-C5: Unchanged mild disc bulging with bilateral uncovertebral hypertrophy. Unchanged moderate spinal canal stenosis and severe bilateral neuroforaminal stenosis. C5-C6: Unchanged mild disc bulging and bilateral uncovertebral hypertrophy. Unchanged mild spinal canal stenosis and moderate to severe bilateral neuroforaminal stenosis. C6-C7: Unchanged mild disc bulging and minimal uncovertebral hypertrophy. Unchanged borderline mild spinal canal stenosis. Unchanged mild bilateral neuroforaminal stenosis. C7-T1: Unchanged small left paracentral disc protrusion with slight mass effect on the left ventral cord. No stenosis. IMPRESSION: 1. Multilevel cervical spondylosis as described above, similar to prior study. 2. 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 3. Unchanged moderate to severe bilateral neuroforaminal stenosis at C5-C6. Electronically Signed   By: Titus Dubin M.D.   On: 03/01/2019 15:20   Dg Chest Port 1 View  Result Date: 03/02/2019 CLINICAL DATA:  Fever.  Hypertension.  Diabetic.  Nonsmoker.  Cough. EXAM: PORTABLE CHEST 1 VIEW COMPARISON:  08/29/2016 FINDINGS: Midline trachea. Normal heart size. No pleural effusion or pneumothorax. Especially when compared to 08/29/2016, bibasilar patchy opacities versus confluent interstitial thickening identified. The upper lungs are clear. IMPRESSION: Bibasilar interstitial thickening versus airspace disease. Cannot exclude multifocal pneumonia. Consider PA and lateral radiographs. Electronically Signed   By: Abigail Miyamoto M.D.   On:  03/02/2019 18:00     PERTINENT LAB RESULTS: CBC: Recent Labs    03/02/19 1720  WBC 6.3  HGB 11.4*  HCT 36.5  PLT 227   CMET CMP     Component Value Date/Time   NA 136 03/05/2019 0257   NA 139 01/16/2019 1008   K 3.6 03/05/2019 0257   CL 103 03/05/2019 0257   CO2 22 03/05/2019 0257   GLUCOSE 226 (H) 03/05/2019 0257   BUN 22 (H) 03/05/2019 0257   BUN 12 01/16/2019 1008   CREATININE 0.99 03/05/2019 0257   CREATININE 0.80 04/16/2014 1048   CALCIUM 8.8 (L) 03/05/2019 0257   PROT 7.4 03/05/2019 0257   PROT 7.1 10/16/2018 1113   ALBUMIN 3.1 (L) 03/05/2019 0257   ALBUMIN 4.2 10/16/2018 1113   AST 23 03/05/2019 0257   ALT 22 03/05/2019 0257  ALKPHOS 89 03/05/2019 0257   BILITOT 0.5 03/05/2019 0257   BILITOT 0.2 10/16/2018 1113   GFRNONAA >60 03/05/2019 0257   GFRAA >60 03/05/2019 0257    GFR Estimated Creatinine Clearance: 59.2 mL/min (by C-G formula based on SCr of 0.99 mg/dL). No results for input(s): LIPASE, AMYLASE in the last 72 hours. No results for input(s): CKTOTAL, CKMB, CKMBINDEX, TROPONINI in the last 72 hours. Invalid input(s): POCBNP Recent Labs    03/04/19 0228 03/05/19 0257  DDIMER 0.84* 0.62*   No results for input(s): HGBA1C in the last 72 hours. No results for input(s): CHOL, HDL, LDLCALC, TRIG, CHOLHDL, LDLDIRECT in the last 72 hours. No results for input(s): TSH, T4TOTAL, T3FREE, THYROIDAB in the last 72 hours.  Invalid input(s): FREET3 Recent Labs    03/04/19 0228 03/05/19 0257  FERRITIN 156 168   Coags: No results for input(s): INR in the last 72 hours.  Invalid input(s): PT Microbiology: Recent Results (from the past 240 hour(s))  Culture, blood (routine x 2)     Status: None (Preliminary result)   Collection Time: 03/02/19  5:10 PM  Result Value Ref Range Status   Specimen Description BLOOD LEFT HAND  Final   Special Requests AEROBIC BOTTLE ONLY Blood Culture adequate volume  Final   Culture   Final    NO GROWTH 2  DAYS Performed at [facility], [address], [place], [place] [address]    Report Status PENDING  Incomplete  Culture, blood (routine x 2)     Status: None (Preliminary result)   Collection Time: 03/02/19  5:20 PM  Result Value Ref Range Status   Specimen Description BLOOD LEFT HAND  Final   Special Requests   Final    AEROBIC BOTTLE ONLY Blood Culture results may not be optimal due to an inadequate volume of blood received in culture bottles   Culture   Final    NO GROWTH 2 DAYS Performed at [facility], [place] [address], [place], [place] [address]    Report Status PENDING  Incomplete  SARS Coronavirus 2 (CEPHEID- Performed in [place] hospital lab), Hosp Order     Status: Abnormal   Collection Time: 03/02/19  5:46 PM  Result Value Ref Range Status   SARS Coronavirus 2 POSITIVE (A) NEGATIVE Final    Comment: RESULT CALLED TO, READ BACK BY AND VERIFIED WITH: S CRUZ,RN AT 1942 03/02/2019 BY L BENFIELD (NOTE) If result is NEGATIVE SARS-CoV-2 target nucleic acids are NOT DETECTED. The SARS-CoV-2 RNA is generally detectable in upper and lower  respiratory specimens during the acute phase of infection. The lowest  concentration of SARS-CoV-2 viral copies this assay can detect is 250  copies / mL. A negative result does not preclude SARS-CoV-2 infection  and should not be used as the sole basis for treatment or other  patient management decisions.  A negative result may occur with  improper specimen collection / handling, submission of specimen other  than nasopharyngeal swab, presence of viral mutation(s) within the  areas targeted by this assay, and inadequate number of viral copies  (<250 copies / mL). A negative result must be combined with clinical  observations, patient history, and epidemiological information. If result is POSITIVE SARS-CoV-2 target nucleic acids are DETECTE D. The SARS-CoV-2 RNA is generally detectable in upper and lower  respiratory specimens  during the acute phase of infection.  Positive  results are indicative of active infection with SARS-CoV-2.  Clinical  correlation with patient  history and other diagnostic information is  necessary to determine patient infection status.  Positive results do  not rule out bacterial infection or co-infection with other viruses. If result is PRESUMPTIVE POSTIVE SARS-CoV-2 nucleic acids MAY BE PRESENT.   A presumptive positive result was obtained on the submitted specimen  and confirmed on repeat testing.  While 2019 novel coronavirus  (SARS-CoV-2) nucleic acids may be present in the submitted sample  additional confirmatory testing may be necessary for epidemiological  and / or clinical management purposes  to differentiate between  SARS-CoV-2 and other Sarbecovirus currently known to infect humans.  If clinically indicated additional testing with an alternate test  methodology (LAB745 3) is advised. The SARS-CoV-2 RNA is generally  detectable in upper and lower respiratory specimens during the acute  phase of infection. The expected result is Negative. Fact Sheet for Patients:  StrictlyIdeas.no Fact Sheet for Healthcare Providers: BankingDealers.co.za This test is not yet approved or cleared by the [place] FDA and has been authorized for detection and/or diagnosis of SARS-CoV-2 by FDA under an Emergency Use Authorization (EUA).  This EUA will remain in effect (meaning this test can be used) for the duration of the COVID-19 declaration under Section 564(b)(1) of the Act, 21 U.S.C. section 360bbb-3(b)(1), unless the authorization is terminated or revoked sooner. Performed at [facility], [place] [address], [place], [place] [address]     FURTHER DISCHARGE INSTRUCTIONS:  Get Medicines reviewed and adjusted: Please take all your medications with you for your next visit with your Primary MD  Laboratory/radiological data: Please  request your Primary MD to go over all hospital tests and procedure/radiological results at the follow up, please ask your Primary MD to get all Hospital records sent to his/her office.  In some cases, they will be blood work, cultures and biopsy results pending at the time of your discharge. Please request that your primary care M.D. goes through all the records of your hospital data and follows up on these results.  Also Note the following: If you experience worsening of your admission symptoms, develop shortness of breath, life threatening emergency, suicidal or homicidal thoughts you must seek medical attention immediately by calling 911 or calling your MD immediately  if symptoms less severe.  You must read complete instructions/literature along with all the possible adverse reactions/side effects for all the Medicines you take and that have been prescribed to you. Take any new Medicines after you have completely understood and accpet all the possible adverse reactions/side effects.   Do not drive when taking Pain medications or sleeping medications (Benzodaizepines)  Do not take more than prescribed Pain, Sleep and Anxiety Medications. It is not advisable to combine anxiety,sleep and pain medications without talking with your primary care practitioner  Special Instructions: If you have smoked or chewed Tobacco  in the last 2 yrs please stop smoking, stop any regular Alcohol  and or any Recreational drug use.  Wear Seat belts while driving.  Please note: You were cared for by a hospitalist during your hospital stay. Once you are discharged, your primary care physician will handle any further medical issues. Please note that NO REFILLS for any discharge medications will be authorized once you are discharged, as it is imperative that you return to your primary care physician (or establish a relationship with a primary care physician if you do not have one) for your post hospital discharge needs  so that they can reassess your need for medications and monitor your lab  values.  Total Time spent coordinating discharge including counseling, education and face to face time equals 25 minutes.  SignedOren Binet 03/05/2019 9:57 AM

## 2019-03-06 ENCOUNTER — Telehealth: Payer: Self-pay

## 2019-03-06 NOTE — Telephone Encounter (Signed)
Transition Care Management Follow-up Telephone Call  Date of discharge and from where: 03/05/2019 [facility]  How have you been since you were released from the hospital? Pt said she is fine and has no fever but some nausea.  Any questions or concerns? No  Items Reviewed:  Did the pt receive and understand the discharge instructions provided? Yes  Medications obtained and verified? Yes  Any new allergies since your discharge? No  Dietary orders reviewed?  n/a  Do you have support at home? Yes  Other (ie: DME, Home Health, etc)   Functional Questionnaire: (I = Independent and D = Dependent) ADL's: I  Bathing/Dressing- I   Meal Prep- D  Eating- I  Maintaining continence- I  Transferring/Ambulation- I  Managing Meds- I   Follow up appointments reviewed:    PCP Hospital f/u appt confirmed?Yes  Scheduled to see Dr. Baird Cancer on 06/16  @ 4 pm  [facility] f/u appt confirmed?  n/a  Are transportation arrangements needed? No  If their condition worsens, is the pt aware to call  their PCP or go to the ED?Yes  Was the patient provided with contact information for the PCP's office or ED? Yes  Was the pt encouraged to call back with questions or concerns? Yes

## 2019-03-07 LAB — CULTURE, BLOOD (ROUTINE X 2)
Culture: NO GROWTH
Culture: NO GROWTH
Special Requests: ADEQUATE

## 2019-03-11 ENCOUNTER — Other Ambulatory Visit: Payer: Self-pay

## 2019-03-11 ENCOUNTER — Ambulatory Visit (INDEPENDENT_AMBULATORY_CARE_PROVIDER_SITE_OTHER): Payer: BC Managed Care – PPO | Admitting: Internal Medicine

## 2019-03-11 ENCOUNTER — Encounter: Payer: Self-pay | Admitting: Internal Medicine

## 2019-03-11 ENCOUNTER — Telehealth: Payer: Self-pay | Admitting: *Deleted

## 2019-03-11 ENCOUNTER — Telehealth: Payer: Self-pay

## 2019-03-11 VITALS — BP 142/70 | Temp 97.7°F | Ht 61.0 in | Wt 159.0 lb

## 2019-03-11 DIAGNOSIS — Z20822 Contact with and (suspected) exposure to covid-19: Secondary | ICD-10-CM

## 2019-03-11 DIAGNOSIS — J1289 Other viral pneumonia: Secondary | ICD-10-CM

## 2019-03-11 DIAGNOSIS — E1165 Type 2 diabetes mellitus with hyperglycemia: Secondary | ICD-10-CM

## 2019-03-11 DIAGNOSIS — J1282 Pneumonia due to coronavirus disease 2019: Secondary | ICD-10-CM

## 2019-03-11 DIAGNOSIS — Z09 Encounter for follow-up examination after completed treatment for conditions other than malignant neoplasm: Secondary | ICD-10-CM | POA: Diagnosis not present

## 2019-03-11 DIAGNOSIS — U071 COVID-19: Secondary | ICD-10-CM | POA: Diagnosis not present

## 2019-03-11 DIAGNOSIS — I10 Essential (primary) hypertension: Secondary | ICD-10-CM

## 2019-03-11 NOTE — Telephone Encounter (Addendum)
Patient referred for COVID-19 testing by Dr. Glendale Chard. Patient called and scheduled for testing on 03/14/19 at [facility] site. Pt advised to wear a mask and remain in car at the time of appt. Pt verbalized understanding.

## 2019-03-11 NOTE — Telephone Encounter (Signed)
Hello,  Dr. Baird Cancer would like the patient to be contacted and scheduled for coronavirus testing.  The patient would need to be rechecked on Friday 03/14/2019 if possible.  Thank you.

## 2019-03-11 NOTE — Telephone Encounter (Signed)
Patient scheduled for testing on 03/14/19 at [facility] site.

## 2019-03-14 ENCOUNTER — Other Ambulatory Visit: Payer: BC Managed Care – PPO

## 2019-03-14 DIAGNOSIS — Z20822 Contact with and (suspected) exposure to covid-19: Secondary | ICD-10-CM

## 2019-03-18 LAB — NOVEL CORONAVIRUS, NAA: SARS-CoV-2, NAA: NOT DETECTED

## 2019-03-24 ENCOUNTER — Other Ambulatory Visit: Payer: Self-pay | Admitting: Internal Medicine

## 2019-04-08 ENCOUNTER — Other Ambulatory Visit: Payer: Self-pay | Admitting: Internal Medicine

## 2019-04-13 ENCOUNTER — Encounter: Payer: Self-pay | Admitting: Internal Medicine

## 2019-04-13 DIAGNOSIS — E1165 Type 2 diabetes mellitus with hyperglycemia: Secondary | ICD-10-CM | POA: Insufficient documentation

## 2019-04-13 NOTE — Progress Notes (Signed)
Virtual Visit via Video   This visit type was conducted due to national recommendations for restrictions regarding the COVID-19 Pandemic (e.g. social distancing) in an effort to limit this patient's exposure and mitigate transmission in our community.  Due to her co-morbid illnesses, this patient is at least at moderate risk for complications without adequate follow up.  This format is felt to be most appropriate for this patient at this time.  All issues noted in this document were discussed and addressed.  A limited physical exam was performed with this format.    This visit type was conducted due to national recommendations for restrictions regarding the COVID-19 Pandemic (e.g. social distancing) in an effort to limit this patient's exposure and mitigate transmission in our community.  Patients identity confirmed using two different identifiers.  This format is felt to be most appropriate for this patient at this time.  All issues noted in this document were discussed and addressed.  No physical exam was performed (except for noted visual exam findings with Video Visits).    Date:  04/13/2019   ID:  Teresa Grant, DOB Feb 22, 1961, MRN [id number]  Patient Location:  Home  Provider location:   Office    Chief Complaint:  Hospital f/u  History of Present Illness:    Teresa Grant is a 58 y.o. female who presents via video conferencing for a telehealth visit today.    The patient does have symptoms concerning for COVID-19 infection (fever, chills, cough, or new shortness of breath).   She presents today for virtual visit. She prefers this method of contact due to COVID-19 pandemic.  She presents today for hospital f/u. She was admitted to [facility] (transferred to [facility]) on June 7th.  She is a 58 y.o.femalewith PMHx of DM-2, HTN, dyslipidemia presented with fever, cough, exertional dyspnea. She denies known contacts.  She was found to have COVID-19 viral pneumonia. She  admits her hospital course was pretty uneventful.  She was never hypoxic-O2 saturation was in the high to mid 90s on room air.  Given overall stability-was just managed with supportive care-did not qualify for Remdesivir (as no evidence of hypoxia).  Since patient remained on room air and was afebrile since admission AND inflammatory markers were downtrending, she was considered stable for discharge on June 10th.   She has not had any SOB or fever since her discharge. She has no new concerns at this time.     Past Medical History:  Diagnosis Date   Diabetes mellitus    Dysrhythmia    1985   Heart murmur    HLD (hyperlipidemia)    Hypertension    Kidney stone on right side 03/2013   Pneumonia    7 yrs ago   Past Surgical History:  Procedure Laterality Date   ABDOMINAL HYSTERECTOMY  2001   BACK SURGERY     CESAREAN SECTION  1990   COLONOSCOPY     Dr. Deatra Ina normal   LUMBAR LAMINECTOMY/DECOMPRESSION MICRODISCECTOMY  03/20/2012   Procedure: LUMBAR LAMINECTOMY/DECOMPRESSION MICRODISCECTOMY;  Surgeon: Sinclair Ship, MD;  Location: [facility];  Service: Orthopedics;  Laterality: Left;  Left sided lumbar 4-5 microdisectomy   SPINE SURGERY  2013     Current Meds  Medication Sig   Ascorbic Acid (VITAMIN C PO) Take by mouth.   fexofenadine (ALLEGRA) 60 MG tablet Take 60 mg by mouth at bedtime.   gabapentin (NEURONTIN) 100 MG capsule TAKE ONE CAPSULE BY MOUTH THREE TIMES A DAY (Patient  taking differently: Take 100 mg by mouth 3 (three) times daily. )   glucose blood (ONETOUCH VERIO) test strip Use as directed to check blood sugars 2 times per day dx: e11.65   JANUVIA 100 MG tablet TAKE ONE TABLET BY MOUTH DAILY (Patient taking differently: Take 100 mg by mouth at bedtime. )   metFORMIN (GLUCOPHAGE) 1000 MG tablet TAKE 1 TABLET BY MOUTH TWO TIMES A DAY WITH MORNING AND EVENING MEALS (Patient taking differently: Take 1,000 mg by mouth 2 (two) times daily with a meal. Morning  and evening)   Multiple Vitamins-Minerals (ZINC PO) Take by mouth.   pravastatin (PRAVACHOL) 40 MG tablet TAKE ONE TABLET BY MOUTH AT BEDTIME (Patient taking differently: Take 40 mg by mouth at bedtime. )   telmisartan (MICARDIS) 20 MG tablet TAKE 1 TABLET BY MOUTH DAILY (Patient taking differently: Take 20 mg by mouth at bedtime. )   [DISCONTINUED] amLODipine (NORVASC) 10 MG tablet Take 1 tablet (10 mg total) by mouth at bedtime.   [DISCONTINUED] OZEMPIC, 0.25 OR 0.5 MG/DOSE, 2 MG/1.5ML SOPN DIAL AND INJECT SUBCUTANEOUSLY 0.5 MG WEEKLY (Patient taking differently: Inject 0.5 mg into the skin once a week. )   [DISCONTINUED] TRESIBA FLEXTOUCH 200 UNIT/ML SOPN INJECT 51 UNITS UNDER SKIN AT BEDTIME (INCREASE AS DIRECTED) NOT TO EXCEED 100 UNITS (Patient taking differently: Inject 44 Units into the skin at bedtime. Do not exceed 100 units)     Allergies:   Lisinopril and Hydrochlorothiazide   Social History   Tobacco Use   Smoking status: Former Smoker    Packs/day: 0.50    Years: 25.00    Pack years: 12.50    Quit date: 04/29/2004    Years since quitting: 14.9   Smokeless tobacco: Never Used  Substance Use Topics   Alcohol use: No    Frequency: Never   Drug use: No     Family Hx: The patient's family history includes Colon cancer in her cousin; Esophageal cancer in her maternal uncle; Heart disease in her mother; Kidney disease in her father. There is no history of Stroke, Cancer, Colon polyps, Rectal cancer, or Stomach cancer.  ROS:   Please see the history of present illness.    Review of Systems  Constitutional: Negative.   Respiratory: Negative.   Cardiovascular: Negative.   Gastrointestinal: Negative.   Neurological: Negative.   Psychiatric/Behavioral: Negative.     All other systems reviewed and are negative.   Labs/Other Tests and Data Reviewed:    Recent Labs: 03/02/2019: Hemoglobin 11.4; Platelets 227 03/05/2019: ALT 22; BUN 22; Creatinine, Ser 0.99;  Potassium 3.6; Sodium 136   Recent Lipid Panel Lab Results  Component Value Date/Time   CHOL 153 10/16/2018 11:13 AM   TRIG 82 10/16/2018 11:13 AM   HDL 38 (L) 10/16/2018 11:13 AM   CHOLHDL 4.0 10/16/2018 11:13 AM   CHOLHDL 2.8 09/13/2012 11:56 AM   LDLCALC 99 10/16/2018 11:13 AM    Wt Readings from Last 3 Encounters:  03/11/19 159 lb (72.1 kg)  03/04/19 171 lb 11.2 oz (77.9 kg)  01/15/19 165 lb (74.8 kg)     Exam:    Vital Signs:  BP (!) 142/70 (BP Location: Left Arm, Patient Position: Sitting, Cuff Size: Normal) Comment: pt provided   Temp 97.7 F (36.5 C) (Oral)    Ht 5\' 1"  (1.549 m)    Wt 159 lb (72.1 kg)    BMI 30.04 kg/m     Physical Exam  Constitutional: She is oriented to person,  place, and time and well-developed, well-nourished, and in no distress.  HENT:  Head: Normocephalic and atraumatic.  Pulmonary/Chest: Effort normal.  Neurological: She is alert and oriented to person, place, and time.  Psychiatric: Affect normal.  Nursing note and vitals reviewed. She is able to speak in full sentences.   ASSESSMENT & PLAN:     1. Pneumonia due to COVID-19 virus  TCM PERFORMED. A MEMBER OF THE CLINICAL TEAM SPOKE WITH THE PATIENT UPON DISCHARGE. DISCHARGE SUMMARY WAS REVIEWED IN FULL DETAIL DURING THE VISIT. MEDS RECONCILED AND COMPARED TO DISCHARGE MEDS. MEDICATION LIST WAS UPDATED AND REVIEWED WITH THE PATIENT. GREATER THAN 50% FACE TO FACE TIME WAS SPENT IN COUNSELING AND COORDINATION OF CARE. SHE WAS ENCOURAGED TO NOTIFY ME SHOULD ANY OF HER SYMPTOMS RECUR.  SHE IS ENCOURAGED TO CONTINUE TO TAKE HER TEMPS FOR ANOTHER 72 HOURS. I WILL ALSO REFER HER FOR REPEAT TESTING. SHE IS IN AGREEMENT WITH HER TREATMENT PLAN.  ALL QUESTIONS WERE ANSWERED TO THE SATISFACTION OF THE PATIENT.    2. Uncontrolled type 2 diabetes mellitus with hyperglycemia (HCC)  CHRONIC. BS LIKELY ELEVATED DUE TO STEROID USE. SHE IS ENCOURAGED TO CONTINUE TO MONITOR HER BS AND NOTIFY ME IF ABOVE  150.  3. Essential hypertension  CHRONIC. FAIR CONTROL. PT ADVISED BP ELEVATION COULD BE DUE TO RECENT STEROID USE. SHE HAS DM CHECK SCHEDULED 7/29 - I WILL RE-EVALUATE AT THAT TIME.   4. Hypocalcemia  I WILL RECHECK CALCIUM LEVELS IN ONE WEEK. SHE AGREES TO COME IN NEXT WEEK FOR LABWORK, THIS WILL BE PERFORMED OUTSIDE IN HER CAR. SHE IS IN AGREEMENT WITH HER TREATMENT PLAN.     COVID-19 Education: The signs and symptoms of COVID-19 were discussed with the patient and how to seek care for testing (follow up with PCP or arrange E-visit).  The importance of social distancing was discussed today.  Patient Risk:   After full review of this patients clinical status, I feel that they are at least moderate risk at this time.  Time:   Today, I have spent 25 minute with the patient with telehealth technology discussing above diagnoses.     Medication Adjustments/Labs and Tests Ordered: Current medicines are reviewed at length with the patient today.  Concerns regarding medicines are outlined above.   Tests Ordered: No orders of the defined types were placed in this encounter.   Medication Changes: No orders of the defined types were placed in this encounter.   Disposition:  Follow up in 1 month(s)  Signed, [name], MD

## 2019-04-14 ENCOUNTER — Other Ambulatory Visit: Payer: Self-pay

## 2019-04-14 ENCOUNTER — Telehealth: Payer: Self-pay

## 2019-04-14 DIAGNOSIS — U071 COVID-19: Secondary | ICD-10-CM

## 2019-04-14 DIAGNOSIS — E1169 Type 2 diabetes mellitus with other specified complication: Secondary | ICD-10-CM

## 2019-04-14 DIAGNOSIS — E669 Obesity, unspecified: Secondary | ICD-10-CM

## 2019-04-14 NOTE — Telephone Encounter (Signed)
Called to make outside lab visit left message

## 2019-04-16 ENCOUNTER — Other Ambulatory Visit: Payer: Self-pay

## 2019-04-16 ENCOUNTER — Other Ambulatory Visit: Payer: BC Managed Care – PPO

## 2019-04-16 DIAGNOSIS — E1169 Type 2 diabetes mellitus with other specified complication: Secondary | ICD-10-CM

## 2019-04-16 DIAGNOSIS — J1282 Pneumonia due to coronavirus disease 2019: Secondary | ICD-10-CM

## 2019-04-16 DIAGNOSIS — E669 Obesity, unspecified: Secondary | ICD-10-CM

## 2019-04-16 DIAGNOSIS — U071 COVID-19: Secondary | ICD-10-CM

## 2019-04-16 LAB — CBC WITH DIFFERENTIAL/PLATELET
Basophils Absolute: 0 10*3/uL (ref 0.0–0.2)
Basos: 1 %
EOS (ABSOLUTE): 0.2 10*3/uL (ref 0.0–0.4)
Eos: 3 %
Hematocrit: 36.6 % (ref 34.0–46.6)
Hemoglobin: 11.7 g/dL (ref 11.1–15.9)
Immature Grans (Abs): 0 10*3/uL (ref 0.0–0.1)
Immature Granulocytes: 0 %
Lymphocytes Absolute: 2.5 10*3/uL (ref 0.7–3.1)
Lymphs: 45 %
MCH: 25.8 pg — ABNORMAL LOW (ref 26.6–33.0)
MCHC: 32 g/dL (ref 31.5–35.7)
MCV: 81 fL (ref 79–97)
Monocytes Absolute: 0.5 10*3/uL (ref 0.1–0.9)
Monocytes: 9 %
Neutrophils Absolute: 2.3 10*3/uL (ref 1.4–7.0)
Neutrophils: 42 %
Platelets: 272 10*3/uL (ref 150–450)
RBC: 4.54 x10E6/uL (ref 3.77–5.28)
RDW: 14.4 % (ref 11.7–15.4)
WBC: 5.5 10*3/uL (ref 3.4–10.8)

## 2019-04-16 LAB — HEMOGLOBIN A1C
Est. average glucose Bld gHb Est-mCnc: 169 mg/dL
Hgb A1c MFr Bld: 7.5 % — ABNORMAL HIGH (ref 4.8–5.6)

## 2019-04-23 ENCOUNTER — Other Ambulatory Visit: Payer: Self-pay

## 2019-04-23 ENCOUNTER — Ambulatory Visit: Payer: BC Managed Care – PPO | Admitting: Internal Medicine

## 2019-04-23 ENCOUNTER — Encounter: Payer: Self-pay | Admitting: Internal Medicine

## 2019-04-23 VITALS — BP 136/84 | HR 90 | Temp 98.7°F | Ht 61.0 in | Wt 175.0 lb

## 2019-04-23 DIAGNOSIS — R253 Fasciculation: Secondary | ICD-10-CM | POA: Diagnosis not present

## 2019-04-23 DIAGNOSIS — I10 Essential (primary) hypertension: Secondary | ICD-10-CM

## 2019-04-23 DIAGNOSIS — Z712 Person consulting for explanation of examination or test findings: Secondary | ICD-10-CM

## 2019-04-23 DIAGNOSIS — E6609 Other obesity due to excess calories: Secondary | ICD-10-CM | POA: Diagnosis not present

## 2019-04-23 DIAGNOSIS — Z6833 Body mass index (BMI) 33.0-33.9, adult: Secondary | ICD-10-CM

## 2019-04-23 DIAGNOSIS — E1165 Type 2 diabetes mellitus with hyperglycemia: Secondary | ICD-10-CM | POA: Diagnosis not present

## 2019-04-23 NOTE — Patient Instructions (Signed)

## 2019-04-24 LAB — BMP8+EGFR
BUN/Creatinine Ratio: 13 (ref 9–23)
BUN: 13 mg/dL (ref 6–24)
CO2: 20 mmol/L (ref 20–29)
Calcium: 9.6 mg/dL (ref 8.7–10.2)
Chloride: 103 mmol/L (ref 96–106)
Creatinine, Ser: 1.04 mg/dL — ABNORMAL HIGH (ref 0.57–1.00)
GFR calc Af Amer: 69 mL/min/{1.73_m2} (ref >59)
GFR calc non Af Amer: 60 mL/min/{1.73_m2} (ref >59)
Glucose: 195 mg/dL — ABNORMAL HIGH (ref 65–99)
Potassium: 3.8 mmol/L (ref 3.5–5.2)
Sodium: 141 mmol/L (ref 134–144)

## 2019-05-05 ENCOUNTER — Encounter: Payer: Self-pay | Admitting: Internal Medicine

## 2019-05-07 NOTE — Progress Notes (Signed)
Subjective:     Patient ID: Teresa Grant , female    DOB: March 04, 1961 , 58 y.o.   MRN: [id number]   Chief Complaint  Patient presents with  . Diabetes  . Hypertension    HPI  Diabetes She presents for her follow-up diabetic visit. She has type 2 diabetes mellitus. Her disease course has been stable. There are no hypoglycemic associated symptoms. Pertinent negatives for diabetes include no blurred vision and no chest pain. There are no hypoglycemic complications. Risk factors for coronary artery disease include diabetes mellitus, dyslipidemia, hypertension, obesity, post-menopausal, sedentary lifestyle and stress. She is following a diabetic diet. She participates in exercise intermittently. Her breakfast blood glucose is taken between 8-9 am. Her breakfast blood glucose range is generally 130-140 mg/dl. An ACE inhibitor/angiotensin II receptor blocker is being taken.  Hypertension This is a chronic problem. The current episode started more than 1 year ago. The problem has been gradually improving since onset. The problem is controlled. Associated symptoms include neck pain. Pertinent negatives include no blurred vision or chest pain. The current treatment provides moderate improvement. Compliance problems include exercise.      Past Medical History:  Diagnosis Date  . Diabetes mellitus   . Dysrhythmia    1985  . Heart murmur   . HLD (hyperlipidemia)   . Hypertension   . Kidney stone on right side 03/2013  . Pneumonia    7 yrs ago     Family History  Problem Relation Age of Onset  . Heart disease Mother   . Kidney disease Father   . Esophageal cancer Maternal Uncle   . Colon cancer Cousin   . Stroke Neg Hx   . Cancer Neg Hx   . Colon polyps Neg Hx   . Rectal cancer Neg Hx   . Stomach cancer Neg Hx      Current Outpatient Medications:  .  amLODipine (NORVASC) 10 MG tablet, TAKE ONE TABLET BY MOUTH DAILY, Disp: 90 tablet, Rfl: 1 .  Ascorbic Acid (VITAMIN C PO), Take by  mouth., Disp: , Rfl:  .  fexofenadine (ALLEGRA) 60 MG tablet, Take 60 mg by mouth at bedtime., Disp: , Rfl:  .  gabapentin (NEURONTIN) 100 MG capsule, TAKE ONE CAPSULE BY MOUTH THREE TIMES A DAY (Patient taking differently: Take 100 mg by mouth 3 (three) times daily. ), Disp: 90 capsule, Rfl: 1 .  glucose blood (ONETOUCH VERIO) test strip, Use as directed to check blood sugars 2 times per day dx: e11.65, Disp: 100 each, Rfl: 10 .  Insulin Degludec (TRESIBA FLEXTOUCH) 200 UNIT/ML SOPN, Inject 44 Units into the skin at bedtime. Do not exceed 100 units, Disp: 9 pen, Rfl: 2 .  JANUVIA 100 MG tablet, TAKE ONE TABLET BY MOUTH DAILY (Patient taking differently: Take 100 mg by mouth at bedtime. ), Disp: 90 tablet, Rfl: 1 .  metFORMIN (GLUCOPHAGE) 1000 MG tablet, TAKE 1 TABLET BY MOUTH TWO TIMES A DAY WITH MORNING AND EVENING MEALS (Patient taking differently: Take 1,000 mg by mouth 2 (two) times daily with a meal. Morning and evening), Disp: 180 tablet, Rfl: 1 .  pravastatin (PRAVACHOL) 40 MG tablet, TAKE ONE TABLET BY MOUTH AT BEDTIME (Patient taking differently: Take 40 mg by mouth at bedtime. ), Disp: 90 tablet, Rfl: 1 .  Semaglutide,0.25 or 0.5MG/DOS, (OZEMPIC, 0.25 OR 0.5 MG/DOSE,) 2 MG/1.5ML SOPN, Inject 0.5 mg into the skin once a week., Disp: 3 pen, Rfl: 3 .  telmisartan (MICARDIS) 20 MG tablet, TAKE  1 TABLET BY MOUTH DAILY (Patient taking differently: Take 20 mg by mouth at bedtime. ), Disp: 90 tablet, Rfl: 0 .  Zinc Sulfate (ZINCATE PO), Take by mouth., Disp: , Rfl:    Allergies  Allergen Reactions  . Lisinopril Cough  . Hydrochlorothiazide Other (See Comments)    pancreatitis     Review of Systems  Constitutional: Negative.   Eyes: Negative for blurred vision.  Respiratory: Negative.   Cardiovascular: Negative.  Negative for chest pain.  Gastrointestinal: Negative.   Musculoskeletal: Positive for neck pain.  Neurological: Negative.   Psychiatric/Behavioral: Negative.      Today's  Vitals   04/23/19 1503  BP: 136/84  Pulse: 90  Temp: 98.7 F (37.1 C)  TempSrc: Oral  Weight: 175 lb (79.4 kg)  Height: _0  (1.549 m)   Body mass index is 33.07 kg/m.   Objective:  Physical Exam Vitals signs and nursing note reviewed.  Constitutional:      Appearance: Normal appearance.  HENT:     Head: Normocephalic and atraumatic.  Cardiovascular:     Rate and Rhythm: Normal rate and regular rhythm.     Heart sounds: Normal heart sounds.  Pulmonary:     Effort: Pulmonary effort is normal.     Breath sounds: Normal breath sounds.  Skin:    General: Skin is warm.  Neurological:     General: No focal deficit present.     Mental Status: She is alert.  Psychiatric:        Mood and Affect: Mood normal.        Behavior: Behavior normal.         Assessment And Plan:     1. Uncontrolled type 2 diabetes mellitus with hyperglycemia (Teresa Grant)  She does not need a1c drawn today; most recent lab results reviewed in full detail. I will check renal function/potassium level today.   - BMP8+EGFR  2. Essential hypertension  Fair control. Pt is aware that optimal bp is less than 120/80. She is encouraged to avoid canned foods and packaged foods which tend to have high sodium levels. She will continue with current meds for now.   3. Fasciculations  She had some ocular fasciculations during her visit. She is encouraged to stay well hydrated and increase her intake of magnesium rich foods.   4. Class 1 obesity due to excess calories with serious comorbidity and body mass index (BMI) of 33.0 to 33.9 in adult  Importance of achieving optimal weight to decrease risk of cardiovascular disease and cancers was discussed with the patient in full detail. She is encouraged to start slowly - start with 10 minutes twice daily at least three to four days per week and to gradually build to 30 minutes five days weekly. She was given tips to incorporate more activity into her daily routine - take  stairs when possible, park farther away from her job, grocery stores, etc.    Teresa Greenland, MD    THE PATIENT IS ENCOURAGED TO PRACTICE SOCIAL DISTANCING DUE TO THE COVID-19 PANDEMIC.

## 2019-05-12 ENCOUNTER — Encounter: Payer: Self-pay | Admitting: Internal Medicine

## 2019-05-21 ENCOUNTER — Other Ambulatory Visit: Payer: Self-pay | Admitting: Internal Medicine

## 2019-05-23 ENCOUNTER — Encounter: Payer: Self-pay | Admitting: Internal Medicine

## 2019-05-27 ENCOUNTER — Telehealth: Payer: Self-pay

## 2019-05-27 NOTE — Telephone Encounter (Signed)
The pt called and left a message that she doesn't need the office to fill out [employer] forms for her anymore because she she is scheduled for surgery on her neck on sept 11th with Dr. Christella Noa and his office is going to fill out the form.

## 2019-06-06 HISTORY — PX: CERVICAL SPINE SURGERY: SHX589

## 2019-06-21 ENCOUNTER — Other Ambulatory Visit: Payer: Self-pay | Admitting: Internal Medicine

## 2019-07-26 ENCOUNTER — Other Ambulatory Visit: Payer: Self-pay | Admitting: Internal Medicine

## 2019-08-02 ENCOUNTER — Other Ambulatory Visit: Payer: Self-pay | Admitting: Internal Medicine

## 2019-08-05 ENCOUNTER — Encounter: Payer: Self-pay | Admitting: Internal Medicine

## 2019-08-05 ENCOUNTER — Ambulatory Visit (INDEPENDENT_AMBULATORY_CARE_PROVIDER_SITE_OTHER): Payer: BC Managed Care – PPO | Admitting: Internal Medicine

## 2019-08-05 ENCOUNTER — Other Ambulatory Visit: Payer: Self-pay

## 2019-08-05 VITALS — BP 136/84 | HR 95 | Temp 98.1°F | Ht 61.0 in | Wt 167.4 lb

## 2019-08-05 DIAGNOSIS — M62838 Other muscle spasm: Secondary | ICD-10-CM

## 2019-08-05 DIAGNOSIS — I10 Essential (primary) hypertension: Secondary | ICD-10-CM

## 2019-08-05 DIAGNOSIS — E1165 Type 2 diabetes mellitus with hyperglycemia: Secondary | ICD-10-CM | POA: Diagnosis not present

## 2019-08-05 DIAGNOSIS — Z23 Encounter for immunization: Secondary | ICD-10-CM

## 2019-08-05 DIAGNOSIS — Z780 Asymptomatic menopausal state: Secondary | ICD-10-CM | POA: Diagnosis not present

## 2019-08-05 NOTE — Progress Notes (Signed)
Subjective:     Patient ID: Teresa Grant , female    DOB: Jun 24, 1961 , 58 y.o.   MRN: [id number]   Chief Complaint  Patient presents with  . Diabetes  . Hypertension    HPI  Diabetes She presents for her follow-up diabetic visit. She has type 2 diabetes mellitus. Her disease course has been stable. There are no hypoglycemic associated symptoms. Pertinent negatives for diabetes include no blurred vision and no chest pain. There are no hypoglycemic complications. Risk factors for coronary artery disease include diabetes mellitus, dyslipidemia, hypertension, obesity, post-menopausal, sedentary lifestyle and stress. She is following a diabetic diet. She participates in exercise intermittently. Her breakfast blood glucose is taken between 8-9 am. Her breakfast blood glucose range is generally 130-140 mg/dl. An ACE inhibitor/angiotensin II receptor blocker is being taken. She does not see a podiatrist.Eye exam is not current.  Hypertension This is a chronic problem. The current episode started more than 1 year ago. The problem has been gradually improving since onset. The problem is controlled. Associated symptoms include neck pain. Pertinent negatives include no blurred vision or chest pain. Risk factors for coronary artery disease include diabetes mellitus, dyslipidemia, post-menopausal state and sedentary lifestyle. Past treatments include angiotensin blockers and calcium channel blockers. The current treatment provides moderate improvement. Compliance problems include exercise.      Past Medical History:  Diagnosis Date  . Diabetes mellitus   . Dysrhythmia    1985  . Heart murmur   . HLD (hyperlipidemia)   . Hypertension   . Kidney stone on right side 03/2013  . Pneumonia    7 yrs ago     Family History  Problem Relation Age of Onset  . Heart disease Mother   . Kidney disease Father   . Esophageal cancer Maternal Uncle   . Colon cancer Cousin   . Stroke Neg Hx   . Cancer Neg  Hx   . Colon polyps Neg Hx   . Rectal cancer Neg Hx   . Stomach cancer Neg Hx      Current Outpatient Medications:  .  amLODipine (NORVASC) 10 MG tablet, TAKE ONE TABLET BY MOUTH DAILY, Disp: 90 tablet, Rfl: 1 .  Ascorbic Acid (VITAMIN C PO), Take by mouth., Disp: , Rfl:  .  gabapentin (NEURONTIN) 100 MG capsule, TAKE ONE CAPSULE BY MOUTH THREE TIMES A DAY, Disp: 90 capsule, Rfl: 0 .  glucose blood (ONETOUCH VERIO) test strip, Use as directed to check blood sugars 2 times per day dx: e11.65, Disp: 100 each, Rfl: 10 .  Insulin Degludec (TRESIBA FLEXTOUCH) 200 UNIT/ML SOPN, Inject 44 Units into the skin at bedtime. Do not exceed 100 units, Disp: 9 pen, Rfl: 2 .  JANUVIA 100 MG tablet, TAKE ONE TABLET BY MOUTH DAILY, Disp: 90 tablet, Rfl: 0 .  metFORMIN (GLUCOPHAGE) 1000 MG tablet, TAKE ONE TABLET BY MOUTH TWICE A DAY WITH MORNING AND EVENING MEALS, Disp: 180 tablet, Rfl: 0 .  pravastatin (PRAVACHOL) 40 MG tablet, TAKE ONE TABLET BY MOUTH AT BEDTIME (Patient taking differently: Take 40 mg by mouth at bedtime. ), Disp: 90 tablet, Rfl: 1 .  Semaglutide,0.25 or 0.5MG/DOS, (OZEMPIC, 0.25 OR 0.5 MG/DOSE,) 2 MG/1.5ML SOPN, Inject 0.5 mg into the skin once a week., Disp: 3 pen, Rfl: 3 .  telmisartan (MICARDIS) 20 MG tablet, TAKE ONE TABLET BY MOUTH DAILY, Disp: 90 tablet, Rfl: 0 .  Zinc Sulfate (ZINCATE PO), Take by mouth., Disp: , Rfl:    Allergies  Allergen Reactions  . Lisinopril Cough  . Hydrochlorothiazide Other (See Comments)    pancreatitis     Review of Systems  Constitutional: Negative.   Eyes: Negative for blurred vision.  Respiratory: Negative.   Cardiovascular: Negative.  Negative for chest pain.  Gastrointestinal: Negative.   Genitourinary:       She would like referral to GYN. She has no specific concerns or complaints. Prefers to have GYN exam performed by GYN.   Musculoskeletal: Positive for neck pain.       She c/o muscle spasms in neck/shoulders. Sx usually occur at night.  She does not take magnesium or any other supplements.   Neurological: Negative.   Psychiatric/Behavioral: Negative.      Today's Vitals   08/05/19 1035  BP: 136/84  Pulse: 95  Temp: 98.1 F (36.7 C)  TempSrc: Oral  Weight: 167 lb 6.4 oz (75.9 kg)  Height: '5\' 1"'  (1.549 m)   Body mass index is 31.63 kg/m.   Objective:  Physical Exam Vitals signs and nursing note reviewed.  Constitutional:      Appearance: Normal appearance.  HENT:     Head: Normocephalic and atraumatic.  Neck:     Musculoskeletal: Muscular tenderness present.  Cardiovascular:     Rate and Rhythm: Normal rate and regular rhythm.     Heart sounds: Normal heart sounds.  Pulmonary:     Effort: Pulmonary effort is normal.     Breath sounds: Normal breath sounds.  Skin:    General: Skin is warm.  Neurological:     General: No focal deficit present.     Mental Status: She is alert.  Psychiatric:        Mood and Affect: Mood normal.        Behavior: Behavior normal.         Assessment And Plan:     1. Uncontrolled type 2 diabetes mellitus with hyperglycemia ([place])  I will check labs as listed below. Importance of regular exercise was discussed with the patient. I will also refer her for diabetic eye exam.   - CMP14+EGFR - Hemoglobin A1c - Lipid panel - Ambulatory referral to Optometry  2. Essential hypertension  Chronic, fair control. Pt is aware optimal bp is less than 130/80. She is encouraged to incorporate more exercise into her daily routine. She is also encouraged to follow a low-salt diet.   3. Muscle spasm  She is encouraged to stay well hydrated. Also encouraged to start magnesium nightly. Advised she can get OTC. She will let me know if her sx persist.   4. Need for vaccination  - Flu Vaccine QUAD 6+ mos PF IM (Fluarix Quad PF)  5. Postmenopausal  She would like referral to GYN. Referral placed as requested.   - Ambulatory referral to Gynecology       [name], MD     THE PATIENT IS ENCOURAGED TO PRACTICE SOCIAL DISTANCING DUE TO THE COVID-19 PANDEMIC.

## 2019-08-05 NOTE — Patient Instructions (Addendum)
Try Calm, a magnesium supplement to help with muscle cramps   Muscle Cramps and Spasms Muscle cramps and spasms are when muscles tighten by themselves. They usually get better within minutes. Muscle cramps are painful. They are usually stronger and last longer than muscle spasms. Muscle spasms may or may not be painful. They can last a few seconds or much longer. Cramps and spasms can affect any muscle, but they occur most often in the calf muscles of the leg. They are usually not caused by a serious problem. In many cases, the cause is not known. Some common causes include:  Doing more physical work or exercise than your body is ready for.  Using the muscles too much (overuse) by repeating certain movements too many times.  Staying in a certain position for a long time.  Playing a sport or doing an activity without preparing properly.  Using bad form or technique while playing a sport or doing an activity.  Not having enough water in your body (dehydration).  Injury.  Side effects of some medicines.  Low levels of the salts and minerals in your blood (electrolytes), such as low potassium or calcium. Follow these instructions at home: Managing pain and stiffness      Massage, stretch, and relax the muscle. Do this for many minutes at a time.  If told, put heat on tight or tense muscles as often as told by your doctor. Use the heat source that your doctor recommends, such as a moist heat pack or a heating pad. ? Place a towel between your skin and the heat source. ? Leave the heat on for 20-30 minutes. ? Remove the heat if your skin turns bright red. This is very important if you are not able to feel pain, heat, or cold. You may have a greater risk of getting burned.  If told, put ice on the affected area. This may help if you are sore or have pain after a cramp or spasm. ? Put ice in a plastic bag. ? Place a towel between your skin and the bag. ? Leave the ice on for 20  minutes, 2-3 times a day.  Try taking hot showers or baths to help relax tight muscles. Eating and drinking  Drink enough fluid to keep your pee (urine) pale yellow.  Eat a healthy diet to help ensure that your muscles work well. This should include: ? Fruits and vegetables. ? Lean protein. ? Whole grains. ? Low-fat or nonfat dairy products. General instructions  If you are having cramps often, avoid intense exercise for several days.  Take over-the-counter and prescription medicines only as told by your doctor.  Watch for any changes in your symptoms.  Keep all follow-up visits as told by your doctor. This is important. Contact a doctor if:  Your cramps or spasms get worse or happen more often.  Your cramps or spasms do not get better with time. Summary  Muscle cramps and spasms are when muscles tighten by themselves. They usually get better within minutes.  Cramps and spasms occur most often in the calf muscles of the leg.  Massage, stretch, and relax the muscle. This may help the cramp or spasm go away.  Drink enough fluid to keep your pee (urine) pale yellow. This information is not intended to replace advice given to you by your health care provider. Make sure you discuss any questions you have with your health care provider. Document Released: 08/24/2008 Document Revised: 02/04/2018 Document Reviewed: 02/04/2018 Elsevier  Patient Education  [employer].

## 2019-08-06 LAB — HEMOGLOBIN A1C
Est. average glucose Bld gHb Est-mCnc: 160 mg/dL
Hgb A1c MFr Bld: 7.2 % — ABNORMAL HIGH (ref 4.8–5.6)

## 2019-08-06 LAB — CMP14+EGFR
ALT: 12 IU/L (ref 0–32)
AST: 22 IU/L (ref 0–40)
Albumin/Globulin Ratio: 1.5 (ref 1.2–2.2)
Albumin: 4.3 g/dL (ref 3.8–4.9)
Alkaline Phosphatase: 139 IU/L — ABNORMAL HIGH (ref 39–117)
BUN/Creatinine Ratio: 11 (ref 9–23)
BUN: 11 mg/dL (ref 6–24)
Bilirubin Total: 0.3 mg/dL (ref 0.0–1.2)
CO2: 24 mmol/L (ref 20–29)
Calcium: 9.8 mg/dL (ref 8.7–10.2)
Chloride: 107 mmol/L — ABNORMAL HIGH (ref 96–106)
Creatinine, Ser: 0.97 mg/dL (ref 0.57–1.00)
GFR calc Af Amer: 74 mL/min/{1.73_m2} (ref >59)
GFR calc non Af Amer: 65 mL/min/{1.73_m2} (ref >59)
Globulin, Total: 2.9 g/dL (ref 1.5–4.5)
Glucose: 71 mg/dL (ref 65–99)
Potassium: 4.3 mmol/L (ref 3.5–5.2)
Sodium: 145 mmol/L — ABNORMAL HIGH (ref 134–144)
Total Protein: 7.2 g/dL (ref 6.0–8.5)

## 2019-08-06 LAB — LIPID PANEL
Chol/HDL Ratio: 3.4 ratio (ref 0.0–4.4)
Cholesterol, Total: 154 mg/dL (ref 100–199)
HDL: 45 mg/dL (ref >39)
LDL Chol Calc (NIH): 96 mg/dL (ref 0–99)
Triglycerides: 63 mg/dL (ref 0–149)
VLDL Cholesterol Cal: 13 mg/dL (ref 5–40)

## 2019-08-13 ENCOUNTER — Other Ambulatory Visit: Payer: Self-pay | Admitting: Internal Medicine

## 2019-08-17 ENCOUNTER — Other Ambulatory Visit: Payer: Self-pay | Admitting: Nurse Practitioner

## 2019-08-17 ENCOUNTER — Other Ambulatory Visit: Payer: Self-pay | Admitting: Internal Medicine

## 2019-08-24 LAB — HM DIABETES EYE EXAM

## 2019-08-28 ENCOUNTER — Encounter: Payer: Self-pay | Admitting: Internal Medicine

## 2019-08-29 ENCOUNTER — Other Ambulatory Visit: Payer: Self-pay | Admitting: Internal Medicine

## 2019-08-29 NOTE — Telephone Encounter (Signed)
Gabapentin refill

## 2019-09-17 ENCOUNTER — Other Ambulatory Visit: Payer: Self-pay | Admitting: Internal Medicine

## 2019-10-22 ENCOUNTER — Encounter: Payer: BC Managed Care – PPO | Admitting: Internal Medicine

## 2019-10-30 ENCOUNTER — Other Ambulatory Visit: Payer: Self-pay | Admitting: Internal Medicine

## 2019-11-08 ENCOUNTER — Other Ambulatory Visit: Payer: Self-pay | Admitting: Internal Medicine

## 2019-11-12 ENCOUNTER — Other Ambulatory Visit: Payer: Self-pay

## 2019-11-12 ENCOUNTER — Encounter: Payer: Self-pay | Admitting: Internal Medicine

## 2019-11-12 ENCOUNTER — Ambulatory Visit: Payer: BC Managed Care – PPO | Admitting: Internal Medicine

## 2019-11-12 VITALS — BP 128/76 | HR 89 | Temp 98.1°F | Ht 60.6 in | Wt 171.2 lb

## 2019-11-12 DIAGNOSIS — E1169 Type 2 diabetes mellitus with other specified complication: Secondary | ICD-10-CM | POA: Diagnosis not present

## 2019-11-12 DIAGNOSIS — L700 Acne vulgaris: Secondary | ICD-10-CM | POA: Diagnosis not present

## 2019-11-12 DIAGNOSIS — I10 Essential (primary) hypertension: Secondary | ICD-10-CM | POA: Diagnosis not present

## 2019-11-12 DIAGNOSIS — E66811 Obesity, class 1: Secondary | ICD-10-CM

## 2019-11-12 DIAGNOSIS — Z0000 Encounter for general adult medical examination without abnormal findings: Secondary | ICD-10-CM | POA: Diagnosis not present

## 2019-11-12 DIAGNOSIS — E6609 Other obesity due to excess calories: Secondary | ICD-10-CM | POA: Diagnosis not present

## 2019-11-12 DIAGNOSIS — Z6832 Body mass index (BMI) 32.0-32.9, adult: Secondary | ICD-10-CM | POA: Diagnosis not present

## 2019-11-12 LAB — POCT URINALYSIS DIPSTICK
Bilirubin, UA: NEGATIVE
Glucose, UA: NEGATIVE
Ketones, UA: NEGATIVE
Leukocytes, UA: NEGATIVE
Nitrite, UA: NEGATIVE
Protein, UA: POSITIVE — AB
Spec Grav, UA: 1.025 (ref 1.010–1.025)
Urobilinogen, UA: 0.2 E.U./dL
pH, UA: 5.5 (ref 5.0–8.0)

## 2019-11-12 LAB — POCT UA - MICROALBUMIN
Creatinine, POC: 200 mg/dL
Microalbumin Ur, POC: 150 mg/L

## 2019-11-12 NOTE — Progress Notes (Addendum)
This visit occurred during the SARS-CoV-2 public health emergency.  Safety protocols were in place, including screening questions prior to the visit, additional usage of staff PPE, and extensive cleaning of exam room while observing appropriate contact time as indicated for disinfecting solutions.  Subjective:     Patient ID: Teresa Grant , female    DOB: [birth year] , 59 y.o.   MRN: [id number]   Chief Complaint  Patient presents with  . Annual Exam  . Diabetes  . Hypertension    HPI  She is here today for a full physical exam. She is followed by GYN for her pelvic exams. She has upcoming appt in March 2021.   Diabetes She presents for her follow-up diabetic visit. She has type 2 diabetes mellitus. Her disease course has been stable. There are no hypoglycemic associated symptoms. Pertinent negatives for diabetes include no blurred vision, no chest pain and no polyphagia. There are no hypoglycemic complications. Risk factors for coronary artery disease include diabetes mellitus, dyslipidemia, hypertension, obesity, post-menopausal, sedentary lifestyle and stress. Her breakfast blood glucose is taken between 8-9 am. Her breakfast blood glucose range is generally 130-140 mg/dl. An ACE inhibitor/angiotensin II receptor blocker is being taken.  Hypertension This is a chronic problem. The current episode started more than 1 year ago. The problem has been gradually improving since onset. The problem is controlled. Pertinent negatives include no blurred vision or chest pain.     Past Medical History:  Diagnosis Date  . Diabetes mellitus   . Dysrhythmia    1985  . Heart murmur   . HLD (hyperlipidemia)   . Hypertension   . Kidney stone on right side 03/2013  . Pneumonia    7 yrs ago     Family History  Problem Relation Age of Onset  . Heart disease Mother   . Kidney disease Father   . Esophageal cancer Maternal Uncle   . Colon cancer Cousin   . Stroke Neg Hx   . Cancer Neg Hx    . Colon polyps Neg Hx   . Rectal cancer Neg Hx   . Stomach cancer Neg Hx      Current Outpatient Medications:  .  amLODipine (NORVASC) 10 MG tablet, TAKE ONE TABLET BY MOUTH DAILY, Disp: 90 tablet, Rfl: 0 .  Ascorbic Acid (VITAMIN C PO), Take by mouth., Disp: , Rfl:  .  gabapentin (NEURONTIN) 100 MG capsule, TAKE ONE CAPSULE BY MOUTH THREE TIMES A DAY (Patient taking differently: 2 (two) times daily. TAKE ONE CAPSULE BY MOUTH THREE TIMES A DAY), Disp: 90 capsule, Rfl: 2 .  glucose blood (ONETOUCH VERIO) test strip, Use as directed to check blood sugars 2 times per day dx: e11.65, Disp: 100 each, Rfl: 10 .  JANUVIA 100 MG tablet, TAKE ONE TABLET BY MOUTH DAILY, Disp: 90 tablet, Rfl: 0 .  metFORMIN (GLUCOPHAGE) 1000 MG tablet, TAKE ONE TABLET BY MOUTH TWICE A DAY WITH MORNING AND EVENING MEALS, Disp: 180 tablet, Rfl: 0 .  Semaglutide,0.25 or 0.5MG/DOS, (OZEMPIC, 0.25 OR 0.5 MG/DOSE,) 2 MG/1.5ML SOPN, Inject 0.5 mg into the skin once a week., Disp: 3 pen, Rfl: 3 .  TRESIBA FLEXTOUCH 200 UNIT/ML SOPN, INJECT 44 UNITS INTO THE SKIN EVERY NIGHT AT BEDTIME. DO NOT EXCEED 100 UNITS, Disp: 9 pen, Rfl: 0 .  Zinc Sulfate (ZINCATE PO), Take by mouth., Disp: , Rfl:  .  pravastatin (PRAVACHOL) 40 MG tablet, TAKE ONE TABLET BY MOUTH EVERY NIGHT AT BEDTIME, Disp: 90 tablet, Rfl:  1 .  telmisartan (MICARDIS) 20 MG tablet, TAKE ONE TABLET BY MOUTH DAILY, Disp: 90 tablet, Rfl: 1   Allergies  Allergen Reactions  . Lisinopril Cough  . Hydrochlorothiazide Other (See Comments)    pancreatitis     The patient states she uses status post hysterectomy for birth control. Last LMP was No LMP recorded. Patient has had a hysterectomy.. Negative for Dysmenorrhea Negative for: breast discharge, breast lump(s), breast pain and breast self exam. Associated symptoms include abnormal vaginal bleeding. Pertinent negatives include abnormal bleeding (hematology), anxiety, decreased libido, depression, difficulty falling sleep,  dyspareunia, history of infertility, nocturia, sexual dysfunction, sleep disturbances, urinary incontinence, urinary urgency, vaginal discharge and vaginal itching. Diet regular.The patient states her exercise level is  intermittent.  . The patient's tobacco use is:  Social History   Tobacco Use  Smoking Status Former Smoker  . Packs/day: 0.50  . Years: 25.00  . Pack years: 12.50  . Quit date: 04/29/2004  . Years since quitting: 15.5  Smokeless Tobacco Never Used  . She has been exposed to passive smoke. The patient's alcohol use is:  Social History   Substance and Sexual Activity  Alcohol Use No    Review of Systems  Constitutional: Negative.   HENT: Negative.   Eyes: Negative.  Negative for blurred vision.  Respiratory: Negative.   Cardiovascular: Negative.  Negative for chest pain.  Endocrine: Negative.  Negative for polyphagia.  Genitourinary: Negative.   Musculoskeletal: Negative.   Skin: Negative.   Allergic/Immunologic: Negative.   Neurological: Negative.   Hematological: Negative.   Psychiatric/Behavioral: Negative.      Today's Vitals   11/12/19 1117  BP: 128/76  Pulse: 89  Temp: 98.1 F (36.7 C)  TempSrc: Oral  Weight: 171 lb 3.2 oz (77.7 kg)  Height: 5' 0.6" (1.539 m)   Body mass index is 32.78 kg/m.   Objective:  Physical Exam Vitals reviewed.  Constitutional:      Appearance: Normal appearance. She is obese.  HENT:     Head: Normocephalic and atraumatic.     Right Ear: Tympanic membrane, ear canal and external ear normal.     Left Ear: Tympanic membrane, ear canal and external ear normal.     Nose:     Comments: Deferred, masked    Mouth/Throat:     Comments: Deferred, masked Eyes:     Extraocular Movements: Extraocular movements intact.     Conjunctiva/sclera: Conjunctivae normal.     Pupils: Pupils are equal, round, and reactive to light.  Cardiovascular:     Rate and Rhythm: Normal rate and regular rhythm.     Pulses: Normal pulses.           Dorsalis pedis pulses are 2+ on the right side and 2+ on the left side.     Heart sounds: Normal heart sounds.  Pulmonary:     Effort: Pulmonary effort is normal.     Breath sounds: Normal breath sounds.  Chest:     Breasts: Tanner Score is 5.        Right: Normal.        Left: Normal.  Abdominal:     General: Abdomen is flat. Bowel sounds are normal.     Palpations: Abdomen is soft.  Genitourinary:    Comments: deferred Musculoskeletal:        General: Normal range of motion.     Cervical back: Normal range of motion and neck supple.  Feet:     Right foot:  Protective Sensation: 5 sites tested. 5 sites sensed.     Skin integrity: Callus and dry skin present.     Toenail Condition: Right toenails are normal.     Left foot:     Protective Sensation: 5 sites tested. 5 sites sensed.     Skin integrity: Callus and dry skin present.     Toenail Condition: Left toenails are normal.  Skin:    General: Skin is warm and dry.     Findings: Acne present.          Comments: Comedone on left anterior chest  Neurological:     General: No focal deficit present.     Mental Status: She is alert and oriented to person, place, and time.  Psychiatric:        Mood and Affect: Mood normal.        Behavior: Behavior normal.         Assessment And Plan:     1. Routine general medical examination at health care facility  A full exam was performed.  Importance of monthly self breast exams was discussed with the patient. PATIENT IS ADVISED TO GET 30-45 MINUTES REGULAR EXERCISE NO LESS THAN FOUR TO FIVE DAYS PER WEEK - BOTH WEIGHTBEARING EXERCISES AND AEROBIC ARE RECOMMENDED. SHE IS ADVISED TO FOLLOW A HEALTHY DIET WITH AT LEAST SIX FRUITS/VEGGIES PER DAY, DECREASE INTAKE OF RED MEAT, AND TO INCREASE FISH INTAKE TO TWO DAYS PER WEEK.  MEATS/FISH SHOULD NOT BE FRIED, BAKED OR BROILED IS PREFERABLE.  I SUGGEST WEARING SPF 50 SUNSCREEN ON EXPOSED PARTS AND ESPECIALLY WHEN IN THE DIRECT  SUNLIGHT FOR AN EXTENDED PERIOD OF TIME.  PLEASE AVOID FAST FOOD RESTAURANTS AND INCREASE YOUR WATER INTAKE.  - CMP14+EGFR - CBC - Hemoglobin A1c  2. Diabetes mellitus type 2 in obese [facility])  Diabetic foot exam was performed. I DISCUSSED WITH THE PATIENT AT LENGTH REGARDING THE GOALS OF GLYCEMIC CONTROL AND POSSIBLE LONG-TERM COMPLICATIONS.  I  ALSO STRESSED THE IMPORTANCE OF COMPLIANCE WITH HOME GLUCOSE MONITORING, DIETARY RESTRICTIONS INCLUDING AVOIDANCE OF SUGARY DRINKS/PROCESSED FOODS,  ALONG WITH REGULAR EXERCISE.  I  ALSO STRESSED THE IMPORTANCE OF ANNUAL EYE EXAMS, SELF FOOT CARE AND COMPLIANCE WITH OFFICE VISITS.  - POCT Urinalysis Dipstick (81002) - POCT UA - Microalbumin  3. Essential hypertension  Chronic, well controlled. She will continue with current meds. She is encouraged to avoid adding salt to her foods. EKG performed, NSR w/o acute changes.   - EKG 12-Lead  4. Open comedone  After obtaining verbal consent, large comedone removed from her left anterior chest without any complications or bleeding. This was removed using comedone extractor, grayish substance was expressed from the area. No bleeding occurred during the procedure.   5. Class 1 obesity due to excess calories with serious comorbidity and body mass index (BMI) of 32.0 to 32.9 in adult  She is encouraged to strive for BMI less than 28 to decrease cardiac risk. She is advised to exercise 30 minutes five days per week. She is also encouraged to avoid sugary beverages and processed/packaged foods.    [name], MD    THE PATIENT IS ENCOURAGED TO PRACTICE SOCIAL DISTANCING DUE TO THE COVID-19 PANDEMIC.

## 2019-11-12 NOTE — Patient Instructions (Signed)
Health Maintenance, Female Adopting a healthy lifestyle and getting preventive care are important in promoting health and wellness. Ask your health care provider about:  The right schedule for you to have regular tests and exams.  Things you can do on your own to prevent diseases and keep yourself healthy. What should I know about diet, weight, and exercise? Eat a healthy diet   Eat a diet that includes plenty of vegetables, fruits, low-fat dairy products, and lean protein.  Do not eat a lot of foods that are high in solid fats, added sugars, or sodium. Maintain a healthy weight Body mass index (BMI) is used to identify weight problems. It estimates body fat based on height and weight. Your health care provider can help determine your BMI and help you achieve or maintain a healthy weight. Get regular exercise Get regular exercise. This is one of the most important things you can do for your health. Most adults should:  Exercise for at least 150 minutes each week. The exercise should increase your heart rate and make you sweat (moderate-intensity exercise).  Do strengthening exercises at least twice a week. This is in addition to the moderate-intensity exercise.  Spend less time sitting. Even light physical activity can be beneficial. Watch cholesterol and blood lipids Have your blood tested for lipids and cholesterol at 59 years of age, then have this test every 5 years. Have your cholesterol levels checked more often if:  Your lipid or cholesterol levels are high.  You are older than 59 years of age.  You are at high risk for heart disease. What should I know about cancer screening? Depending on your health history and family history, you may need to have cancer screening at various ages. This may include screening for:  Breast cancer.  Cervical cancer.  Colorectal cancer.  Skin cancer.  Lung cancer. What should I know about heart disease, diabetes, and high blood  pressure? Blood pressure and heart disease  High blood pressure causes heart disease and increases the risk of stroke. This is more likely to develop in people who have high blood pressure readings, are of African descent, or are overweight.  Have your blood pressure checked: ? Every 3-5 years if you are 18-39 years of age. ? Every year if you are 40 years old or older. Diabetes Have regular diabetes screenings. This checks your fasting blood sugar level. Have the screening done:  Once every three years after age 40 if you are at a normal weight and have a low risk for diabetes.  More often and at a younger age if you are overweight or have a high risk for diabetes. What should I know about preventing infection? Hepatitis B If you have a higher risk for hepatitis B, you should be screened for this virus. Talk with your health care provider to find out if you are at risk for hepatitis B infection. Hepatitis C Testing is recommended for:  Everyone born from 1945 through 1965.  Anyone with known risk factors for hepatitis C. Sexually transmitted infections (STIs)  Get screened for STIs, including gonorrhea and chlamydia, if: ? You are sexually active and are younger than 59 years of age. ? You are older than 59 years of age and your health care provider tells you that you are at risk for this type of infection. ? Your sexual activity has changed since you were last screened, and you are at increased risk for chlamydia or gonorrhea. Ask your health care provider if   you are at risk.  Ask your health care provider about whether you are at high risk for HIV. Your health care provider may recommend a prescription medicine to help prevent HIV infection. If you choose to take medicine to prevent HIV, you should first get tested for HIV. You should then be tested every 3 months for as long as you are taking the medicine. Pregnancy  If you are about to stop having your period (premenopausal) and  you may become pregnant, seek counseling before you get pregnant.  Take 400 to 800 micrograms (mcg) of folic acid every day if you become pregnant.  Ask for birth control (contraception) if you want to prevent pregnancy. Osteoporosis and menopause Osteoporosis is a disease in which the bones lose minerals and strength with aging. This can result in bone fractures. If you are 65 years old or older, or if you are at risk for osteoporosis and fractures, ask your health care provider if you should:  Be screened for bone loss.  Take a calcium or vitamin D supplement to lower your risk of fractures.  Be given hormone replacement therapy (HRT) to treat symptoms of menopause. Follow these instructions at home: Lifestyle  Do not use any products that contain nicotine or tobacco, such as cigarettes, e-cigarettes, and chewing tobacco. If you need help quitting, ask your health care provider.  Do not use street drugs.  Do not share needles.  Ask your health care provider for help if you need support or information about quitting drugs. Alcohol use  Do not drink alcohol if: ? Your health care provider tells you not to drink. ? You are pregnant, may be pregnant, or are planning to become pregnant.  If you drink alcohol: ? Limit how much you use to 0-1 drink a day. ? Limit intake if you are breastfeeding.  Be aware of how much alcohol is in your drink. In the [place] one drink equals one 12 oz bottle of beer (355 mL), one 5 oz glass of wine (148 mL), or one 1 oz glass of hard liquor (44 mL). General instructions  Schedule regular health, dental, and eye exams.  Stay current with your vaccines.  Tell your health care provider if: ? You often feel depressed. ? You have ever been abused or do not feel safe at home. Summary  Adopting a healthy lifestyle and getting preventive care are important in promoting health and wellness.  Follow your health care provider's instructions about healthy  diet, exercising, and getting tested or screened for diseases.  Follow your health care provider's instructions on monitoring your cholesterol and blood pressure. This information is not intended to replace advice given to you by your health care provider. Make sure you discuss any questions you have with your health care provider. Document Revised: 09/04/2018 Document Reviewed: 09/04/2018 Elsevier Patient Education  2020 Elsevier Inc.  

## 2019-11-13 LAB — CMP14+EGFR
ALT: 18 IU/L (ref 0–32)
AST: 20 IU/L (ref 0–40)
Albumin/Globulin Ratio: 1.4 (ref 1.2–2.2)
Albumin: 4.3 g/dL (ref 3.8–4.9)
Alkaline Phosphatase: 155 IU/L — ABNORMAL HIGH (ref 39–117)
BUN/Creatinine Ratio: 14 (ref 9–23)
BUN: 15 mg/dL (ref 6–24)
Bilirubin Total: 0.2 mg/dL (ref 0.0–1.2)
CO2: 22 mmol/L (ref 20–29)
Calcium: 9.8 mg/dL (ref 8.7–10.2)
Chloride: 106 mmol/L (ref 96–106)
Creatinine, Ser: 1.07 mg/dL — ABNORMAL HIGH (ref 0.57–1.00)
GFR calc Af Amer: 66 mL/min/{1.73_m2} (ref >59)
GFR calc non Af Amer: 57 mL/min/{1.73_m2} — ABNORMAL LOW (ref >59)
Globulin, Total: 3 g/dL (ref 1.5–4.5)
Glucose: 148 mg/dL — ABNORMAL HIGH (ref 65–99)
Potassium: 4.4 mmol/L (ref 3.5–5.2)
Sodium: 142 mmol/L (ref 134–144)
Total Protein: 7.3 g/dL (ref 6.0–8.5)

## 2019-11-13 LAB — CBC
Hematocrit: 38.9 % (ref 34.0–46.6)
Hemoglobin: 12.8 g/dL (ref 11.1–15.9)
MCH: 26.1 pg — ABNORMAL LOW (ref 26.6–33.0)
MCHC: 32.9 g/dL (ref 31.5–35.7)
MCV: 79 fL (ref 79–97)
Platelets: 277 10*3/uL (ref 150–450)
RBC: 4.9 x10E6/uL (ref 3.77–5.28)
RDW: 14.4 % (ref 11.7–15.4)
WBC: 6.7 10*3/uL (ref 3.4–10.8)

## 2019-11-13 LAB — HEMOGLOBIN A1C
Est. average glucose Bld gHb Est-mCnc: 163 mg/dL
Hgb A1c MFr Bld: 7.3 % — ABNORMAL HIGH (ref 4.8–5.6)

## 2019-11-14 ENCOUNTER — Other Ambulatory Visit: Payer: Self-pay | Admitting: Internal Medicine

## 2019-11-14 ENCOUNTER — Other Ambulatory Visit: Payer: Self-pay | Admitting: Nurse Practitioner

## 2019-12-01 ENCOUNTER — Other Ambulatory Visit: Payer: Self-pay | Admitting: Internal Medicine

## 2019-12-01 ENCOUNTER — Encounter: Payer: Self-pay | Admitting: Internal Medicine

## 2019-12-12 ENCOUNTER — Other Ambulatory Visit: Payer: Self-pay | Admitting: Internal Medicine

## 2020-01-01 ENCOUNTER — Encounter: Payer: Self-pay | Admitting: Internal Medicine

## 2020-01-21 ENCOUNTER — Other Ambulatory Visit: Payer: Self-pay | Admitting: Internal Medicine

## 2020-01-26 ENCOUNTER — Other Ambulatory Visit: Payer: Self-pay | Admitting: Internal Medicine

## 2020-01-26 NOTE — Telephone Encounter (Signed)
Gabapentin refill

## 2020-01-27 ENCOUNTER — Other Ambulatory Visit: Payer: Self-pay | Admitting: Internal Medicine

## 2020-01-28 ENCOUNTER — Other Ambulatory Visit: Payer: Self-pay | Admitting: Internal Medicine

## 2020-02-18 ENCOUNTER — Other Ambulatory Visit: Payer: Self-pay | Admitting: Internal Medicine

## 2020-02-26 ENCOUNTER — Other Ambulatory Visit: Payer: Self-pay | Admitting: Internal Medicine

## 2020-03-11 ENCOUNTER — Ambulatory Visit: Payer: BC Managed Care – PPO | Admitting: Internal Medicine

## 2020-03-11 ENCOUNTER — Encounter: Payer: Self-pay | Admitting: Internal Medicine

## 2020-03-11 ENCOUNTER — Other Ambulatory Visit: Payer: Self-pay

## 2020-03-11 VITALS — BP 112/76 | HR 78 | Temp 98.0°F | Ht 60.6 in | Wt 175.6 lb

## 2020-03-11 DIAGNOSIS — I10 Essential (primary) hypertension: Secondary | ICD-10-CM | POA: Diagnosis not present

## 2020-03-11 DIAGNOSIS — E6609 Other obesity due to excess calories: Secondary | ICD-10-CM

## 2020-03-11 DIAGNOSIS — Z1239 Encounter for other screening for malignant neoplasm of breast: Secondary | ICD-10-CM | POA: Diagnosis not present

## 2020-03-11 DIAGNOSIS — E669 Obesity, unspecified: Secondary | ICD-10-CM

## 2020-03-11 DIAGNOSIS — E1169 Type 2 diabetes mellitus with other specified complication: Secondary | ICD-10-CM

## 2020-03-11 DIAGNOSIS — Z6833 Body mass index (BMI) 33.0-33.9, adult: Secondary | ICD-10-CM

## 2020-03-11 NOTE — Progress Notes (Signed)
This visit occurred during the SARS-CoV-2 public health emergency.  Safety protocols were in place, including screening questions prior to the visit, additional usage of staff PPE, and extensive cleaning of exam room while observing appropriate contact time as indicated for disinfecting solutions.  Subjective:     Patient ID: Teresa Grant , female    DOB: [birth year] , 59 y.o.   MRN: [id number]   Chief Complaint  Patient presents with  . Diabetes    hemoglobin a1c / mammogram  . Hypertension    HPI  She is here today for a diabetes check.  She admits she is not taking meds as prescribed. Also admits to drinking juices on the weekends.  Diabetes She presents for her follow-up diabetic visit. She has type 2 diabetes mellitus. Her disease course has been stable. There are no hypoglycemic associated symptoms. Pertinent negatives for diabetes include no blurred vision and no chest pain. There are no hypoglycemic complications. Risk factors for coronary artery disease include diabetes mellitus, dyslipidemia, hypertension, obesity, post-menopausal, sedentary lifestyle and stress. She is following a diabetic diet. She participates in exercise intermittently. Her breakfast blood glucose is taken between 8-9 am. Her breakfast blood glucose range is generally 130-140 mg/dl. An ACE inhibitor/angiotensin II receptor blocker is being taken. Eye exam is current.  Hypertension This is a chronic problem. The current episode started more than 1 year ago. The problem has been gradually improving since onset. The problem is controlled. Associated symptoms include neck pain. Pertinent negatives include no blurred vision or chest pain. The current treatment provides moderate improvement. Compliance problems include exercise.      Past Medical History:  Diagnosis Date  . Diabetes mellitus   . Dysrhythmia    1985  . Heart murmur   . HLD (hyperlipidemia)   . Hypertension   . Kidney stone on right side  03/2013  . Pneumonia    7 yrs ago     Family History  Problem Relation Age of Onset  . Heart disease Mother   . Kidney disease Father   . Esophageal cancer Maternal Uncle   . Colon cancer Cousin   . Stroke Neg Hx   . Cancer Neg Hx   . Colon polyps Neg Hx   . Rectal cancer Neg Hx   . Stomach cancer Neg Hx      Current Outpatient Medications:  .  amLODipine (NORVASC) 10 MG tablet, TAKE ONE TABLET BY MOUTH DAILY, Disp: 60 tablet, Rfl: 0 .  Ascorbic Acid (VITAMIN C PO), Take by mouth., Disp: , Rfl:  .  gabapentin (NEURONTIN) 100 MG capsule, TAKE ONE CAPSULE BY MOUTH THREE TIMES A DAY, Disp: 90 capsule, Rfl: 0 .  glucose blood (ONETOUCH VERIO) test strip, Use as directed to check blood sugars 2 times per day dx: e11.65, Disp: 100 each, Rfl: 10 .  metFORMIN (GLUCOPHAGE) 1000 MG tablet, TAKE ONE TABLET BY MOUTH TWICE A DAY WITH MORNING AND EVENING MEALS, Disp: 180 tablet, Rfl: 0 .  OZEMPIC, 0.25 OR 0.5 MG/DOSE, 2 MG/1.5ML SOPN, DIAL AND INJECT UNDER THE SKIN 0.5 MG WEEKLY, Disp: 3 pen, Rfl: 2 .  pravastatin (PRAVACHOL) 40 MG tablet, TAKE ONE TABLET BY MOUTH EVERY NIGHT AT BEDTIME, Disp: 90 tablet, Rfl: 1 .  telmisartan (MICARDIS) 20 MG tablet, TAKE ONE TABLET BY MOUTH DAILY, Disp: 90 tablet, Rfl: 1 .  TRESIBA FLEXTOUCH 200 UNIT/ML FlexTouch Pen, INJECT 44 UNITS INTO THE SKIN EVERY NIGHT AT BEDTIME. DO NOT EXCEED 100 UNITS, Disp: 15  pen, Rfl: 1 .  Zinc Sulfate (ZINCATE PO), Take by mouth., Disp: , Rfl:    Allergies  Allergen Reactions  . Lisinopril Cough  . Hydrochlorothiazide Other (See Comments)    pancreatitis     Review of Systems  Constitutional: Negative.   Eyes: Negative for blurred vision.  Respiratory: Negative.   Cardiovascular: Negative.  Negative for chest pain.  Gastrointestinal: Negative.   Musculoskeletal: Positive for neck pain.  Neurological: Negative.   Psychiatric/Behavioral: Negative.      Today's Vitals   03/11/20 1139  BP: 112/76  Pulse: 78  Temp: 98  F (36.7 C)  TempSrc: Oral  Weight: 175 lb 9.6 oz (79.7 kg)  Height: 5' 0.6" (1.539 m)   Body mass index is 33.62 kg/m.   Objective:  Physical Exam Vitals and nursing note reviewed.  Constitutional:      Appearance: Normal appearance.  HENT:     Head: Normocephalic and atraumatic.  Cardiovascular:     Rate and Rhythm: Normal rate and regular rhythm.     Heart sounds: Normal heart sounds.  Pulmonary:     Effort: Pulmonary effort is normal.     Breath sounds: Normal breath sounds.  Skin:    General: Skin is warm.  Neurological:     General: No focal deficit present.     Mental Status: She is alert.  Psychiatric:        Mood and Affect: Mood normal.        Behavior: Behavior normal.         Assessment And Plan:     1. Diabetes mellitus type 2 in obese (HCC)  Chronic, I will check labs as listed below. She is encouraged to cut back on her intake of sugary beverages. I will make further recommendations once her labs are available for review. She will rto 3 months for re-evaluation.   - BMP8+EGFR - Hemoglobin A1c  2. Essential hypertension  Chronic, well controlled. She will continue with current meds. She is encouraged to avoid adding salt to her foods.   3. Class 1 obesity due to excess calories with serious comorbidity and body mass index (BMI) of 33.0 to 33.9 in adult  She is encouraged to strive for BMI less than 30 to decrease cardiac risk. She is advised to incorporate more exercise into her daily routine. She is encouraged to aim for at least 30 minutes five days per week.   4. Encounter for screening for malignant neoplasm of breast, unspecified screening modality  She agrees to [facility] referral for mammogram scheduling. Also encouraged to perform monthly self breast exams.   - MM Digital Screening; Future        [name], MD    THE PATIENT IS ENCOURAGED TO PRACTICE SOCIAL DISTANCING DUE TO THE COVID-19 PANDEMIC.

## 2020-03-11 NOTE — Patient Instructions (Addendum)
STOP Januvia  Increase Ozempic 0.5mg  PLUS 0.25mg  ONCE WEEKLY ON THURSDAYS  Diabetes Mellitus and Exercise Exercising regularly is important for your overall health, especially when you have diabetes (diabetes mellitus). Exercising is not only about losing weight. It has many other health benefits, such as increasing muscle strength and bone density and reducing body fat and stress. This leads to improved fitness, flexibility, and endurance, all of which result in better overall health. Exercise has additional benefits for people with diabetes, including:  Reducing appetite.  Helping to lower and control blood glucose.  Lowering blood pressure.  Helping to control amounts of fatty substances (lipids) in the blood, such as cholesterol and triglycerides.  Helping the body to respond better to insulin (improving insulin sensitivity).  Reducing how much insulin the body needs.  Decreasing the risk for heart disease by: ? Lowering cholesterol and triglyceride levels. ? Increasing the levels of good cholesterol. ? Lowering blood glucose levels. What is my activity plan? Your health care provider or certified diabetes educator can help you make a plan for the type and frequency of exercise (activity plan) that works for you. Make sure that you:  Do at least 150 minutes of moderate-intensity or vigorous-intensity exercise each week. This could be brisk walking, biking, or water aerobics. ? Do stretching and strength exercises, such as yoga or weightlifting, at least 2 times a week. ? Spread out your activity over at least 3 days of the week.  Get some form of physical activity every day. ? Do not go more than 2 days in a row without some kind of physical activity. ? Avoid being inactive for more than 30 minutes at a time. Take frequent breaks to walk or stretch.  Choose a type of exercise or activity that you enjoy, and set realistic goals.  Start slowly, and gradually increase the  intensity of your exercise over time. What do I need to know about managing my diabetes?   Check your blood glucose before and after exercising. ? If your blood glucose is 240 mg/dL (13.3 mmol/L) or higher before you exercise, check your urine for ketones. If you have ketones in your urine, do not exercise until your blood glucose returns to normal. ? If your blood glucose is 100 mg/dL (5.6 mmol/L) or lower, eat a snack containing 15-20 grams of carbohydrate. Check your blood glucose 15 minutes after the snack to make sure that your level is above 100 mg/dL (5.6 mmol/L) before you start your exercise.  Know the symptoms of low blood glucose (hypoglycemia) and how to treat it. Your risk for hypoglycemia increases during and after exercise. Common symptoms of hypoglycemia can include: ? Hunger. ? Anxiety. ? Sweating and feeling clammy. ? Confusion. ? Dizziness or feeling light-headed. ? Increased heart rate or palpitations. ? Blurry vision. ? Tingling or numbness around the mouth, lips, or tongue. ? Tremors or shakes. ? Irritability.  Keep a rapid-acting carbohydrate snack available before, during, and after exercise to help prevent or treat hypoglycemia.  Avoid injecting insulin into areas of the body that are going to be exercised. For example, avoid injecting insulin into: ? The arms, when playing tennis. ? The legs, when jogging.  Keep records of your exercise habits. Doing this can help you and your health care provider adjust your diabetes management plan as needed. Write down: ? Food that you eat before and after you exercise. ? Blood glucose levels before and after you exercise. ? The type and amount of exercise you  have done. ? When your insulin is expected to peak, if you use insulin. Avoid exercising at times when your insulin is peaking.  When you start a new exercise or activity, work with your health care provider to make sure the activity is safe for you, and to adjust  your insulin, medicines, or food intake as needed.  Drink plenty of water while you exercise to prevent dehydration or heat stroke. Drink enough fluid to keep your urine clear or pale yellow. Summary  Exercising regularly is important for your overall health, especially when you have diabetes (diabetes mellitus).  Exercising has many health benefits, such as increasing muscle strength and bone density and reducing body fat and stress.  Your health care provider or certified diabetes educator can help you make a plan for the type and frequency of exercise (activity plan) that works for you.  When you start a new exercise or activity, work with your health care provider to make sure the activity is safe for you, and to adjust your insulin, medicines, or food intake as needed. This information is not intended to replace advice given to you by your health care provider. Make sure you discuss any questions you have with your health care provider. Document Revised: 04/05/2017 Document Reviewed: 02/21/2016 Elsevier Patient Education  [place].

## 2020-03-12 LAB — HEMOGLOBIN A1C
Est. average glucose Bld gHb Est-mCnc: 192 mg/dL
Hgb A1c MFr Bld: 8.3 % — ABNORMAL HIGH (ref 4.8–5.6)

## 2020-03-12 LAB — BMP8+EGFR
BUN/Creatinine Ratio: 15 (ref 9–23)
BUN: 17 mg/dL (ref 6–24)
CO2: 24 mmol/L (ref 20–29)
Calcium: 9.8 mg/dL (ref 8.7–10.2)
Chloride: 104 mmol/L (ref 96–106)
Creatinine, Ser: 1.17 mg/dL — ABNORMAL HIGH (ref 0.57–1.00)
GFR calc Af Amer: 59 mL/min/{1.73_m2} — ABNORMAL LOW (ref >59)
GFR calc non Af Amer: 51 mL/min/{1.73_m2} — ABNORMAL LOW (ref >59)
Glucose: 108 mg/dL — ABNORMAL HIGH (ref 65–99)
Potassium: 4.4 mmol/L (ref 3.5–5.2)
Sodium: 141 mmol/L (ref 134–144)

## 2020-03-18 ENCOUNTER — Other Ambulatory Visit: Payer: Self-pay

## 2020-03-18 ENCOUNTER — Ambulatory Visit
Admission: RE | Admit: 2020-03-18 | Discharge: 2020-03-18 | Disposition: A | Discharge status: Home / Self Care | Payer: BC Managed Care – PPO | Source: Ambulatory Visit | Attending: Internal Medicine | Admitting: Internal Medicine

## 2020-03-18 DIAGNOSIS — Z1239 Encounter for other screening for malignant neoplasm of breast: Secondary | ICD-10-CM

## 2020-04-08 ENCOUNTER — Other Ambulatory Visit: Payer: Self-pay | Admitting: Internal Medicine

## 2020-04-14 ENCOUNTER — Other Ambulatory Visit: Payer: Self-pay | Admitting: Internal Medicine

## 2020-04-26 ENCOUNTER — Other Ambulatory Visit: Payer: Self-pay | Admitting: Internal Medicine

## 2020-05-24 ENCOUNTER — Other Ambulatory Visit: Payer: Self-pay | Admitting: Internal Medicine

## 2020-05-31 ENCOUNTER — Other Ambulatory Visit: Payer: Self-pay | Admitting: Internal Medicine

## 2020-06-03 ENCOUNTER — Telehealth: Payer: Self-pay

## 2020-06-03 ENCOUNTER — Other Ambulatory Visit: Payer: Self-pay | Admitting: Internal Medicine

## 2020-06-03 NOTE — Telephone Encounter (Signed)
The pt was told to schedule an appt on line with [facility] or [facility] to have a covid test because the pt's 45yrs old granddaughter tested positive for covid.  The pt said that she scheduled an appt with urgent care.

## 2020-06-04 ENCOUNTER — Telehealth: Payer: Self-pay

## 2020-06-04 NOTE — Telephone Encounter (Signed)
Prior auth completed for telmisartan 20 mg. Waiting on a reply from the pt's insurance company.

## 2020-06-10 ENCOUNTER — Other Ambulatory Visit: Payer: Self-pay | Admitting: Internal Medicine

## 2020-06-14 ENCOUNTER — Encounter: Payer: Self-pay | Admitting: Internal Medicine

## 2020-06-14 ENCOUNTER — Ambulatory Visit: Payer: BC Managed Care – PPO | Admitting: Internal Medicine

## 2020-06-14 ENCOUNTER — Other Ambulatory Visit: Payer: Self-pay

## 2020-06-14 VITALS — BP 138/76 | HR 76 | Temp 97.8°F | Ht 60.6 in | Wt 175.0 lb

## 2020-06-14 DIAGNOSIS — Z6833 Body mass index (BMI) 33.0-33.9, adult: Secondary | ICD-10-CM

## 2020-06-14 DIAGNOSIS — I10 Essential (primary) hypertension: Secondary | ICD-10-CM | POA: Diagnosis not present

## 2020-06-14 DIAGNOSIS — Z598 Other problems related to housing and economic circumstances: Secondary | ICD-10-CM

## 2020-06-14 DIAGNOSIS — R253 Fasciculation: Secondary | ICD-10-CM | POA: Diagnosis not present

## 2020-06-14 DIAGNOSIS — Z23 Encounter for immunization: Secondary | ICD-10-CM | POA: Diagnosis not present

## 2020-06-14 DIAGNOSIS — E1165 Type 2 diabetes mellitus with hyperglycemia: Secondary | ICD-10-CM | POA: Diagnosis not present

## 2020-06-14 DIAGNOSIS — Z5989 Other problems related to housing and economic circumstances: Secondary | ICD-10-CM

## 2020-06-14 DIAGNOSIS — E6609 Other obesity due to excess calories: Secondary | ICD-10-CM

## 2020-06-14 NOTE — Patient Instructions (Signed)
Diabetes Mellitus and Exercise Exercising regularly is important for your overall health, especially when you have diabetes (diabetes mellitus). Exercising is not only about losing weight. It has many other health benefits, such as increasing muscle strength and bone density and reducing body fat and stress. This leads to improved fitness, flexibility, and endurance, all of which result in better overall health. Exercise has additional benefits for people with diabetes, including:  Reducing appetite.  Helping to lower and control blood glucose.  Lowering blood pressure.  Helping to control amounts of fatty substances (lipids) in the blood, such as cholesterol and triglycerides.  Helping the body to respond better to insulin (improving insulin sensitivity).  Reducing how much insulin the body needs.  Decreasing the risk for heart disease by: ? Lowering cholesterol and triglyceride levels. ? Increasing the levels of good cholesterol. ? Lowering blood glucose levels. What is my activity plan? Your health care provider or certified diabetes educator can help you make a plan for the type and frequency of exercise (activity plan) that works for you. Make sure that you:  Do at least 150 minutes of moderate-intensity or vigorous-intensity exercise each week. This could be brisk walking, biking, or water aerobics. ? Do stretching and strength exercises, such as yoga or weightlifting, at least 2 times a week. ? Spread out your activity over at least 3 days of the week.  Get some form of physical activity every day. ? Do not go more than 2 days in a row without some kind of physical activity. ? Avoid being inactive for more than 30 minutes at a time. Take frequent breaks to walk or stretch.  Choose a type of exercise or activity that you enjoy, and set realistic goals.  Start slowly, and gradually increase the intensity of your exercise over time. What do I need to know about managing my  diabetes?   Check your blood glucose before and after exercising. ? If your blood glucose is 240 mg/dL (13.3 mmol/L) or higher before you exercise, check your urine for ketones. If you have ketones in your urine, do not exercise until your blood glucose returns to normal. ? If your blood glucose is 100 mg/dL (5.6 mmol/L) or lower, eat a snack containing 15-20 grams of carbohydrate. Check your blood glucose 15 minutes after the snack to make sure that your level is above 100 mg/dL (5.6 mmol/L) before you start your exercise.  Know the symptoms of low blood glucose (hypoglycemia) and how to treat it. Your risk for hypoglycemia increases during and after exercise. Common symptoms of hypoglycemia can include: ? Hunger. ? Anxiety. ? Sweating and feeling clammy. ? Confusion. ? Dizziness or feeling light-headed. ? Increased heart rate or palpitations. ? Blurry vision. ? Tingling or numbness around the mouth, lips, or tongue. ? Tremors or shakes. ? Irritability.  Keep a rapid-acting carbohydrate snack available before, during, and after exercise to help prevent or treat hypoglycemia.  Avoid injecting insulin into areas of the body that are going to be exercised. For example, avoid injecting insulin into: ? The arms, when playing tennis. ? The legs, when jogging.  Keep records of your exercise habits. Doing this can help you and your health care provider adjust your diabetes management plan as needed. Write down: ? Food that you eat before and after you exercise. ? Blood glucose levels before and after you exercise. ? The type and amount of exercise you have done. ? When your insulin is expected to peak, if you use   insulin. Avoid exercising at times when your insulin is peaking.  When you start a new exercise or activity, work with your health care provider to make sure the activity is safe for you, and to adjust your insulin, medicines, or food intake as needed.  Drink plenty of water while  you exercise to prevent dehydration or heat stroke. Drink enough fluid to keep your urine clear or pale yellow. Summary  Exercising regularly is important for your overall health, especially when you have diabetes (diabetes mellitus).  Exercising has many health benefits, such as increasing muscle strength and bone density and reducing body fat and stress.  Your health care provider or certified diabetes educator can help you make a plan for the type and frequency of exercise (activity plan) that works for you.  When you start a new exercise or activity, work with your health care provider to make sure the activity is safe for you, and to adjust your insulin, medicines, or food intake as needed. This information is not intended to replace advice given to you by your health care provider. Make sure you discuss any questions you have with your health care provider. Document Revised: 04/05/2017 Document Reviewed: 02/21/2016 Elsevier Patient Education  2020 [employer] Inc.  

## 2020-06-14 NOTE — Progress Notes (Signed)
I,Tianna Badgett,acting as a Education administrator for [name], MD.,have documented all relevant documentation on the behalf of [name], MD,as directed by  [name], MD while in the presence of [name], MD.  This visit occurred during the SARS-CoV-2 public health emergency.  Safety protocols were in place, including screening questions prior to the visit, additional usage of staff PPE, and extensive cleaning of exam room while observing appropriate contact time as indicated for disinfecting solutions.  Subjective:     Patient ID: Teresa Grant , female    DOB: Aug 31, 1961 , 59 y.o.   MRN: [id number]   Chief Complaint  Patient presents with  . Diabetes  . Hypertension    HPI  She is here today for a diabetes check.  She states that she is taking medications as prescribed. She reports that she is very stressed out due to her living conditions so she has been eating lots of snacks, mostly friuts. She has recently been living with different family members. Her home was recently sold by her landlord leaving them with nowhere to go.   Diabetes She presents for her follow-up diabetic visit. She has type 2 diabetes mellitus. Her disease course has been stable. There are no hypoglycemic associated symptoms. Pertinent negatives for diabetes include no blurred vision and no chest pain. There are no hypoglycemic complications. Risk factors for coronary artery disease include diabetes mellitus, dyslipidemia, hypertension, obesity, post-menopausal, sedentary lifestyle and stress. She is following a diabetic diet. She participates in exercise intermittently. Her breakfast blood glucose is taken between 8-9 am. Her breakfast blood glucose range is generally 130-140 mg/dl. An ACE inhibitor/angiotensin II receptor blocker is being taken. Eye exam is current.  Hypertension This is a chronic problem. The current episode started more than 1 year ago. The problem has been gradually improving since  onset. The problem is controlled. Pertinent negatives include no blurred vision or chest pain. The current treatment provides moderate improvement. Compliance problems include exercise.      Past Medical History:  Diagnosis Date  . Diabetes mellitus   . Dysrhythmia    1985  . Heart murmur   . HLD (hyperlipidemia)   . Hypertension   . Kidney stone on right side 03/2013  . Pneumonia    7 yrs ago     Family History  Problem Relation Age of Onset  . Heart disease Mother   . Kidney disease Father   . Esophageal cancer Maternal Uncle   . Colon cancer Cousin   . Stroke Neg Hx   . Cancer Neg Hx   . Colon polyps Neg Hx   . Rectal cancer Neg Hx   . Stomach cancer Neg Hx      Current Outpatient Medications:  .  amLODipine (NORVASC) 10 MG tablet, TAKE ONE TABLET BY MOUTH DAILY, Disp: 60 tablet, Rfl: 0 .  Ascorbic Acid (VITAMIN C PO), Take by mouth., Disp: , Rfl:  .  ELDERBERRY PO, Take by mouth., Disp: , Rfl:  .  gabapentin (NEURONTIN) 100 MG capsule, TAKE ONE CAPSULE BY MOUTH THREE TIMES A DAY (Patient taking differently: TAKE ONE CAPSULE BY MOUTH TWO TIMES A DAY), Disp: 90 capsule, Rfl: 0 .  glucose blood (ONETOUCH VERIO) test strip, Use as directed to check blood sugars 2 times per day dx: e11.65, Disp: 100 each, Rfl: 10 .  metFORMIN (GLUCOPHAGE) 1000 MG tablet, TAKE ONE TABLET BY MOUTH TWICE A DAY WITH MORNING AND EVENING MEALS, Disp: 180 tablet, Rfl:  0 .  OZEMPIC, 0.25 OR 0.5 MG/DOSE, 2 MG/1.5ML SOPN, DIAL AND INJECT UNDER THE SKIN 0.5 MG WEEKLY, Disp: 3 pen, Rfl: 2 .  pravastatin (PRAVACHOL) 40 MG tablet, TAKE ONE TABLET BY MOUTH EVERY NIGHT AT BEDTIME, Disp: 90 tablet, Rfl: 1 .  telmisartan (MICARDIS) 20 MG tablet, TAKE ONE TABLET BY MOUTH DAILY, Disp: 90 tablet, Rfl: 1 .  TRESIBA FLEXTOUCH 200 UNIT/ML FlexTouch Pen, INJECT 44 UNITS UNDER THE SKIN EVERY NIGHT AT BEDTIME **DO NOT EXCEED 100 UNITS**, Disp: 15 mL, Rfl: 1 .  Zinc Sulfate (ZINCATE PO), Take by mouth., Disp: , Rfl:     Allergies  Allergen Reactions  . Lisinopril Cough  . Hydrochlorothiazide Other (See Comments)    pancreatitis     Review of Systems  Constitutional: Negative.   Eyes: Negative for blurred vision.  Respiratory: Negative.   Cardiovascular: Negative.  Negative for chest pain.  Gastrointestinal: Negative.   Neurological: Negative.      Today's Vitals   06/14/20 1134  BP: 138/76  Pulse: 76  Temp: 97.8 F (36.6 C)  TempSrc: Oral  Weight: 175 lb (79.4 kg)  Height: 5' 0.6" (1.539 m)   Body mass index is 33.5 kg/m.   Objective:  Physical Exam Vitals and nursing note reviewed.  Constitutional:      Appearance: Normal appearance.  HENT:     Head: Normocephalic and atraumatic.  Cardiovascular:     Rate and Rhythm: Normal rate and regular rhythm.     Heart sounds: Normal heart sounds.  Pulmonary:     Effort: Pulmonary effort is normal.     Breath sounds: Normal breath sounds.  Musculoskeletal:     Cervical back: Normal range of motion.     Comments: Fasciculations of left upper eyelid.   Skin:    General: Skin is warm.  Neurological:     General: No focal deficit present.     Mental Status: She is alert.  Psychiatric:        Mood and Affect: Mood normal.        Behavior: Behavior normal.         Assessment And Plan:     1. Uncontrolled type 2 diabetes mellitus with hyperglycemia ([place]) Comments: I will check labs as listed below. Pt advised I may need to increase the dose of her Ozempic.  - Hemoglobin A1c - CMP14+EGFR  2. Essential hypertension Comments: Chronic, fair control. She is aware that BP goal is less than 130/80.  She is encouraged to follow a low sodium diet, even while living with others.  - CMP14+EGFR  3. Fasciculations Comments: I will check Mg level. Advised to start taking magnesium 561m nightly. Advised to get this over the counter.  - Magnesium  4. Class 1 obesity due to excess calories with serious comorbidity and body mass index (BMI)  of 33.0 to 33.9 in adult Comments: Encouraged to strive for BMI less than 30 to decrease cardiac risk. Advised to aim for at least 150 minutes of exercise per week.   5. Need for influenza vaccination Comments: She was given flu vaccine to update her immunization history.  - Flu Vaccine QUAD 6+ mos PF IM (Fluarix Quad PF)  6. Living accommodation issues  I will refer her to [facility] team for help with community support.  - [facility] referral to [facility]      Patient was given opportunity to ask questions. Patient verbalized understanding of the plan and was able to repeat key elements of  the plan. All questions were answered to their satisfaction.  [name], MD   I, [name], MD, have reviewed all documentation for this visit. The documentation on 06/21/20 for the exam, diagnosis, procedures, and orders are all accurate and complete.  THE PATIENT IS ENCOURAGED TO PRACTICE SOCIAL DISTANCING DUE TO THE COVID-19 PANDEMIC.

## 2020-06-15 LAB — CMP14+EGFR
ALT: 18 IU/L (ref 0–32)
AST: 18 IU/L (ref 0–40)
Albumin/Globulin Ratio: 1.4 (ref 1.2–2.2)
Albumin: 4.3 g/dL (ref 3.8–4.9)
Alkaline Phosphatase: 151 IU/L — ABNORMAL HIGH (ref 44–121)
BUN/Creatinine Ratio: 11 (ref 9–23)
BUN: 11 mg/dL (ref 6–24)
Bilirubin Total: 0.4 mg/dL (ref 0.0–1.2)
CO2: 25 mmol/L (ref 20–29)
Calcium: 9.9 mg/dL (ref 8.7–10.2)
Chloride: 105 mmol/L (ref 96–106)
Creatinine, Ser: 1.03 mg/dL — ABNORMAL HIGH (ref 0.57–1.00)
GFR calc Af Amer: 69 mL/min/{1.73_m2} (ref >59)
GFR calc non Af Amer: 60 mL/min/{1.73_m2} (ref >59)
Globulin, Total: 3 g/dL (ref 1.5–4.5)
Glucose: 86 mg/dL (ref 65–99)
Potassium: 4.8 mmol/L (ref 3.5–5.2)
Sodium: 143 mmol/L (ref 134–144)
Total Protein: 7.3 g/dL (ref 6.0–8.5)

## 2020-06-15 LAB — MAGNESIUM: Magnesium: 1.8 mg/dL (ref 1.6–2.3)

## 2020-06-15 LAB — HEMOGLOBIN A1C
Est. average glucose Bld gHb Est-mCnc: 194 mg/dL
Hgb A1c MFr Bld: 8.4 % — ABNORMAL HIGH (ref 4.8–5.6)

## 2020-07-21 ENCOUNTER — Other Ambulatory Visit: Payer: Self-pay | Admitting: Internal Medicine

## 2020-08-15 ENCOUNTER — Other Ambulatory Visit: Payer: Self-pay | Admitting: Internal Medicine

## 2020-08-18 ENCOUNTER — Other Ambulatory Visit: Payer: Self-pay | Admitting: Internal Medicine

## 2020-09-06 ENCOUNTER — Other Ambulatory Visit: Payer: Self-pay | Admitting: Internal Medicine

## 2020-09-06 ENCOUNTER — Ambulatory Visit: Payer: BC Managed Care – PPO | Admitting: Internal Medicine

## 2020-09-17 ENCOUNTER — Encounter: Payer: Self-pay | Admitting: Internal Medicine

## 2020-09-18 ENCOUNTER — Other Ambulatory Visit: Payer: Self-pay | Admitting: Internal Medicine

## 2020-09-20 ENCOUNTER — Other Ambulatory Visit: Payer: Self-pay | Admitting: Internal Medicine

## 2020-09-22 ENCOUNTER — Other Ambulatory Visit: Payer: Self-pay

## 2020-09-22 ENCOUNTER — Ambulatory Visit: Payer: BC Managed Care – PPO | Admitting: Nurse Practitioner

## 2020-09-22 ENCOUNTER — Encounter: Payer: Self-pay | Admitting: Nurse Practitioner

## 2020-09-22 VITALS — BP 128/80 | HR 72 | Temp 98.0°F | Ht 62.8 in | Wt 172.2 lb

## 2020-09-22 DIAGNOSIS — E782 Mixed hyperlipidemia: Secondary | ICD-10-CM | POA: Diagnosis not present

## 2020-09-22 DIAGNOSIS — R200 Anesthesia of skin: Secondary | ICD-10-CM

## 2020-09-22 DIAGNOSIS — E1165 Type 2 diabetes mellitus with hyperglycemia: Secondary | ICD-10-CM

## 2020-09-22 DIAGNOSIS — Z6830 Body mass index (BMI) 30.0-30.9, adult: Secondary | ICD-10-CM

## 2020-09-22 DIAGNOSIS — E6609 Other obesity due to excess calories: Secondary | ICD-10-CM

## 2020-09-22 DIAGNOSIS — I10 Essential (primary) hypertension: Secondary | ICD-10-CM

## 2020-09-22 DIAGNOSIS — R202 Paresthesia of skin: Secondary | ICD-10-CM

## 2020-09-22 NOTE — Progress Notes (Signed)
I,Tianna Badgett,acting as a Education administrator for [employer], NP.,have documented all relevant documentation on the behalf of [employer], NP,as directed by  Bary Castilla, NP while in the presence of Bary Castilla, NP.  This visit occurred during the SARS-CoV-2 public health emergency.  Safety protocols were in place, including screening questions prior to the visit, additional usage of staff PPE, and extensive cleaning of exam room while observing appropriate contact time as indicated for disinfecting solutions.  Subjective:     Patient ID: Teresa Grant , female    DOB: 21-Jan-1961 , 59 y.o.   MRN: [id number]   Chief Complaint  Patient presents with  . Diabetes  . Hypertension    HPI  She is here today for a diabetes check.  She states that she is taking medications as prescribed. She also has tingling and numbness feeling in her left arm and hand for a while now. She does have history of carpel tunnel in the past.    Wt Readings from Last 3 Encounters: 09/22/20 : 172 lb 3.2 oz (78.1 kg) 06/14/20 : 175 lb (79.4 kg) 03/11/20 : 175 lb 9.6 oz (79.7 kg)  She is working on her diet.  Exercise: She does not exercise. She will also work on that as well.   Diabetes She presents for her follow-up diabetic visit. She has type 2 diabetes mellitus. Her disease course has been stable. There are no hypoglycemic associated symptoms. Pertinent negatives for hypoglycemia include no headaches. Pertinent negatives for diabetes include no blurred vision, no chest pain, no polydipsia, no polyphagia and no polyuria. There are no hypoglycemic complications. Risk factors for coronary artery disease include diabetes mellitus, dyslipidemia, hypertension, obesity, post-menopausal, sedentary lifestyle and stress. She is following a diabetic diet. She participates in exercise intermittently. Her breakfast blood glucose is taken between 8-9 am. Her breakfast blood glucose range is generally 130-140  mg/dl. An ACE inhibitor/angiotensin II receptor blocker is being taken. Eye exam is current.  Hypertension This is a chronic problem. The current episode started more than 1 year ago. The problem has been gradually improving since onset. The problem is controlled. Pertinent negatives include no blurred vision, chest pain, headaches or palpitations. The current treatment provides moderate improvement. Compliance problems include exercise.   Hand Pain  The incident occurred more than 1 week ago. The incident occurred at home. There was no injury mechanism. The pain is present in the left hand. The pain radiates to the left arm. The pain is moderate. Associated symptoms include numbness and tingling. Pertinent negatives include no chest pain.     Past Medical History:  Diagnosis Date  . Diabetes mellitus   . Dysrhythmia    1985  . Heart murmur   . HLD (hyperlipidemia)   . Hypertension   . Kidney stone on right side 03/2013  . Pneumonia    7 yrs ago     Family History  Problem Relation Age of Onset  . Heart disease Mother   . Kidney disease Father   . Esophageal cancer Maternal Uncle   . Colon cancer Cousin   . Stroke Neg Hx   . Cancer Neg Hx   . Colon polyps Neg Hx   . Rectal cancer Neg Hx   . Stomach cancer Neg Hx      Current Outpatient Medications:  .  amLODipine (NORVASC) 10 MG tablet, TAKE ONE TABLET BY MOUTH DAILY, Disp: 60 tablet, Rfl: 0 .  Ascorbic Acid (VITAMIN C PO), Take by mouth., Disp: ,  Rfl:  .  gabapentin (NEURONTIN) 100 MG capsule, TAKE ONE CAPSULE BY MOUTH THREE TIMES A DAY, Disp: 90 capsule, Rfl: 0 .  glucose blood (ONETOUCH VERIO) test strip, Use as directed to check blood sugars 2 times per day dx: e11.65, Disp: 100 each, Rfl: 10 .  metFORMIN (GLUCOPHAGE) 1000 MG tablet, TAKE ONE TABLET BY MOUTH TWICE A DAY WITH MORNING AND EVENING MEALS, Disp: 180 tablet, Rfl: 0 .  OZEMPIC, 0.25 OR 0.5 MG/DOSE, 2 MG/1.5ML SOPN, DIAL AND INJECT UNDER THE SKIN 0.5 MG WEEKLY,  Disp: 3 mL, Rfl: 3 .  pravastatin (PRAVACHOL) 40 MG tablet, TAKE ONE TABLET BY MOUTH EVERY NIGHT AT BEDTIME, Disp: 90 tablet, Rfl: 1 .  telmisartan (MICARDIS) 20 MG tablet, TAKE ONE TABLET BY MOUTH DAILY, Disp: 90 tablet, Rfl: 1 .  TRESIBA FLEXTOUCH 200 UNIT/ML FlexTouch Pen, INJECT 44 UNITS INTO THE SKIN EVERY NIGHT AT BEDTIME - DO NOT EXCEED 100 UNITS, Disp: 3 mL, Rfl: 1 .  Zinc Sulfate (ZINCATE PO), Take by mouth., Disp: , Rfl:    Allergies  Allergen Reactions  . Lisinopril Cough  . Hydrochlorothiazide Other (See Comments)    pancreatitis     Review of Systems  Constitutional: Negative.   Eyes: Negative for blurred vision.  Respiratory: Negative.   Cardiovascular: Negative.  Negative for chest pain and palpitations.  Gastrointestinal: Negative.   Endocrine: Negative for polydipsia, polyphagia and polyuria.  Neurological: Positive for tingling and numbness. Negative for headaches.       Tingling of left arm      Today's Vitals   09/22/20 1411  BP: 128/80  Pulse: 72  Temp: 98 F (36.7 C)  TempSrc: Oral  Weight: 172 lb 3.2 oz (78.1 kg)  Height: 5' 2.8" (1.595 m)   Body mass index is 30.7 kg/m.  Wt Readings from Last 3 Encounters:  09/22/20 172 lb 3.2 oz (78.1 kg)  06/14/20 175 lb (79.4 kg)  03/11/20 175 lb 9.6 oz (79.7 kg)    Objective:  Physical Exam Constitutional:      Appearance: She is obese.  Cardiovascular:     Rate and Rhythm: Normal rate and regular rhythm.     Heart sounds: Murmur heard.    Pulmonary:     Effort: Pulmonary effort is normal. No respiratory distress.     Breath sounds: Normal breath sounds. No wheezing.  Musculoskeletal:        General: Normal range of motion.     Right hand: Normal.     Comments: Positive for phalens and tinel's sign of left wrist. Numbness and tingling in left hand and wrist area.   Neurological:     Mental Status: She is alert and oriented to person, place, and time.     Motor: No weakness.     Gait: Gait  normal.         Assessment And Plan:     1. Uncontrolled type 2 diabetes mellitus with hyperglycemia ([place]) -Patient was educated on diet modification and exercise. She is advised to avoid sugary food and drinks.  -Patient will continue to take her medication  - Hemoglobin A1c   2. Essential hypertension -Patient was advised on eating a low sodium and avoid processed foods.  -Patient will continue taking her BP medication  - CMP14+EGFR  3. Class 1 obesity due to excess calories with serious comorbidity and body mass index (BMI) of 30.0 to 30.9 in adult -Importance of achieving optimal weight to decrease risk of cardiovascular disease and  cancers was discussed with the patient in full detail. She is encouraged to start slowly - start with 10 minutes twice daily at least three to four days per week and to gradually build to 30 minutes five days weekly. She was given tips to incorporate more activity into her daily routine - take stairs when possible, park farther away from her job, grocery stores, etc.   4. Numbness and tingling in left hand -Positive for phalens and tinel's test for carpel tunnel -Prescription for wrist splint  -Continue taking gabapentin TID for numbness and tingling - Nerve conduction test referral sent   5. Mixed hyperlipidemia -Patient will continue diet and exercise lifestyle modification to eat a low fat diet avoiding saturated fatty foods and drinks. Educated on importance of getting 30-45 min. Of exercise 4-5 a week.  - Lipid panel   She is encouraged to strive for BMI less than 26 to decrease cardiac risk. Advised to aim for at least 150 minutes of exercise per week.  Patient was given opportunity to ask questions. Patient verbalized understanding of the plan and was able to repeat key elements of the plan. All questions were answered to their satisfaction.  Bary Castilla, NP   I, Bary Castilla, NP, have reviewed all documentation for this visit.  The documentation on 09/22/20 for the exam, diagnosis, procedures, and orders are all accurate and complete.  THE PATIENT IS ENCOURAGED TO PRACTICE SOCIAL DISTANCING DUE TO THE COVID-19 PANDEMIC.

## 2020-09-22 NOTE — Patient Instructions (Signed)

## 2020-09-23 LAB — CMP14+EGFR
ALT: 16 IU/L (ref 0–32)
AST: 19 IU/L (ref 0–40)
Albumin/Globulin Ratio: 1.4 (ref 1.2–2.2)
Albumin: 4.2 g/dL (ref 3.8–4.9)
Alkaline Phosphatase: 171 IU/L — ABNORMAL HIGH (ref 44–121)
BUN/Creatinine Ratio: 7 — ABNORMAL LOW (ref 9–23)
BUN: 8 mg/dL (ref 6–24)
Bilirubin Total: 0.4 mg/dL (ref 0.0–1.2)
CO2: 26 mmol/L (ref 20–29)
Calcium: 9.8 mg/dL (ref 8.7–10.2)
Chloride: 106 mmol/L (ref 96–106)
Creatinine, Ser: 1.08 mg/dL — ABNORMAL HIGH (ref 0.57–1.00)
GFR calc Af Amer: 65 mL/min/{1.73_m2} (ref >59)
GFR calc non Af Amer: 56 mL/min/{1.73_m2} — ABNORMAL LOW (ref >59)
Globulin, Total: 2.9 g/dL (ref 1.5–4.5)
Glucose: 79 mg/dL (ref 65–99)
Potassium: 4.5 mmol/L (ref 3.5–5.2)
Sodium: 143 mmol/L (ref 134–144)
Total Protein: 7.1 g/dL (ref 6.0–8.5)

## 2020-09-23 LAB — LIPID PANEL
Chol/HDL Ratio: 4 ratio (ref 0.0–4.4)
Cholesterol, Total: 165 mg/dL (ref 100–199)
HDL: 41 mg/dL (ref >39)
LDL Chol Calc (NIH): 100 mg/dL — ABNORMAL HIGH (ref 0–99)
Triglycerides: 136 mg/dL (ref 0–149)
VLDL Cholesterol Cal: 24 mg/dL (ref 5–40)

## 2020-09-23 LAB — HEMOGLOBIN A1C
Est. average glucose Bld gHb Est-mCnc: 154 mg/dL
Hgb A1c MFr Bld: 7 % — ABNORMAL HIGH (ref 4.8–5.6)

## 2020-09-27 ENCOUNTER — Telehealth: Payer: Self-pay

## 2020-09-27 NOTE — Telephone Encounter (Signed)
Voicemail not set up.

## 2020-09-27 NOTE — Telephone Encounter (Signed)
-----   Message from Charlesetta Ivory, NP sent at 09/23/2020  5:20 PM EST ----- Your Hgb is better than what it was 3 months ago. It is at 7.0. Continue taking your medication and work on a healthy lifestyle and exercise. Avoid sugary foods and drinks. Increase your intake of lean meats and avoid red meats and fast food. Your alkaline phosphate are slightly elevated from last time we will continue to monitor this.

## 2020-10-13 ENCOUNTER — Other Ambulatory Visit: Payer: Self-pay | Admitting: Internal Medicine

## 2020-10-18 ENCOUNTER — Other Ambulatory Visit: Payer: Self-pay | Admitting: Internal Medicine

## 2020-10-18 ENCOUNTER — Other Ambulatory Visit: Payer: Self-pay

## 2020-10-18 MED ORDER — GABAPENTIN 100 MG PO CAPS: 100.0000 mg | ORAL_CAPSULE | Freq: Three times a day (TID) | ORAL | 3 refills | Status: DC

## 2020-11-09 ENCOUNTER — Other Ambulatory Visit: Payer: Self-pay | Admitting: Internal Medicine

## 2020-11-11 ENCOUNTER — Other Ambulatory Visit: Payer: Self-pay

## 2020-11-11 MED ORDER — AMLODIPINE BESYLATE 10 MG PO TABS: 10.0000 mg | ORAL_TABLET | Freq: Every day | ORAL | 1 refills | Status: DC

## 2020-11-11 MED ORDER — METFORMIN HCL 1000 MG PO TABS: ORAL_TABLET | ORAL | 1 refills | Status: DC

## 2020-11-16 ENCOUNTER — Encounter: Payer: BC Managed Care – PPO | Admitting: Internal Medicine

## 2020-12-01 ENCOUNTER — Other Ambulatory Visit: Payer: Self-pay | Admitting: Internal Medicine

## 2020-12-02 ENCOUNTER — Ambulatory Visit (INDEPENDENT_AMBULATORY_CARE_PROVIDER_SITE_OTHER): Payer: BC Managed Care – PPO | Admitting: Internal Medicine

## 2020-12-02 ENCOUNTER — Encounter: Payer: Self-pay | Admitting: Internal Medicine

## 2020-12-02 ENCOUNTER — Other Ambulatory Visit: Payer: Self-pay

## 2020-12-02 VITALS — BP 142/88 | HR 80 | Temp 97.8°F | Ht 62.8 in | Wt 172.0 lb

## 2020-12-02 DIAGNOSIS — Z6830 Body mass index (BMI) 30.0-30.9, adult: Secondary | ICD-10-CM

## 2020-12-02 DIAGNOSIS — E1165 Type 2 diabetes mellitus with hyperglycemia: Secondary | ICD-10-CM

## 2020-12-02 DIAGNOSIS — I10 Essential (primary) hypertension: Secondary | ICD-10-CM | POA: Diagnosis not present

## 2020-12-02 DIAGNOSIS — E6609 Other obesity due to excess calories: Secondary | ICD-10-CM

## 2020-12-02 DIAGNOSIS — Z0000 Encounter for general adult medical examination without abnormal findings: Secondary | ICD-10-CM | POA: Diagnosis not present

## 2020-12-02 LAB — POCT UA - MICROALBUMIN
Creatinine, POC: 200 mg/dL
Microalbumin Ur, POC: 150 mg/L

## 2020-12-02 LAB — POCT URINALYSIS DIPSTICK
Bilirubin, UA: NEGATIVE
Glucose, UA: NEGATIVE
Ketones, UA: NEGATIVE
Leukocytes, UA: NEGATIVE
Nitrite, UA: NEGATIVE
Protein, UA: POSITIVE — AB
Spec Grav, UA: 1.02 (ref 1.010–1.025)
Urobilinogen, UA: 0.2 E.U./dL
pH, UA: 5.5 (ref 5.0–8.0)

## 2020-12-02 NOTE — Progress Notes (Signed)
Teresa Grant as a scribe for Teresa Aliment, MD.,have documented all relevant documentation on the behalf of Teresa Aliment, MD,as directed by  Teresa Aliment, MD while in the presence of Teresa Aliment, MD. This visit occurred during the SARS-CoV-2 public health emergency.  Safety protocols were in place, including screening questions prior to the visit, additional usage of staff PPE, and extensive cleaning of exam room while observing appropriate contact time as indicated for disinfecting solutions.  Subjective:     Patient ID: Teresa Grant , female    DOB: [birth year] , 60 y.o.   MRN: [id number]   Chief Complaint  Patient presents with  . Annual Exam  . Diabetes  . Hypertension    HPI  She is here today for a full physical exam. She is followed by Dr. [facility] for her pelvic exams.  She can't recall her last visit. She reports compliance with meds. She denies having any headaches, chest pain and shortness of breath.   Diabetes She presents for her follow-up diabetic visit. She has type 2 diabetes mellitus. Her disease course has been stable. There are no hypoglycemic associated symptoms. Pertinent negatives for hypoglycemia include no dizziness or headaches. Pertinent negatives for diabetes include no blurred vision, no chest pain, no fatigue, no polydipsia, no polyphagia and no polyuria. There are no hypoglycemic complications. Risk factors for coronary artery disease include diabetes mellitus, dyslipidemia, hypertension, obesity, post-menopausal, sedentary lifestyle and stress. Her breakfast blood glucose is taken between 8-9 am. Her breakfast blood glucose range is generally 130-140 mg/dl. An ACE inhibitor/angiotensin II receptor blocker is being taken.  Hypertension This is a chronic problem. The current episode started more than 1 year ago. The problem has been gradually improving since onset. The problem is controlled. Pertinent negatives include no  blurred vision, chest pain or headaches.     Past Medical History:  Diagnosis Date  . Diabetes mellitus   . Dysrhythmia    1985  . Heart murmur   . HLD (hyperlipidemia)   . Hypertension   . Kidney stone on right side 03/2013  . Pneumonia    7 yrs ago     Family History  Problem Relation Age of Onset  . Heart disease Mother   . Kidney disease Father   . Esophageal cancer Maternal Uncle   . Colon cancer Cousin   . Stroke Neg Hx   . Cancer Neg Hx   . Colon polyps Neg Hx   . Rectal cancer Neg Hx   . Stomach cancer Neg Hx      Current Outpatient Medications:  .  amLODipine (NORVASC) 10 MG tablet, Take 1 tablet (10 mg total) by mouth daily., Disp: 90 tablet, Rfl: 1 .  Ascorbic Acid (VITAMIN C PO), Take by mouth., Disp: , Rfl:  .  gabapentin (NEURONTIN) 100 MG capsule, Take 1 capsule (100 mg total) by mouth 3 (three) times daily., Disp: 90 capsule, Rfl: 3 .  glucose blood (ONETOUCH VERIO) test strip, Use as directed to check blood sugars 2 times per day dx: e11.65, Disp: 100 each, Rfl: 10 .  metFORMIN (GLUCOPHAGE) 1000 MG tablet, TAKE ONE TABLET BY MOUTH EVERY MORNING AND TAKE ONE TABLET BY MOUTH EVERY EVENING WITH A MEAL, Disp: 90 tablet, Rfl: 1 .  OZEMPIC, 0.25 OR 0.5 MG/DOSE, 2 MG/1.5ML SOPN, DIAL AND INJECT UNDER THE SKIN 0.5 MG WEEKLY, Disp: 3 mL, Rfl: 3 .  pravastatin (PRAVACHOL) 40 MG tablet, TAKE ONE TABLET BY  MOUTH EVERY NIGHT AT BEDTIME, Disp: 90 tablet, Rfl: 1 .  telmisartan (MICARDIS) 20 MG tablet, TAKE ONE TABLET BY MOUTH DAILY, Disp: 90 tablet, Rfl: 1 .  TRESIBA FLEXTOUCH 200 UNIT/ML FlexTouch Pen, INJECT 44 UNITS INTO THE SKIN EVERY NIGHT AT BEDTIME - DO NOT EXCEED 100 UNITS, Disp: 3 mL, Rfl: 1 .  Zinc Sulfate (ZINCATE PO), Take by mouth., Disp: , Rfl:    Allergies  Allergen Reactions  . Lisinopril Cough  . Hydrochlorothiazide Other (See Comments)    pancreatitis      The patient states she uses status post hysterectomy for birth control. Last LMP was No LMP  recorded. Patient has had a hysterectomy.. Negative for Dysmenorrhea. Negative for: breast discharge, breast lump(s), breast pain and breast self exam. Associated symptoms include abnormal vaginal bleeding. Pertinent negatives include abnormal bleeding (hematology), anxiety, decreased libido, depression, difficulty falling sleep, dyspareunia, history of infertility, nocturia, sexual dysfunction, sleep disturbances, urinary incontinence, urinary urgency, vaginal discharge and vaginal itching. Diet regular.The patient states her exercise level is  intermittent.  . The patient's tobacco use is:  Social History   Tobacco Use  Smoking Status Former Smoker  . Packs/day: 0.50  . Years: 25.00  . Pack years: 12.50  . Quit date: 04/29/2004  . Years since quitting: 16.6  Smokeless Tobacco Never Used  . She has been exposed to passive smoke. The patient's alcohol use is:  Social History   Substance and Sexual Activity  Alcohol Use No   Review of Systems  Constitutional: Negative.  Negative for fatigue.  HENT: Negative.   Eyes: Negative.  Negative for blurred vision.  Respiratory: Negative.   Cardiovascular: Negative.  Negative for chest pain.  Gastrointestinal: Negative.   Endocrine: Negative.  Negative for polydipsia, polyphagia and polyuria.  Genitourinary: Negative.   Musculoskeletal: Negative.   Skin: Negative.   Allergic/Immunologic: Negative.   Neurological: Negative.  Negative for dizziness and headaches.  Hematological: Negative.   Psychiatric/Behavioral: Negative.      Today's Vitals   12/02/20 1109 12/02/20 1148  BP: (!) 158/92 (!) 142/88  Pulse: 80   Temp: 97.8 F (36.6 C)   TempSrc: Oral   Weight: 172 lb (78 kg)   Height: 5' 2.8" (1.595 m)    Body mass index is 30.66 kg/m.  Wt Readings from Last 3 Encounters:  12/02/20 172 lb (78 kg)  09/22/20 172 lb 3.2 oz (78.1 kg)  06/14/20 175 lb (79.4 kg)   Objective:  Physical Exam Vitals and nursing note reviewed.   Constitutional:      General: She is not in acute distress.    Appearance: Normal appearance. She is obese.  HENT:     Head: Normocephalic and atraumatic.     Right Ear: Tympanic membrane, ear canal and external ear normal. There is no impacted cerumen.     Left Ear: Tympanic membrane, ear canal and external ear normal. There is no impacted cerumen.     Nose: No congestion.     Comments: Masked     Mouth/Throat:     Comments: Masked  Eyes:     Extraocular Movements: Extraocular movements intact.     Conjunctiva/sclera: Conjunctivae normal.     Pupils: Pupils are equal, round, and reactive to light.  Cardiovascular:     Rate and Rhythm: Normal rate and regular rhythm.     Pulses: Normal pulses.          Dorsalis pedis pulses are 2+ on the right side and 2+ on  the left side.     Heart sounds: Normal heart sounds. No murmur heard.   Pulmonary:     Effort: Pulmonary effort is normal. No respiratory distress.     Breath sounds: Normal breath sounds. No wheezing.  Chest:  Breasts:     Tanner Score is 5.     Right: Normal.     Left: Normal.    Abdominal:     General: Bowel sounds are normal.     Palpations: Abdomen is soft.  Genitourinary:    Comments: deferred Musculoskeletal:     Cervical back: Normal range of motion.  Feet:     Right foot:     Protective Sensation: 5 sites tested. 5 sites sensed.     Skin integrity: Dry skin present.     Toenail Condition: Right toenails are normal.     Left foot:     Protective Sensation: 5 sites tested. 5 sites sensed.     Skin integrity: Dry skin present.     Toenail Condition: Left toenails are normal.  Skin:    General: Skin is warm and dry.     Capillary Refill: Capillary refill takes less than 2 seconds.  Neurological:     General: No focal deficit present.     Mental Status: She is alert and oriented to person, place, and time.     Cranial Nerves: No cranial nerve deficit.  Psychiatric:        Mood and Affect: Mood  normal.        Behavior: Behavior normal.        Thought Content: Thought content normal.        Judgment: Judgment normal.         Assessment And Plan:     1. Routine general medical examination at health care facility Comments: A full exam was performed. Importance of monthly self breast exams was discussed with the patient. PATIENT IS ADVISED TO GET 30-45 MINUTES REGULAR EXERCISE NO LESS THAN FOUR TO FIVE DAYS PER WEEK - BOTH WEIGHTBEARING EXERCISES AND AEROBIC ARE RECOMMENDED.  PATIENT IS ADVISED TO FOLLOW A HEALTHY DIET WITH AT LEAST SIX FRUITS/VEGGIES PER DAY, DECREASE INTAKE OF RED MEAT, AND TO INCREASE FISH INTAKE TO TWO DAYS PER WEEK.  MEATS/FISH SHOULD NOT BE FRIED, BAKED OR BROILED IS PREFERABLE.  IT IS ALSO IMPORTANT TO CUT BACK ON YOUR SUGAR INTAKE. PLEASE AVOID ANYTHING WITH ADDED SUGAR, CORN SYRUP OR OTHER SWEETENERS. IF YOU MUST USE A SWEETENER, YOU CAN TRY STEVIA. IT IS ALSO IMPORTANT TO AVOID ARTIFICIALLY SWEETENERS AND DIET BEVERAGES. LASTLY, I SUGGEST WEARING SPF 50 SUNSCREEN ON EXPOSED PARTS AND ESPECIALLY WHEN IN THE DIRECT SUNLIGHT FOR AN EXTENDED PERIOD OF TIME.  PLEASE AVOID FAST FOOD RESTAURANTS AND INCREASE YOUR WATER INTAKE.  - CMP14+EGFR - CBC - Hemoglobin A1c  2. Uncontrolled type 2 diabetes mellitus with hyperglycemia ([place]) Comments: Diabetic foot exam was performed. She will f/u in 3 months for re-evaluation.  I DISCUSSED WITH THE PATIENT AT LENGTH REGARDING THE GOALS OF GLYCEMIC CONTROL AND POSSIBLE LONG-TERM COMPLICATIONS.  I  ALSO STRESSED THE IMPORTANCE OF COMPLIANCE WITH HOME GLUCOSE MONITORING, DIETARY RESTRICTIONS INCLUDING AVOIDANCE OF SUGARY DRINKS/PROCESSED FOODS,  ALONG WITH REGULAR EXERCISE.  I  ALSO STRESSED THE IMPORTANCE OF ANNUAL EYE EXAMS, SELF FOOT CARE AND COMPLIANCE WITH OFFICE VISITS.'  3. Essential hypertension Comments: Chronic, uncontrolled. States she fussed alot on the highway. Repeat BP improved, but not at goal. Importance of dietary  and medication compliance was discussed  with the patient.   EKG performed, NSR w/o acute changes. Advised to follow low sodium diet. She agrees to rto in four weeks for a nurse visit.  - POCT Urinalysis Dipstick (81002) - POCT UA - Microalbumin - EKG 12-Lead  4. Class 1 obesity due to excess calories with serious comorbidity and body mass index (BMI) of 30.0 to 30.9 in adult Comments: She is encouraged to strive for BMI less than 27 to decrease cardiac risk. Advised to aim for at least 150 minutes of exercise per week.   Patient was given opportunity to ask questions. Patient verbalized understanding of the plan and was able to repeat key elements of the plan. All questions were answered to their satisfaction.   I, [name], MD, have reviewed all documentation for this visit. The documentation on 12/02/20 for the exam, diagnosis, procedures, and orders are all accurate and complete.  THE PATIENT IS ENCOURAGED TO PRACTICE SOCIAL DISTANCING DUE TO THE COVID-19 PANDEMIC.

## 2020-12-02 NOTE — Patient Instructions (Signed)
Health Maintenance, Female Adopting a healthy lifestyle and getting preventive care are important in promoting health and wellness. Ask your health care provider about:  The right schedule for you to have regular tests and exams.  Things you can do on your own to prevent diseases and keep yourself healthy. What should I know about diet, weight, and exercise? Eat a healthy diet  Eat a diet that includes plenty of vegetables, fruits, low-fat dairy products, and lean protein.  Do not eat a lot of foods that are high in solid fats, added sugars, or sodium.   Maintain a healthy weight Body mass index (BMI) is used to identify weight problems. It estimates body fat based on height and weight. Your health care provider can help determine your BMI and help you achieve or maintain a healthy weight. Get regular exercise Get regular exercise. This is one of the most important things you can do for your health. Most adults should:  Exercise for at least 150 minutes each week. The exercise should increase your heart rate and make you sweat (moderate-intensity exercise).  Do strengthening exercises at least twice a week. This is in addition to the moderate-intensity exercise.  Spend less time sitting. Even light physical activity can be beneficial. Watch cholesterol and blood lipids Have your blood tested for lipids and cholesterol at 60 years of age, then have this test every 5 years. Have your cholesterol levels checked more often if:  Your lipid or cholesterol levels are high.  You are older than 60 years of age.  You are at high risk for heart disease. What should I know about cancer screening? Depending on your health history and family history, you may need to have cancer screening at various ages. This may include screening for:  Breast cancer.  Cervical cancer.  Colorectal cancer.  Skin cancer.  Lung cancer. What should I know about heart disease, diabetes, and high blood  pressure? Blood pressure and heart disease  High blood pressure causes heart disease and increases the risk of stroke. This is more likely to develop in people who have high blood pressure readings, are of African descent, or are overweight.  Have your blood pressure checked: ? Every 3-5 years if you are 18-39 years of age. ? Every year if you are 40 years old or older. Diabetes Have regular diabetes screenings. This checks your fasting blood sugar level. Have the screening done:  Once every three years after age 40 if you are at a normal weight and have a low risk for diabetes.  More often and at a younger age if you are overweight or have a high risk for diabetes. What should I know about preventing infection? Hepatitis B If you have a higher risk for hepatitis B, you should be screened for this virus. Talk with your health care provider to find out if you are at risk for hepatitis B infection. Hepatitis C Testing is recommended for:  Everyone born from 1945 through 1965.  Anyone with known risk factors for hepatitis C. Sexually transmitted infections (STIs)  Get screened for STIs, including gonorrhea and chlamydia, if: ? You are sexually active and are younger than 60 years of age. ? You are older than 60 years of age and your health care provider tells you that you are at risk for this type of infection. ? Your sexual activity has changed since you were last screened, and you are at increased risk for chlamydia or gonorrhea. Ask your health care provider   if you are at risk.  Ask your health care provider about whether you are at high risk for HIV. Your health care provider may recommend a prescription medicine to help prevent HIV infection. If you choose to take medicine to prevent HIV, you should first get tested for HIV. You should then be tested every 3 months for as long as you are taking the medicine. Pregnancy  If you are about to stop having your period (premenopausal) and  you may become pregnant, seek counseling before you get pregnant.  Take 400 to 800 micrograms (mcg) of folic acid every day if you become pregnant.  Ask for birth control (contraception) if you want to prevent pregnancy. Osteoporosis and menopause Osteoporosis is a disease in which the bones lose minerals and strength with aging. This can result in bone fractures. If you are 65 years old or older, or if you are at risk for osteoporosis and fractures, ask your health care provider if you should:  Be screened for bone loss.  Take a calcium or vitamin D supplement to lower your risk of fractures.  Be given hormone replacement therapy (HRT) to treat symptoms of menopause. Follow these instructions at home: Lifestyle  Do not use any products that contain nicotine or tobacco, such as cigarettes, e-cigarettes, and chewing tobacco. If you need help quitting, ask your health care provider.  Do not use street drugs.  Do not share needles.  Ask your health care provider for help if you need support or information about quitting drugs. Alcohol use  Do not drink alcohol if: ? Your health care provider tells you not to drink. ? You are pregnant, may be pregnant, or are planning to become pregnant.  If you drink alcohol: ? Limit how much you use to 0-1 drink a day. ? Limit intake if you are breastfeeding.  Be aware of how much alcohol is in your drink. In the [place] one drink equals one 12 oz bottle of beer (355 mL), one 5 oz glass of wine (148 mL), or one 1 oz glass of hard liquor (44 mL). General instructions  Schedule regular health, dental, and eye exams.  Stay current with your vaccines.  Tell your health care provider if: ? You often feel depressed. ? You have ever been abused or do not feel safe at home. Summary  Adopting a healthy lifestyle and getting preventive care are important in promoting health and wellness.  Follow your health care provider's instructions about healthy  diet, exercising, and getting tested or screened for diseases.  Follow your health care provider's instructions on monitoring your cholesterol and blood pressure. This information is not intended to replace advice given to you by your health care provider. Make sure you discuss any questions you have with your health care provider. Document Revised: 09/04/2018 Document Reviewed: 09/04/2018 [employer] Patient Education  2021 [employer]  

## 2020-12-03 LAB — CMP14+EGFR
ALT: 15 IU/L (ref 0–32)
AST: 17 IU/L (ref 0–40)
Albumin/Globulin Ratio: 1.5 (ref 1.2–2.2)
Albumin: 4.5 g/dL (ref 3.8–4.9)
Alkaline Phosphatase: 186 IU/L — ABNORMAL HIGH (ref 44–121)
BUN/Creatinine Ratio: 12 (ref 9–23)
BUN: 12 mg/dL (ref 6–24)
Bilirubin Total: 0.4 mg/dL (ref 0.0–1.2)
CO2: 21 mmol/L (ref 20–29)
Calcium: 9.7 mg/dL (ref 8.7–10.2)
Chloride: 106 mmol/L (ref 96–106)
Creatinine, Ser: 1 mg/dL (ref 0.57–1.00)
Globulin, Total: 3.1 g/dL (ref 1.5–4.5)
Glucose: 113 mg/dL — ABNORMAL HIGH (ref 65–99)
Potassium: 4.1 mmol/L (ref 3.5–5.2)
Sodium: 142 mmol/L (ref 134–144)
Total Protein: 7.6 g/dL (ref 6.0–8.5)
eGFR: 65 mL/min/{1.73_m2} (ref >59)

## 2020-12-03 LAB — CBC
Hematocrit: 38.1 % (ref 34.0–46.6)
Hemoglobin: 12.3 g/dL (ref 11.1–15.9)
MCH: 26.1 pg — ABNORMAL LOW (ref 26.6–33.0)
MCHC: 32.3 g/dL (ref 31.5–35.7)
MCV: 81 fL (ref 79–97)
Platelets: 273 10*3/uL (ref 150–450)
RBC: 4.72 x10E6/uL (ref 3.77–5.28)
RDW: 13.7 % (ref 11.7–15.4)
WBC: 5.9 10*3/uL (ref 3.4–10.8)

## 2020-12-03 LAB — HEMOGLOBIN A1C
Est. average glucose Bld gHb Est-mCnc: 183 mg/dL
Hgb A1c MFr Bld: 8 % — ABNORMAL HIGH (ref 4.8–5.6)

## 2020-12-22 ENCOUNTER — Other Ambulatory Visit: Payer: Self-pay | Admitting: Internal Medicine

## 2021-02-05 ENCOUNTER — Other Ambulatory Visit: Payer: Self-pay | Admitting: Internal Medicine

## 2021-02-28 ENCOUNTER — Encounter: Payer: Self-pay | Admitting: Internal Medicine

## 2021-03-08 ENCOUNTER — Ambulatory Visit: Payer: BC Managed Care – PPO | Admitting: Internal Medicine

## 2021-03-14 ENCOUNTER — Encounter: Payer: Self-pay | Admitting: Internal Medicine

## 2021-03-14 ENCOUNTER — Ambulatory Visit: Payer: BC Managed Care – PPO | Admitting: Internal Medicine

## 2021-03-14 ENCOUNTER — Other Ambulatory Visit: Payer: Self-pay

## 2021-03-14 VITALS — BP 144/70 | HR 77 | Temp 98.3°F | Ht 60.8 in | Wt 175.0 lb

## 2021-03-14 DIAGNOSIS — Z6833 Body mass index (BMI) 33.0-33.9, adult: Secondary | ICD-10-CM

## 2021-03-14 DIAGNOSIS — I10 Essential (primary) hypertension: Secondary | ICD-10-CM

## 2021-03-14 DIAGNOSIS — E6609 Other obesity due to excess calories: Secondary | ICD-10-CM

## 2021-03-14 DIAGNOSIS — E559 Vitamin D deficiency, unspecified: Secondary | ICD-10-CM

## 2021-03-14 DIAGNOSIS — M533 Sacrococcygeal disorders, not elsewhere classified: Secondary | ICD-10-CM

## 2021-03-14 DIAGNOSIS — G8929 Other chronic pain: Secondary | ICD-10-CM

## 2021-03-14 DIAGNOSIS — W11XXXA Fall on and from ladder, initial encounter: Secondary | ICD-10-CM | POA: Diagnosis not present

## 2021-03-14 DIAGNOSIS — E1165 Type 2 diabetes mellitus with hyperglycemia: Secondary | ICD-10-CM

## 2021-03-14 DIAGNOSIS — M545 Low back pain, unspecified: Secondary | ICD-10-CM

## 2021-03-14 NOTE — Progress Notes (Signed)
I,Tianna Badgett,acting as a Education administrator for [name], MD.,have documented all relevant documentation on the behalf of [name], MD,as directed by  [name], MD while in the presence of [name], MD.  This visit occurred during the SARS-CoV-2 public health emergency.  Safety protocols were in place, including screening questions prior to the visit, additional usage of staff PPE, and extensive cleaning of exam room while observing appropriate contact time as indicated for disinfecting solutions.  Subjective:     Patient ID: Teresa Grant , female    DOB: 1961/06/02 , 60 y.o.   MRN: [id number]   Chief Complaint  Patient presents with   Diabetes   Hypertension    HPI  She is here today for a diabetes check.  She states that she is taking medications as prescribed. She admits she has not been exercising regularly. Reports having some tailbone pain.   Diabetes She presents for her follow-up diabetic visit. She has type 2 diabetes mellitus. Her disease course has been stable. There are no hypoglycemic associated symptoms. Pertinent negatives for hypoglycemia include no headaches. Pertinent negatives for diabetes include no blurred vision, no polydipsia, no polyphagia and no polyuria. There are no hypoglycemic complications. Risk factors for coronary artery disease include diabetes mellitus, dyslipidemia, hypertension, obesity, post-menopausal, sedentary lifestyle and stress. She is following a diabetic diet. She participates in exercise intermittently. Her breakfast blood glucose is taken between 8-9 am. Her breakfast blood glucose range is generally 130-140 mg/dl. An ACE inhibitor/angiotensin II receptor blocker is being taken. Eye exam is current.  Hypertension This is a chronic problem. The current episode started more than 1 year ago. The problem has been gradually improving since onset. The problem is controlled. Pertinent negatives include no blurred vision, headaches  or palpitations. The current treatment provides moderate improvement. Compliance problems include exercise.     Past Medical History:  Diagnosis Date   Diabetes mellitus    Dysrhythmia    1985   Heart murmur    HLD (hyperlipidemia)    Hypertension    Kidney stone on right side 03/2013   Pneumonia    7 yrs ago     Family History  Problem Relation Age of Onset   Heart disease Mother    Kidney disease Father    Esophageal cancer Maternal Uncle    Colon cancer Cousin    Stroke Neg Hx    Cancer Neg Hx    Colon polyps Neg Hx    Rectal cancer Neg Hx    Stomach cancer Neg Hx      Current Outpatient Medications:    amLODipine (NORVASC) 10 MG tablet, Take 1 tablet (10 mg total) by mouth daily., Disp: 90 tablet, Rfl: 1   Ascorbic Acid (VITAMIN C PO), Take by mouth., Disp: , Rfl:    gabapentin (NEURONTIN) 100 MG capsule, TAKE ONE CAPSULE BY MOUTH THREE TIMES A DAY, Disp: 270 capsule, Rfl: 1   glucose blood (ONETOUCH VERIO) test strip, Use as directed to check blood sugars 2 times per day dx: e11.65, Disp: 100 each, Rfl: 10   metFORMIN (GLUCOPHAGE) 1000 MG tablet, TAKE ONE TABLET BY MOUTH EVERY MORNING AND TAKE ONE TABLET BY MOUTH EVERY EVENING WITH A MEAL, Disp: 90 tablet, Rfl: 1   OZEMPIC, 0.25 OR 0.5 MG/DOSE, 2 MG/1.5ML SOPN, DIAL AND INJECT UNDER THE SKIN 0.5 MG WEEKLY, Disp: 3 mL, Rfl: 3   pravastatin (PRAVACHOL) 40 MG tablet, TAKE ONE TABLET BY MOUTH EVERY NIGHT  AT BEDTIME, Disp: 90 tablet, Rfl: 1   telmisartan (MICARDIS) 20 MG tablet, TAKE ONE TABLET BY MOUTH DAILY, Disp: 90 tablet, Rfl: 1   TRESIBA FLEXTOUCH 200 UNIT/ML FlexTouch Pen, INJECT 44 UNITS UNDER THE SKIN EVERY NIGHT AT BEDTIME **DO NOT EXCEED 100 UNITS**, Disp: 9 mL, Rfl: 1   Zinc Sulfate (ZINCATE PO), Take by mouth., Disp: , Rfl:    Allergies  Allergen Reactions   Lisinopril Cough   Hydrochlorothiazide Other (See Comments)    pancreatitis     Review of Systems  Constitutional: Negative.   Eyes:  Negative for  blurred vision.  Respiratory: Negative.    Cardiovascular: Negative.  Negative for palpitations.  Gastrointestinal: Negative.   Endocrine: Negative for polydipsia, polyphagia and polyuria.  Musculoskeletal:  Positive for back pain.       She c/o tailbone pain. Adds she fell off of a ladder back in end of September. She was standing on a ladder trying to hang flags. She states she was coming down the ladder backwards, she missed a step and fell off a stepladder. She fell onto her right butt cheek. She reports her friend helped her up, she immediately had tingling down her right leg. She admits that she did not seek medical attention.   Neurological: Negative.  Negative for headaches.    Today's Vitals   03/14/21 1531  BP: (!) 144/70  Pulse: 77  Temp: 98.3 F (36.8 C)  TempSrc: Oral  Weight: 175 lb (79.4 kg)  Height: 5' 0.8" (1.544 m)   Body mass index is 33.28 kg/m.  Wt Readings from Last 3 Encounters:  03/14/21 175 lb (79.4 kg)  12/02/20 172 lb (78 kg)  09/22/20 172 lb 3.2 oz (78.1 kg)    Objective:  Physical Exam Vitals and nursing note reviewed.  Constitutional:      Appearance: Normal appearance.  HENT:     Head: Normocephalic and atraumatic.     Nose:     Comments: Masked     Mouth/Throat:     Comments: Masked  Cardiovascular:     Rate and Rhythm: Normal rate and regular rhythm.     Heart sounds: Normal heart sounds.  Pulmonary:     Effort: Pulmonary effort is normal.     Breath sounds: Normal breath sounds.  Musculoskeletal:     Comments: No spinal tenderness There is tenderness at gluteal cleft - no overlying erythema  Skin:    General: Skin is warm.  Neurological:     General: No focal deficit present.     Mental Status: She is alert.  Psychiatric:        Mood and Affect: Mood normal.        Behavior: Behavior normal.        Assessment And Plan:     1. Uncontrolled type 2 diabetes mellitus with hyperglycemia ([facility]) Comments: Chronic, I will check  labs as listed below. I will adjust meds as needed. Importance of dietary compliance was d/w patient.  - Hemoglobin A1c - BMP8+EGFR  2. Essential hypertension Comments: Chronic, fair control. I will not make any changes today. Advised to follow low sodium diet.   3. Fall from ladder, initial encounter Comments: Unfortunately, she does not recall exact date, only recalls end of Sept. 2021.   4. Traumatic coccydynia Comments: I will check coccyx/sacrum x-rays.  - DG Sacrum/Coccyx; Future  5. Chronic midline low back pain without sciatica Comments: She is s/p fall in Aug/Sept. I will check lumbar spine x-rays.  -  DG Lumbar Spine Complete; Future  6. Vitamin D deficiency Comments: I will check a vitamin D level and supplement as needed.  - Vitamin D (25 hydroxy)  7. Class 1 obesity due to excess calories with serious comorbidity and body mass index (BMI) of 33.0 to 33.9 in adult She is encouraged to strive for BMI less than 30 to decrease cardiac risk. Advised to aim for at least 150 minutes of exercise per week.   Patient was given opportunity to ask questions. Patient verbalized understanding of the plan and was able to repeat key elements of the plan. All questions were answered to their satisfaction.  [name], MD   I, [name], MD, have reviewed all documentation for this visit. The documentation on 03/14/21 for the exam, diagnosis, procedures, and orders are all accurate and complete.   IF YOU HAVE BEEN REFERRED TO A SPECIALIST, IT MAY TAKE 1-2 WEEKS TO SCHEDULE/PROCESS THE REFERRAL. IF YOU HAVE NOT HEARD FROM US/SPECIALIST IN TWO WEEKS, PLEASE GIVE [place] A CALL AT [phone].   THE PATIENT IS ENCOURAGED TO PRACTICE SOCIAL DISTANCING DUE TO THE COVID-19 PANDEMIC.

## 2021-03-14 NOTE — Patient Instructions (Signed)

## 2021-03-15 LAB — VITAMIN D 25 HYDROXY (VIT D DEFICIENCY, FRACTURES): Vit D, 25-Hydroxy: 25.4 ng/mL — ABNORMAL LOW (ref 30.0–100.0)

## 2021-03-15 LAB — BMP8+EGFR
BUN/Creatinine Ratio: 10 (ref 9–23)
BUN: 11 mg/dL (ref 6–24)
CO2: 26 mmol/L (ref 20–29)
Calcium: 10 mg/dL (ref 8.7–10.2)
Chloride: 99 mmol/L (ref 96–106)
Creatinine, Ser: 1.1 mg/dL — ABNORMAL HIGH (ref 0.57–1.00)
Glucose: 347 mg/dL — ABNORMAL HIGH (ref 65–99)
Potassium: 4.4 mmol/L (ref 3.5–5.2)
Sodium: 138 mmol/L (ref 134–144)
eGFR: 58 mL/min/{1.73_m2} — ABNORMAL LOW (ref >59)

## 2021-03-15 LAB — HEMOGLOBIN A1C
Est. average glucose Bld gHb Est-mCnc: 275 mg/dL
Hgb A1c MFr Bld: 11.2 % — ABNORMAL HIGH (ref 4.8–5.6)

## 2021-03-15 MED ORDER — VITAMIN D (ERGOCALCIFEROL) 1.25 MG (50000 UNIT) PO CAPS: 50000.0000 [IU] | ORAL_CAPSULE | ORAL | 1 refills | Status: DC

## 2021-03-16 ENCOUNTER — Other Ambulatory Visit: Payer: Self-pay | Admitting: Internal Medicine

## 2021-03-18 ENCOUNTER — Other Ambulatory Visit: Payer: Self-pay

## 2021-03-18 DIAGNOSIS — E1165 Type 2 diabetes mellitus with hyperglycemia: Secondary | ICD-10-CM

## 2021-04-11 ENCOUNTER — Other Ambulatory Visit: Payer: Self-pay

## 2021-04-11 ENCOUNTER — Encounter: Payer: Self-pay | Admitting: Internal Medicine

## 2021-04-11 ENCOUNTER — Ambulatory Visit (INDEPENDENT_AMBULATORY_CARE_PROVIDER_SITE_OTHER): Payer: BC Managed Care – PPO | Admitting: Internal Medicine

## 2021-04-11 VITALS — BP 112/64 | HR 83 | Temp 98.7°F | Ht 60.8 in | Wt 174.6 lb

## 2021-04-11 DIAGNOSIS — Z79899 Other long term (current) drug therapy: Secondary | ICD-10-CM

## 2021-04-11 DIAGNOSIS — E1165 Type 2 diabetes mellitus with hyperglycemia: Secondary | ICD-10-CM | POA: Diagnosis not present

## 2021-04-11 MED ORDER — DAPAGLIFLOZIN PROPANEDIOL 10 MG PO TABS: 10.0000 mg | ORAL_TABLET | Freq: Every day | ORAL | 5 refills | Status: DC

## 2021-04-11 NOTE — Patient Instructions (Signed)

## 2021-04-11 NOTE — Progress Notes (Signed)
I,Katawbba Wiggins,acting as a Education administrator for [name], MD.,have documented all relevant documentation on the behalf of [name], MD,as directed by  [name], MD while in the presence of [name], MD.  This visit occurred during the SARS-CoV-2 public health emergency.  Safety protocols were in place, including screening questions prior to the visit, additional usage of staff PPE, and extensive cleaning of exam room while observing appropriate contact time as indicated for disinfecting solutions.  Subjective:     Patient ID: Teresa Grant , female    DOB: Jan 29, 1961 , 60 y.o.   MRN: [id number]   Chief Complaint  Patient presents with   Diabetes    HPI  She is here today for a diabetes check.  Unfortunately, she did not start the [place]. She admits that she did not come by to get samples.   Diabetes She presents for her follow-up diabetic visit. She has type 2 diabetes mellitus. Her disease course has been stable. There are no hypoglycemic associated symptoms. Pertinent negatives for diabetes include no polydipsia, no polyphagia and no polyuria. There are no hypoglycemic complications. Risk factors for coronary artery disease include diabetes mellitus, dyslipidemia, hypertension, obesity, post-menopausal, sedentary lifestyle and stress. She is following a diabetic diet. She participates in exercise intermittently. Her breakfast blood glucose is taken between 8-9 am. Her breakfast blood glucose range is generally 130-140 mg/dl. An ACE inhibitor/angiotensin II receptor blocker is being taken. Eye exam is current.    Past Medical History:  Diagnosis Date   Diabetes mellitus    Dysrhythmia    1985   Heart murmur    HLD (hyperlipidemia)    Hypertension    Kidney stone on right side 03/2013   Pneumonia    7 yrs ago     Family History  Problem Relation Age of Onset   Heart disease Mother    Kidney disease Father    Esophageal cancer Maternal Uncle    Colon  cancer Cousin    Stroke Neg Hx    Cancer Neg Hx    Colon polyps Neg Hx    Rectal cancer Neg Hx    Stomach cancer Neg Hx      Current Outpatient Medications:    amLODipine (NORVASC) 10 MG tablet, Take 1 tablet (10 mg total) by mouth daily., Disp: 90 tablet, Rfl: 1   Ascorbic Acid (VITAMIN C PO), Take by mouth., Disp: , Rfl:    dapagliflozin propanediol (FARXIGA) 10 MG TABS tablet, Take 1 tablet (10 mg total) by mouth daily before breakfast., Disp: 30 tablet, Rfl: 5   gabapentin (NEURONTIN) 100 MG capsule, TAKE ONE CAPSULE BY MOUTH THREE TIMES A DAY, Disp: 270 capsule, Rfl: 1   glucose blood (ONETOUCH VERIO) test strip, Use as directed to check blood sugars 2 times per day dx: e11.65, Disp: 100 each, Rfl: 10   metFORMIN (GLUCOPHAGE) 1000 MG tablet, TAKE ONE TABLET BY MOUTH EVERY MORNING AND TAKE ONE TABLET BY MOUTH EVERY EVENING WITH A MEAL, Disp: 90 tablet, Rfl: 1   OZEMPIC, 0.25 OR 0.5 MG/DOSE, 2 MG/1.5ML SOPN, DIAL AND INJECT UNDER THE SKIN 0.5 MG WEEKLY, Disp: 3 mL, Rfl: 3   pravastatin (PRAVACHOL) 40 MG tablet, TAKE ONE TABLET BY MOUTH EVERY NIGHT AT BEDTIME, Disp: 90 tablet, Rfl: 1   telmisartan (MICARDIS) 20 MG tablet, TAKE ONE TABLET BY MOUTH DAILY, Disp: 90 tablet, Rfl: 1   TRESIBA FLEXTOUCH 200 UNIT/ML FlexTouch Pen, INJECT 44 UNITS UNDER THE SKIN EVERY  NIGHT AT BEDTIME - DO NOT EXCEED 100 UNITS, Disp: 9 mL, Rfl: 1   Vitamin D, Ergocalciferol, (DRISDOL) 1.25 MG (50000 UNIT) CAPS capsule, Take 1 capsule (50,000 Units total) by mouth every 7 (seven) days., Disp: 12 capsule, Rfl: 1   Zinc Sulfate (ZINCATE PO), Take by mouth., Disp: , Rfl:    Allergies  Allergen Reactions   Lisinopril Cough   Hydrochlorothiazide Other (See Comments)    pancreatitis     Review of Systems  Constitutional: Negative.   Respiratory: Negative.    Cardiovascular: Negative.   Gastrointestinal: Negative.   Endocrine: Negative for polydipsia, polyphagia and polyuria.  Psychiatric/Behavioral: Negative.     All other systems reviewed and are negative.   Today's Vitals   04/11/21 1409  BP: 112/64  Pulse: 83  Temp: 98.7 F (37.1 C)  TempSrc: Oral  Weight: 174 lb 9.6 oz (79.2 kg)  Height: 5' 0.8" (1.544 m)   Body mass index is 33.21 kg/m.  Wt Readings from Last 3 Encounters:  04/11/21 174 lb 9.6 oz (79.2 kg)  03/14/21 175 lb (79.4 kg)  12/02/20 172 lb (78 kg)    BP Readings from Last 3 Encounters:  04/11/21 112/64  03/14/21 (!) 144/70  12/02/20 (!) 142/88    Objective:  Physical Exam Vitals and nursing note reviewed.  Constitutional:      Appearance: Normal appearance.  HENT:     Head: Normocephalic and atraumatic.     Nose:     Comments: Masked     Mouth/Throat:     Comments: Masked  Cardiovascular:     Rate and Rhythm: Normal rate and regular rhythm.     Heart sounds: Normal heart sounds.  Pulmonary:     Effort: Pulmonary effort is normal.     Breath sounds: Normal breath sounds.  Musculoskeletal:     Cervical back: Normal range of motion.  Skin:    General: Skin is warm.  Neurological:     General: No focal deficit present.     Mental Status: She is alert.  Psychiatric:        Mood and Affect: Mood normal.        Behavior: Behavior normal.        Assessment And Plan:     1. Uncontrolled type 2 diabetes mellitus with hyperglycemia ([place]) Comments: She was given samples of Farxiga 10mg  to take once daily. She agrees to f/u in 4 weeks. Advised to stay well hydrated and reminded of risk of UTI/yeast inf.   2. Drug therapy  I will check renal function at her next visit.   Patient was given opportunity to ask questions. Patient verbalized understanding of the plan and was able to repeat key elements of the plan. All questions were answered to their satisfaction.   I, [name], MD, have reviewed all documentation for this visit. The documentation on 04/11/21 for the exam, diagnosis, procedures, and orders are all accurate and complete.   IF YOU HAVE  BEEN REFERRED TO A SPECIALIST, IT MAY TAKE 1-2 WEEKS TO SCHEDULE/PROCESS THE REFERRAL. IF YOU HAVE NOT HEARD FROM US/SPECIALIST IN TWO WEEKS, PLEASE GIVE [place] A CALL AT [phone].   THE PATIENT IS ENCOURAGED TO PRACTICE SOCIAL DISTANCING DUE TO THE COVID-19 PANDEMIC.

## 2021-05-12 ENCOUNTER — Other Ambulatory Visit: Payer: Self-pay

## 2021-05-12 ENCOUNTER — Ambulatory Visit (INDEPENDENT_AMBULATORY_CARE_PROVIDER_SITE_OTHER): Payer: BC Managed Care – PPO | Admitting: Internal Medicine

## 2021-05-12 ENCOUNTER — Ambulatory Visit
Admission: RE | Admit: 2021-05-12 | Discharge: 2021-05-12 | Disposition: A | Discharge status: Home / Self Care | Payer: BC Managed Care – PPO | Source: Ambulatory Visit | Attending: Internal Medicine | Admitting: Internal Medicine

## 2021-05-12 ENCOUNTER — Encounter: Payer: Self-pay | Admitting: Internal Medicine

## 2021-05-12 VITALS — BP 132/78 | HR 84 | Temp 97.6°F | Ht 61.8 in | Wt 173.2 lb

## 2021-05-12 DIAGNOSIS — Z23 Encounter for immunization: Secondary | ICD-10-CM | POA: Diagnosis not present

## 2021-05-12 DIAGNOSIS — M545 Low back pain, unspecified: Secondary | ICD-10-CM

## 2021-05-12 DIAGNOSIS — M533 Sacrococcygeal disorders, not elsewhere classified: Secondary | ICD-10-CM

## 2021-05-12 DIAGNOSIS — G8929 Other chronic pain: Secondary | ICD-10-CM

## 2021-05-12 DIAGNOSIS — E6609 Other obesity due to excess calories: Secondary | ICD-10-CM

## 2021-05-12 DIAGNOSIS — E1165 Type 2 diabetes mellitus with hyperglycemia: Secondary | ICD-10-CM | POA: Diagnosis not present

## 2021-05-12 DIAGNOSIS — Z6831 Body mass index (BMI) 31.0-31.9, adult: Secondary | ICD-10-CM

## 2021-05-12 DIAGNOSIS — I10 Essential (primary) hypertension: Secondary | ICD-10-CM

## 2021-05-12 MED ORDER — XIGDUO XR 5-1000 MG PO TB24: 2.0000 | ORAL_TABLET | Freq: Every day | ORAL | 5 refills | Status: DC

## 2021-05-12 NOTE — Progress Notes (Signed)
I,Tianna Badgett,acting as a Education administrator for [name], MD.,have documented all relevant documentation on the behalf of [name], MD,as directed by  [name], MD while in the presence of [name], MD.  This visit occurred during the SARS-CoV-2 public health emergency.  Safety protocols were in place, including screening questions prior to the visit, additional usage of staff PPE, and extensive cleaning of exam room while observing appropriate contact time as indicated for disinfecting solutions.  Subjective:     Patient ID: Teresa Grant , female    DOB: 14-Aug-1961 , 60 y.o.   MRN: [id number]   Chief Complaint  Patient presents with   Diabetes    HPI  She presents today for diabetes check.  She was started on Farxiga 23m at her last visit. She has not had any issues with the medication.   Diabetes She presents for her follow-up diabetic visit. She has type 2 diabetes mellitus. Her disease course has been stable. There are no hypoglycemic associated symptoms. Pertinent negatives for hypoglycemia include no headaches. Pertinent negatives for diabetes include no blurred vision, no polydipsia, no polyphagia and no polyuria. There are no hypoglycemic complications. Risk factors for coronary artery disease include diabetes mellitus, dyslipidemia, hypertension, obesity, post-menopausal, sedentary lifestyle and stress. She is following a diabetic diet. She participates in exercise intermittently. Her breakfast blood glucose is taken between 8-9 am. Her breakfast blood glucose range is generally 130-140 mg/dl. An ACE inhibitor/angiotensin II receptor blocker is being taken. Eye exam is current.  Hypertension This is a chronic problem. The current episode started more than 1 year ago. The problem has been gradually improving since onset. The problem is controlled. Pertinent negatives include no blurred vision, headaches or palpitations. The current treatment provides moderate  improvement. Compliance problems include exercise.     Past Medical History:  Diagnosis Date   Diabetes mellitus    Dysrhythmia    1985   Heart murmur    HLD (hyperlipidemia)    Hypertension    Kidney stone on right side 03/2013   Pneumonia    7 yrs ago     Family History  Problem Relation Age of Onset   Heart disease Mother    Kidney disease Father    Esophageal cancer Maternal Uncle    Colon cancer Cousin    Stroke Neg Hx    Cancer Neg Hx    Colon polyps Neg Hx    Rectal cancer Neg Hx    Stomach cancer Neg Hx      Current Outpatient Medications:    amLODipine (NORVASC) 10 MG tablet, Take 1 tablet (10 mg total) by mouth daily., Disp: 90 tablet, Rfl: 1   Ascorbic Acid (VITAMIN C PO), Take by mouth., Disp: , Rfl:    Dapagliflozin-metFORMIN HCl ER (XIGDUO XR) 01-999 MG TB24, Take 2 tablets by mouth daily., Disp: 60 tablet, Rfl: 5   gabapentin (NEURONTIN) 100 MG capsule, TAKE ONE CAPSULE BY MOUTH THREE TIMES A DAY, Disp: 270 capsule, Rfl: 1   glucose blood (ONETOUCH VERIO) test strip, Use as directed to check blood sugars 2 times per day dx: e11.65, Disp: 100 each, Rfl: 10   OZEMPIC, 0.25 OR 0.5 MG/DOSE, 2 MG/1.5ML SOPN, DIAL AND INJECT UNDER THE SKIN 0.5 MG WEEKLY, Disp: 3 mL, Rfl: 3   pravastatin (PRAVACHOL) 40 MG tablet, TAKE ONE TABLET BY MOUTH EVERY NIGHT AT BEDTIME, Disp: 90 tablet, Rfl: 1   telmisartan (MICARDIS) 20 MG tablet, TAKE ONE TABLET  BY MOUTH DAILY, Disp: 90 tablet, Rfl: 1   TRESIBA FLEXTOUCH 200 UNIT/ML FlexTouch Pen, INJECT 44 UNITS UNDER THE SKIN EVERY NIGHT AT BEDTIME - DO NOT EXCEED 100 UNITS, Disp: 9 mL, Rfl: 1   Vitamin D, Ergocalciferol, (DRISDOL) 1.25 MG (50000 UNIT) CAPS capsule, Take 1 capsule (50,000 Units total) by mouth every 7 (seven) days., Disp: 12 capsule, Rfl: 1   Zinc Sulfate (ZINCATE PO), Take by mouth., Disp: , Rfl:    Allergies  Allergen Reactions   Lisinopril Cough   Hydrochlorothiazide Other (See Comments)    pancreatitis      Review of Systems  Constitutional: Negative.   Eyes:  Negative for blurred vision.  Respiratory: Negative.    Cardiovascular: Negative.  Negative for palpitations.  Gastrointestinal: Negative.   Endocrine: Negative for polydipsia, polyphagia and polyuria.  Musculoskeletal:  Positive for back pain.  Neurological: Negative.  Negative for headaches.  Psychiatric/Behavioral: Negative.      Today's Vitals   05/12/21 1409  BP: 132/78  Pulse: 84  Temp: 97.6 F (36.4 C)  TempSrc: Oral  Weight: 173 lb 3.2 oz (78.6 kg)  Height: 5' 1.8" (1.57 m)   Body mass index is 31.88 kg/m.  Wt Readings from Last 3 Encounters:  05/12/21 173 lb 3.2 oz (78.6 kg)  04/11/21 174 lb 9.6 oz (79.2 kg)  03/14/21 175 lb (79.4 kg)    Objective:  Physical Exam Vitals and nursing note reviewed.  Constitutional:      Appearance: Normal appearance.  HENT:     Head: Normocephalic and atraumatic.     Nose:     Comments: Masked     Mouth/Throat:     Comments: Masked  Eyes:     Extraocular Movements: Extraocular movements intact.  Cardiovascular:     Rate and Rhythm: Normal rate and regular rhythm.     Heart sounds: Normal heart sounds.  Pulmonary:     Effort: Pulmonary effort is normal.     Breath sounds: Normal breath sounds.  Musculoskeletal:     Cervical back: Normal range of motion.  Skin:    General: Skin is warm.  Neurological:     General: No focal deficit present.     Mental Status: She is alert.  Psychiatric:        Mood and Affect: Mood normal.        Behavior: Behavior normal.        Assessment And Plan:     1. Uncontrolled type 2 diabetes mellitus with hyperglycemia ([place]) Comments: I will d/c [place] and start 5/109m twice daily Xigduo. Importance of dietary compliance was discussed with the patient.  - Hemoglobin A1c - BMP8+eGFR  2. Essential hypertension Comments: Chronic, controlled. She will c/w current meds. She is encouraged to follow low sodium diet.   3.  Traumatic coccydynia Comments: She has persistent pain. She has yet to get x-rays. She states she lost the requisition form. She plans to go today after her visit.  4. Class 1 obesity due to excess calories with serious comorbidity and body mass index (BMI) of 31.0 to 31.9 in adult Comments: She is encouraged to strive for BMI less than 29 to decrease cardiac risk. Advised to aim for at least 150 minutes of exercise per week.  5. Immunization due Comments: She was given Pneumovax-23 to update her immunization history.  She verbally acknowledges that she will stop metformin and FIran The XMerleen Nicelywill replace both medications.   Patient was given opportunity to ask questions.  Patient verbalized understanding of the plan and was able to repeat key elements of the plan. All questions were answered to their satisfaction.   I, [name], MD, have reviewed all documentation for this visit. The documentation on 05/21/21 for the exam, diagnosis, procedures, and orders are all accurate and complete.   IF YOU HAVE BEEN REFERRED TO A SPECIALIST, IT MAY TAKE 1-2 WEEKS TO SCHEDULE/PROCESS THE REFERRAL. IF YOU HAVE NOT HEARD FROM US/SPECIALIST IN TWO WEEKS, PLEASE GIVE [place] A CALL AT [phone].   THE PATIENT IS ENCOURAGED TO PRACTICE SOCIAL DISTANCING DUE TO THE COVID-19 PANDEMIC.

## 2021-05-12 NOTE — Patient Instructions (Addendum)
She verbally acknowledges that she will stop metformin and [place]. The Merleen Nicely will replace both medications.   Diabetes Mellitus and Exercise Exercising regularly is important for overall health, especially for people who have diabetes mellitus. Exercising is not only about losing weight. It has many other health benefits, such as increasing muscle strength and bone density and reducing body fat and stress. This leads to improved fitness, flexibility, andendurance, all of which result in better overall health. What are the benefits of exercise if I have diabetes? Exercise has many benefits for people with diabetes. They include: Helping to lower and control blood sugar (glucose). Helping the body to respond better to the hormone insulin by improving insulin sensitivity. Reducing how much insulin the body needs. Lowering the risk for heart disease by: Lowering "bad" cholesterol and triglyceride levels. Increasing "good" cholesterol levels. Lowering blood pressure. Lowering blood glucose levels. What is my activity plan? Your health care provider or certified diabetes educator can help you make a plan for the type and frequency of exercise that works for you. This is called your activity plan. Be sure to: Get at least 150 minutes of medium-intensity or high-intensity exercise each week. Exercises may include brisk walking, biking, or water aerobics. Do stretching and strengthening exercises, such as yoga or weight lifting, at least 2 times a week. Spread out your activity over at least 3 days of the week. Get some form of physical activity each day. Do not go more than 2 days in a row without some kind of physical activity. Avoid being inactive for more than 90 minutes at a time. Take frequent breaks to walk or stretch. Choose exercises or activities that you enjoy. Set realistic goals. Start slowly and gradually increase your exercise intensity over time. How do I manage my diabetes during  exercise?  Monitor your blood glucose Check your blood glucose before and after exercising. If your blood glucose is: 240 mg/dL (13.3 mmol/L) or higher before you exercise, check your urine for ketones. These are chemicals created by the liver. If you have ketones in your urine, do not exercise until your blood glucose returns to normal. 100 mg/dL (5.6 mmol/L) or lower, eat a snack containing 15-20 grams of carbohydrate. Check your blood glucose 15 minutes after the snack to make sure that your glucose level is above 100 mg/dL (5.6 mmol/L) before you start your exercise. Know the symptoms of low blood glucose (hypoglycemia) and how to treat it. Your risk for hypoglycemia increases during and after exercise. Follow these tips and your health care provider's instructions Keep a carbohydrate snack that is fast-acting for use before, during, and after exercise to help prevent or treat hypoglycemia. Avoid injecting insulin into areas of the body that are going to be exercised. For example, avoid injecting insulin into: Your arms, when you are about to play tennis. Your legs, when you are about to go jogging. Keep records of your exercise habits. Doing this can help you and your health care provider adjust your diabetes management plan as needed. Write down: Food that you eat before and after you exercise. Blood glucose levels before and after you exercise. The type and amount of exercise you have done. Work with your health care provider when you start a new exercise or activity. He or she may need to: Make sure that the activity is safe for you. Adjust your insulin, other medicines, and food that you eat. Drink plenty of water while you exercise. This prevents loss of water (dehydration)  and problems caused by a lot of heat in the body (heat stroke). Where to find more information American Diabetes Association: www.diabetes.org Summary Exercising regularly is important for overall health,  especially for people who have diabetes mellitus. Exercising has many health benefits. It increases muscle strength and bone density and reduces body fat and stress. It also lowers and controls blood glucose. Your health care provider or certified diabetes educator can help you make an activity plan for the type and frequency of exercise that works for you. Work with your health care provider to make sure any new activity is safe for you. Also work with your health care provider to adjust your insulin, other medicines, and the food you eat. This information is not intended to replace advice given to you by your health care provider. Make sure you discuss any questions you have with your healthcare provider. Document Revised: 06/09/2019 Document Reviewed: 06/09/2019 Elsevier Patient Education  [place].

## 2021-05-13 LAB — BMP8+EGFR
BUN/Creatinine Ratio: 10 (ref 9–23)
BUN: 10 mg/dL (ref 6–24)
CO2: 22 mmol/L (ref 20–29)
Calcium: 10.1 mg/dL (ref 8.7–10.2)
Chloride: 107 mmol/L — ABNORMAL HIGH (ref 96–106)
Creatinine, Ser: 1.05 mg/dL — ABNORMAL HIGH (ref 0.57–1.00)
Glucose: 107 mg/dL — ABNORMAL HIGH (ref 65–99)
Potassium: 3.8 mmol/L (ref 3.5–5.2)
Sodium: 142 mmol/L (ref 134–144)
eGFR: 61 mL/min/{1.73_m2} (ref >59)

## 2021-05-13 LAB — HEMOGLOBIN A1C
Est. average glucose Bld gHb Est-mCnc: 197 mg/dL
Hgb A1c MFr Bld: 8.5 % — ABNORMAL HIGH (ref 4.8–5.6)

## 2021-05-22 ENCOUNTER — Other Ambulatory Visit: Payer: Self-pay | Admitting: Internal Medicine

## 2021-05-31 ENCOUNTER — Other Ambulatory Visit: Payer: Self-pay | Admitting: Internal Medicine

## 2021-06-13 ENCOUNTER — Other Ambulatory Visit: Payer: Self-pay | Admitting: Internal Medicine

## 2021-06-15 ENCOUNTER — Encounter: Payer: Self-pay | Admitting: Internal Medicine

## 2021-06-15 ENCOUNTER — Other Ambulatory Visit: Payer: Self-pay

## 2021-06-15 ENCOUNTER — Ambulatory Visit (INDEPENDENT_AMBULATORY_CARE_PROVIDER_SITE_OTHER): Payer: BC Managed Care – PPO | Admitting: Internal Medicine

## 2021-06-15 VITALS — BP 132/80 | HR 86 | Ht 61.8 in | Wt 173.4 lb

## 2021-06-15 DIAGNOSIS — Z794 Long term (current) use of insulin: Secondary | ICD-10-CM | POA: Insufficient documentation

## 2021-06-15 DIAGNOSIS — E785 Hyperlipidemia, unspecified: Secondary | ICD-10-CM

## 2021-06-15 DIAGNOSIS — E1165 Type 2 diabetes mellitus with hyperglycemia: Secondary | ICD-10-CM

## 2021-06-15 DIAGNOSIS — E1142 Type 2 diabetes mellitus with diabetic polyneuropathy: Secondary | ICD-10-CM | POA: Diagnosis not present

## 2021-06-15 MED ORDER — OZEMPIC (1 MG/DOSE) 4 MG/3ML ~~LOC~~ SOPN: 1.0000 mg | PEN_INJECTOR | SUBCUTANEOUS | 2 refills | Status: DC

## 2021-06-15 MED ORDER — ATORVASTATIN CALCIUM 20 MG PO TABS: 20.0000 mg | ORAL_TABLET | Freq: Every day | ORAL | 3 refills | Status: DC

## 2021-06-15 MED ORDER — TRESIBA FLEXTOUCH 200 UNIT/ML ~~LOC~~ SOPN: 34.0000 [IU] | PEN_INJECTOR | Freq: Every day | SUBCUTANEOUS | 6 refills | Status: DC

## 2021-06-15 NOTE — Progress Notes (Signed)
Name: Teresa Grant  MRN/ DOB: [id number], 05/13/1961   Age/ Sex: 60 y.o., female    PCP: Glendale Chard, MD   Reason for Endocrinology Evaluation: Type 2 Diabetes Mellitus     Date of Initial Endocrinology Visit: 06/15/2021     PATIENT IDENTIFIER: Teresa Grant is a 60 y.o. female with a past medical history of T2DM, HTN and Dyslipidemia . The patient presented for initial endocrinology clinic visit on 06/15/2021 for consultative assistance with her diabetes management.    HPI: [name] was    Diagnosed with DM 2008 Prior Medications tried/Intolerance: Glipizide , januvia  Currently checking blood sugars occasionally   Hypoglycemia episodes : yes               Symptoms: yes                  Frequency: occasionally  Hemoglobin A1c has ranged from 6.8% in 2020, peaking at 11.2% in 2022. Patient required assistance for hypoglycemia: no Patient has required hospitalization within the last 1 year from hyper or hypoglycemia:   In terms of diet, the patient eats 1 meal a day and grazes through the day    [place]: Xigduo 01-999 mg DID Ozempic  0.5 mg weekly  Tresiba 44 units daily    Statin: yes ACE-I/ARB: Intolerant to Lisinopril due to cough     METER DOWNLOAD SUMMARY: Date range evaluated: 8/23-9/21/2022 Fingerstick Blood Glucose Tests = 10 Overall Mean FS Glucose = 95 Standard Deviation = 30  BG Ranges: Low = 60 High = 157   Hypoglycemic Events/30 Days: BG < 50 = 0 Episodes of symptomatic severe hypoglycemia = 0   DIABETIC COMPLICATIONS: Microvascular complications:   Denies: CKD, retinopathy, neuropathy  Last eye exam: Completed 2021  Macrovascular complications:   Denies: CAD, PVD, CVA   PAST HISTORY: Past Medical History:  Past Medical History:  Diagnosis Date   Diabetes mellitus    Dysrhythmia    1985   Heart murmur    HLD (hyperlipidemia)    Hypertension    Kidney stone on right side 03/2013   Pneumonia    7  yrs ago   Past Surgical History:  Past Surgical History:  Procedure Laterality Date   ABDOMINAL HYSTERECTOMY  2001   BACK SURGERY     CERVICAL SPINE SURGERY  06/06/2019   Dr. Christella Noa, performed at surgical center   CESAREAN SECTION  1990   COLONOSCOPY     Dr. Deatra Ina normal   LUMBAR LAMINECTOMY/DECOMPRESSION MICRODISCECTOMY  03/20/2012   Procedure: LUMBAR LAMINECTOMY/DECOMPRESSION MICRODISCECTOMY;  Surgeon: Sinclair Ship, MD;  Location: [facility];  Service: Orthopedics;  Laterality: Left;  Left sided lumbar 4-5 microdisectomy   SPINE SURGERY  2013    Social History:  reports that she quit smoking about 17 years ago. Her smoking use included cigarettes. She has a 12.50 pack-year smoking history. She has never used smokeless tobacco. She reports that she does not drink alcohol and does not use drugs. Family History:  Family History  Problem Relation Age of Onset   Heart disease Mother    Kidney disease Father    Esophageal cancer Maternal Uncle    Colon cancer Cousin    Stroke Neg Hx    Cancer Neg Hx    Colon polyps Neg Hx    Rectal cancer Neg Hx    Stomach cancer Neg Hx      HOME MEDICATIONS: Allergies as of 06/15/2021  Reactions   Lisinopril Cough   Hydrochlorothiazide Other (See Comments)   pancreatitis        Medication List        Accurate as of June 15, 2021  1:30 PM. If you have any questions, ask your nurse or doctor.          amLODipine 10 MG tablet Commonly known as: NORVASC TAKE ONE TABLET BY MOUTH DAILY   gabapentin 100 MG capsule Commonly known as: NEURONTIN TAKE ONE CAPSULE BY MOUTH THREE TIMES A DAY   glucose blood test strip Commonly known as: OneTouch Verio Use as directed to check blood sugars 2 times per day dx: e11.65   Ozempic (0.25 or 0.5 MG/DOSE) 2 MG/1.5ML Sopn Generic drug: Semaglutide(0.25 or 0.5MG /DOS) DIAL AND INJECT UNDER THE SKIN 0.5 MG WEEKLY   pravastatin 40 MG tablet Commonly known as: PRAVACHOL TAKE ONE  TABLET BY MOUTH EVERY NIGHT AT BEDTIME   telmisartan 20 MG tablet Commonly known as: MICARDIS TAKE ONE TABLET BY MOUTH DAILY   Tresiba FlexTouch 200 UNIT/ML FlexTouch Pen Generic drug: insulin degludec INJECT 44 UNITS UNDER THE SKIN EVERY NIGHT AT BEDTIME - DO NOT EXCEED 100 UNITS   VITAMIN C PO Take by mouth.   Vitamin D (Ergocalciferol) 1.25 MG (50000 UNIT) Caps capsule Commonly known as: DRISDOL Take 1 capsule (50,000 Units total) by mouth every 7 (seven) days.   Xigduo XR 01-999 MG Tb24 Generic drug: Dapagliflozin-metFORMIN HCl ER Take 2 tablets by mouth daily.   ZINCATE PO Take by mouth.         ALLERGIES: Allergies  Allergen Reactions   Lisinopril Cough   Hydrochlorothiazide Other (See Comments)    pancreatitis     REVIEW OF SYSTEMS: A comprehensive ROS was conducted with the patient and is negative except as per HPI and below:  Review of Systems  Gastrointestinal:  Negative for diarrhea and nausea.  Genitourinary:  Positive for frequency.  Endo/Heme/Allergies:  Negative for polydipsia.     OBJECTIVE:   VITAL SIGNS: BP 132/80 (BP Location: Left Arm, Patient Position: Sitting, Cuff Size: Small)   Pulse 86   Ht 5' 1.8" (1.57 m)   Wt 173 lb 6.4 oz (78.7 kg)   SpO2 98%   BMI 31.92 kg/m    PHYSICAL EXAM:  General: Pt appears well and is in NAD  Neck: General: Supple without adenopathy or carotid bruits. Thyroid: Thyroid size normal.  No goiter or nodules appreciated.  Lungs: Clear with good BS bilat with no rales, rhonchi, or wheezes  Heart: RRR with normal S1 and S2 and no gallops; no murmurs; no rub  Abdomen: Normoactive bowel sounds, soft, nontender, without masses or organomegaly palpable  Extremities:  Lower extremities - No pretibial edema. No lesions.  Neuro: MS is good with appropriate affect, pt is alert and Ox3    DM foot exam: 06/15/2021  The skin of the feet is intact without sores or ulcerations. The pedal pulses are 2+ on right and  2+ on left. The sensation is decreased  to a screening 5.07, 10 gram monofilament bilaterally   DATA REVIEWED:  Lab Results  Component Value Date   HGBA1C 8.5 (H) 05/12/2021   HGBA1C 11.2 (H) 03/14/2021   HGBA1C 8.0 (H) 12/02/2020   Lab Results  Component Value Date   MICROALBUR 150 12/02/2020   LDLCALC 100 (H) 09/22/2020   CREATININE 1.05 (H) 05/12/2021   Lab Results  Component Value Date   MICRALBCREAT 30-300 12/02/2020    Lab  Results  Component Value Date   CHOL 165 09/22/2020   HDL 41 09/22/2020   LDLCALC 100 (H) 09/22/2020   TRIG 136 09/22/2020   CHOLHDL 4.0 09/22/2020        ASSESSMENT / PLAN / RECOMMENDATIONS:   1) Type 2 Diabetes Mellitus, Poorly controlled, With Neuropathic complications - Most recent A1c of 8.5 %. Goal A1c < 7.0 %.    Plan: GENERAL: I have discussed with the patient the pathophysiology of diabetes. We went over the natural progression of the disease. We talked about both insulin resistance and insulin deficiency. We stressed the importance of lifestyle changes including diet and exercise. I explained the complications associated with diabetes including retinopathy, nephropathy, neuropathy as well as increased risk of cardiovascular disease. We went over the benefit seen with glycemic control.   I explained to the patient that diabetic patients are at higher than normal risk for amputations. We discussed the importance of avoiding sugar sweetened beverages and avoiding snacks  Will reduce Insulin due to hypoglycemia  Will increase Ozempic as she is tolerating it well    MEDICATIONS: Continue Xigduo 01-999 mg 1 tablet twice a day  Increase Ozempic to 1 mg weekly  Decrease Tresiba to 34 units daily   EDUCATION / INSTRUCTIONS: BG monitoring instructions: Patient is instructed to check her blood sugars 1 times a day, fasting . Call [facility] if: BG persistently < 70  I reviewed the Rule of 15 for the treatment of  hypoglycemia in detail with the patient. Literature supplied.   2) Diabetic complications:  Eye: Does not have known diabetic retinopathy.  Neuro/ Feet: Does  have known diabetic peripheral neuropathy based on exam 9/21/202 Renal: Patient does not have known baseline CKD. She is  on an ACEI/ARB at present.  3) Dyslipidemia:    - LDL above goal at 100 mg/dL , she is on Pravastatin , will switch to Atorvastatin  - Discussed cardiovascular benefits of statin therapy   Medication   Stop Pravastatin 40 mg daily  Start Atorvastatin 20 mg daily     F/U in 3 months    Signed electronically by: Mack Guise, MD  [facility]  [facility] [address] [place],  [address] Phone: [phone] FAX: [phone]   CC: Glendale Chard, [address] [place] [address] Phone: [phone]  Fax: [phone]    Return to Endocrinology clinic as below: Future Appointments  Date Time Provider [place]  08/22/2021  2:30 PM Glendale Chard, MD [facility] None  12/12/2021  3:00 PM Glendale Chard, MD [facility] None

## 2021-06-15 NOTE — Patient Instructions (Addendum)
Continue Xigduo 01-999 mg 1 tablet twice a day  Increase Ozempic to 1 mg weekly  Decrease Tresiba to 34 units daily   Will switch Pravastatin to Atorvastatin 20 mg daily   HOW TO TREAT LOW BLOOD SUGARS (Blood sugar LESS THAN 70 MG/DL) Please follow the RULE OF 15 for the treatment of hypoglycemia treatment (when your (blood sugars are less than 70 mg/dL)   STEP 1: Take 15 grams of carbohydrates when your blood sugar is low, which includes:  3-4 GLUCOSE TABS  OR 3-4 OZ OF JUICE OR REGULAR SODA OR ONE TUBE OF GLUCOSE GEL    STEP 2: RECHECK blood sugar in 15 MINUTES STEP 3: If your blood sugar is still low at the 15 minute recheck --> then, go back to STEP 1 and treat AGAIN with another 15 grams of carbohydrates.

## 2021-06-22 ENCOUNTER — Other Ambulatory Visit: Payer: Self-pay

## 2021-06-22 MED ORDER — AMLODIPINE BESYLATE 10 MG PO TABS: 10.0000 mg | ORAL_TABLET | Freq: Every day | ORAL | 1 refills | Status: DC

## 2021-07-14 ENCOUNTER — Ambulatory Visit: Payer: BC Managed Care – PPO | Admitting: Internal Medicine

## 2021-07-15 ENCOUNTER — Other Ambulatory Visit: Payer: Self-pay | Admitting: Internal Medicine

## 2021-08-14 ENCOUNTER — Other Ambulatory Visit: Payer: Self-pay | Admitting: Internal Medicine

## 2021-08-22 ENCOUNTER — Ambulatory Visit: Payer: BC Managed Care – PPO | Admitting: Internal Medicine

## 2021-09-05 ENCOUNTER — Encounter: Payer: Self-pay | Admitting: Internal Medicine

## 2021-09-05 ENCOUNTER — Ambulatory Visit: Payer: BC Managed Care – PPO | Admitting: Internal Medicine

## 2021-09-05 ENCOUNTER — Other Ambulatory Visit: Payer: Self-pay

## 2021-09-05 VITALS — BP 124/70 | HR 79 | Temp 98.1°F | Ht 61.8 in | Wt 166.0 lb

## 2021-09-05 DIAGNOSIS — R350 Frequency of micturition: Secondary | ICD-10-CM | POA: Diagnosis not present

## 2021-09-05 DIAGNOSIS — R0789 Other chest pain: Secondary | ICD-10-CM | POA: Diagnosis not present

## 2021-09-05 DIAGNOSIS — K219 Gastro-esophageal reflux disease without esophagitis: Secondary | ICD-10-CM | POA: Diagnosis not present

## 2021-09-05 DIAGNOSIS — E1165 Type 2 diabetes mellitus with hyperglycemia: Secondary | ICD-10-CM | POA: Diagnosis not present

## 2021-09-05 DIAGNOSIS — I10 Essential (primary) hypertension: Secondary | ICD-10-CM

## 2021-09-05 DIAGNOSIS — Z8249 Family history of ischemic heart disease and other diseases of the circulatory system: Secondary | ICD-10-CM

## 2021-09-05 DIAGNOSIS — E6609 Other obesity due to excess calories: Secondary | ICD-10-CM

## 2021-09-05 DIAGNOSIS — Z6830 Body mass index (BMI) 30.0-30.9, adult: Secondary | ICD-10-CM

## 2021-09-05 DIAGNOSIS — Z23 Encounter for immunization: Secondary | ICD-10-CM

## 2021-09-05 LAB — POCT URINALYSIS DIPSTICK
Bilirubin, UA: NEGATIVE
Glucose, UA: POSITIVE — AB
Ketones, UA: NEGATIVE
Leukocytes, UA: NEGATIVE
Nitrite, UA: NEGATIVE
Protein, UA: POSITIVE — AB
Spec Grav, UA: 1.02 (ref 1.010–1.025)
Urobilinogen, UA: 0.2 E.U./dL
pH, UA: 7 (ref 5.0–8.0)

## 2021-09-05 MED ORDER — FAMOTIDINE 20 MG PO TABS: 20.0000 mg | ORAL_TABLET | Freq: Two times a day (BID) | ORAL | 1 refills | Status: DC

## 2021-09-05 NOTE — Progress Notes (Signed)
I,Katawbba Wiggins,acting as a Education administrator for [name], MD.,have documented all relevant documentation on the behalf of [name], MD,as directed by  [name], MD while in the presence of [name], MD.  This visit occurred during the SARS-CoV-2 public health emergency.  Safety protocols were in place, including screening questions prior to the visit, additional usage of staff PPE, and extensive cleaning of exam room while observing appropriate contact time as indicated for disinfecting solutions.  Subjective:     Patient ID: Teresa Grant , female    DOB: [birth year] , 60 y.o.   MRN: [id number]   Chief Complaint  Patient presents with   Diabetes   Hypertension    HPI  She presents today for diabetes/HTN check.  She reports compliance with meds. States her sugars went up to 500 after drinking 2 glasses of orange juice.   Diabetes She presents for her follow-up diabetic visit. She has type 2 diabetes mellitus. Her disease course has been stable. There are no hypoglycemic associated symptoms. Pertinent negatives for hypoglycemia include no headaches. Associated symptoms include chest pain. Pertinent negatives for diabetes include no blurred vision, no polydipsia, no polyphagia and no polyuria. There are no hypoglycemic complications. Risk factors for coronary artery disease include diabetes mellitus, dyslipidemia, hypertension, obesity, post-menopausal, sedentary lifestyle and stress. She is following a diabetic diet. She participates in exercise intermittently. Her breakfast blood glucose is taken between 8-9 am. Her breakfast blood glucose range is generally 130-140 mg/dl. An ACE inhibitor/angiotensin II receptor blocker is being taken. Eye exam is current.  Hypertension This is a chronic problem. The current episode started more than 1 year ago. The problem has been gradually improving since onset. The problem is controlled. Associated symptoms include chest pain.  Pertinent negatives include no blurred vision, headaches or palpitations. The current treatment provides moderate improvement. Compliance problems include exercise.     Past Medical History:  Diagnosis Date   Diabetes mellitus    Dysrhythmia    1985   Heart murmur    HLD (hyperlipidemia)    Hypertension    Kidney stone on right side 03/2013   Pneumonia    7 yrs ago     Family History  Problem Relation Age of Onset   Heart disease Mother    Kidney disease Father    Esophageal cancer Maternal Uncle    Colon cancer Cousin    Stroke Neg Hx    Cancer Neg Hx    Colon polyps Neg Hx    Rectal cancer Neg Hx    Stomach cancer Neg Hx      Current Outpatient Medications:    amLODipine (NORVASC) 10 MG tablet, Take 1 tablet (10 mg total) by mouth daily., Disp: 90 tablet, Rfl: 1   Ascorbic Acid (VITAMIN C PO), Take by mouth., Disp: , Rfl:    atorvastatin (LIPITOR) 20 MG tablet, Take 1 tablet (20 mg total) by mouth daily., Disp: 90 tablet, Rfl: 3   Dapagliflozin-metFORMIN HCl ER (XIGDUO XR) 01-999 MG TB24, Take 2 tablets by mouth daily., Disp: 60 tablet, Rfl: 5   famotidine (PEPCID) 20 MG tablet, Take 1 tablet (20 mg total) by mouth 2 (two) times daily., Disp: 60 tablet, Rfl: 1   gabapentin (NEURONTIN) 100 MG capsule, TAKE ONE CAPSULE BY MOUTH THREE TIMES A DAY, Disp: 270 capsule, Rfl: 1   glucose blood (ONETOUCH VERIO) test strip, Use as directed to check blood sugars 2 times per day dx: e11.65, Disp: 100  each, Rfl: 10   insulin degludec (TRESIBA FLEXTOUCH) 200 UNIT/ML FlexTouch Pen, Inject 34 Units into the skin daily in the afternoon., Disp: 15 mL, Rfl: 6   telmisartan (MICARDIS) 20 MG tablet, TAKE ONE TABLET BY MOUTH DAILY, Disp: 90 tablet, Rfl: 1   Vitamin D, Ergocalciferol, (DRISDOL) 1.25 MG (50000 UNIT) CAPS capsule, TAKE ONE CAPSULE BY MOUTH ONCE WEEKLY, Disp: 12 capsule, Rfl: 1   Zinc Sulfate (ZINCATE PO), Take by mouth., Disp: , Rfl:    Semaglutide, 1 MG/DOSE, (OZEMPIC, 1 MG/DOSE,)  4 MG/3ML SOPN, Inject 1 mg into the skin once a week. (Patient not taking: Reported on 09/05/2021), Disp: 9 mL, Rfl: 2   Allergies  Allergen Reactions   Lisinopril Cough   Hydrochlorothiazide Other (See Comments)    pancreatitis     Review of Systems  Constitutional: Negative.   Eyes:  Negative for blurred vision.  Respiratory: Negative.    Cardiovascular:  Positive for chest pain. Negative for palpitations.       C/o sharp, shooting pains on the left side of her chest. No associated sob, palpitations. Lying down going to sleep resolves her sx. Unable to state what triggers her sx. Usually occurs at rest.   Gastrointestinal:  Positive for abdominal distention and abdominal pain.       She c/o epigastric pain that started back in October. Described as a sharp, stabbing pain. There is pain with reaching, she feels like a "catch" is in her belly. Sometimes occurs in her sides as well. She admits she is passing gas more than usual.  She denies n/v. Admits to having a "large dump" bowel movement on occasion.   Endocrine: Negative.  Negative for polydipsia, polyphagia and polyuria.  Genitourinary: Negative.   Allergic/Immunologic: Negative.   Neurological: Negative.  Negative for headaches.  Hematological: Negative.   Psychiatric/Behavioral: Negative.      Today's Vitals   09/05/21 1408  BP: 124/70  Pulse: 79  Temp: 98.1 F (36.7 C)  Weight: 166 lb (75.3 kg)  Height: 5' 1.8" (1.57 m)   Body mass index is 30.56 kg/m.  Wt Readings from Last 3 Encounters:  09/05/21 166 lb (75.3 kg)  06/15/21 173 lb 6.4 oz (78.7 kg)  05/12/21 173 lb 3.2 oz (78.6 kg)    BP Readings from Last 3 Encounters:  09/05/21 124/70  06/15/21 132/80  05/12/21 132/78    Objective:  Physical Exam Vitals and nursing note reviewed.  Constitutional:      Appearance: Normal appearance. She is well-developed.  HENT:     Head: Normocephalic and atraumatic.  Cardiovascular:     Rate and Rhythm: Normal rate and  regular rhythm.     Heart sounds: Normal heart sounds.  Pulmonary:     Effort: Pulmonary effort is normal.     Breath sounds: Normal breath sounds.  Abdominal:     General: Bowel sounds are normal. There is distension. There is no abdominal bruit.     Palpations: Abdomen is soft.     Tenderness: There is abdominal tenderness in the right lower quadrant, epigastric area and left lower quadrant. There is no right CVA tenderness or left CVA tenderness.  Skin:    General: Skin is warm.  Neurological:     General: No focal deficit present.     Mental Status: She is alert.  Psychiatric:        Mood and Affect: Mood normal.        Behavior: Behavior normal.  Assessment And Plan:     1. Uncontrolled type 2 diabetes mellitus with hyperglycemia (HCC) Comments: Chronic, I will check labs as listed below. Encouraged to avoid orange juice unless BS<80. I will adjust meds as needed.  - Hemoglobin A1c - CMP14+EGFR  2. Essential hypertension Comments: Chronc, well controlled. She is encouraged to follow low sodium diet.  - CMP14+EGFR  3. Gastroesophageal reflux disease without esophagitis Comments: I will start her on famotidine 74m BID. She will let me know if her sx improve in the next week. I will switch to PPI if her sx persist.   4. Chest discomfort Comments: I think her sx may be GI related. However, due to cardiac risk factors, I will check EKG. EKG performed, NSR w/o acute changes. - EKG 12-Lead - Ambulatory referral to Cardiology  5. Urinary frequency Comments: I will check urinalysis, likely due to hyperglycemia.  - POCT Urinalysis Dipstick (81002)  6. Class 1 obesity due to excess calories with serious comorbidity and body mass index (BMI) of 30.0 to 30.9 in adult Comments: Again, importance of regular exercise was discussed with the patient.   7. Need for vaccination Comments: She was given flu vaccine x 1.   8. Family history of heart disease Comments: She agrees  to Cardiology referral for cardiac risk assessment.  - Ambulatory referral to Cardiology   Patient was given opportunity to ask questions. Patient verbalized understanding of the plan and was able to repeat key elements of the plan. All questions were answered to their satisfaction.   I, RMaximino Greenland MD, have reviewed all documentation for this visit. The documentation on 09/05/21 for the exam, diagnosis, procedures, and orders are all accurate and complete.   IF YOU HAVE BEEN REFERRED TO A SPECIALIST, IT MAY TAKE 1-2 WEEKS TO SCHEDULE/PROCESS THE REFERRAL. IF YOU HAVE NOT HEARD FROM US/SPECIALIST IN TWO WEEKS, PLEASE GIVE [place] CALL AT [phone] X [phone].   THE PATIENT IS ENCOURAGED TO PRACTICE SOCIAL DISTANCING DUE TO THE COVID-19 PANDEMIC.

## 2021-09-05 NOTE — Patient Instructions (Signed)

## 2021-09-06 LAB — CMP14+EGFR
ALT: 16 IU/L (ref 0–32)
AST: 17 IU/L (ref 0–40)
Albumin/Globulin Ratio: 1.5 (ref 1.2–2.2)
Albumin: 4.4 g/dL (ref 3.8–4.9)
Alkaline Phosphatase: 220 IU/L — ABNORMAL HIGH (ref 44–121)
BUN/Creatinine Ratio: 9 — ABNORMAL LOW (ref 12–28)
BUN: 12 mg/dL (ref 8–27)
Bilirubin Total: 0.4 mg/dL (ref 0.0–1.2)
CO2: 25 mmol/L (ref 20–29)
Calcium: 10.2 mg/dL (ref 8.7–10.3)
Chloride: 107 mmol/L — ABNORMAL HIGH (ref 96–106)
Creatinine, Ser: 1.32 mg/dL — ABNORMAL HIGH (ref 0.57–1.00)
Globulin, Total: 3 g/dL (ref 1.5–4.5)
Glucose: 130 mg/dL — ABNORMAL HIGH (ref 70–99)
Potassium: 4.7 mmol/L (ref 3.5–5.2)
Sodium: 146 mmol/L — ABNORMAL HIGH (ref 134–144)
Total Protein: 7.4 g/dL (ref 6.0–8.5)
eGFR: 46 mL/min/{1.73_m2} — ABNORMAL LOW (ref >59)

## 2021-09-06 LAB — HEMOGLOBIN A1C
Est. average glucose Bld gHb Est-mCnc: 177 mg/dL
Hgb A1c MFr Bld: 7.8 % — ABNORMAL HIGH (ref 4.8–5.6)

## 2021-09-10 LAB — ALKALINE PHOSPHATASE, ISOENZYMES
Alkaline Phosphatase: 225 IU/L — ABNORMAL HIGH (ref 44–121)
BONE FRACTION: 49 % (ref 14–68)
INTESTINAL FRAC.: 2 % (ref 0–18)
LIVER FRACTION: 49 % (ref 18–85)

## 2021-09-10 LAB — SPECIMEN STATUS REPORT

## 2021-09-10 LAB — GAMMA GT: GGT: 13 IU/L (ref 0–60)

## 2021-09-12 ENCOUNTER — Ambulatory Visit: Payer: BC Managed Care – PPO | Admitting: Internal Medicine

## 2021-09-14 ENCOUNTER — Ambulatory Visit: Payer: BC Managed Care – PPO | Admitting: Internal Medicine

## 2021-09-14 NOTE — Progress Notes (Deleted)
Name: Teresa Grant  MRN/ DOB: [id number], 1961-04-01   Age/ Sex: 60 y.o., female    PCP: Glendale Chard, MD   Reason for Endocrinology Evaluation: Type 2 Diabetes Mellitus     Date of Initial Endocrinology Visit: 06/15/2021    PATIENT IDENTIFIER: [name] is a 60 y.o. female with a past medical history of T2DM, HTN and Dyslipidemia . The patient presented for initial endocrinology clinic visit on 06/15/2021 for consultative assistance with her diabetes management.    HPI: [name] was    Diagnosed with DM 2008 Prior Medications tried/Intolerance: Glipizide , januvia  Hemoglobin A1c has ranged from 6.8% in 2020, peaking at 11.2% in 2022.   On her initial visit to our clinic she had an A1c of 8.5%, we continued Xigduo, increase Ozempic, and decrease basal insulin  SUBJECTIVE:   During the last visit (06/15/2021): A1c was 8.5%, increased Ozempic, decrease Tresiba, and continued Xigduo  Today (09/14/21): [name] is here for follow-up on diabetes management.  She checks her blood sugars *** times daily, preprandial to breakfast and ***. The patient has *** had hypoglycemic episodes since the last clinic visit, which typically occur *** x / - most often occuring ***. The patient is *** symptomatic with these episodes, with symptoms of {symptoms; hypoglycemia:[id number]}.       HOME DIABETES REGIMEN: Xigduo 01-999 mg twice daily Ozempic 1 mg weekly  Tresiba 34 units daily    Statin: yes ACE-I/ARB: Intolerant to Lisinopril due to cough     METER DOWNLOAD SUMMARY: Date range evaluated: 8/23-9/21/2022 Fingerstick Blood Glucose Tests = 10 Overall Mean FS Glucose = 95 Standard Deviation = 30  BG Ranges: Low = 60 High = 157   Hypoglycemic Events/30 Days: BG < 50 = 0 Episodes of symptomatic severe hypoglycemia = 0   DIABETIC COMPLICATIONS: Microvascular complications:   Denies: CKD, retinopathy, neuropathy  Last eye exam: Completed  2021  Macrovascular complications:   Denies: CAD, PVD, CVA   PAST HISTORY: Past Medical History:  Past Medical History:  Diagnosis Date   Diabetes mellitus    Dysrhythmia    1985   Heart murmur    HLD (hyperlipidemia)    Hypertension    Kidney stone on right side 03/2013   Pneumonia    7 yrs ago   Past Surgical History:  Past Surgical History:  Procedure Laterality Date   ABDOMINAL HYSTERECTOMY  2001   BACK SURGERY     CERVICAL SPINE SURGERY  06/06/2019   Dr. Christella Noa, performed at surgical center   CESAREAN SECTION  1990   COLONOSCOPY     Dr. Deatra Ina normal   LUMBAR LAMINECTOMY/DECOMPRESSION MICRODISCECTOMY  03/20/2012   Procedure: LUMBAR LAMINECTOMY/DECOMPRESSION MICRODISCECTOMY;  Surgeon: Sinclair Ship, MD;  Location: [facility];  Service: Orthopedics;  Laterality: Left;  Left sided lumbar 4-5 microdisectomy   SPINE SURGERY  2013    Social History:  reports that she quit smoking about 17 years ago. Her smoking use included cigarettes. She has a 12.50 pack-year smoking history. She has never used smokeless tobacco. She reports that she does not drink alcohol and does not use drugs. Family History:  Family History  Problem Relation Age of Onset   Heart disease Mother    Kidney disease Father    Esophageal cancer Maternal Uncle    Colon cancer Cousin    Stroke Neg Hx    Cancer Neg Hx    Colon polyps Neg Hx    Rectal cancer Neg  Hx    Stomach cancer Neg Hx      HOME MEDICATIONS: Allergies as of 09/14/2021       Reactions   Lisinopril Cough   Hydrochlorothiazide Other (See Comments)   pancreatitis        Medication List        Accurate as of September 14, 2021 12:46 PM. If you have any questions, ask your nurse or doctor.          amLODipine 10 MG tablet Commonly known as: NORVASC Take 1 tablet (10 mg total) by mouth daily.   atorvastatin 20 MG tablet Commonly known as: LIPITOR Take 1 tablet (20 mg total) by mouth daily.   famotidine 20 MG  tablet Commonly known as: PEPCID Take 1 tablet (20 mg total) by mouth 2 (two) times daily.   gabapentin 100 MG capsule Commonly known as: NEURONTIN TAKE ONE CAPSULE BY MOUTH THREE TIMES A DAY   glucose blood test strip Commonly known as: OneTouch Verio Use as directed to check blood sugars 2 times per day dx: e11.65   Ozempic (1 MG/DOSE) 4 MG/3ML Sopn Generic drug: Semaglutide (1 MG/DOSE) Inject 1 mg into the skin once a week.   telmisartan 20 MG tablet Commonly known as: MICARDIS TAKE ONE TABLET BY MOUTH DAILY   Tresiba FlexTouch 200 UNIT/ML FlexTouch Pen Generic drug: insulin degludec Inject 34 Units into the skin daily in the afternoon.   VITAMIN C PO Take by mouth.   Vitamin D (Ergocalciferol) 1.25 MG (50000 UNIT) Caps capsule Commonly known as: DRISDOL TAKE ONE CAPSULE BY MOUTH ONCE WEEKLY   Xigduo XR 01-999 MG Tb24 Generic drug: Dapagliflozin-metFORMIN HCl ER Take 2 tablets by mouth daily.   ZINCATE PO Take by mouth.         ALLERGIES: Allergies  Allergen Reactions   Lisinopril Cough   Hydrochlorothiazide Other (See Comments)    pancreatitis     REVIEW OF SYSTEMS: A comprehensive ROS was conducted with the patient and is negative except as per HPI and below:  Review of Systems  Gastrointestinal:  Negative for diarrhea and nausea.  Genitourinary:  Positive for frequency.  Endo/Heme/Allergies:  Negative for polydipsia.     OBJECTIVE:   VITAL SIGNS: There were no vitals taken for this visit.   PHYSICAL EXAM:  General: Pt appears well and is in NAD  Neck: General: Supple without adenopathy or carotid bruits. Thyroid: Thyroid size normal.  No goiter or nodules appreciated.  Lungs: Clear with good BS bilat with no rales, rhonchi, or wheezes  Heart: RRR with normal S1 and S2 and no gallops; no murmurs; no rub  Abdomen: Normoactive bowel sounds, soft, nontender, without masses or organomegaly palpable  Extremities:  Lower extremities - No  pretibial edema. No lesions.  Neuro: MS is good with appropriate affect, pt is alert and Ox3    DM foot exam: 06/15/2021  The skin of the feet is intact without sores or ulcerations. The pedal pulses are 2+ on right and 2+ on left. The sensation is decreased  to a screening 5.07, 10 gram monofilament bilaterally   DATA REVIEWED:  Lab Results  Component Value Date   HGBA1C 7.8 (H) 09/05/2021   HGBA1C 8.5 (H) 05/12/2021   HGBA1C 11.2 (H) 03/14/2021   Lab Results  Component Value Date   MICROALBUR 150 12/02/2020   LDLCALC 100 (H) 09/22/2020   CREATININE 1.32 (H) 09/05/2021   Lab Results  Component Value Date   MICRALBCREAT 30-300 12/02/2020  Lab Results  Component Value Date   CHOL 165 09/22/2020   HDL 41 09/22/2020   LDLCALC 100 (H) 09/22/2020   TRIG 136 09/22/2020   CHOLHDL 4.0 09/22/2020        ASSESSMENT / PLAN / RECOMMENDATIONS:   1) Type 2 Diabetes Mellitus, Poorly controlled, With Neuropathic complications - Most recent A1c of 8.5 %. Goal A1c < 7.0 %.    Plan: GENERAL: I have discussed with the patient the pathophysiology of diabetes. We went over the natural progression of the disease. We talked about both insulin resistance and insulin deficiency. We stressed the importance of lifestyle changes including diet and exercise. I explained the complications associated with diabetes including retinopathy, nephropathy, neuropathy as well as increased risk of cardiovascular disease. We went over the benefit seen with glycemic control.   I explained to the patient that diabetic patients are at higher than normal risk for amputations. We discussed the importance of avoiding sugar sweetened beverages and avoiding snacks  Will reduce Insulin due to hypoglycemia  Will increase Ozempic as she is tolerating it well    MEDICATIONS: Continue Xigduo 01-999 mg 1 tablet twice a day  Increase Ozempic to 1 mg weekly  Decrease Tresiba to 34 units daily   EDUCATION /  INSTRUCTIONS: BG monitoring instructions: Patient is instructed to check her blood sugars 1 times a day, fasting . Call [facility] if: BG persistently < 70  I reviewed the Rule of 15 for the treatment of hypoglycemia in detail with the patient. Literature supplied.   2) Diabetic complications:  Eye: Does not have known diabetic retinopathy.  Neuro/ Feet: Does  have known diabetic peripheral neuropathy based on exam 9/21/202 Renal: Patient does not have known baseline CKD. She is  on an ACEI/ARB at present.  3) Dyslipidemia:    - LDL above goal at 100 mg/dL , she is on Pravastatin , will switch to Atorvastatin  - Discussed cardiovascular benefits of statin therapy   Medication   Stop Pravastatin 40 mg daily  Start Atorvastatin 20 mg daily     F/U in 3 months    Signed electronically by: Mack Guise, MD  [facility]  [address] [place], [place] [address] Phone: [phone] FAX: [phone]   CC: Glendale Chard, [address] [place] [address] Phone: [phone]  Fax: [phone]    Return to Endocrinology clinic as below: Future Appointments  Date Time Provider [place]  09/14/2021  2:20 PM Ammon Muscatello, Melanie Crazier, MD [facility] None  10/31/2021  8:40 AM Skeet Latch, MD [facility]  12/12/2021  3:00 PM Glendale Chard, MD [facility] None

## 2021-09-15 ENCOUNTER — Encounter: Payer: Self-pay | Admitting: Internal Medicine

## 2021-09-15 ENCOUNTER — Other Ambulatory Visit: Payer: Self-pay

## 2021-09-15 ENCOUNTER — Ambulatory Visit (INDEPENDENT_AMBULATORY_CARE_PROVIDER_SITE_OTHER): Payer: BC Managed Care – PPO | Admitting: Internal Medicine

## 2021-09-15 VITALS — BP 140/86 | HR 84 | Ht 61.8 in | Wt 167.0 lb

## 2021-09-15 DIAGNOSIS — R739 Hyperglycemia, unspecified: Secondary | ICD-10-CM | POA: Diagnosis not present

## 2021-09-15 DIAGNOSIS — E1165 Type 2 diabetes mellitus with hyperglycemia: Secondary | ICD-10-CM

## 2021-09-15 DIAGNOSIS — Z794 Long term (current) use of insulin: Secondary | ICD-10-CM

## 2021-09-15 DIAGNOSIS — E1142 Type 2 diabetes mellitus with diabetic polyneuropathy: Secondary | ICD-10-CM | POA: Diagnosis not present

## 2021-09-15 DIAGNOSIS — E785 Hyperlipidemia, unspecified: Secondary | ICD-10-CM | POA: Diagnosis not present

## 2021-09-15 LAB — GLUCOSE, POCT (MANUAL RESULT ENTRY): POC Glucose: 109 mg/dl — AB (ref 70–99)

## 2021-09-15 NOTE — Progress Notes (Signed)
Name: Teresa Grant  MRN/ DOB: [id number], 1961-07-15   Age/ Sex: 60 y.o., female    PCP: Glendale Chard, MD   Reason for Endocrinology Evaluation: Type 2 Diabetes Mellitus     Date of Initial Endocrinology Visit: 06/15/2021    PATIENT IDENTIFIER: Teresa Grant is a 60 y.o. female with a past medical history of T2DM, HTN and Dyslipidemia . The patient presented for initial endocrinology clinic visit on 06/15/2021 for consultative assistance with her diabetes management.    HPI: Teresa Grant was    Diagnosed with DM 2008 Prior Medications tried/Intolerance: Glipizide , januvia  Hemoglobin A1c has ranged from 6.8% in 2020, peaking at 11.2% in 2022.   On her initial visit to our clinic she had an A1c of 8.5%, we continued Xigduo, increase Ozempic, and decrease basal insulin  SUBJECTIVE:   During the last visit (06/15/2021): A1c was 8.5%, increased Ozempic, decrease Tresiba, and continued Xigduo  Today (09/15/21): Teresa Grant is here for follow-up on diabetes management.  She checks her blood sugars occasionally . The patient has not had hypoglycemic episodes since the last clinic visit.      HOME DIABETES REGIMEN: Xigduo 01-999 mg twice daily Ozempic 1 mg weekly  Tresiba 34 units daily    Statin: yes ACE-I/ARB: Intolerant to Lisinopril due to cough     METER DOWNLOAD SUMMARY: Date range evaluated: 8/23-9/21/2022 Fingerstick Blood Glucose Tests = 10 Overall Mean FS Glucose = 95 Standard Deviation = 30  BG Ranges: Low = 60 High = 157   Hypoglycemic Events/30 Days: BG < 50 = 0 Episodes of symptomatic severe hypoglycemia = 0   DIABETIC COMPLICATIONS: Microvascular complications:   Denies: CKD, retinopathy, neuropathy  Last eye exam: Completed 2021  Macrovascular complications:   Denies: CAD, PVD, CVA   PAST HISTORY: Past Medical History:  Past Medical History:  Diagnosis Date   Diabetes mellitus    Dysrhythmia    1985   Heart murmur     HLD (hyperlipidemia)    Hypertension    Kidney stone on right side 03/2013   Pneumonia    7 yrs ago   Past Surgical History:  Past Surgical History:  Procedure Laterality Date   ABDOMINAL HYSTERECTOMY  2001   BACK SURGERY     CERVICAL SPINE SURGERY  06/06/2019   Dr. Christella Noa, performed at surgical center   CESAREAN SECTION  1990   COLONOSCOPY     Dr. Deatra Ina normal   LUMBAR LAMINECTOMY/DECOMPRESSION MICRODISCECTOMY  03/20/2012   Procedure: LUMBAR LAMINECTOMY/DECOMPRESSION MICRODISCECTOMY;  Surgeon: Sinclair Ship, MD;  Location: [facility];  Service: Orthopedics;  Laterality: Left;  Left sided lumbar 4-5 microdisectomy   SPINE SURGERY  2013    Social History:  reports that she quit smoking about 17 years ago. Her smoking use included cigarettes. She has a 12.50 pack-year smoking history. She has never used smokeless tobacco. She reports that she does not drink alcohol and does not use drugs. Family History:  Family History  Problem Relation Age of Onset   Heart disease Mother    Kidney disease Father    Esophageal cancer Maternal Uncle    Colon cancer Cousin    Stroke Neg Hx    Cancer Neg Hx    Colon polyps Neg Hx    Rectal cancer Neg Hx    Stomach cancer Neg Hx      HOME MEDICATIONS: Allergies as of 09/15/2021       Reactions   Lisinopril Cough  Hydrochlorothiazide Other (See Comments)   pancreatitis        Medication List        Accurate as of September 15, 2021  2:18 PM. If you have any questions, ask your nurse or doctor.          amLODipine 10 MG tablet Commonly known as: NORVASC Take 1 tablet (10 mg total) by mouth daily.   atorvastatin 20 MG tablet Commonly known as: LIPITOR Take 1 tablet (20 mg total) by mouth daily.   famotidine 20 MG tablet Commonly known as: PEPCID Take 1 tablet (20 mg total) by mouth 2 (two) times daily.   gabapentin 100 MG capsule Commonly known as: NEURONTIN TAKE ONE CAPSULE BY MOUTH THREE TIMES A DAY   glucose  blood test strip Commonly known as: OneTouch Verio Use as directed to check blood sugars 2 times per day dx: e11.65   Ozempic (1 MG/DOSE) 4 MG/3ML Sopn Generic drug: Semaglutide (1 MG/DOSE) Inject 1 mg into the skin once a week.   telmisartan 20 MG tablet Commonly known as: MICARDIS TAKE ONE TABLET BY MOUTH DAILY   Tresiba FlexTouch 200 UNIT/ML FlexTouch Pen Generic drug: insulin degludec Inject 34 Units into the skin daily in the afternoon.   VITAMIN C PO Take by mouth.   Vitamin D (Ergocalciferol) 1.25 MG (50000 UNIT) Caps capsule Commonly known as: DRISDOL TAKE ONE CAPSULE BY MOUTH ONCE WEEKLY   Xigduo XR 01-999 MG Tb24 Generic drug: Dapagliflozin-metFORMIN HCl ER Take 2 tablets by mouth daily.   ZINCATE PO Take by mouth.         ALLERGIES: Allergies  Allergen Reactions   Lisinopril Cough   Hydrochlorothiazide Other (See Comments)    pancreatitis     REVIEW OF SYSTEMS: A comprehensive ROS was conducted with the patient and is negative except as per HPI and below:  Review of Systems  Gastrointestinal:  Negative for diarrhea and nausea.  Genitourinary:  Positive for frequency.  Endo/Heme/Allergies:  Negative for polydipsia.     OBJECTIVE:   VITAL SIGNS:BP 140/86 (BP Location: Left Arm, Patient Position: Sitting, Cuff Size: Small)    Pulse 84    Ht 5' 1.8" (1.57 m)    Wt 167 lb (75.8 kg)    SpO2 99%    BMI 30.74 kg/m    PHYSICAL EXAM:  General: Pt appears well and is in NAD  Neck: General: Supple without adenopathy or carotid bruits. Thyroid: Thyroid size normal.  No goiter or nodules appreciated.  Lungs: Clear with good BS bilat with no rales, rhonchi, or wheezes  Heart: RRR with normal S1 and S2 and no gallops; no murmurs; no rub  Abdomen: Normoactive bowel sounds, soft, nontender, without masses or organomegaly palpable  Extremities:  Lower extremities - No pretibial edema. No lesions.  Neuro: MS is good with appropriate affect, pt is alert and  Ox3    DM foot exam: 06/15/2021  The skin of the feet is intact without sores or ulcerations. The pedal pulses are 2+ on right and 2+ on left. The sensation is decreased  to a screening 5.07, 10 gram monofilament bilaterally   DATA REVIEWED:  Lab Results  Component Value Date   HGBA1C 7.8 (H) 09/05/2021   HGBA1C 8.5 (H) 05/12/2021   HGBA1C 11.2 (H) 03/14/2021   Lab Results  Component Value Date   MICROALBUR 150 12/02/2020   LDLCALC 100 (H) 09/22/2020   CREATININE 1.32 (H) 09/05/2021   Lab Results  Component Value Date  MICRALBCREAT 30-300 12/02/2020    Lab Results  Component Value Date   CHOL 165 09/22/2020   HDL 41 09/22/2020   LDLCALC 100 (H) 09/22/2020   TRIG 136 09/22/2020   CHOLHDL 4.0 09/22/2020        ASSESSMENT / PLAN / RECOMMENDATIONS:   1) Type 2 Diabetes Mellitus, with improving glycemic control , With Neuropathic complications - Most recent A1c of 7.8 %. Goal A1c < 7.0 %.    - A1c down from 8.5%   - Praised pt on the weight loss  - She has not been taking Ozempic due to shortage in supply , I have advised the pt that this should have been resolved by now and to restart it, pt to decrease her Tyler Aas due to a Bg of 74 mg/dL to 32 units but to decrease this further to 26 units once Ozempic is restarted   MEDICATIONS: Continue Xigduo 01-999 mg 1 tablet twice a day  Start Ozempic 1 mg weekly  Decrease Tresiba to 32 units daily   EDUCATION / INSTRUCTIONS: BG monitoring instructions: Patient is instructed to check her blood sugars 1 times a day, fasting . Call [facility] if: BG persistently < 70  I reviewed the Rule of 15 for the treatment of hypoglycemia in detail with the patient. Literature supplied.   2) Diabetic complications:  Eye: Does not have known diabetic retinopathy.  Neuro/ Feet: Does  have known diabetic peripheral neuropathy based on exam 9/21/202 Renal: Patient does not have known baseline CKD. She is  on an ACEI/ARB  at present.  3) Dyslipidemia:    - LDL above goal at 100 mg/dL , We switched pravastatin to  Atorvastatin  - Discussed cardiovascular benefits of statin therapy  - Tolerating well.   Medication   Continue  Atorvastatin 20 mg daily     F/U in 4 months    Signed electronically by: Mack Guise, MD  [facility]  [address] [place], [place] [address] Phone: [phone] FAX: [phone]   CC: Glendale Chard, [address] [place] [address] [place] [address] Phone: [phone]  Fax: [phone]    Return to Endocrinology clinic as below: Future Appointments  Date Time Provider [place]  09/15/2021  2:20 PM Alexios Keown, Melanie Crazier, MD [facility] None  09/20/2021  8:30 AM [facility] None  10/31/2021  8:40 AM Skeet Latch, MD [facility]  12/12/2021  3:00 PM Glendale Chard, MD [facility] None

## 2021-09-15 NOTE — Patient Instructions (Addendum)
Keep up the Good Work ! Continue Xigduo 01-999 mg 1 tablet twice a day  Restart Ozempic 1 mg weekly  Decrease Tresiba to 32 units daily  - If you start Ozempic , reduce insulin to 26 units daily    HOW TO TREAT LOW BLOOD SUGARS (Blood sugar LESS THAN 70 MG/DL) Please follow the RULE OF 15 for the treatment of hypoglycemia treatment (when your (blood sugars are less than 70 mg/dL)   STEP 1: Take 15 grams of carbohydrates when your blood sugar is low, which includes:  3-4 GLUCOSE TABS  OR 3-4 OZ OF JUICE OR REGULAR SODA OR ONE TUBE OF GLUCOSE GEL    STEP 2: RECHECK blood sugar in 15 MINUTES STEP 3: If your blood sugar is still low at the 15 minute recheck --> then, go back to STEP 1 and treat AGAIN with another 15 grams of carbohydrates.

## 2021-09-16 ENCOUNTER — Encounter: Payer: Self-pay | Admitting: Internal Medicine

## 2021-09-16 MED ORDER — OZEMPIC (1 MG/DOSE) 4 MG/3ML ~~LOC~~ SOPN: 1.0000 mg | PEN_INJECTOR | SUBCUTANEOUS | 2 refills | Status: DC

## 2021-09-16 MED ORDER — XIGDUO XR 5-1000 MG PO TB24: 2.0000 | ORAL_TABLET | Freq: Every day | ORAL | 3 refills | Status: DC

## 2021-09-20 ENCOUNTER — Other Ambulatory Visit: Payer: BC Managed Care – PPO

## 2021-09-20 ENCOUNTER — Other Ambulatory Visit: Payer: Self-pay

## 2021-10-03 ENCOUNTER — Other Ambulatory Visit: Payer: Self-pay

## 2021-10-03 ENCOUNTER — Other Ambulatory Visit: Payer: BC Managed Care – PPO

## 2021-10-03 DIAGNOSIS — Z79899 Other long term (current) drug therapy: Secondary | ICD-10-CM

## 2021-10-04 ENCOUNTER — Encounter: Payer: Self-pay | Admitting: Internal Medicine

## 2021-10-04 LAB — CMP14+EGFR
ALT: 12 IU/L (ref 0–32)
AST: 21 IU/L (ref 0–40)
Albumin/Globulin Ratio: 1.4 (ref 1.2–2.2)
Albumin: 4.3 g/dL (ref 3.8–4.9)
Alkaline Phosphatase: 162 IU/L — ABNORMAL HIGH (ref 44–121)
BUN/Creatinine Ratio: 12 (ref 12–28)
BUN: 17 mg/dL (ref 8–27)
Bilirubin Total: 0.4 mg/dL (ref 0.0–1.2)
CO2: 22 mmol/L (ref 20–29)
Calcium: 9.6 mg/dL (ref 8.7–10.3)
Chloride: 104 mmol/L (ref 96–106)
Creatinine, Ser: 1.46 mg/dL — ABNORMAL HIGH (ref 0.57–1.00)
Globulin, Total: 3 g/dL (ref 1.5–4.5)
Glucose: 69 mg/dL — ABNORMAL LOW (ref 70–99)
Potassium: 4.2 mmol/L (ref 3.5–5.2)
Sodium: 141 mmol/L (ref 134–144)
Total Protein: 7.3 g/dL (ref 6.0–8.5)
eGFR: 41 mL/min/{1.73_m2} — ABNORMAL LOW (ref >59)

## 2021-10-06 ENCOUNTER — Other Ambulatory Visit: Payer: Self-pay | Admitting: Internal Medicine

## 2021-10-31 ENCOUNTER — Encounter (HOSPITAL_BASED_OUTPATIENT_CLINIC_OR_DEPARTMENT_OTHER): Payer: Self-pay | Admitting: Cardiovascular Disease

## 2021-10-31 ENCOUNTER — Ambulatory Visit (INDEPENDENT_AMBULATORY_CARE_PROVIDER_SITE_OTHER): Payer: BC Managed Care – PPO | Admitting: Cardiovascular Disease

## 2021-10-31 ENCOUNTER — Other Ambulatory Visit: Payer: Self-pay

## 2021-10-31 DIAGNOSIS — I10 Essential (primary) hypertension: Secondary | ICD-10-CM

## 2021-10-31 DIAGNOSIS — E782 Mixed hyperlipidemia: Secondary | ICD-10-CM | POA: Diagnosis not present

## 2021-10-31 DIAGNOSIS — I209 Angina pectoris, unspecified: Secondary | ICD-10-CM

## 2021-10-31 MED ORDER — NITROGLYCERIN 0.4 MG SL SUBL: 0.4000 mg | SUBLINGUAL_TABLET | SUBLINGUAL | 99 refills | Status: AC | PRN

## 2021-10-31 NOTE — Patient Instructions (Addendum)
Medication Instructions:  START ASPIRIN 81 MG DAILY   Use your NTG under your tongue for recurrent chest pain. May take one tablet every 5 minutes. If you are still having discomfort after 3 tablets in 15 minutes, call 911.   *If you need a refill on your cardiac medications before your next appointment, please call your pharmacy*  Lab Work: FASTING LP/CMET TOMORROW OR Wednesday MORNING   If you have labs (blood work) drawn today and your tests are completely normal, you will receive your results only by: [place] (if you have MyChart) OR A paper copy in the mail If you have any lab test that is abnormal or we need to change your treatment, we will call you to review the results.  Testing/Procedures: Your physician has requested that you have a cardiac catheterization. Cardiac catheterization is used to diagnose and/or treat various heart conditions. Doctors may recommend this procedure for a number of different reasons. The most common reason is to evaluate chest pain. Chest pain can be a symptom of coronary artery disease (CAD), and cardiac catheterization can show whether plaque is narrowing or blocking your hearts arteries. This procedure is also used to evaluate the valves, as well as measure the blood flow and oxygen levels in different parts of your heart. For further information please visit [email or web address]. Please follow instruction sheet, as given.  Follow-Up: At [facility], you and your health needs are our priority.  As part of our continuing mission to provide you with exceptional heart care, we have created designated Provider Care Teams.  These Care Teams include your primary Cardiologist (physician) and Advanced Practice Providers (APPs -  Physician Assistants and Nurse Practitioners) who all work together to provide you with the care you need, when you need it.  We recommend signing up for the patient portal called "MyChart".  Sign up information is provided on  this After Visit Summary.  MyChart is used to connect with patients for Virtual Visits (Telemedicine).  Patients are able to view lab/test results, encounter notes, upcoming appointments, etc.  Non-urgent messages can be sent to your provider as well.   To learn more about what you can do with MyChart, go to [email or web address].    Your next appointment:   1 month(s)  The format for your next appointment:   In Person  Provider:   Skeet Latch, MD, Laurann [place], NP, or Coletta Memos, NP     Other Instructions   [address] [address] Dept: [phone]  SRIYA KROEZE  10/31/2021  You are scheduled for a Cardiac Catheterization on Thursday, February 9 with Dr. Harrell Gave End.  1. Please arrive at the [facility] (Main Entrance A) at [facility]: [place], [place] [address] at 7:00 AM  Free [place] parking service is available.   Special note: Every effort is made to have your procedure done on time. Please understand that emergencies sometimes delay scheduled procedures.  2. Diet: Do not eat solid foods after midnight.  The patient may have clear liquids until 5am upon the day of the procedure.  3. Labs: You will need to have blood drawn on Tuesday, February 7 at [facility] at [place]:  [address] [place], [place] [address] (lab is in the Primary Care office located on the 3rd floor).  Hours: 8:00 am - 4:30 pm (avoid 12:00 pm - 1:00 pm)  Phone: [phone].  Tell staff you are there for blood work and they will direct you to the lab.  You do not need an appointment. You do not need to be fasting.  4. Medication instructions in preparation for your procedure:   Contrast Allergy: No   Take only 1/2 units of insulin the night before your procedure. Do not take any insulin on the day of the procedure.  Do not  take Diabetes Med XIGDUO on the day of the procedure and HOLD 48 HOURS AFTER THE PROCEDURE.  On the morning of your procedure, take your Aspirin and any morning medicines NOT listed above.  You may use sips of water.  5. Plan for one night stay--bring personal belongings. 6. Bring a current list of your medications and current insurance cards. 7. You MUST have a responsible person to drive you home. 8. Someone MUST be with you the first 24 hours after you arrive home or your discharge will be delayed. 9. Please wear clothes that are easy to get on and off and wear slip-on shoes.  Thank you for allowing [place] to care for you!   -- [facility]

## 2021-10-31 NOTE — Assessment & Plan Note (Signed)
Blood pressure is well-controlled on amlodipine and telmisartan.

## 2021-10-31 NOTE — Assessment & Plan Note (Signed)
She has atherosclerosis of the aorta.  LDL goal is less than 70.  She will come back for fasting lipids and a CMP.

## 2021-10-31 NOTE — Progress Notes (Signed)
Cardiology Office Note   Date:  10/31/2021   ID:  Teresa Grant, DOB 05/17/1961, MRN [id number]  PCP:  Glendale Chard, MD  Cardiologist:   Skeet Latch, MD   Chief Complaint  Patient presents with   Chest Pain    Pt c/o chest pain last night      History of Present Illness: Teresa Grant is a 61 y.o. female with hypertension, diabetes, hyperlipidemia, and family history of CAD who is being seen today for the evaluation of chest pain at the request of Glendale Chard, MD. she saw Dr. Baird Cancer 08/2021 and reported chest discomfort that she thought was likely GI related.  However given her cardiovascular risk factors she was referred to cardiology.  EKG at that time was unremarkable.   She has noted a pain that starts sharp in the sternum and radiates under her L breast.  The pain then feels achy.  It also makes her short of breath.  It sometimes starts when she bends over to pick something up.  It sometimes make her feel lightheaded like she will pass out.  It also occur when walking sometimes.  She wonders if it is due to acid reflux because it also occurs sometimes when lying down.  She was started taking Pepcid at night but hasn't noted any improvement.  No nausea but she did have an episode of diaphoresis while at work.   The episodes last up to 1.5 hours off and on.  She has intermittent LE edema that improves with elevation.  She has no orthopnea or PND.  She had an echo in 2015 that revealed LVEF 60 to 65%.  She notes that she had some imaging recently that showed she had plaque in her aorta.   Past Medical History:  Diagnosis Date   Diabetes mellitus    Dysrhythmia    1985   Heart murmur    HLD (hyperlipidemia)    Hypertension    Kidney stone on right side 03/2013   Pneumonia    7 yrs ago    Past Surgical History:  Procedure Laterality Date   ABDOMINAL HYSTERECTOMY  2001   BACK SURGERY     CERVICAL SPINE SURGERY  06/06/2019   Dr. Christella Noa, performed at surgical  center   CESAREAN SECTION  1990   COLONOSCOPY     Dr. Deatra Ina normal   LUMBAR LAMINECTOMY/DECOMPRESSION MICRODISCECTOMY  03/20/2012   Procedure: LUMBAR LAMINECTOMY/DECOMPRESSION MICRODISCECTOMY;  Surgeon: Sinclair Ship, MD;  Location: [place];  Service: Orthopedics;  Laterality: Left;  Left sided lumbar 4-5 microdisectomy   SPINE SURGERY  2013     Current Outpatient Medications  Medication Sig Dispense Refill   amLODipine (NORVASC) 10 MG tablet Take 1 tablet (10 mg total) by mouth daily. 90 tablet 1   Ascorbic Acid (VITAMIN C PO) Take by mouth.     aspirin EC 81 MG tablet Take 81 mg by mouth daily. Swallow whole.     atorvastatin (LIPITOR) 20 MG tablet Take 1 tablet (20 mg total) by mouth daily. 90 tablet 3   Dapagliflozin-metFORMIN HCl ER (XIGDUO XR) 01-999 MG TB24 Take 2 tablets by mouth daily. 180 tablet 3   famotidine (PEPCID) 20 MG tablet TAKE ONE TABLET BY MOUTH TWICE A DAY 60 tablet 1   gabapentin (NEURONTIN) 100 MG capsule TAKE ONE CAPSULE BY MOUTH THREE TIMES A DAY 270 capsule 1   glucose blood (ONETOUCH VERIO) test strip Use as directed to check blood sugars 2 times  per day dx: e11.65 100 each 10   insulin degludec (TRESIBA FLEXTOUCH) 200 UNIT/ML FlexTouch Pen Inject 34 Units into the skin daily in the afternoon. 15 mL 6   nitroGLYCERIN (NITROSTAT) 0.4 MG SL tablet Place 1 tablet (0.4 mg total) under the tongue every 5 (five) minutes as needed for chest pain. UP TO 3 TABLETS 25 tablet PRN   Semaglutide, 1 MG/DOSE, (OZEMPIC, 1 MG/DOSE,) 4 MG/3ML SOPN Inject 1 mg into the skin once a week. 9 mL 2   telmisartan (MICARDIS) 20 MG tablet TAKE ONE TABLET BY MOUTH DAILY 90 tablet 1   Vitamin D, Ergocalciferol, (DRISDOL) 1.25 MG (50000 UNIT) CAPS capsule TAKE ONE CAPSULE BY MOUTH ONCE WEEKLY 12 capsule 1   Zinc Sulfate (ZINCATE PO) Take by mouth.     No current facility-administered medications for this visit.    Allergies:   Lisinopril, Atorvastatin, Glipizide, and  Hydrochlorothiazide    Social History:  The patient  reports that she quit smoking about 17 years ago. Her smoking use included cigarettes. She has a 12.50 pack-year smoking history. She has never used smokeless tobacco. She reports current alcohol use. She reports that she does not use drugs.   Family History:  The patient's family history includes Colon cancer in her cousin; Diabetes in her paternal aunt and paternal uncle; Esophageal cancer in her maternal uncle; Heart attack (age of onset: 30) in her mother; Heart disease in her mother; Hypertension in her brother, father, maternal aunt, maternal uncle, mother, and sister; Kidney disease in her father; Stroke in her mother.    ROS:  Please see the history of present illness.   Otherwise, review of systems are positive for none.   All other systems are reviewed and negative.    PHYSICAL EXAM: VS:  BP 122/76 (BP Location: Left Arm, Patient Position: Sitting, Cuff Size: Normal)    Pulse 84    Ht 5' 1.8" (1.57 m)    Wt 163 lb 9.6 oz (74.2 kg)    SpO2 97%    BMI 30.12 kg/m  , BMI Body mass index is 30.12 kg/m. GENERAL:  Well appearing HEENT:  Pupils equal round and reactive, fundi not visualized, oral mucosa unremarkable NECK:  No jugular venous distention, waveform within normal limits, carotid upstroke brisk and symmetric, no bruits, no thyromegaly LUNGS:  Clear to auscultation bilaterally HEART:  RRR.  PMI not displaced or sustained,S1 and S2 within normal limits, no S3, no S4, no clicks, no rubs, no murmurs ABD:  Flat, positive bowel sounds normal in frequency in pitch, no bruits, no rebound, no guarding, no midline pulsatile mass, no hepatomegaly, no splenomegaly EXT:  2 plus pulses throughout, no edema, no cyanosis no clubbing SKIN:  No rashes no nodules NEURO:  Cranial nerves II through XII grossly intact, motor grossly intact throughout PSYCH:  Cognitively intact, oriented to person place and time    EKG:  EKG is ordered  today. The ekg ordered today demonstrates sinus rhythm.  Rate 84 bpm.  Echo 07/11/2014: Study Conclusions   - Left ventricle: The cavity size was normal. There was mild focal    basal hypertrophy of the septum. Systolic function was normal.    The estimated ejection fraction was in the range of 60% to 65%.    Images were inadequate for LV wall motion assessment.   Recent Labs: 12/02/2020: Hemoglobin 12.3; Platelets 273 10/03/2021: ALT 12; BUN 17; Creatinine, Ser 1.46; Potassium 4.2; Sodium 141    Lipid Panel  Component Value Date/Time   CHOL 165 09/22/2020 1455   TRIG 136 09/22/2020 1455   HDL 41 09/22/2020 1455   CHOLHDL 4.0 09/22/2020 1455   CHOLHDL 2.8 09/13/2012 1156   VLDL 14 09/13/2012 1156   LDLCALC 100 (H) 09/22/2020 1455      Wt Readings from Last 3 Encounters:  10/31/21 163 lb 9.6 oz (74.2 kg)  09/15/21 167 lb (75.8 kg)  09/05/21 166 lb (75.3 kg)      ASSESSMENT AND PLAN:  Essential hypertension Blood pressure is well-controlled on amlodipine and telmisartan.  Angina pectoris ([place]) Ms. Diles reports exertional chest pain.  She also has a family history of CAD.  Her mother had a heart attack in her 47s and died.  She has aortic atherosclerosis.  Her symptoms are concerning for angina and seem to be getting worse.  We will go ahead and set her up for cardiac catheterization.  We will start her on aspirin 81 mg daily.  We will also give her nitroglycerin.  She was instructed to sit down if she needs to take 1 and that she can take 1 tablet every 5 minutes as needed.  If she continues to have chest pain after that she needs to go to the emergency department.  Given her renal dysfunction she will go to the Cath Lab for precath hydration.  Risks and benefits of cardiac catheterization have been discussed with the patient.  The patient understands that risks included but are not limited to stroke (1 in 1000), death (1 in 56), kidney failure [usually temporary] (1  in 500), bleeding (1 in 200), allergic reaction [possibly serious] (1 in 200). The patient understands and agrees to proceed.    Hyperlipidemia She has atherosclerosis of the aorta.  LDL goal is less than 70.  She will come back for fasting lipids and a CMP.    Current medicines are reviewed at length with the patient today.  The patient does not have concerns regarding medicines.  The following changes have been made: Start aspirin and nitroglycerin  Labs/ tests ordered today include:   Orders Placed This Encounter  Procedures   CBC with Differential/Platelet   Lipid panel   Comprehensive metabolic panel   EKG [id number]     Disposition:   FU with Eason Housman C. Oval Linsey, MD, [facility] in 1 month     Signed, Caileigh Canche C. Oval Linsey, MD, [facility]  10/31/2021 12:18 PM    [place]

## 2021-10-31 NOTE — H&P (View-Only) (Signed)
Cardiology Office Note   Date:  10/31/2021   ID:  POLLY BARNER, DOB Apr 02, 1961, MRN [id number]  PCP:  Glendale Chard, MD  Cardiologist:   Skeet Latch, MD   Chief Complaint  Patient presents with   Chest Pain    Pt c/o chest pain last night      History of Present Illness: Teresa Grant is a 61 y.o. female with hypertension, diabetes, hyperlipidemia, and family history of CAD who is being seen today for the evaluation of chest pain at the request of Glendale Chard, MD. she saw Dr. Baird Cancer 08/2021 and reported chest discomfort that she thought was likely GI related.  However given her cardiovascular risk factors she was referred to cardiology.  EKG at that time was unremarkable.   She has noted a pain that starts sharp in the sternum and radiates under her L breast.  The pain then feels achy.  It also makes her short of breath.  It sometimes starts when she bends over to pick something up.  It sometimes make her feel lightheaded like she will pass out.  It also occur when walking sometimes.  She wonders if it is due to acid reflux because it also occurs sometimes when lying down.  She was started taking Pepcid at night but hasn't noted any improvement.  No nausea but she did have an episode of diaphoresis while at work.   The episodes last up to 1.5 hours off and on.  She has intermittent LE edema that improves with elevation.  She has no orthopnea or PND.  She had an echo in 2015 that revealed LVEF 60 to 65%.  She notes that she had some imaging recently that showed she had plaque in her aorta.   Past Medical History:  Diagnosis Date   Diabetes mellitus    Dysrhythmia    1985   Heart murmur    HLD (hyperlipidemia)    Hypertension    Kidney stone on right side 03/2013   Pneumonia    7 yrs ago    Past Surgical History:  Procedure Laterality Date   ABDOMINAL HYSTERECTOMY  2001   BACK SURGERY     CERVICAL SPINE SURGERY  06/06/2019   Dr. Christella Noa, performed at surgical  center   CESAREAN SECTION  1990   COLONOSCOPY     Dr. Deatra Ina normal   LUMBAR LAMINECTOMY/DECOMPRESSION MICRODISCECTOMY  03/20/2012   Procedure: LUMBAR LAMINECTOMY/DECOMPRESSION MICRODISCECTOMY;  Surgeon: Sinclair Ship, MD;  Location: [place];  Service: Orthopedics;  Laterality: Left;  Left sided lumbar 4-5 microdisectomy   SPINE SURGERY  2013     Current Outpatient Medications  Medication Sig Dispense Refill   amLODipine (NORVASC) 10 MG tablet Take 1 tablet (10 mg total) by mouth daily. 90 tablet 1   Ascorbic Acid (VITAMIN C PO) Take by mouth.     aspirin EC 81 MG tablet Take 81 mg by mouth daily. Swallow whole.     atorvastatin (LIPITOR) 20 MG tablet Take 1 tablet (20 mg total) by mouth daily. 90 tablet 3   Dapagliflozin-metFORMIN HCl ER (XIGDUO XR) 01-999 MG TB24 Take 2 tablets by mouth daily. 180 tablet 3   famotidine (PEPCID) 20 MG tablet TAKE ONE TABLET BY MOUTH TWICE A DAY 60 tablet 1   gabapentin (NEURONTIN) 100 MG capsule TAKE ONE CAPSULE BY MOUTH THREE TIMES A DAY 270 capsule 1   glucose blood (ONETOUCH VERIO) test strip Use as directed to check blood sugars 2 times  per day dx: e11.65 100 each 10   insulin degludec (TRESIBA FLEXTOUCH) 200 UNIT/ML FlexTouch Pen Inject 34 Units into the skin daily in the afternoon. 15 mL 6   nitroGLYCERIN (NITROSTAT) 0.4 MG SL tablet Place 1 tablet (0.4 mg total) under the tongue every 5 (five) minutes as needed for chest pain. UP TO 3 TABLETS 25 tablet PRN   Semaglutide, 1 MG/DOSE, (OZEMPIC, 1 MG/DOSE,) 4 MG/3ML SOPN Inject 1 mg into the skin once a week. 9 mL 2   telmisartan (MICARDIS) 20 MG tablet TAKE ONE TABLET BY MOUTH DAILY 90 tablet 1   Vitamin D, Ergocalciferol, (DRISDOL) 1.25 MG (50000 UNIT) CAPS capsule TAKE ONE CAPSULE BY MOUTH ONCE WEEKLY 12 capsule 1   Zinc Sulfate (ZINCATE PO) Take by mouth.     No current facility-administered medications for this visit.    Allergies:   Lisinopril, Atorvastatin, Glipizide, and  Hydrochlorothiazide    Social History:  The patient  reports that she quit smoking about 17 years ago. Her smoking use included cigarettes. She has a 12.50 pack-year smoking history. She has never used smokeless tobacco. She reports current alcohol use. She reports that she does not use drugs.   Family History:  The patient's family history includes Colon cancer in her cousin; Diabetes in her paternal aunt and paternal uncle; Esophageal cancer in her maternal uncle; Heart attack (age of onset: 8) in her mother; Heart disease in her mother; Hypertension in her brother, father, maternal aunt, maternal uncle, mother, and sister; Kidney disease in her father; Stroke in her mother.    ROS:  Please see the history of present illness.   Otherwise, review of systems are positive for none.   All other systems are reviewed and negative.    PHYSICAL EXAM: VS:  BP 122/76 (BP Location: Left Arm, Patient Position: Sitting, Cuff Size: Normal)    Pulse 84    Ht 5' 1.8" (1.57 m)    Wt 163 lb 9.6 oz (74.2 kg)    SpO2 97%    BMI 30.12 kg/m  , BMI Body mass index is 30.12 kg/m. GENERAL:  Well appearing HEENT:  Pupils equal round and reactive, fundi not visualized, oral mucosa unremarkable NECK:  No jugular venous distention, waveform within normal limits, carotid upstroke brisk and symmetric, no bruits, no thyromegaly LUNGS:  Clear to auscultation bilaterally HEART:  RRR.  PMI not displaced or sustained,S1 and S2 within normal limits, no S3, no S4, no clicks, no rubs, no murmurs ABD:  Flat, positive bowel sounds normal in frequency in pitch, no bruits, no rebound, no guarding, no midline pulsatile mass, no hepatomegaly, no splenomegaly EXT:  2 plus pulses throughout, no edema, no cyanosis no clubbing SKIN:  No rashes no nodules NEURO:  Cranial nerves II through XII grossly intact, motor grossly intact throughout PSYCH:  Cognitively intact, oriented to person place and time    EKG:  EKG is ordered  today. The ekg ordered today demonstrates sinus rhythm.  Rate 84 bpm.  Echo 07/11/2014: Study Conclusions   - Left ventricle: The cavity size was normal. There was mild focal    basal hypertrophy of the septum. Systolic function was normal.    The estimated ejection fraction was in the range of 60% to 65%.    Images were inadequate for LV wall motion assessment.   Recent Labs: 12/02/2020: Hemoglobin 12.3; Platelets 273 10/03/2021: ALT 12; BUN 17; Creatinine, Ser 1.46; Potassium 4.2; Sodium 141    Lipid Panel  Component Value Date/Time   CHOL 165 09/22/2020 1455   TRIG 136 09/22/2020 1455   HDL 41 09/22/2020 1455   CHOLHDL 4.0 09/22/2020 1455   CHOLHDL 2.8 09/13/2012 1156   VLDL 14 09/13/2012 1156   LDLCALC 100 (H) 09/22/2020 1455      Wt Readings from Last 3 Encounters:  10/31/21 163 lb 9.6 oz (74.2 kg)  09/15/21 167 lb (75.8 kg)  09/05/21 166 lb (75.3 kg)      ASSESSMENT AND PLAN:  Essential hypertension Blood pressure is well-controlled on amlodipine and telmisartan.  Angina pectoris ([place]) Ms. Linares reports exertional chest pain.  She also has a family history of CAD.  Her mother had a heart attack in her 82s and died.  She has aortic atherosclerosis.  Her symptoms are concerning for angina and seem to be getting worse.  We will go ahead and set her up for cardiac catheterization.  We will start her on aspirin 81 mg daily.  We will also give her nitroglycerin.  She was instructed to sit down if she needs to take 1 and that she can take 1 tablet every 5 minutes as needed.  If she continues to have chest pain after that she needs to go to the emergency department.  Given her renal dysfunction she will go to the Cath Lab for precath hydration.  Risks and benefits of cardiac catheterization have been discussed with the patient.  The patient understands that risks included but are not limited to stroke (1 in 1000), death (1 in 58), kidney failure [usually temporary] (1  in 500), bleeding (1 in 200), allergic reaction [possibly serious] (1 in 200). The patient understands and agrees to proceed.    Hyperlipidemia She has atherosclerosis of the aorta.  LDL goal is less than 70.  She will come back for fasting lipids and a CMP.    Current medicines are reviewed at length with the patient today.  The patient does not have concerns regarding medicines.  The following changes have been made: Start aspirin and nitroglycerin  Labs/ tests ordered today include:   Orders Placed This Encounter  Procedures   CBC with Differential/Platelet   Lipid panel   Comprehensive metabolic panel   EKG [id number]     Disposition:   FU with Teresa Bixler C. Oval Linsey, MD, [facility] in 1 month     Signed, Carolene Gitto C. Oval Linsey, MD, [facility]  10/31/2021 12:18 PM    [place]

## 2021-10-31 NOTE — Assessment & Plan Note (Signed)
Teresa Grant reports exertional chest pain.  She also has a family history of CAD.  Her mother had a heart attack in her 62s and died.  She has aortic atherosclerosis.  Her symptoms are concerning for angina and seem to be getting worse.  We will go ahead and set her up for cardiac catheterization.  We will start her on aspirin 81 mg daily.  We will also give her nitroglycerin.  She was instructed to sit down if she needs to take 1 and that she can take 1 tablet every 5 minutes as needed.  If she continues to have chest pain after that she needs to go to the emergency department.  Given her renal dysfunction she will go to the Cath Lab for precath hydration.  Risks and benefits of cardiac catheterization have been discussed with the patient.  The patient understands that risks included but are not limited to stroke (1 in 1000), death (1 in 6), kidney failure [usually temporary] (1 in 500), bleeding (1 in 200), allergic reaction [possibly serious] (1 in 200). The patient understands and agrees to proceed.

## 2021-11-02 ENCOUNTER — Telehealth: Payer: Self-pay | Admitting: *Deleted

## 2021-11-02 LAB — CBC WITH DIFFERENTIAL/PLATELET
Basophils Absolute: 0 10*3/uL (ref 0.0–0.2)
Basos: 1 %
EOS (ABSOLUTE): 0.1 10*3/uL (ref 0.0–0.4)
Eos: 2 %
Hematocrit: 38.5 % (ref 34.0–46.6)
Hemoglobin: 12.5 g/dL (ref 11.1–15.9)
Immature Grans (Abs): 0 10*3/uL (ref 0.0–0.1)
Immature Granulocytes: 0 %
Lymphocytes Absolute: 2.8 10*3/uL (ref 0.7–3.1)
Lymphs: 42 %
MCH: 26.7 pg (ref 26.6–33.0)
MCHC: 32.5 g/dL (ref 31.5–35.7)
MCV: 82 fL (ref 79–97)
Monocytes Absolute: 0.4 10*3/uL (ref 0.1–0.9)
Monocytes: 6 %
Neutrophils Absolute: 3.2 10*3/uL (ref 1.4–7.0)
Neutrophils: 49 %
Platelets: 264 10*3/uL (ref 150–450)
RBC: 4.68 x10E6/uL (ref 3.77–5.28)
RDW: 13.8 % (ref 11.7–15.4)
WBC: 6.7 10*3/uL (ref 3.4–10.8)

## 2021-11-02 LAB — LIPID PANEL
Chol/HDL Ratio: 3.4 ratio (ref 0.0–4.4)
Cholesterol, Total: 122 mg/dL (ref 100–199)
HDL: 36 mg/dL — ABNORMAL LOW (ref >39)
LDL Chol Calc (NIH): 65 mg/dL (ref 0–99)
Triglycerides: 116 mg/dL (ref 0–149)
VLDL Cholesterol Cal: 21 mg/dL (ref 5–40)

## 2021-11-02 LAB — COMPREHENSIVE METABOLIC PANEL
ALT: 12 IU/L (ref 0–32)
AST: 17 IU/L (ref 0–40)
Albumin/Globulin Ratio: 1.4 (ref 1.2–2.2)
Albumin: 4.2 g/dL (ref 3.8–4.9)
Alkaline Phosphatase: 160 IU/L — ABNORMAL HIGH (ref 44–121)
BUN/Creatinine Ratio: 14 (ref 12–28)
BUN: 17 mg/dL (ref 8–27)
Bilirubin Total: 0.5 mg/dL (ref 0.0–1.2)
CO2: 21 mmol/L (ref 20–29)
Calcium: 9.5 mg/dL (ref 8.7–10.3)
Chloride: 107 mmol/L — ABNORMAL HIGH (ref 96–106)
Creatinine, Ser: 1.25 mg/dL — ABNORMAL HIGH (ref 0.57–1.00)
Globulin, Total: 2.9 g/dL (ref 1.5–4.5)
Glucose: 95 mg/dL (ref 70–99)
Potassium: 4.2 mmol/L (ref 3.5–5.2)
Sodium: 143 mmol/L (ref 134–144)
Total Protein: 7.1 g/dL (ref 6.0–8.5)
eGFR: 49 mL/min/{1.73_m2} — ABNORMAL LOW (ref >59)

## 2021-11-02 NOTE — Telephone Encounter (Signed)
Call placed to patient to review procedure instructions, no answer, voicemail message. °

## 2021-11-02 NOTE — Telephone Encounter (Signed)
Reviewed procedure instructions with patient.  

## 2021-11-02 NOTE — Telephone Encounter (Addendum)
Cardiac catheterization scheduled at [facility] for: Thursday November 03, 2021 [facility] Main Entrance A [facility] at: 7 AM-pre-procedure hydration   Diet-no solid food after midnight prior to cath, clear liquids until 5 AM day of procedure.  Medication instructions for procedure: -Hold:  Xigduo-day of procedure and 48 hours post procedure  Treshiba-1/2 usual dose HS prior to procedure  Micardis-day before and day of procedure-per protocol-GFR 28  Ozempic-weekly on Wednesdays -Except hold medications usual morning medications can be taken pre-cath with sips of water including aspirin 81 mg.    Must have responsible adult to drive home post procedure and be with patient first 24 hours after arriving home.  [facility] does allow one visitor to accompany you and wait in the hospital waiting room while you are there for your procedure. You and your visitor will be asked to wear a mask once you enter the hospital.   Patient reports does not currently have any new symptoms concerning for COVID-19 and no household members with COVID-19 like illness.    Reviewed procedure instructions with patient.

## 2021-11-03 ENCOUNTER — Encounter (HOSPITAL_COMMUNITY)
Admission: RE | Disposition: A | Discharge status: Home / Self Care | Payer: Self-pay | Source: Home / Self Care | Attending: Internal Medicine

## 2021-11-03 ENCOUNTER — Ambulatory Visit (HOSPITAL_COMMUNITY)
Admission: RE | Admit: 2021-11-03 | Discharge: 2021-11-03 | Disposition: A | Discharge status: Home / Self Care | Payer: BC Managed Care – PPO | Attending: Internal Medicine | Admitting: Internal Medicine

## 2021-11-03 DIAGNOSIS — E785 Hyperlipidemia, unspecified: Secondary | ICD-10-CM | POA: Insufficient documentation

## 2021-11-03 DIAGNOSIS — I129 Hypertensive chronic kidney disease with stage 1 through stage 4 chronic kidney disease, or unspecified chronic kidney disease: Secondary | ICD-10-CM | POA: Insufficient documentation

## 2021-11-03 DIAGNOSIS — Z7984 Long term (current) use of oral hypoglycemic drugs: Secondary | ICD-10-CM | POA: Diagnosis not present

## 2021-11-03 DIAGNOSIS — Z8249 Family history of ischemic heart disease and other diseases of the circulatory system: Secondary | ICD-10-CM | POA: Insufficient documentation

## 2021-11-03 DIAGNOSIS — Z87891 Personal history of nicotine dependence: Secondary | ICD-10-CM | POA: Diagnosis not present

## 2021-11-03 DIAGNOSIS — Z794 Long term (current) use of insulin: Secondary | ICD-10-CM | POA: Diagnosis not present

## 2021-11-03 DIAGNOSIS — I208 Other forms of angina pectoris: Secondary | ICD-10-CM | POA: Diagnosis not present

## 2021-11-03 DIAGNOSIS — I700 Atherosclerosis of aorta: Secondary | ICD-10-CM | POA: Diagnosis not present

## 2021-11-03 DIAGNOSIS — N183 Chronic kidney disease, stage 3 unspecified: Secondary | ICD-10-CM | POA: Diagnosis not present

## 2021-11-03 DIAGNOSIS — E1122 Type 2 diabetes mellitus with diabetic chronic kidney disease: Secondary | ICD-10-CM | POA: Diagnosis not present

## 2021-11-03 DIAGNOSIS — Z7985 Long-term (current) use of injectable non-insulin antidiabetic drugs: Secondary | ICD-10-CM | POA: Diagnosis not present

## 2021-11-03 HISTORY — PX: LEFT HEART CATH AND CORONARY ANGIOGRAPHY: CATH118249

## 2021-11-03 LAB — GLUCOSE, CAPILLARY
Glucose-Capillary: 74 mg/dL (ref 70–99)
Glucose-Capillary: 61 mg/dL — ABNORMAL LOW (ref 70–99)

## 2021-11-03 SURGERY — LEFT HEART CATH AND CORONARY ANGIOGRAPHY
Anesthesia: LOCAL

## 2021-11-03 MED ORDER — FENTANYL CITRATE (PF) 100 MCG/2ML IJ SOLN
INTRAMUSCULAR | Status: DC | PRN
  Administered 2021-11-03: 50 ug via INTRAVENOUS

## 2021-11-03 MED ORDER — SODIUM CHLORIDE 0.9 % IV SOLN: 250.0000 mL | INTRAVENOUS | Status: DC | PRN

## 2021-11-03 MED ORDER — LIDOCAINE HCL (PF) 1 % IJ SOLN
INTRAMUSCULAR | Status: DC | PRN
  Administered 2021-11-03: 2 mL via INTRADERMAL

## 2021-11-03 MED ORDER — SODIUM CHLORIDE 0.9 % WEIGHT BASED INFUSION
3.0000 mL/kg/h | INTRAVENOUS | Status: AC
  Administered 2021-11-03: 3 mL/kg/h via INTRAVENOUS

## 2021-11-03 MED ORDER — MIDAZOLAM HCL 2 MG/2ML IJ SOLN
INTRAMUSCULAR | Status: DC | PRN
  Administered 2021-11-03: 1 mg via INTRAVENOUS

## 2021-11-03 MED ORDER — HEPARIN SODIUM (PORCINE) 1000 UNIT/ML IJ SOLN
INTRAMUSCULAR | Status: AC
  Filled 2021-11-03: qty 10

## 2021-11-03 MED ORDER — MIDAZOLAM HCL 2 MG/2ML IJ SOLN
INTRAMUSCULAR | Status: AC
  Filled 2021-11-03: qty 2

## 2021-11-03 MED ORDER — HEPARIN (PORCINE) IN NACL 1000-0.9 UT/500ML-% IV SOLN
INTRAVENOUS | Status: AC
  Filled 2021-11-03: qty 1000

## 2021-11-03 MED ORDER — SODIUM CHLORIDE 0.9% FLUSH: 3.0000 mL | INTRAVENOUS | Status: DC | PRN

## 2021-11-03 MED ORDER — LABETALOL HCL 5 MG/ML IV SOLN: 10.0000 mg | INTRAVENOUS | Status: DC | PRN

## 2021-11-03 MED ORDER — DEXTROSE 50 % IV SOLN
INTRAVENOUS | Status: AC
  Filled 2021-11-03: qty 50

## 2021-11-03 MED ORDER — HEPARIN SODIUM (PORCINE) 1000 UNIT/ML IJ SOLN
INTRAMUSCULAR | Status: DC | PRN
  Administered 2021-11-03: 3500 [IU] via INTRAVENOUS

## 2021-11-03 MED ORDER — ONDANSETRON HCL 4 MG/2ML IJ SOLN
4.0000 mg | Freq: Four times a day (QID) | INTRAMUSCULAR | Status: DC | PRN
  Administered 2021-11-03: 4 mg via INTRAVENOUS

## 2021-11-03 MED ORDER — FENTANYL CITRATE (PF) 100 MCG/2ML IJ SOLN
INTRAMUSCULAR | Status: AC
  Filled 2021-11-03: qty 2

## 2021-11-03 MED ORDER — SODIUM CHLORIDE 0.9 % IV SOLN: INTRAVENOUS | Status: DC

## 2021-11-03 MED ORDER — IOHEXOL 350 MG/ML SOLN
INTRAVENOUS | Status: DC | PRN
  Administered 2021-11-03: 35 mL

## 2021-11-03 MED ORDER — SODIUM CHLORIDE 0.9% FLUSH: 3.0000 mL | Freq: Two times a day (BID) | INTRAVENOUS | Status: DC

## 2021-11-03 MED ORDER — DEXTROSE 50 % IV SOLN
INTRAVENOUS | Status: DC | PRN
  Administered 2021-11-03: 12.5 g via INTRAVENOUS

## 2021-11-03 MED ORDER — ACETAMINOPHEN 325 MG PO TABS: 650.0000 mg | ORAL_TABLET | ORAL | Status: DC | PRN

## 2021-11-03 MED ORDER — ASPIRIN 81 MG PO CHEW: 81.0000 mg | CHEWABLE_TABLET | ORAL | Status: DC

## 2021-11-03 MED ORDER — HEPARIN (PORCINE) IN NACL 1000-0.9 UT/500ML-% IV SOLN
INTRAVENOUS | Status: DC | PRN
  Administered 2021-11-03 (×2): 500 mL

## 2021-11-03 MED ORDER — XIGDUO XR 5-1000 MG PO TB24: 1.0000 | ORAL_TABLET | Freq: Two times a day (BID) | ORAL | Status: DC

## 2021-11-03 MED ORDER — VERAPAMIL HCL 2.5 MG/ML IV SOLN
INTRAVENOUS | Status: AC
  Filled 2021-11-03: qty 2

## 2021-11-03 MED ORDER — HYDRALAZINE HCL 20 MG/ML IJ SOLN: 10.0000 mg | INTRAMUSCULAR | Status: DC | PRN

## 2021-11-03 MED ORDER — LIDOCAINE HCL (PF) 1 % IJ SOLN
INTRAMUSCULAR | Status: AC
  Filled 2021-11-03: qty 30

## 2021-11-03 MED ORDER — ONDANSETRON HCL 4 MG/2ML IJ SOLN
INTRAMUSCULAR | Status: AC
  Filled 2021-11-03: qty 2

## 2021-11-03 MED ORDER — SODIUM CHLORIDE 0.9 % WEIGHT BASED INFUSION
1.0000 mL/kg/h | INTRAVENOUS | Status: DC
  Administered 2021-11-03: 250 mL via INTRAVENOUS

## 2021-11-03 MED ORDER — VERAPAMIL HCL 2.5 MG/ML IV SOLN
INTRAVENOUS | Status: DC | PRN
  Administered 2021-11-03: 10 mL via INTRA_ARTERIAL

## 2021-11-03 SURGICAL SUPPLY — 11 items
CATH 5FR JL3.5 JR4 ANG PIG MP (CATHETERS) ×1 IMPLANT
CATH LAUNCHER 5F EBU3.0 (CATHETERS) IMPLANT
CATHETER LAUNCHER 5F EBU3.0 (CATHETERS) ×2
DEVICE RAD COMP TR BAND LRG (VASCULAR PRODUCTS) ×1 IMPLANT
GLIDESHEATH SLEND SS 6F .021 (SHEATH) ×1 IMPLANT
GUIDEWIRE INQWIRE 1.5J.035X260 (WIRE) IMPLANT
INQWIRE 1.5J .035X260CM (WIRE) ×2
KIT HEART LEFT (KITS) ×2 IMPLANT
PACK CARDIAC CATHETERIZATION (CUSTOM PROCEDURE TRAY) ×2 IMPLANT
TRANSDUCER W/STOPCOCK (MISCELLANEOUS) ×2 IMPLANT
TUBING CIL FLEX 10 FLL-RA (TUBING) ×2 IMPLANT

## 2021-11-03 NOTE — Interval H&P Note (Signed)
History and Physical Interval Note:  11/03/2021 1:23 PM  Teresa Grant  has presented today for surgery, with the diagnosis of stable angina.  The various methods of treatment have been discussed with the patient and family. After consideration of risks, benefits and other options for treatment, the patient has consented to  Procedure(s): LEFT HEART CATH AND CORONARY ANGIOGRAPHY (N/A) as a surgical intervention.  The patient's history has been reviewed, patient examined, no change in status, stable for surgery.  I have reviewed the patient's chart and labs.  Questions were answered to the patient's satisfaction.    Cath Lab Visit (complete for each Cath Lab visit)  Clinical Evaluation Leading to the Procedure:   ACS: No.  Non-ACS:    Anginal Classification: CCS IV  Anti-ischemic medical therapy: Minimal Therapy (1 class of medications)  Non-Invasive Test Results: No non-invasive testing performed  Prior CABG: No previous CABG  Mrk Buzby

## 2021-11-04 ENCOUNTER — Encounter (HOSPITAL_COMMUNITY): Payer: Self-pay | Admitting: Internal Medicine

## 2021-11-11 ENCOUNTER — Other Ambulatory Visit: Payer: Self-pay | Admitting: Internal Medicine

## 2021-12-05 ENCOUNTER — Encounter (HOSPITAL_BASED_OUTPATIENT_CLINIC_OR_DEPARTMENT_OTHER): Payer: Self-pay | Admitting: Family

## 2021-12-05 ENCOUNTER — Ambulatory Visit (INDEPENDENT_AMBULATORY_CARE_PROVIDER_SITE_OTHER): Payer: BC Managed Care – PPO | Admitting: Family

## 2021-12-05 ENCOUNTER — Other Ambulatory Visit: Payer: Self-pay

## 2021-12-05 VITALS — BP 118/74 | HR 84 | Ht 61.0 in | Wt 161.0 lb

## 2021-12-05 DIAGNOSIS — E785 Hyperlipidemia, unspecified: Secondary | ICD-10-CM

## 2021-12-05 DIAGNOSIS — I10 Essential (primary) hypertension: Secondary | ICD-10-CM

## 2021-12-05 DIAGNOSIS — E118 Type 2 diabetes mellitus with unspecified complications: Secondary | ICD-10-CM | POA: Diagnosis not present

## 2021-12-05 DIAGNOSIS — I25118 Atherosclerotic heart disease of native coronary artery with other forms of angina pectoris: Secondary | ICD-10-CM | POA: Diagnosis not present

## 2021-12-05 MED ORDER — ASPIRIN EC 81 MG PO TBEC: 81.0000 mg | DELAYED_RELEASE_TABLET | Freq: Every day | ORAL | 3 refills | Status: DC

## 2021-12-05 NOTE — Patient Instructions (Addendum)
Medication Instructions:  ?Your physician has recommended you make the following change in your medication:  ? ?START Aspirin EC '81mg'$  daily ? ?CHANGE your Pepcid to twice per day ? ?*If you need a refill on your cardiac medications before your next appointment, please call your pharmacy* ? ?Lab Work: ?None ordered today.  ? ?Testing/Procedures: ?Your cardiac catheterization showed minimal nonobstructive plaque in one of your heart vessels. We prevent this from worsening by taking Aspirin and Atorvastatin. We also prevent from progressing by following a heart healthy diet and staying active.  ? ?This is not enough to cause pain. Your pain is likely a combination of musculoskeletal pain and indigestion.  ? ? ?Follow-Up: ?At [facility], you and your health needs are our priority.  As part of our continuing mission to provide you with exceptional heart care, we have created designated Provider Care Teams.  These Care Teams include your primary Cardiologist (physician) and Advanced Practice Providers (APPs -  Physician Assistants and Nurse Practitioners) who all work together to provide you with the care you need, when you need it. ? ?We recommend signing up for the patient portal called "MyChart".  Sign up information is provided on this After Visit Summary.  MyChart is used to connect with patients for Virtual Visits (Telemedicine).  Patients are able to view lab/test results, encounter notes, upcoming appointments, etc.  Non-urgent messages can be sent to your provider as well.   ?To learn more about what you can do with MyChart, go to [email or web address].   ? ?Your next appointment:   ?In July with Dr. Oval Linsey ? ? ?Other Instructions ? ?For coronary artery disease often called "heart disease" we aim for optimal guideline directed medical therapy. We use the "A, B, C"s to help keep [place] on track! ? ?A = Aspirin '81mg'$  daily ?B = Blood pressure control. Our goal is for your blood pressure to be less than  130/80. ?C = Cholesterol control. You take Atorvastatin to help control your cholesterol.  ?D = Don't forget nitroglycerin! This is an emergency tablet to be used if you have chest pain. ? ?Heart Healthy Diet Recommendations: ?A low-salt diet is recommended. Meats should be grilled, baked, or boiled. Avoid fried foods. Focus on lean protein sources like fish or chicken with vegetables and fruits. The American Heart Association is a Microbiologist!  American Heart Association Diet and Lifeystyle Recommendations   ? ?Exercise recommendations: ?The American Heart Association recommends 150 minutes of moderate intensity exercise weekly. ?Try 30 minutes of moderate intensity exercise 4-5 times per week. ?This could include walking, jogging, or swimming.  ? ?Food Choices for Gastroesophageal Reflux Disease, Adult ?When you have gastroesophageal reflux disease (GERD), the foods you eat and your eating habits are very important. Choosing the right foods can help ease your discomfort. Think about working with a food expert (dietitian) to help you make good choices. ?What are tips for following this plan? ?Reading food labels ?Look for foods that are low in saturated fat. Foods that may help with your symptoms include: ?Foods that have less than 5% of daily value (DV) of fat. ?Foods that have 0 grams of trans fat. ?Cooking ?Do not fry your food. ?Cook your food by baking, steaming, grilling, or broiling. These are all methods that do not need a lot of fat for cooking. ?To add flavor, try to use herbs that are low in spice and acidity. ?Meal planning ? ?Choose healthy foods that are low in fat, such as: ?Fruits and  vegetables. ?Whole grains. ?Low-fat dairy products. ?Lean meats, fish, and poultry. ?Eat small meals often instead of eating 3 large meals each day. Eat your meals slowly in a place where you are relaxed. Avoid bending over or lying down until 2-3 hours after eating. ?Limit high-fat foods such as fatty meats or  fried foods. ?Limit your intake of fatty foods, such as oils, butter, and shortening. ?Avoid the following as told by your doctor: ?Foods that cause symptoms. These may be different for different people. Keep a food diary to keep track of foods that cause symptoms. ?Alcohol. ?Drinking a lot of liquid with meals. ?Eating meals during the 2-3 hours before bed. ?Lifestyle ?Stay at a healthy weight. Ask your doctor what weight is healthy for you. If you need to lose weight, work with your doctor to do so safely. ?Exercise for at least 30 minutes on 5 or more days each week, or as told by your doctor. ?Wear loose-fitting clothes. ?Do not smoke or use any products that contain nicotine or tobacco. If you need help quitting, ask your doctor. ?Sleep with the head of your bed higher than your feet. Use a wedge under the mattress or blocks under the bed frame to raise the head of the bed. ?Chew sugar-free gum after meals. ?What foods should eat? ?Eat a healthy, well-balanced diet of fruits, vegetables, whole grains, low-fat dairy products, lean meats, fish, and poultry. Each person is different. ?Foods that may cause symptoms in one person may not cause any symptoms in another person. Work with your doctor to find foods that are safe for you. ?The items listed above may not be a complete list of what you can eat and drink. Contact a food expert for more options. ?What foods should I avoid? ?Limiting some of these foods may help in managing the symptoms of GERD. Everyone is different. Talk with a food expert or your doctor to help you find the exact foods to avoid, if any. ?Fruits ?Any fruits prepared with added fat. Any fruits that cause symptoms. For some people, this may include citrus fruits, such as oranges, grapefruit, pineapple, and lemons. ?Vegetables ?Deep-fried vegetables. [place] fries. Any vegetables prepared with added fat. Any vegetables that cause symptoms. For some people, this may include tomatoes and tomato  products, chili peppers, onions and garlic, and horseradish. ?Grains ?Pastries or quick breads with added fat. ?Meats and other proteins ?High-fat meats, such as fatty beef or pork, hot dogs, ribs, ham, sausage, salami, and bacon. Fried meat or protein, including fried fish and fried chicken. Nuts and nut butters, in large amounts. ?Dairy ?Whole milk and chocolate milk. Sour cream. Cream. Ice cream. Cream cheese. Milkshakes. ?Fats and oils ?Butter. Margarine. Shortening. Ghee. ?Beverages ?Coffee and tea, with or without caffeine. Carbonated beverages. Sodas. Energy drinks. Fruit juice made with acidic fruits, such as orange or grapefruit. Tomato juice. Alcoholic drinks. ?Sweets and desserts ?Chocolate and cocoa. Donuts. ?Seasonings and condiments ?Pepper. Peppermint and spearmint. Added salt. Any condiments, herbs, or seasonings that cause symptoms. For some people, this may include curry, hot sauce, or vinegar-based salad dressings. ?The items listed above may not be a complete list of what you should not eat and drink. Contact a food expert for more options. ?Questions to ask your doctor ?Diet and lifestyle changes are often the first steps that are taken to manage symptoms of GERD. If diet and lifestyle changes do not help, talk with your doctor about taking medicines. ?Where to find more information ?[place]  for Gastrointestinal Disorders: aboutgerd.org ?Summary ?When you have GERD, food and lifestyle choices are very important in easing your symptoms. ?Eat small meals often instead of 3 large meals a day. Eat your meals slowly and in a place where you are relaxed. ?Avoid bending over or lying down until 2-3 hours after eating. ?Limit high-fat foods such as fatty meats or fried foods. ?This information is not intended to replace advice given to you by your health care provider. Make sure you discuss any questions you have with your health care provider. ?Document Revised: 03/22/2020 Document  Reviewed: 03/22/2020 ?Elsevier Patient Education ? [place]. ? ?

## 2021-12-05 NOTE — Progress Notes (Signed)
? ?Office Visit  ?  ?Patient Name: Teresa Grant ?Date of Encounter: 12/05/2021 ? ?PCP:  Glendale Chard, MD ?  ?[place]  ?Cardiologist:  Skeet Latch, MD  ?Advanced Practice Provider:  No care team member to display ?Electrophysiologist:  None  ?   ? ?Chief Complaint  ?  ?Teresa Grant is a 61 y.o. female with a hx of minimal nonobstructive coronary artery disease, hypertension, diabetes, hyperlipidemia presents today for follow-up after cardiac catheterization ? ?Past Medical History  ?  ?Past Medical History:  ?Diagnosis Date  ? Diabetes mellitus   ? Dysrhythmia   ? 1985  ? Heart murmur   ? HLD (hyperlipidemia)   ? Hypertension   ? Kidney stone on right side 03/2013  ? Pneumonia   ? 7 yrs ago  ? ?Past Surgical History:  ?Procedure Laterality Date  ? ABDOMINAL HYSTERECTOMY  2001  ? BACK SURGERY    ? CERVICAL SPINE SURGERY  06/06/2019  ? Dr. Christella Noa, performed at surgical center  ? [place]  ? COLONOSCOPY    ? Dr. Deatra Ina normal  ? LEFT HEART CATH AND CORONARY ANGIOGRAPHY N/A 11/03/2021  ? Procedure: LEFT HEART CATH AND CORONARY ANGIOGRAPHY;  Surgeon: Nelva Bush, MD;  Location: [facility];  Service: Cardiovascular;  Laterality: N/A;  ? LUMBAR LAMINECTOMY/DECOMPRESSION MICRODISCECTOMY  03/20/2012  ? Procedure: LUMBAR LAMINECTOMY/DECOMPRESSION MICRODISCECTOMY;  Surgeon: Sinclair Ship, MD;  Location: [facility];  Service: Orthopedics;  Laterality: Left;  Left sided lumbar 4-5 microdisectomy  ? [place] SURGERY  2013  ? ? ?Allergies ? ?Allergies  ?Allergen Reactions  ? Lisinopril Cough  ? Atorvastatin Other (See Comments)  ? Glipizide Other (See Comments)  ? Hydrochlorothiazide Other (See Comments)  ?  pancreatitis  ? ? ?History of Present Illness  ?  ?Teresa Grant is a 61 y.o. female with a hx of minimal nonobstructive coronary artery disease, hypertension, diabetes, hyperlipidemia last seen 11/03/2021 for LHC. ? ?Prior echocardiogram 2015 normal LVEF  60 to 65%, no significant valvular abnormality.  She saw her primary care provider December 2022 noting chest discomfort which she thought was GI related.  However given cardiovascular risk she was referred to cardiology.  She was evaluated by Dr. Oval Linsey 11/02/2021 and recommended for cardiac catheterization which was performed 11/03/2021 revealing minimal plaque in the proximal (10%) and mid (5%) RCA without angiographically significant stenosis.  Recommended for medical management. ? ?She presents today for follow up.  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 We reviewed her LHC in detail. Tells me she gets chest pain like a band that feels "tight" below her breasts. It is worse with certain moving or twisting motions. Most notable at night when she lays on her right side. Does lots of lifting at work and notes it does often hurt worse at work. Also endorses some discomfort after she eats. Taking Pepcid only once per day. Reports no shortness of breath nor dyspnea on exertion.  No orthopnea, PND.  Endorses mild lower extremity edema that resolves with elevation.  Reports no palpitations.   ? ?EKGs/Labs/Other Studies Reviewed:  ? ?The following studies were reviewed today: ? ?LHC 11/03/21 ?Conclusions: ?Minimal plaquing of the proximal and mid RCA without angiographically significant stenosis in any coronary artery to explain the patient's chest pain. ?Normal left ventricular filling pressure. ?  ?Recommendations: ?Primary prevention of coronary artery disease. ?Consider echocardiogram to assess LVEF;  left ventriculogram not performed today in the setting of chronic kidney disease. ?  ? ?Echo 07/11/2014: ?Study Conclusions  ? ?- Left ventricle: The cavity size was normal. There was mild focal  ?  basal hypertrophy of the septum. Systolic function was normal.  ?  The estimated ejection fraction was in the range of 60% to 65%.  ?  Images were inadequate for LV wall  motion assessment.  ?  ?EKG: No EKG today ? ?Recent Labs: ?11/01/2021: ALT 12; BUN 17; Creatinine, Ser 1.25; Hemoglobin 12.5; Platelets 264; Potassium 4.2; Sodium 143  ?Recent Lipid Panel ?   ?Component Value Date/Time  ? CHOL 122 11/01/2021 0824  ? TRIG 116 11/01/2021 0824  ? HDL 36 (L) 11/01/2021 0824  ? CHOLHDL 3.4 11/01/2021 0824  ? CHOLHDL 2.8 09/13/2012 1156  ? VLDL 14 09/13/2012 1156  ? [place] 65 11/01/2021 0824  ? ? ?Home Medications  ? ?Current Meds  ?Medication Sig  ? amLODipine (NORVASC) 10 MG tablet Take 1 tablet (10 mg total) by mouth daily. (Patient taking differently: Take 10 mg by mouth every evening.)  ? Ascorbic Acid (VITAMIN C PO) Take 500 mg by mouth every evening.  ? aspirin EC 81 MG tablet Take 1 tablet (81 mg total) by mouth daily. Swallow whole.  ? Aspirin-Salicylamide-Caffeine (BC FAST PAIN RELIEF) 650-195-33.3 MG PACK Take 1 packet by mouth daily as needed (pain.).  ? atorvastatin (LIPITOR) 20 MG tablet Take 1 tablet (20 mg total) by mouth daily. (Patient taking differently: Take 20 mg by mouth every evening.)  ? Dapagliflozin-metFORMIN HCl ER (XIGDUO XR) 01-999 MG TB24 Take 1 tablet by mouth in the morning and at bedtime.  ? famotidine (PEPCID) 20 MG tablet TAKE ONE TABLET BY MOUTH TWICE A DAY  ? gabapentin (NEURONTIN) 100 MG capsule TAKE ONE CAPSULE BY MOUTH THREE TIMES A DAY (Patient taking differently: 100 mg 2 (two) times daily.)  ? glucose blood (ONETOUCH VERIO) test strip Use as directed to check blood sugars 2 times per day dx: e11.65  ? insulin degludec (TRESIBA FLEXTOUCH) 200 UNIT/ML FlexTouch Pen Inject 34 Units into the skin daily in the afternoon. (Patient taking differently: Inject 26 Units into the skin at bedtime.)  ? nitroGLYCERIN (NITROSTAT) 0.4 MG SL tablet Place 1 tablet (0.4 mg total) under the tongue every 5 (five) minutes as needed for chest pain. UP TO 3 TABLETS  ? Semaglutide, 1 MG/DOSE, (OZEMPIC, 1 MG/DOSE,) 4 MG/3ML SOPN Inject 1 mg into the skin once a week.  ?  telmisartan (MICARDIS) 20 MG tablet Take 1 tablet (20 mg total) by mouth every evening.  ? Zinc Sulfate (ZINCATE PO) Take 50 mg by mouth every evening.  ?  ?Review of Systems  ?    ?All other systems reviewed and are otherwise negative except as noted above. ? ?Physical Exam  ?  ?VS:  BP 118/74   Pulse 84   Ht '5\' 1"'$  (1.549 m)   Wt 161 lb (73 kg)   BMI 30.42 kg/m?  , BMI Body mass index is 30.42 kg/m?. ? ?Wt Readings from Last 3 Encounters:  ?12/05/21 161 lb (73 kg)  ?11/03/21 163 lb (73.9 kg)  ?10/31/21 163 lb 9.6 oz (74.2 kg)  ?  ? ?GEN: Well nourished, well developed, in no acute distress. ?HEENT: normal. ?Neck: Supple, no JVD, carotid bruits, or masses. ?Cardiac: RRR, no murmurs, rubs, or gallops. No clubbing, cyanosis, edema.  Radials/PT 2+ and equal bilaterally.  ?Respiratory:  Respirations regular and unlabored, clear  to auscultation bilaterally. ?GI: Soft, nontender, nondistended. ?MS: No deformity or atrophy. ?Skin: Warm and dry, no rash.  Cardiac cath site healed appropriately with no ecchymosis or hematoma. ?Neuro:  Strength and sensation are intact. ?Psych: Normal affect. ? ?Assessment & Plan  ?  ?HTN - BP well controlled. Continue current antihypertensive regimen including telmisartan 20 mg daily, amlodipine 10 mg daily ? ?CAD -LHC 10/2021 with minimal plaquing of proximal and mid RCA recommended for medical management. Chest pain non cardiac. Consider etiology musculoskeletal or GERD. Encouraged to take her Pepcid BID and follow up with PCP. GDMT includes  Atorvastatin. Start Aspirin EC '81mg'$  QD. Heart healthy diet and regular cardiovascular exercise encouraged.   ? ?HLD, LDL goal less than 70 -11/01/2021 total cholesterol 122, triglycerides 116, HDL 36, LDL 65. Continue Atorvastatin '20mg'$  QD.  ? ?DM2 - Continue to follow with endocrinology. Notes some hypoglycemic episodes with CBG as low as 35. Only taking Dapagliflozin-Metformin once per day. Encouraged to reach out to endocrinology.   ? ? ?Disposition: Follow up in 4 month(s) with Skeet Latch, MD or APP. ? ?Signed, ?Loel Dubonnet, NP ?12/05/2021, 4:10 PM ?[place] ?

## 2021-12-06 NOTE — Progress Notes (Signed)
Patient has been notified and verbalized understanding 

## 2021-12-07 ENCOUNTER — Other Ambulatory Visit: Payer: Self-pay | Admitting: Internal Medicine

## 2021-12-12 ENCOUNTER — Ambulatory Visit (INDEPENDENT_AMBULATORY_CARE_PROVIDER_SITE_OTHER): Payer: BC Managed Care – PPO | Admitting: Internal Medicine

## 2021-12-12 ENCOUNTER — Encounter: Payer: Self-pay | Admitting: Internal Medicine

## 2021-12-12 ENCOUNTER — Other Ambulatory Visit: Payer: Self-pay

## 2021-12-12 VITALS — BP 112/70 | HR 83 | Temp 98.3°F | Ht 61.0 in | Wt 161.6 lb

## 2021-12-12 DIAGNOSIS — N289 Disorder of kidney and ureter, unspecified: Secondary | ICD-10-CM

## 2021-12-12 DIAGNOSIS — I10 Essential (primary) hypertension: Secondary | ICD-10-CM

## 2021-12-12 DIAGNOSIS — E6609 Other obesity due to excess calories: Secondary | ICD-10-CM | POA: Diagnosis not present

## 2021-12-12 DIAGNOSIS — E66811 Obesity, class 1: Secondary | ICD-10-CM

## 2021-12-12 DIAGNOSIS — Z0000 Encounter for general adult medical examination without abnormal findings: Secondary | ICD-10-CM

## 2021-12-12 DIAGNOSIS — Z794 Long term (current) use of insulin: Secondary | ICD-10-CM | POA: Diagnosis not present

## 2021-12-12 DIAGNOSIS — E1142 Type 2 diabetes mellitus with diabetic polyneuropathy: Secondary | ICD-10-CM | POA: Diagnosis not present

## 2021-12-12 DIAGNOSIS — Z23 Encounter for immunization: Secondary | ICD-10-CM | POA: Diagnosis not present

## 2021-12-12 DIAGNOSIS — Z6830 Body mass index (BMI) 30.0-30.9, adult: Secondary | ICD-10-CM

## 2021-12-12 LAB — POCT URINALYSIS DIPSTICK
Bilirubin, UA: NEGATIVE
Blood, UA: NEGATIVE
Glucose, UA: POSITIVE — AB
Ketones, UA: NEGATIVE
Leukocytes, UA: NEGATIVE
Nitrite, UA: NEGATIVE
Protein, UA: POSITIVE — AB
Spec Grav, UA: 1.02 (ref 1.010–1.025)
Urobilinogen, UA: 0.2 E.U./dL
pH, UA: 5.5 (ref 5.0–8.0)

## 2021-12-12 NOTE — Progress Notes (Signed)
?Rich Brave Llittleton,acting as a Education administrator for [name], MD.,have documented all relevant documentation on the behalf of [name], MD,as directed by  [name], MD while in the presence of [name], MD.  ?This visit occurred during the SARS-CoV-2 public health emergency.  Safety protocols were in place, including screening questions prior to the visit, additional usage of staff PPE, and extensive cleaning of exam room while observing appropriate contact time as indicated for disinfecting solutions. ? ?Subjective:  ?  ? Patient ID: Teresa Grant , female    DOB: Jun 12, 1961 , 61 y.o.   MRN: [id number] ? ? ?Chief Complaint  ?Patient presents with  ? Annual Exam  ? ? ?HPI ? ?She is here today for a full physical exam. She is followed by Dr. Everett Graff GYN for her pelvic exams. She reports compliance with meds. She denies having any headaches, chest pain and shortness of breath.  ? ?Diabetes ?She presents for her follow-up diabetic visit. She has type 2 diabetes mellitus. Her disease course has been stable. There are no hypoglycemic associated symptoms. Pertinent negatives for hypoglycemia include no dizziness or headaches. Pertinent negatives for diabetes include no blurred vision, no chest pain, no fatigue, no polydipsia, no polyphagia and no polyuria. There are no hypoglycemic complications. Risk factors for coronary artery disease include diabetes mellitus, dyslipidemia, hypertension, obesity, post-menopausal, sedentary lifestyle and stress. Her breakfast blood glucose is taken between 8-9 am. Her breakfast blood glucose range is generally 130-140 mg/dl. An ACE inhibitor/angiotensin II receptor blocker is being taken.  ?Hypertension ?This is a chronic problem. The current episode started more than 1 year ago. The problem has been gradually improving since onset. The problem is controlled. Pertinent negatives include no blurred vision, chest pain or headaches.   ? ?Past Medical  History:  ?Diagnosis Date  ? Diabetes mellitus   ? Dysrhythmia   ? 1985  ? Heart murmur   ? HLD (hyperlipidemia)   ? Hypertension   ? Kidney stone on right side 03/2013  ? Pneumonia   ? 7 yrs ago  ?  ? ?Family History  ?Problem Relation Age of Onset  ? Hypertension Mother   ? Stroke Mother   ? Heart attack Mother 16  ? Heart disease Mother   ? Hypertension Father   ? Kidney disease Father   ? Hypertension Sister   ? Hypertension Brother   ? Hypertension Maternal Aunt   ? Hypertension Maternal Uncle   ? Esophageal cancer Maternal Uncle   ? Diabetes Paternal Aunt   ? Diabetes Paternal Uncle   ? Colon cancer Cousin   ? Cancer Neg Hx   ? Colon polyps Neg Hx   ? Rectal cancer Neg Hx   ? Stomach cancer Neg Hx   ? ? ? ?Current Outpatient Medications:  ?  amLODipine (NORVASC) 10 MG tablet, TAKE ONE TABLET BY MOUTH DAILY, Disp: 90 tablet, Rfl: 1 ?  Ascorbic Acid (VITAMIN C PO), Take 500 mg by mouth every evening., Disp: , Rfl:  ?  aspirin EC 81 MG tablet, Take 1 tablet (81 mg total) by mouth daily. Swallow whole., Disp: 90 tablet, Rfl: 3 ?  Aspirin-Salicylamide-Caffeine (BC FAST PAIN RELIEF) 650-195-33.3 MG PACK, Take 1 packet by mouth daily as needed (pain.)., Disp: , Rfl:  ?  atorvastatin (LIPITOR) 20 MG tablet, Take 1 tablet (20 mg total) by mouth daily. (Patient taking differently: Take 20 mg by mouth every evening.), Disp: 90 tablet, Rfl: 3 ?  Dapagliflozin-metFORMIN HCl ER (XIGDUO XR) 01-999 MG TB24, Take 1 tablet by mouth in the morning and at bedtime., Disp: , Rfl:  ?  famotidine (PEPCID) 20 MG tablet, TAKE ONE TABLET BY MOUTH TWICE A DAY, Disp: 60 tablet, Rfl: 1 ?  gabapentin (NEURONTIN) 100 MG capsule, TAKE ONE CAPSULE BY MOUTH THREE TIMES A DAY (Patient taking differently: 100 mg 2 (two) times daily.), Disp: 270 capsule, Rfl: 1 ?  glucose blood (ONETOUCH VERIO) test strip, Use as directed to check blood sugars 2 times per day dx: e11.65, Disp: 100 each, Rfl: 10 ?  insulin degludec (TRESIBA FLEXTOUCH) 200 UNIT/ML  FlexTouch Pen, Inject 34 Units into the skin daily in the afternoon. (Patient taking differently: Inject 26 Units into the skin at bedtime.), Disp: 15 mL, Rfl: 6 ?  nitroGLYCERIN (NITROSTAT) 0.4 MG SL tablet, Place 1 tablet (0.4 mg total) under the tongue every 5 (five) minutes as needed for chest pain. UP TO 3 TABLETS, Disp: 25 tablet, Rfl: PRN ?  Semaglutide, 1 MG/DOSE, (OZEMPIC, 1 MG/DOSE,) 4 MG/3ML SOPN, Inject 1 mg into the skin once a week., Disp: 9 mL, Rfl: 2 ?  telmisartan (MICARDIS) 20 MG tablet, Take 1 tablet (20 mg total) by mouth every evening., Disp: 60 tablet, Rfl: 1 ?  Vitamin D, Ergocalciferol, (DRISDOL) 1.25 MG (50000 UNIT) CAPS capsule, TAKE ONE CAPSULE BY MOUTH ONCE WEEKLY, Disp: 12 capsule, Rfl: 1 ?  Zinc Sulfate (ZINCATE PO), Take 50 mg by mouth every evening., Disp: , Rfl:   ? ?Allergies  ?Allergen Reactions  ? Lisinopril Cough  ? Atorvastatin Other (See Comments)  ? Glipizide Other (See Comments)  ? Hydrochlorothiazide Other (See Comments)  ?  pancreatitis  ?  ? ? ?The patient states she uses post menopausal status for birth control. Last LMP was No LMP recorded. Patient has had a hysterectomy.. Negative for Dysmenorrhea. Negative for: breast discharge, breast lump(s), breast pain and breast self exam. Associated symptoms include abnormal vaginal bleeding. Pertinent negatives include abnormal bleeding (hematology), anxiety, decreased libido, depression, difficulty falling sleep, dyspareunia, history of infertility, nocturia, sexual dysfunction, sleep disturbances, urinary incontinence, urinary urgency, vaginal discharge and vaginal itching. Diet regular.The patient states her exercise level is  intermittent. ? . The patient's tobacco use is:  ?Social History  ? ?Tobacco Use  ?Smoking Status Former  ? Packs/day: 0.50  ? Years: 25.00  ? Pack years: 12.50  ? Types: Cigarettes  ? Quit date: 04/29/2004  ? Years since quitting: 17.6  ?Smokeless Tobacco Never  ?Marland Kitchen She has been exposed to passive smoke.  The patient's alcohol use is:  ?Social History  ? ?Substance and Sexual Activity  ?Alcohol Use Yes  ? Comment: rare  ? ? ?Review of Systems  ?Constitutional: Negative.  Negative for fatigue.  ?HENT: Negative.    ?Eyes: Negative.  Negative for blurred vision.  ?Respiratory: Negative.    ?Cardiovascular: Negative.  Negative for chest pain.  ?Gastrointestinal: Negative.   ?Endocrine: Negative.  Negative for polydipsia, polyphagia and polyuria.  ?Genitourinary: Negative.   ?Musculoskeletal: Negative.   ?Skin: Negative.   ?Allergic/Immunologic: Negative.   ?Neurological: Negative.  Negative for dizziness and headaches.  ?Hematological: Negative.   ?Psychiatric/Behavioral: Negative.     ? ?Today's Vitals  ? 12/12/21 1455  ?BP: 112/70  ?Pulse: 83  ?Temp: 98.3 ?F (36.8 ?C)  ?Weight: 161 lb 9.6 oz (73.3 kg)  ?Height: '5\' 1"'$  (1.549 m)  ?PainSc: 0-No pain  ? ?Body mass index is 30.53 kg/m?.  ?Wt Readings from  Last 3 Encounters:  ?12/12/21 161 lb 9.6 oz (73.3 kg)  ?12/05/21 161 lb (73 kg)  ?11/03/21 163 lb (73.9 kg)  ?  ? ?Objective:  ?Physical Exam ?Vitals and nursing note reviewed.  ?Constitutional:   ?   Appearance: Normal appearance.  ?HENT:  ?   Head: Normocephalic and atraumatic.  ?   Right Ear: Tympanic membrane, ear canal and external ear normal.  ?   Left Ear: Tympanic membrane, ear canal and external ear normal.  ?   Nose:  ?   Comments: Masked  ?   Mouth/Throat:  ?   Comments: Masked  ?Eyes:  ?   Extraocular Movements: Extraocular movements intact.  ?   Conjunctiva/sclera: Conjunctivae normal.  ?   Pupils: Pupils are equal, round, and reactive to light.  ?Cardiovascular:  ?   Rate and Rhythm: Normal rate and regular rhythm.  ?   Pulses:     ?     Dorsalis pedis pulses are 3+ on the right side and 3+ on the left side.  ?     Posterior tibial pulses are 3+ on the right side and 3+ on the left side.  ?   Heart sounds: Normal heart sounds.  ?Pulmonary:  ?   Effort: Pulmonary effort is normal.  ?   Breath sounds: Normal  breath sounds.  ?Chest:  ?Breasts: ?   Tanner Score is 5.  ?   Right: Normal.  ?   Left: Normal.  ?Abdominal:  ?   General: Bowel sounds are normal.  ?   Palpations: Abdomen is soft.  ?   Comments: Rounded, soft

## 2021-12-12 NOTE — Patient Instructions (Addendum)
Chronic Kidney Disease, Adult ?Chronic kidney disease is when lasting damage happens to the kidneys slowly over a long time. The kidneys help to: ?Make pee (urine). ?Make hormones. ?Keep the right amount of fluids and chemicals in the body. ?Most often, this disease does not go away. You must take steps to help keep the kidney damage from getting worse. If steps are not taken, the kidneys might stop working forever. ?What are the causes? ?Diabetes. ?High blood pressure. ?Diseases that affect the heart and blood vessels. ?Other kidney diseases. ?Diseases of the body's disease-fighting system. ?A problem with the flow of pee. ?Infections of the organs that make pee, store it, and take it out of the body. ?Swelling or irritation of your blood vessels. ?What increases the risk? ?Getting older. ?Having someone in your family who has kidney disease or kidney failure. ?Having a disease caused by genes. ?Taking medicines often that harm the kidneys. ?Being near or having contact with harmful substances. ?Being very overweight. ?Using tobacco now or in the past. ?What are the signs or symptoms? ?Feeling very tired. ?Having a swollen face, legs, ankles, or feet. ?Feeling like you may vomit or vomiting. ?Not feeling hungry. ?Being confused or not able to focus. ?Twitches and cramps in the leg muscles or other muscles. ?Dry, itchy skin. ?A taste of metal in your mouth. ?Making less pee, or making more pee. ?Shortness of breath. ?Trouble sleeping. ?You may also become anemic or get weak bones. Anemic means there is not enough red blood cells or hemoglobin in your blood. ?You may get symptoms slowly. You may not notice them until the kidney damage gets very bad. ?How is this treated? ?Often, there is no cure for this disease. Treatment can help with symptoms and help keep the disease from getting worse. You may need to: ?Avoid alcohol. ?Avoid foods that are high in salt, potassium, phosphorous, and protein. ?Take medicines for  symptoms and to help control other conditions. ?Have dialysis. This treatment gets harmful waste out of your body. ?Treat other problems that cause your kidney disease or make it worse. ?Follow these instructions at home: ?Medicines ?Take over-the-counter and prescription medicines only as told by your doctor. ?Do not take any new medicines, vitamins, or supplements unless your doctor says it is okay. ?Lifestyle ? ?Do not smoke or use any products that contain nicotine or tobacco. If you need help quitting, ask your doctor. ?If you drink alcohol: ?Limit how much you use to: ?0-1 drink a day for women who are not pregnant. ?0-2 drinks a day for men. ?Know how much alcohol is in your drink. In the [place] one drink equals one 12 oz bottle of beer (355 mL), one 5 oz glass of wine (148 mL), or one 1? oz glass of hard liquor (44 mL). ?Stay at a healthy weight. If you need help losing weight, ask your doctor. ?General instructions ? ?Follow instructions from your doctor about what you cannot eat or drink. ?Track your blood pressure at home. Tell your doctor about any changes. ?If you have diabetes, track your blood sugar. ?Exercise at least 30 minutes a day, 5 days a week. ?Keep your shots (vaccinations) up to date. ?Keep all follow-up visits. ?Where to find more information ?American Association of Kidney Patients: BombTimer.gl ?[place]: www.kidney.org ?[place]: https://mathis.com/ ?[employer]: www.lifeoptions.org ?[employer]: www.kidneyschool.org ?Contact a doctor if: ?Your symptoms get worse. ?You get new symptoms. ?Get help right away if: ?You get symptoms of end-stage kidney disease. These  include: ?Headaches. ?Losing feeling in your hands or feet. ?Easy bruising. ?Having hiccups often. ?Chest pain. ?Shortness of breath. ?Lack of menstrual periods, in women. ?You have a fever. ?You make less pee than normal. ?You have pain or you bleed when you pee or poop. ?These symptoms may be an  emergency. Get help right away. Call your local emergency services (911 in the [place] ?Do not wait to see if the symptoms will go away. ?Do not drive yourself to the hospital. ?Summary ?Chronic kidney disease is when lasting damage happens to the kidneys slowly over a long time. ?Causes of this disease include diabetes and high blood pressure. ?Often, there is no cure for this disease. Treatment can help symptoms and help keep the disease from getting worse. ?Treatment may involve lifestyle changes, medicines, and dialysis. ?This information is not intended to replace advice given to you by your health care provider. Make sure you discuss any questions you have with your health care provider. ?Document Revised: 12/17/2019 Document Reviewed: 12/17/2019 ?Elsevier Patient Education ? [place]. ? ? ?Health Maintenance, Female ?Adopting a healthy lifestyle and getting preventive care are important in promoting health and wellness. Ask your health care provider about: ?The right schedule for you to have regular tests and exams. ?Things you can do on your own to prevent diseases and keep yourself healthy. ?What should I know about diet, weight, and exercise? ?Eat a healthy diet ? ?Eat a diet that includes plenty of vegetables, fruits, low-fat dairy products, and lean protein. ?Do not eat a lot of foods that are high in solid fats, added sugars, or sodium. ?Maintain a healthy weight ?Body mass index (BMI) is used to identify weight problems. It estimates body fat based on height and weight. Your health care provider can help determine your BMI and help you achieve or maintain a healthy weight. ?Get regular exercise ?Get regular exercise. This is one of the most important things you can do for your health. Most adults should: ?Exercise for at least 150 minutes each week. The exercise should increase your heart rate and make you sweat (moderate-intensity exercise). ?Do strengthening exercises at least twice a week.  This is in addition to the moderate-intensity exercise. ?Spend less time sitting. Even light physical activity can be beneficial. ?Watch cholesterol and blood lipids ?Have your blood tested for lipids and cholesterol at 61 years of age, then have this test every 5 years. ?Have your cholesterol levels checked more often if: ?Your lipid or cholesterol levels are high. ?You are older than 61 years of age. ?You are at high risk for heart disease. ?What should I know about cancer screening? ?Depending on your health history and family history, you may need to have cancer screening at various ages. This may include screening for: ?Breast cancer. ?Cervical cancer. ?Colorectal cancer. ?Skin cancer. ?Lung cancer. ?What should I know about heart disease, diabetes, and high blood pressure? ?Blood pressure and heart disease ?High blood pressure causes heart disease and increases the risk of stroke. This is more likely to develop in people who have high blood pressure readings or are overweight. ?Have your blood pressure checked: ?Every 3-5 years if you are 17-42 years of age. ?Every year if you are 37 years old or older. ?Diabetes ?Have regular diabetes screenings. This checks your fasting blood sugar level. Have the screening done: ?Once every three years after age 55 if you are at a normal weight and have a low risk for diabetes. ?More often and at a  younger age if you are overweight or have a high risk for diabetes. ?What should I know about preventing infection? ?Hepatitis B ?If you have a higher risk for hepatitis B, you should be screened for this virus. Talk with your health care provider to find out if you are at risk for hepatitis B infection. ?Hepatitis C ?Testing is recommended for: ?Everyone born from [birth year] through 1965. ?Anyone with known risk factors for hepatitis C. ?Sexually transmitted infections (STIs) ?Get screened for STIs, including gonorrhea and chlamydia, if: ?You are sexually active and are younger than  61 years of age. ?You are older than 61 years of age and your health care provider tells you that you are at risk for this type of infection. ?Your sexual activity has changed since you were last screened, an

## 2021-12-14 LAB — MICROALBUMIN / CREATININE URINE RATIO
Creatinine, Urine: 113.1 mg/dL
Microalb/Creat Ratio: 201 mg/g creat — ABNORMAL HIGH (ref 0–29)
Microalbumin, Urine: 226.9 ug/mL

## 2021-12-14 LAB — BMP8+EGFR
BUN/Creatinine Ratio: 12 (ref 12–28)
BUN: 16 mg/dL (ref 8–27)
CO2: 24 mmol/L (ref 20–29)
Calcium: 9.9 mg/dL (ref 8.7–10.3)
Chloride: 103 mmol/L (ref 96–106)
Creatinine, Ser: 1.31 mg/dL — ABNORMAL HIGH (ref 0.57–1.00)
Glucose: 64 mg/dL — ABNORMAL LOW (ref 70–99)
Potassium: 4.1 mmol/L (ref 3.5–5.2)
Sodium: 142 mmol/L (ref 134–144)
eGFR: 47 mL/min/{1.73_m2} — ABNORMAL LOW (ref >59)

## 2021-12-14 LAB — PTH, INTACT AND CALCIUM: PTH: 49 pg/mL (ref 15–65)

## 2021-12-14 LAB — PROTEIN ELECTROPHORESIS, SERUM
A/G Ratio: 1.2 (ref 0.7–1.7)
Albumin ELP: 3.8 g/dL (ref 2.9–4.4)
Alpha 1: 0.2 g/dL (ref 0.0–0.4)
Alpha 2: 0.8 g/dL (ref 0.4–1.0)
Beta: 1 g/dL (ref 0.7–1.3)
Gamma Globulin: 1.3 g/dL (ref 0.4–1.8)
Globulin, Total: 3.3 g/dL (ref 2.2–3.9)
Total Protein: 7.1 g/dL (ref 6.0–8.5)

## 2021-12-14 LAB — HEMOGLOBIN A1C
Est. average glucose Bld gHb Est-mCnc: 131 mg/dL
Hgb A1c MFr Bld: 6.2 % — ABNORMAL HIGH (ref 4.8–5.6)

## 2021-12-14 LAB — PHOSPHORUS: Phosphorus: 4.3 mg/dL (ref 3.0–4.3)

## 2022-01-20 ENCOUNTER — Encounter: Payer: Self-pay | Admitting: Internal Medicine

## 2022-01-20 ENCOUNTER — Ambulatory Visit: Payer: BC Managed Care – PPO | Admitting: Internal Medicine

## 2022-01-20 VITALS — BP 122/78 | HR 92 | Ht 61.0 in | Wt 158.0 lb

## 2022-01-20 DIAGNOSIS — E1142 Type 2 diabetes mellitus with diabetic polyneuropathy: Secondary | ICD-10-CM

## 2022-01-20 DIAGNOSIS — Z794 Long term (current) use of insulin: Secondary | ICD-10-CM

## 2022-01-20 DIAGNOSIS — E785 Hyperlipidemia, unspecified: Secondary | ICD-10-CM

## 2022-01-20 NOTE — Progress Notes (Signed)
?Name: Teresa Grant  ?MRN/ DOB: [id number], 04-22-1961   ?Age/ Sex: 61 y.o., female   ? ?PCP: Glendale Chard, MD   ?Reason for Endocrinology Evaluation: Type 2 Diabetes Mellitus  ?   ?Date of Initial Endocrinology Visit: 06/15/2021  ? ? ?PATIENT IDENTIFIER: Teresa Grant is a 61 y.o. female with a past medical history of T2DM, HTN and Dyslipidemia . The patient presented for initial endocrinology clinic visit on 06/15/2021 for consultative assistance with her diabetes management.  ? ? ?HPI: ?Teresa Grant was  ? ? ?Diagnosed with DM 2008 ?Prior Medications tried/Intolerance: Glipizide , januvia  ?Hemoglobin A1c has ranged from 6.8% in 2020, peaking at 11.2% in 2022. ? ? ?On her initial visit to our clinic she had an A1c of 8.5%, we continued Xigduo, increase Ozempic, and decrease basal insulin ? ?SUBJECTIVE:  ? ?During the last visit (09/15/2021): A1c was 7.8%, restarted Ozempic, continued [place], and [place] ? ?Today (01/20/22): Teresa Grant is here for follow-up on diabetes management.  She checks her blood sugars occasionally . The patient has not had hypoglycemic episodes since the last clinic visit. ? ? ?Teresa Grant makes her sick to the stomach , so she reduced the dose to ne tablet which helped  ?Denies vomiting and has loose stools  ? ?Husband with prostate cancer  ? ? ? ?HOME DIABETES REGIMEN: ?Xigduo 01-999 mg twice daily- takes it once daily  ?Ozempic 1 mg weekly  ?Tresiba 26 units daily  ? ? ?Statin: yes ?ACE-I/ARB: Intolerant to Lisinopril due to cough  ? ? ? ?METER DOWNLOAD SUMMARY: Date range evaluated: 4/15-4/28/2023 ?Fingerstick Blood Glucose Tests = 4 ?Overall Mean FS Glucose =116 ?Standard Deviation = 184 ? ?BG Ranges: ?Low = 81 ?High = 470 ? ? ?Hypoglycemic Events/30 Days: ?BG < 50 = 0 ?Episodes of symptomatic severe hypoglycemia = 0 ? ? ?DIABETIC COMPLICATIONS: ?Microvascular complications:  ? ?Denies: CKD, retinopathy, neuropathy  ?Last eye exam: Completed 2021 ? ?Macrovascular  complications:  ? ?Denies: CAD, PVD, CVA ? ? ?PAST HISTORY: ?Past Medical History:  ?Past Medical History:  ?Diagnosis Date  ? Diabetes mellitus   ? Dysrhythmia   ? 1985  ? Heart murmur   ? HLD (hyperlipidemia)   ? Hypertension   ? Kidney stone on right side 03/2013  ? Pneumonia   ? 7 yrs ago  ? ?Past Surgical History:  ?Past Surgical History:  ?Procedure Laterality Date  ? ABDOMINAL HYSTERECTOMY  2001  ? BACK SURGERY    ? CERVICAL SPINE SURGERY  06/06/2019  ? Dr. Christella Noa, performed at surgical center  ? [facility]  ? COLONOSCOPY    ? Dr. Deatra Ina normal  ? LEFT HEART CATH AND CORONARY ANGIOGRAPHY N/A 11/03/2021  ? Procedure: LEFT HEART CATH AND CORONARY ANGIOGRAPHY;  Surgeon: Nelva Bush, MD;  Location: [facility];  Service: Cardiovascular;  Laterality: N/A;  ? LUMBAR LAMINECTOMY/DECOMPRESSION MICRODISCECTOMY  03/20/2012  ? Procedure: LUMBAR LAMINECTOMY/DECOMPRESSION MICRODISCECTOMY;  Surgeon: Sinclair Ship, MD;  Location: [facility];  Service: Orthopedics;  Laterality: Left;  Left sided lumbar 4-5 microdisectomy  ? [place] SURGERY  2013  ?  ?Social History:  reports that she quit smoking about 17 years ago. Her smoking use included cigarettes. She has a 12.50 pack-year smoking history. She has never used smokeless tobacco. She reports current alcohol use. She reports that she does not use drugs. ?Family History:  ?Family History  ?Problem Relation Age of Onset  ? Hypertension Mother   ? Stroke Mother   ?  Heart attack Mother 86  ? Heart disease Mother   ? Hypertension Father   ? Kidney disease Father   ? Hypertension Sister   ? Hypertension Brother   ? Hypertension Maternal Aunt   ? Hypertension Maternal Uncle   ? Esophageal cancer Maternal Uncle   ? Diabetes Paternal Aunt   ? Diabetes Paternal Uncle   ? Colon cancer Cousin   ? Cancer Neg Hx   ? Colon polyps Neg Hx   ? Rectal cancer Neg Hx   ? Stomach cancer Neg Hx   ? ? ? ?HOME MEDICATIONS: ?Allergies as of 01/20/2022   ? ?   Reactions  ?  Lisinopril Cough  ? Atorvastatin Other (See Comments)  ? Glipizide Other (See Comments)  ? Hydrochlorothiazide Other (See Comments)  ? pancreatitis  ? ?  ? ?  ?Medication List  ?  ? ?  ? Accurate as of January 20, 2022  2:30 PM. If you have any questions, ask your nurse or doctor.  ?  ?  ? ?  ? ?amLODipine 10 MG tablet ?Commonly known as: NORVASC ?TAKE ONE TABLET BY MOUTH DAILY ?  ?aspirin EC 81 MG tablet ?Take 1 tablet (81 mg total) by mouth daily. Swallow whole. ?  ?atorvastatin 20 MG tablet ?Commonly known as: LIPITOR ?Take 1 tablet (20 mg total) by mouth daily. ?What changed: when to take this ?  ?BC Fast Pain Relief 650-195-33.3 MG Pack ?Generic drug: Aspirin-Salicylamide-Caffeine ?Take 1 packet by mouth daily as needed (pain.). ?  ?famotidine 20 MG tablet ?Commonly known as: PEPCID ?TAKE ONE TABLET BY MOUTH TWICE A DAY ?  ?gabapentin 100 MG capsule ?Commonly known as: NEURONTIN ?TAKE ONE CAPSULE BY MOUTH THREE TIMES A DAY ?What changed:  ?how to take this ?when to take this ?  ?glucose blood test strip ?Commonly known as: OneTouch Verio ?Use as directed to check blood sugars 2 times per day dx: e11.65 ?  ?nitroGLYCERIN 0.4 MG SL tablet ?Commonly known as: NITROSTAT ?Place 1 tablet (0.4 mg total) under the tongue every 5 (five) minutes as needed for chest pain. UP TO 3 TABLETS ?  ?Ozempic (1 MG/DOSE) 4 MG/3ML Sopn ?Generic drug: Semaglutide (1 MG/DOSE) ?Inject 1 mg into the skin once a week. ?  ?telmisartan 20 MG tablet ?Commonly known as: MICARDIS ?Take 1 tablet (20 mg total) by mouth every evening. ?  ?Tyler Aas FlexTouch 200 UNIT/ML FlexTouch Pen ?Generic drug: insulin degludec ?Inject 34 Units into the skin daily in the afternoon. ?What changed:  ?how much to take ?when to take this ?  ?VITAMIN C PO ?Take 500 mg by mouth every evening. ?  ?Vitamin D (Ergocalciferol) 1.25 MG (50000 UNIT) Caps capsule ?Commonly known as: DRISDOL ?TAKE ONE CAPSULE BY MOUTH ONCE WEEKLY ?  ?Xigduo XR 01-999 MG Tb24 ?Generic drug:  Dapagliflozin-metFORMIN HCl ER ?Take 1 tablet by mouth in the morning and at bedtime. ?  ?ZINCATE PO ?Take 50 mg by mouth every evening. ?  ? ?  ? ? ? ?ALLERGIES: ?Allergies  ?Allergen Reactions  ? Lisinopril Cough  ? Atorvastatin Other (See Comments)  ? Glipizide Other (See Comments)  ? Hydrochlorothiazide Other (See Comments)  ?  pancreatitis  ? ? ? ?REVIEW OF SYSTEMS: ?A comprehensive ROS was conducted with the patient and is negative except as per HPI  ? ?  ?OBJECTIVE:  ? ?VITAL SIGNS:BP 122/78 (BP Location: Left Arm, Patient Position: Sitting, Cuff Size: Small)   Pulse 92   Ht '5\' 1"'$  (1.549  m)   Wt 158 lb (71.7 kg)   SpO2 96%   BMI 29.85 kg/m?  ? ? ?PHYSICAL EXAM:  ?General: Pt appears well and is in NAD  ?Neck: General: Supple without adenopathy or carotid bruits. ?Thyroid: Thyroid size normal.  No goiter or nodules appreciated.  ?Lungs: Clear with good BS bilat with no rales, rhonchi, or wheezes  ?Heart: RRR with normal S1 and S2 and no gallops; no murmurs; no rub  ?Abdomen: Normoactive bowel sounds, soft, nontender, without masses or organomegaly palpable  ?Extremities:  ?Lower extremities - No pretibial edema. No lesions.  ?Neuro: MS is good with appropriate affect, pt is alert and Ox3  ? ? ?DM foot exam: 01/20/2022 ? ?The skin of the feet is intact without sores or ulcerations. ?The pedal pulses are 2+ on right and 2+ on left. ?The sensation is decreased  to a screening 5.07, 10 gram monofilament bilaterally ? ? ?DATA REVIEWED: ? ?Lab Results  ?Component Value Date  ? HGBA1C 6.2 (H) 12/12/2021  ? HGBA1C 7.8 (H) 09/05/2021  ? HGBA1C 8.5 (H) 05/12/2021  ? ?Lab Results  ?Component Value Date  ? MICROALBUR 150 12/02/2020  ? [place] 65 11/01/2021  ? CREATININE 1.31 (H) 12/12/2021  ? ?Lab Results  ?Component Value Date  ? MICRALBCREAT 201 (H) 12/12/2021  ? ? ?Lab Results  ?Component Value Date  ? CHOL 122 11/01/2021  ? HDL 36 (L) 11/01/2021  ? [place] 65 11/01/2021  ? TRIG 116 11/01/2021  ? CHOLHDL 3.4  11/01/2021  ?     ? ?ASSESSMENT / PLAN / RECOMMENDATIONS:  ? ?1) Type 2 Diabetes Mellitus, optimally controlled, With Neuropathic complications - Most recent A1c of 6.2 %. Goal A1c < 7.0 %.   ? ? ?- Praised pt on the weight loss

## 2022-01-20 NOTE — Patient Instructions (Addendum)
Keep Up the Good Work ! ?Continue Xigduo 01-999 mg, 2 tablets at night  ?Continue  Ozempic 1 mg weekly  ?Decrease Tresiba  20 units daily  ? ? ?HOW TO TREAT LOW BLOOD SUGARS (Blood sugar LESS THAN 70 MG/DL) ?Please follow the RULE OF 15 for the treatment of hypoglycemia treatment (when your (blood sugars are less than 70 mg/dL)  ? ?STEP 1: Take 15 grams of carbohydrates when your blood sugar is low, which includes:  ?3-4 GLUCOSE TABS  OR ?3-4 OZ OF JUICE OR REGULAR SODA OR ?ONE TUBE OF GLUCOSE GEL   ? ?STEP 2: RECHECK blood sugar in 15 MINUTES ?STEP 3: If your blood sugar is still low at the 15 minute recheck --> then, go back to STEP 1 and treat AGAIN with another 15 grams of carbohydrates. ? ?

## 2022-01-24 ENCOUNTER — Emergency Department (HOSPITAL_BASED_OUTPATIENT_CLINIC_OR_DEPARTMENT_OTHER): Payer: BC Managed Care – PPO

## 2022-01-24 ENCOUNTER — Encounter (HOSPITAL_BASED_OUTPATIENT_CLINIC_OR_DEPARTMENT_OTHER): Payer: Self-pay | Admitting: Emergency Medicine

## 2022-01-24 ENCOUNTER — Emergency Department (HOSPITAL_BASED_OUTPATIENT_CLINIC_OR_DEPARTMENT_OTHER)
Admission: EM | Admit: 2022-01-24 | Discharge: 2022-01-24 | Disposition: A | Discharge status: Home / Self Care | Payer: BC Managed Care – PPO | Attending: Emergency Medicine | Admitting: Emergency Medicine

## 2022-01-24 ENCOUNTER — Other Ambulatory Visit: Payer: Self-pay

## 2022-01-24 DIAGNOSIS — N183 Chronic kidney disease, stage 3 unspecified: Secondary | ICD-10-CM | POA: Insufficient documentation

## 2022-01-24 DIAGNOSIS — M546 Pain in thoracic spine: Secondary | ICD-10-CM | POA: Insufficient documentation

## 2022-01-24 DIAGNOSIS — Z7982 Long term (current) use of aspirin: Secondary | ICD-10-CM | POA: Diagnosis not present

## 2022-01-24 MED ORDER — LIDOCAINE 5 % EX PTCH
1.0000 | MEDICATED_PATCH | CUTANEOUS | Status: DC
  Administered 2022-01-24: 1 via TRANSDERMAL
  Filled 2022-01-24: qty 1

## 2022-01-24 MED ORDER — METHOCARBAMOL 500 MG PO TABS: 500.0000 mg | ORAL_TABLET | Freq: Two times a day (BID) | ORAL | 0 refills | Status: DC

## 2022-01-24 NOTE — ED Notes (Signed)
Pt NAD, a/ox4. Pt verbalizes understanding of all DC and f/u instructions. All questions answered. Pt walks with steady gait to lobby at DC.  ? ?

## 2022-01-24 NOTE — ED Notes (Signed)
Patient transported to X-ray 

## 2022-01-24 NOTE — ED Triage Notes (Signed)
Midback pain x 1 month. Denies any injury.  ?

## 2022-01-24 NOTE — Discharge Instructions (Signed)
Please pick up medication and take as prescribed. DO NOT DRIVE OR DRINK ALCOHOL WHILE TAKING THIS MEDICATION AS IT CAN MAKE YOU DROWSY.  ? ?You can buy OTC Salon Pas patches to apply to your back as well. Apply heat as needed for symptomatic relief.  ? ?Follow up with your PCP for further eval ? ?Return to the ED for any new/worsening symptoms ?

## 2022-01-24 NOTE — ED Provider Notes (Signed)
?[facility] ?Provider Note ? ? ?CSN: [id number] ?Arrival date & time: 01/24/22  2030 ? ?  ? ?History ? ?Chief Complaint  ?Patient presents with  ? Back Pain  ? ? ?Teresa Grant is a 61 y.o. female who presents to the ED today with complaint of gradual onset, constant, achy, upper back pain x 1 month.  Patient reports that she has not been seen for same.  She has not tried any medications over-the-counter for relief.  She reports that she has not mentioned it to her PCP because she is stubborn.  She states that over the weekend her pain became worse.  She states that she asked her family member to step all over her back and states that it was painful however it was a good pain.  She states since that time her pain has continued to worsen however prompting ED visit today.  Denies any trauma to the back.  She does report that she works quite frequently with her arms at work.  She has no other complaints at this time.  ? ?The history is provided by the patient and medical records.  ? ?  ? ?Home Medications ?Prior to Admission medications   ?Medication Sig Start Date End Date Taking? Authorizing Provider  ?amLODipine (NORVASC) 10 MG tablet TAKE ONE TABLET BY MOUTH DAILY 12/07/21  Yes Minette Brine, FNP  ?Ascorbic Acid (VITAMIN C PO) Take 500 mg by mouth every evening.   Yes [provider]  ?aspirin EC 81 MG tablet Take 1 tablet (81 mg total) by mouth daily. Swallow whole. 12/05/21  Yes Loel Dubonnet, NP  ?Aspirin-Salicylamide-Caffeine (BC FAST PAIN RELIEF) 650-195-33.3 MG PACK Take 1 packet by mouth daily as needed (pain.).   Yes [provider]  ?atorvastatin (LIPITOR) 20 MG tablet Take 1 tablet (20 mg total) by mouth daily. ?Patient taking differently: Take 20 mg by mouth every evening. 06/15/21  Yes Shamleffer, Melanie Crazier, MD  ?Dapagliflozin-metFORMIN HCl ER (XIGDUO XR) 01-999 MG TB24 Take 1 tablet by mouth in the morning and at bedtime. 11/06/21  Yes End,  Harrell Gave, MD  ?famotidine (PEPCID) 20 MG tablet TAKE ONE TABLET BY MOUTH TWICE A DAY 12/07/21  Yes Minette Brine, FNP  ?gabapentin (NEURONTIN) 100 MG capsule TAKE ONE CAPSULE BY MOUTH THREE TIMES A DAY ?Patient taking differently: 100 mg 2 (two) times daily. 08/15/21  Yes Glendale Chard, MD  ?glucose blood (ONETOUCH VERIO) test strip Use as directed to check blood sugars 2 times per day dx: e11.65 09/13/18  Yes Glendale Chard, MD  ?insulin degludec (TRESIBA FLEXTOUCH) 200 UNIT/ML FlexTouch Pen Inject 34 Units into the skin daily in the afternoon. ?Patient taking differently: Inject 26 Units into the skin at bedtime. 06/15/21  Yes Shamleffer, Melanie Crazier, MD  ?methocarbamol (ROBAXIN) 500 MG tablet Take 1 tablet (500 mg total) by mouth 2 (two) times daily. 01/24/22  Yes Shavon Ashmore, PA-C  ?nitroGLYCERIN (NITROSTAT) 0.4 MG SL tablet Place 1 tablet (0.4 mg total) under the tongue every 5 (five) minutes as needed for chest pain. UP TO 3 TABLETS 10/31/21 01/29/22 Yes Skeet Latch, MD  ?Semaglutide, 1 MG/DOSE, (OZEMPIC, 1 MG/DOSE,) 4 MG/3ML SOPN Inject 1 mg into the skin once a week. 09/16/21  Yes Shamleffer, Melanie Crazier, MD  ?telmisartan (MICARDIS) 20 MG tablet Take 1 tablet (20 mg total) by mouth every evening. 11/11/21  Yes Glendale Chard, MD  ?Vitamin D, Ergocalciferol, (DRISDOL) 1.25 MG (50000 UNIT) CAPS capsule TAKE ONE CAPSULE BY MOUTH  ONCE WEEKLY 07/15/21  Yes Glendale Chard, MD  ?Zinc Sulfate (ZINCATE PO) Take 50 mg by mouth every evening.   Yes [provider]  ?   ? ?Allergies    ?Lisinopril, Atorvastatin, Glipizide, and Hydrochlorothiazide   ? ?Review of Systems   ?Review of Systems  ?Constitutional:  Negative for chills and fever.  ?Musculoskeletal:  Positive for back pain.  ?Neurological:  Negative for weakness and numbness.  ?All other systems reviewed and are negative. ? ?Physical Exam ?Updated Vital Signs ?BP (!) 136/91 (BP Location: Left Arm)   Pulse 86   Temp 98.2 ?F (36.8 ?C)  (Oral)   Resp 18   Ht '5\' 1"'$  (1.549 m)   Wt 71.2 kg   SpO2 100%   BMI 29.66 kg/m?  ?Physical Exam ?Vitals and nursing note reviewed.  ?Constitutional:   ?   Appearance: She is not ill-appearing.  ?HENT:  ?   Head: Normocephalic and atraumatic.  ?Eyes:  ?   Conjunctiva/sclera: Conjunctivae normal.  ?Cardiovascular:  ?   Rate and Rhythm: Normal rate and regular rhythm.  ?Pulmonary:  ?   Effort: Pulmonary effort is normal.  ?   Breath sounds: Normal breath sounds.  ?Musculoskeletal:  ?   Cervical back: No tenderness.  ?     Back: ? ?   Comments: Diffuse bilateral parathoracic musculature TTP with associated midline T spine TTP. No step offs or deformities. ROM Intact to back and neck. Grip strength 5/5 bilaterally. Sensation intact throughout.   ?Skin: ?   General: Skin is warm and dry.  ?   Coloration: Skin is not jaundiced.  ?Neurological:  ?   Mental Status: She is alert.  ? ? ?ED Results / Procedures / Treatments   ?Labs ?(all labs ordered are listed, but only abnormal results are displayed) ?Labs Reviewed - No data to display ? ?EKG ?None ? ?Radiology ?DG Thoracic Spine 2 View ? ?Result Date: 01/24/2022 ?CLINICAL DATA:  Back pain for 1 month, no known injury, initial encounter EXAM: THORACIC SPINE 2 VIEWS COMPARISON:  None Available. FINDINGS: Vertebral body height is well maintained. Mild osteophytic changes are noted. Postsurgical changes in the cervical spine are seen. No pedicle abnormality or paraspinal mass is seen. IMPRESSION: Mild degenerative change without acute abnormality. Electronically Signed   By: Inez Catalina M.D.   On: 01/24/2022 21:53   ? ?Procedures ?Procedures  ? ? ?Medications Ordered in ED ?Medications  ?lidocaine (LIDODERM) 5 % 1 patch (1 patch Transdermal Patch Applied 01/24/22 2145)  ? ? ?ED Course/ Medical Decision Making/ A&P ?  ?                        ?Medical Decision Making ?61 year old female who presents to the ED today with complaint of atraumatic diffuse upper back pain x1 month.   On arrival to the ED vitals are stable.  Patient appears to be in no acute distress.  She is noted to have bilateral parathoracic musculature tenderness palpation with associated midline thoracic tenderness palpation.  No step-offs or deformities.  Equal strength and sensation to bilateral upper extremities.  No lower back pain.  Pain is worsened with movement of her arms.  Does appear to be musculoskeletal in nature at this time.  However given she has not been evaluated for same in the past we will plan for x-ray of T-spine for further eval.  She did unfortunately drive herself to the ED today and therefore pain medication is  limited.  She also endorses a history of chronic kidney disease stage III.  We will hold off on Toradol at this time.  Given she drove herself here I do not think a narcotic or muscle relaxer is appropriate at this time however I do think a muscle relaxer would significantly benefit patient in the outpatient setting.  Will provide Lidoderm patch and reevaluate. ? ?Xray negative for acute findings. Does show some DDD. Will plan to discharge home at this time with PCP follow up and Rx muscle relaxers. Pt in agreement with plan and stable for discharge home.  ? ?Problems Addressed: ?Acute bilateral thoracic back pain: acute illness or injury ? ?Amount and/or Complexity of Data Reviewed ?Radiology: ordered. ? ?Risk ?Prescription drug management. ? ? ? ? ? ? ? ? ? ?Final Clinical Impression(s) / ED Diagnoses ?Final diagnoses:  ?Acute bilateral thoracic back pain  ? ? ?Rx / DC Orders ?ED Discharge Orders   ? ?      Ordered  ?  methocarbamol (ROBAXIN) 500 MG tablet  2 times daily       ? 01/24/22 2225  ? ?  ?  ? ?  ? ? ? ?Discharge Instructions   ? ?  ?Please pick up medication and take as prescribed. DO NOT DRIVE OR DRINK ALCOHOL WHILE TAKING THIS MEDICATION AS IT CAN MAKE YOU DROWSY.  ? ?You can buy OTC Salon Pas patches to apply to your back as well. Apply heat as needed for symptomatic relief.   ? ?Follow up with your PCP for further eval ? ?Return to the ED for any new/worsening symptoms ? ? ? ? ?  ?Eustaquio Maize, PA-C ?01/24/22 2226 ? ?  ?Davonna Belling, MD ?01/24/22 2336 ? ?

## 2022-02-10 ENCOUNTER — Other Ambulatory Visit: Payer: Self-pay | Admitting: Internal Medicine

## 2022-02-14 ENCOUNTER — Other Ambulatory Visit: Payer: Self-pay

## 2022-02-14 MED ORDER — FAMOTIDINE 20 MG PO TABS: 20.0000 mg | ORAL_TABLET | Freq: Two times a day (BID) | ORAL | 1 refills | Status: DC

## 2022-04-03 NOTE — Progress Notes (Signed)
Cardiology Office Note  Date:  04/04/2022   ID:  [name], DOB 11/10/1960, MRN [id number]  PCP:  Glendale Chard, MD  Cardiologist:   Skeet Latch, MD   No chief complaint on file.  History of Present Illness: Teresa Grant is a 61 y.o. female with nonobstructive CAD, hypertension, diabetes, hyperlipidemia, and family history of CAD who is being seen today for follow up. She was initially seen 10/31/21 for the evaluation of chest pain at the request of Glendale Chard, MD. She saw Dr. Baird Cancer 08/2021 and reported chest discomfort that she thought was likely GI related.  However given her cardiovascular risk factors she was referred to cardiology.  EKG at that time was unremarkable.    At her last appointment she reported sharp, sternal chest pain radiating inferior to the left breast. This intermittently occurred while walking or bending over to pick things up, associated with shortness of breath and occasional lightheadedness. She had an Echo in 2015 that revealed LVEF 60-65%. She was started on ASA 81 mg daily and given nitroglycerin. She underwent cardiac catheterization 11/03/2021 which showed minimal plaquing of the proximal (10%) and mid (5%) RCA without angiographically significant stenosis to explain the patient's chest pain. She followed up with Laurann Montana, NP on 12/05/21. Considering musculoskeletal or GERD etiology of her chest pain, she was encouraged to take Pepcid twice daily and follow up with her PCP. She was started on Aspirin EC 81 mg QD.  Today, she is feeling okay. Every now and then she continues to have intermittent sharp chest pain, but not as frequently as before. Most recently this occurred last week while cleaning. She is not sure if her pain could be related to acid reflux as she typically does not eat very much. She also complains of pain and swelling in her ankles and hands which she attributes to arthritis. For exercise she has started walking about 3.5  miles in her neighborhood; she tries to walk every day but lately has been unable to due to caring for her family and inclement weather. She denies any palpitations, shortness of breath. No lightheadedness, headaches, syncope, orthopnea, or PND.  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   Diabetes mellitus    Dysrhythmia    1985   Heart murmur    HLD (hyperlipidemia)    Hypertension    Kidney stone on right side 03/2013   Pneumonia    7 yrs ago    Past Surgical History:  Procedure Laterality Date   ABDOMINAL HYSTERECTOMY  2001   BACK SURGERY     CERVICAL SPINE SURGERY  06/06/2019   Dr. Christella Noa, performed at surgical center   CESAREAN SECTION  1990   COLONOSCOPY     Dr. Deatra Ina normal   LEFT HEART CATH AND CORONARY ANGIOGRAPHY N/A 11/03/2021   Procedure: LEFT HEART CATH AND CORONARY ANGIOGRAPHY;  Surgeon: Nelva Bush, MD;  Location: [facility];  Service: Cardiovascular;  Laterality: N/A;   LUMBAR LAMINECTOMY/DECOMPRESSION MICRODISCECTOMY  03/20/2012   Procedure: LUMBAR LAMINECTOMY/DECOMPRESSION MICRODISCECTOMY;  Surgeon: Sinclair Ship, MD;  Location: [facility];  Service: Orthopedics;  Laterality: Left;  Left sided lumbar 4-5 microdisectomy   SPINE SURGERY  2013     Current Outpatient Medications  Medication Sig Dispense Refill   amLODipine (NORVASC) 10 MG tablet TAKE ONE TABLET BY MOUTH DAILY 90 tablet 1   Ascorbic Acid (VITAMIN C PO) Take 500 mg by mouth every evening.  aspirin EC 81 MG tablet Take 1 tablet (81 mg total) by mouth daily. Swallow whole. 90 tablet 3   Aspirin-Salicylamide-Caffeine (BC FAST PAIN RELIEF) [phone] MG PACK Take 1 packet by mouth daily as needed (pain.).     atorvastatin (LIPITOR) 20 MG tablet Take 1 tablet (20 mg total) by mouth daily. (Patient taking differently: Take 20 mg by mouth every evening.) 90 tablet 3   Dapagliflozin-metFORMIN HCl ER (XIGDUO XR) 01-999 MG TB24 Take 1 tablet by mouth in the  morning and at bedtime.     famotidine (PEPCID) 20 MG tablet Take 1 tablet (20 mg total) by mouth 2 (two) times daily. 180 tablet 1   gabapentin (NEURONTIN) 100 MG capsule TAKE ONE CAPSULE BY MOUTH THREE TIMES A DAY (Patient taking differently: 100 mg 2 (two) times daily.) 270 capsule 1   glucose blood (ONETOUCH VERIO) test strip Use as directed to check blood sugars 2 times per day dx: e11.65 100 each 10   insulin degludec (TRESIBA FLEXTOUCH) 200 UNIT/ML FlexTouch Pen Inject 34 Units into the skin daily in the afternoon. (Patient taking differently: Inject 26 Units into the skin at bedtime.) 15 mL 6   methocarbamol (ROBAXIN) 500 MG tablet Take 1 tablet (500 mg total) by mouth 2 (two) times daily. 20 tablet 0   nitroGLYCERIN (NITROSTAT) 0.4 MG SL tablet Place 1 tablet (0.4 mg total) under the tongue every 5 (five) minutes as needed for chest pain. UP TO 3 TABLETS 25 tablet PRN   Semaglutide, 1 MG/DOSE, (OZEMPIC, 1 MG/DOSE,) 4 MG/3ML SOPN Inject 1 mg into the skin once a week. 9 mL 2   telmisartan (MICARDIS) 20 MG tablet TAKE ONE TABLET BY MOUTH EVERY EVENING 90 tablet 1   Vitamin D, Ergocalciferol, (DRISDOL) 1.25 MG (50000 UNIT) CAPS capsule TAKE ONE CAPSULE BY MOUTH ONCE WEEKLY 12 capsule 1   Zinc Sulfate (ZINCATE PO) Take 50 mg by mouth every evening.     No current facility-administered medications for this visit.    Allergies:   Lisinopril, Atorvastatin, Glipizide, and Hydrochlorothiazide    Social History:  The patient  reports that she quit smoking about 17 years ago. Her smoking use included cigarettes. She has a 12.50 pack-year smoking history. She has never used smokeless tobacco. She reports current alcohol use. She reports that she does not use drugs.   Family History:  The patient's family history includes Colon cancer in her cousin; Diabetes in her paternal aunt and paternal uncle; Esophageal cancer in her maternal uncle; Heart attack (age of onset: 60) in her mother; Heart disease  in her mother; Hypertension in her brother, father, maternal aunt, maternal uncle, mother, and sister; Kidney disease in her father; Stroke in her mother.    ROS:   Please see the history of present illness.    (+) Sharp chest pain (+) Arthralgias (+) Edema of bilateral hands and feet All other systems are reviewed and negative.    PHYSICAL EXAM:  VS:  BP 110/76 (BP Location: Right Arm, Patient Position: Sitting, Cuff Size: Normal)   Pulse 82   Ht '5\' 1"'$  (1.549 m)   Wt 155 lb 12.8 oz (70.7 kg)   SpO2 98%   BMI 29.44 kg/m  , BMI Body mass index is 29.44 kg/m. GENERAL:  Well appearing HEENT:  Pupils equal round and reactive, fundi not visualized, oral mucosa unremarkable NECK:  No jugular venous distention, waveform within normal limits, carotid upstroke brisk and symmetric, no bruits, no thyromegaly LUNGS:  Clear  to auscultation bilaterally HEART:  RRR.  PMI not displaced or sustained,S1 and S2 within normal limits, no S3, no S4, no clicks, no rubs, no murmurs ABD:  Flat, positive bowel sounds normal in frequency in pitch, no bruits, no rebound, no guarding, no midline pulsatile mass, no hepatomegaly, no splenomegaly EXT:  2 plus pulses throughout, no edema, no cyanosis no clubbing SKIN:  No rashes no nodules NEURO:  Cranial nerves II through XII grossly intact, motor grossly intact throughout PSYCH:  Cognitively intact, oriented to person place and time   EKG:  EKG is personally reviewed. 04/04/2022:  EKG was not ordered. 10/31/2021: sinus rhythm.  Rate 84 bpm.   Left Heart Cath  11/03/2021: Conclusions: Minimal plaquing of the proximal and mid RCA without angiographically significant stenosis in any coronary artery to explain the patient's chest pain. Normal left ventricular filling pressure.   Recommendations: Primary prevention of coronary artery disease. Consider echocardiogram to assess LVEF; left ventriculogram not performed today in the setting of chronic kidney  disease.  Diagnostic: Dominance: Right   Echo 07/11/2014: Study Conclusions   - Left ventricle: The cavity size was normal. There was mild focal    basal hypertrophy of the septum. Systolic function was normal.    The estimated ejection fraction was in the range of 60% to 65%.    Images were inadequate for LV wall motion assessment.   Recent Labs: 11/01/2021: ALT 12; Hemoglobin 12.5; Platelets 264 12/12/2021: BUN 16; Creatinine, Ser 1.31; Potassium 4.1; Sodium 142    Lipid Panel    Component Value Date/Time   CHOL 122 11/01/2021 0824   TRIG 116 11/01/2021 0824   HDL 36 (L) 11/01/2021 0824   CHOLHDL 3.4 11/01/2021 0824   CHOLHDL 2.8 09/13/2012 1156   VLDL 14 09/13/2012 1156   LDLCALC 65 11/01/2021 0824      Wt Readings from Last 3 Encounters:  04/04/22 155 lb 12.8 oz (70.7 kg)  01/24/22 157 lb (71.2 kg)  01/20/22 158 lb (71.7 kg)      ASSESSMENT AND PLAN:  Essential hypertension Blood pressure is very well controlled on her current regimen.  She was congratulated on her regular exercise.  Continue amlodipine and telmisartan.  Hyperlipidemia Lipids are well controlled on atorvastatin.  LDL goal is less than 70 given that she does have some CAD.  CAD in native artery Very minimal plaque was seen at the time of her cath.  She had 5 to 10% stenoses in the RCA.  Lipids are well controlled and she is exercising regularly.  Continue aspirin, atorvastatin, and continue to manage blood pressure as above.    Current medicines are reviewed at length with the patient today.  The patient does not have concerns regarding medicines.  The following changes have been made: Start aspirin and nitroglycerin  Labs/ tests ordered today include:   No orders of the defined types were placed in this encounter.    Disposition:   FU with Jerrye Seebeck C. Oval Linsey, MD, [facility] as needed.   I,Mathew Stumpf,acting as a Education administrator for Skeet Latch, MD.,have documented all relevant documentation  on the behalf of Skeet Latch, MD,as directed by  Skeet Latch, MD while in the presence of Skeet Latch, MD.  I, Dutton Oval Linsey, MD have reviewed all documentation for this visit.  The documentation of the exam, diagnosis, procedures, and orders on 04/04/2022 are all accurate and complete.   Signed, Azael Ragain C. Oval Linsey, MD, [facility]  04/04/2022 8:16 AM    [facility] 

## 2022-04-04 ENCOUNTER — Ambulatory Visit (INDEPENDENT_AMBULATORY_CARE_PROVIDER_SITE_OTHER): Payer: BC Managed Care – PPO | Admitting: Cardiovascular Disease

## 2022-04-04 ENCOUNTER — Ambulatory Visit (HOSPITAL_BASED_OUTPATIENT_CLINIC_OR_DEPARTMENT_OTHER): Payer: BC Managed Care – PPO | Admitting: Cardiovascular Disease

## 2022-04-04 ENCOUNTER — Encounter (HOSPITAL_BASED_OUTPATIENT_CLINIC_OR_DEPARTMENT_OTHER): Payer: Self-pay | Admitting: Cardiovascular Disease

## 2022-04-04 DIAGNOSIS — E782 Mixed hyperlipidemia: Secondary | ICD-10-CM

## 2022-04-04 DIAGNOSIS — I10 Essential (primary) hypertension: Secondary | ICD-10-CM

## 2022-04-04 DIAGNOSIS — I2510 Atherosclerotic heart disease of native coronary artery without angina pectoris: Secondary | ICD-10-CM | POA: Diagnosis not present

## 2022-04-04 DIAGNOSIS — I25118 Atherosclerotic heart disease of native coronary artery with other forms of angina pectoris: Secondary | ICD-10-CM | POA: Insufficient documentation

## 2022-04-04 HISTORY — DX: Atherosclerotic heart disease of native coronary artery without angina pectoris: I25.10

## 2022-04-04 NOTE — Assessment & Plan Note (Signed)
Blood pressure is very well controlled on her current regimen.  She was congratulated on her regular exercise.  Continue amlodipine and telmisartan.

## 2022-04-04 NOTE — Assessment & Plan Note (Signed)
Lipids are well controlled on atorvastatin.  LDL goal is less than 70 given that she does have some CAD.

## 2022-04-04 NOTE — Patient Instructions (Signed)
Medication Instructions:  ?Your physician recommends that you continue on your current medications as directed. Please refer to the Current Medication list given to you today.  ? ?Labwork: ?NONE ? ?Testing/Procedures: ?NONE ? ?Follow-Up: ?AS NEEDED  ? ?  ?

## 2022-04-04 NOTE — Assessment & Plan Note (Signed)
Very minimal plaque was seen at the time of her cath.  She had 5 to 10% stenoses in the RCA.  Lipids are well controlled and she is exercising regularly.  Continue aspirin, atorvastatin, and continue to manage blood pressure as above.

## 2022-05-04 ENCOUNTER — Other Ambulatory Visit: Payer: Self-pay | Admitting: Internal Medicine

## 2022-05-10 ENCOUNTER — Other Ambulatory Visit: Payer: Self-pay

## 2022-05-10 MED ORDER — AMLODIPINE BESYLATE 10 MG PO TABS: 10.0000 mg | ORAL_TABLET | Freq: Every day | ORAL | 1 refills | Status: DC

## 2022-06-23 ENCOUNTER — Other Ambulatory Visit: Payer: Self-pay | Admitting: Internal Medicine

## 2022-07-19 ENCOUNTER — Emergency Department (HOSPITAL_BASED_OUTPATIENT_CLINIC_OR_DEPARTMENT_OTHER)
Admission: EM | Admit: 2022-07-19 | Discharge: 2022-07-19 | Disposition: A | Discharge status: Home / Self Care | Payer: BC Managed Care – PPO | Attending: Emergency Medicine | Admitting: Emergency Medicine

## 2022-07-19 ENCOUNTER — Encounter (HOSPITAL_BASED_OUTPATIENT_CLINIC_OR_DEPARTMENT_OTHER): Payer: Self-pay

## 2022-07-19 ENCOUNTER — Emergency Department (HOSPITAL_BASED_OUTPATIENT_CLINIC_OR_DEPARTMENT_OTHER): Payer: BC Managed Care – PPO

## 2022-07-19 DIAGNOSIS — R0602 Shortness of breath: Secondary | ICD-10-CM | POA: Insufficient documentation

## 2022-07-19 DIAGNOSIS — R1011 Right upper quadrant pain: Secondary | ICD-10-CM | POA: Diagnosis not present

## 2022-07-19 DIAGNOSIS — R112 Nausea with vomiting, unspecified: Secondary | ICD-10-CM | POA: Diagnosis not present

## 2022-07-19 DIAGNOSIS — Z7982 Long term (current) use of aspirin: Secondary | ICD-10-CM | POA: Insufficient documentation

## 2022-07-19 DIAGNOSIS — R1013 Epigastric pain: Secondary | ICD-10-CM | POA: Diagnosis not present

## 2022-07-19 DIAGNOSIS — R509 Fever, unspecified: Secondary | ICD-10-CM | POA: Diagnosis not present

## 2022-07-19 DIAGNOSIS — Z79899 Other long term (current) drug therapy: Secondary | ICD-10-CM | POA: Insufficient documentation

## 2022-07-19 DIAGNOSIS — Z794 Long term (current) use of insulin: Secondary | ICD-10-CM | POA: Insufficient documentation

## 2022-07-19 DIAGNOSIS — Z20822 Contact with and (suspected) exposure to covid-19: Secondary | ICD-10-CM | POA: Insufficient documentation

## 2022-07-19 LAB — CBC WITH DIFFERENTIAL/PLATELET
Abs Immature Granulocytes: 0.01 10*3/uL (ref 0.00–0.07)
Basophils Absolute: 0 10*3/uL (ref 0.0–0.1)
Basophils Relative: 0 %
Eosinophils Absolute: 0 10*3/uL (ref 0.0–0.5)
Eosinophils Relative: 1 %
HCT: 37.8 % (ref 36.0–46.0)
Hemoglobin: 12 g/dL (ref 12.0–15.0)
Immature Granulocytes: 0 %
Lymphocytes Relative: 22 %
Lymphs Abs: 1.4 10*3/uL (ref 0.7–4.0)
MCH: 26.8 pg (ref 26.0–34.0)
MCHC: 31.7 g/dL (ref 30.0–36.0)
MCV: 84.6 fL (ref 80.0–100.0)
Monocytes Absolute: 0.3 10*3/uL (ref 0.1–1.0)
Monocytes Relative: 5 %
Neutro Abs: 4.5 10*3/uL (ref 1.7–7.7)
Neutrophils Relative %: 72 %
Platelets: 261 10*3/uL (ref 150–400)
RBC: 4.47 MIL/uL (ref 3.87–5.11)
RDW: 14.3 % (ref 11.5–15.5)
WBC: 6.2 10*3/uL (ref 4.0–10.5)
nRBC: 0 % (ref 0.0–0.2)

## 2022-07-19 LAB — COMPREHENSIVE METABOLIC PANEL
ALT: 11 U/L (ref 0–44)
AST: 22 U/L (ref 15–41)
Albumin: 4.5 g/dL (ref 3.5–5.0)
Alkaline Phosphatase: 127 U/L — ABNORMAL HIGH (ref 38–126)
Anion gap: 13 (ref 5–15)
BUN: 18 mg/dL (ref 8–23)
CO2: 22 mmol/L (ref 22–32)
Calcium: 9.9 mg/dL (ref 8.9–10.3)
Chloride: 104 mmol/L (ref 98–111)
Creatinine, Ser: 1.28 mg/dL — ABNORMAL HIGH (ref 0.44–1.00)
GFR, Estimated: 48 mL/min — ABNORMAL LOW (ref >60)
Glucose, Bld: 100 mg/dL — ABNORMAL HIGH (ref 70–99)
Potassium: 4.5 mmol/L (ref 3.5–5.1)
Sodium: 139 mmol/L (ref 135–145)
Total Bilirubin: 0.5 mg/dL (ref 0.3–1.2)
Total Protein: 7.9 g/dL (ref 6.5–8.1)

## 2022-07-19 LAB — LIPASE, BLOOD: Lipase: 64 U/L — ABNORMAL HIGH (ref 11–51)

## 2022-07-19 LAB — RESP PANEL BY RT-PCR (FLU A&B, COVID) ARPGX2
Influenza A by PCR: NEGATIVE
Influenza B by PCR: NEGATIVE
SARS Coronavirus 2 by RT PCR: NEGATIVE

## 2022-07-19 LAB — GLUCOSE, CAPILLARY: Glucose-Capillary: 150 mg/dL — ABNORMAL HIGH (ref 70–99)

## 2022-07-19 MED ORDER — ONDANSETRON 4 MG PO TBDP
4.0000 mg | ORAL_TABLET | Freq: Once | ORAL | Status: AC
Start: 2022-07-19 — End: 2022-07-19
  Administered 2022-07-19: 4 mg via ORAL
  Filled 2022-07-19: qty 1

## 2022-07-19 MED ORDER — ONDANSETRON HCL 4 MG PO TABS: 4.0000 mg | ORAL_TABLET | Freq: Three times a day (TID) | ORAL | 0 refills | Status: DC | PRN

## 2022-07-19 NOTE — ED Provider Notes (Signed)
Signout from Dr. Tyrone Nine.  61 year old female here with nausea vomiting.  Initially had some trouble breathing afterwards but primarily nausea vomiting.  She is pending some LFTs and lipase.  If these are unremarkable she can be discharged after p.o. trial. Physical Exam  BP 115/77   Pulse 77   Temp 98 F (36.7 C)   Resp 19   Ht '5\' 1"'$  (1.549 m)   Wt 69.9 kg   SpO2 95%   BMI 29.10 kg/m   Physical Exam  Procedures  Procedures  ED Course / MDM    Medical Decision Making Amount and/or Complexity of Data Reviewed Labs: ordered. Radiology: ordered.  Risk Prescription drug management.   Patient's LFTs are unremarkable.  I evaluated patient and she was feeling well with stable vitals.  She is comfortable plan for discharge and recommended close follow-up with PCP.  Return instructions discussed       Hayden Rasmussen, MD 07/20/22 [phone]

## 2022-07-19 NOTE — Discharge Instructions (Addendum)
You are seen in the emergency department for nausea vomiting shortness of breath.  You had lab work chest x-ray and an EKG that did not show any significant abnormalities.  We are sending a prescription to the pharmacy for some nausea medication.  Please start with a clear liquid diet advance as tolerated.  Follow-up with your regular doctor.  Return to the emergency department if any worsening or concerning symptoms.

## 2022-07-19 NOTE — ED Notes (Signed)
Called lab to inquire about cmp and lipase. Delay related to machine maintenance.

## 2022-07-19 NOTE — ED Notes (Signed)
Pt ambulated to BR

## 2022-07-19 NOTE — ED Triage Notes (Signed)
C/o nausea, shortness of breath, headache, fatigue, diarrhea today. States had episode of dry heaving and became short of breath after episode. Denies symptoms currently. Denies chest pain.

## 2022-07-19 NOTE — ED Notes (Signed)
Discharge instructions reviewed with patient. Patient verbalizes understanding, no further questions at this time. Medications/prescriptions and follow up information provided. No acute distress noted at time of departure.  

## 2022-07-19 NOTE — ED Provider Notes (Signed)
[facility] DEPARTMENT Provider Note   CSN: [id number] Arrival date & time: 07/19/22  1147     History {Add pertinent medical, surgical, social history, OB history to HPI:1} Chief Complaint  Patient presents with   Shortness of Breath    Teresa Grant is a 61 y.o. female.  61 yo F with a chief complaints of abdominal pain nausea vomiting.  This has been going on for about 3 to 4 days.  She had an episode today where she vomited and then felt she was having trouble catching her breath afterwards.  This is resolved.  She feels like the pain is worse to the upper portion of her abdomen.  Some subjective fevers and chills at home.   Shortness of Breath      Home Medications Prior to Admission medications   Medication Sig Start Date End Date Taking? Authorizing Provider  amLODipine (NORVASC) 10 MG tablet Take 1 tablet (10 mg total) by mouth daily. 05/10/22   Minette Brine, FNP  Ascorbic Acid (VITAMIN C PO) Take 500 mg by mouth every evening.    [provider]  aspirin EC 81 MG tablet Take 1 tablet (81 mg total) by mouth daily. Swallow whole. 12/05/21   Loel Dubonnet, NP  Aspirin-Salicylamide-Caffeine (BC FAST PAIN RELIEF) [phone] MG PACK Take 1 packet by mouth daily as needed (pain.).    [provider]  atorvastatin (LIPITOR) 20 MG tablet TAKE ONE TABLET BY MOUTH DAILY 05/04/22   Shamleffer, Melanie Crazier, MD  Dapagliflozin-metFORMIN HCl ER (XIGDUO XR) 01-999 MG TB24 Take 1 tablet by mouth in the morning and at bedtime. 11/06/21   End, Harrell Gave, MD  famotidine (PEPCID) 20 MG tablet Take 1 tablet (20 mg total) by mouth 2 (two) times daily. 02/14/22   Glendale Chard, MD  gabapentin (NEURONTIN) 100 MG capsule TAKE ONE CAPSULE BY MOUTH THREE TIMES A DAY Patient taking differently: 100 mg 2 (two) times daily. 08/15/21   Glendale Chard, MD  glucose blood [facility] VERIO) test strip Use as directed to check blood sugars 2 times per day  dx: e11.65 09/13/18   Glendale Chard, MD  insulin degludec (TRESIBA FLEXTOUCH) 200 UNIT/ML FlexTouch Pen Inject 34 Units into the skin daily in the afternoon. Patient taking differently: Inject 26 Units into the skin at bedtime. 06/15/21   Shamleffer, Melanie Crazier, MD  methocarbamol (ROBAXIN) 500 MG tablet Take 1 tablet (500 mg total) by mouth 2 (two) times daily. 01/24/22   Alroy Bailiff, Margaux, PA-C  nitroGLYCERIN (NITROSTAT) 0.4 MG SL tablet Place 1 tablet (0.4 mg total) under the tongue every 5 (five) minutes as needed for chest pain. UP TO 3 TABLETS 10/31/21 04/04/22  Skeet Latch, MD  OZEMPIC, 1 MG/DOSE, 4 MG/3ML SOPN DIAL AND INJECT UNDER THE SKIN 1 MG WEEKLY 06/23/22   Shamleffer, Melanie Crazier, MD  telmisartan (MICARDIS) 20 MG tablet TAKE ONE TABLET BY MOUTH EVERY EVENING 02/10/22   Glendale Chard, MD  Vitamin D, Ergocalciferol, (DRISDOL) 1.25 MG (50000 UNIT) CAPS capsule TAKE ONE CAPSULE BY MOUTH ONCE WEEKLY 02/10/22   Glendale Chard, MD  Zinc Sulfate (ZINCATE PO) Take 50 mg by mouth every evening.    [provider]      Allergies    Lisinopril, Atorvastatin, Glipizide, and Hydrochlorothiazide    Review of Systems   Review of Systems  Respiratory:  Positive for shortness of breath.     Physical Exam Updated Vital Signs BP 118/77   Pulse 72   Temp 98  F (36.7 C)   Resp 20   Ht '5\' 1"'$  (1.549 m)   Wt 69.9 kg   SpO2 97%   BMI 29.10 kg/m  Physical Exam Vitals and nursing note reviewed.  Constitutional:      General: She is not in acute distress.    Appearance: She is well-developed. She is not diaphoretic.  HENT:     Head: Normocephalic and atraumatic.  Eyes:     Pupils: Pupils are equal, round, and reactive to light.  Cardiovascular:     Rate and Rhythm: Normal rate and regular rhythm.     Heart sounds: No murmur heard.    No friction rub. No gallop.  Pulmonary:     Effort: Pulmonary effort is normal.     Breath sounds: No wheezing or rales.  Abdominal:      General: There is no distension.     Palpations: Abdomen is soft.     Tenderness: There is abdominal tenderness.     Comments: The patient tells me she has no discomfort on abdominal exam but does grimace a bit when I push on the epigastrium and right upper quadrant.  No obvious Murphy sign.  Musculoskeletal:        General: No tenderness.     Cervical back: Normal range of motion and neck supple.  Skin:    General: Skin is warm and dry.  Neurological:     Mental Status: She is alert and oriented to person, place, and time.  Psychiatric:        Behavior: Behavior normal.     ED Results / Procedures / Treatments   Labs (all labs ordered are listed, but only abnormal results are displayed) Labs Reviewed  GLUCOSE, CAPILLARY - Abnormal; Notable for the following components:      Result Value   Glucose-Capillary 150 (*)    All other components within normal limits  RESP PANEL BY RT-PCR (FLU A&B, COVID) ARPGX2  CBC WITH DIFFERENTIAL/PLATELET  COMPREHENSIVE METABOLIC PANEL  LIPASE, BLOOD    EKG EKG Interpretation  Date/Time:  Wednesday July 19 2022 12:09:31 EDT Ventricular Rate:  72 PR Interval:  126 QRS Duration: 88 QT Interval:  382 QTC Calculation: 418 R Axis:   59 Text Interpretation: Normal sinus rhythm Nonspecific T wave abnormality Abnormal ECG When compared with ECG of 29-Aug-2016 14:06, PREVIOUS ECG IS PRESENT No significant change since last tracing Confirmed by Deno Etienne [phone]) on 07/19/2022 12:36:06 PM  Radiology DG Chest 2 View  Result Date: 07/19/2022 CLINICAL DATA:  Shortness of breath EXAM: CHEST - 2 VIEW COMPARISON:  03/02/2019 FINDINGS: The heart size and mediastinal contours are within normal limits. Both lungs are clear. The visualized skeletal structures are unremarkable. IMPRESSION: No active cardiopulmonary disease. Electronically Signed   By: Kathreen Devoid M.D.   On: 07/19/2022 12:22    Procedures Procedures  {Document cardiac monitor, telemetry  assessment procedure when appropriate:1}  Medications Ordered in ED Medications  ondansetron (ZOFRAN-ODT) disintegrating tablet 4 mg (4 mg Oral Given 07/19/22 1315)    ED Course/ Medical Decision Making/ A&P                           Medical Decision Making Amount and/or Complexity of Data Reviewed Labs: ordered. Radiology: ordered.  Risk Prescription drug management.   61 yo F with a cc of n/v.  Had an episode of sob after an episode of vomiting.  CXR independently turbid by me without  focal infiltrate.  Patient's breathing improved significantly.  She does have some mild upper abdominal discomfort for me.  We will obtain LFTs lipase.  Oral trial.  {Document critical care time when appropriate:1} {Document review of labs and clinical decision tools ie heart score, Chads2Vasc2 etc:1}  {Document your independent review of radiology images, and any outside records:1} {Document your discussion with family members, caretakers, and with consultants:1} {Document social determinants of health affecting pt's care:1} {Document your decision making why or why not admission, treatments were needed:1} Final Clinical Impression(s) / ED Diagnoses Final diagnoses:  None    Rx / DC Orders ED Discharge Orders     None

## 2022-07-29 ENCOUNTER — Other Ambulatory Visit: Payer: Self-pay | Admitting: Internal Medicine

## 2022-08-02 ENCOUNTER — Encounter: Payer: Self-pay | Admitting: Gastroenterology

## 2022-08-03 ENCOUNTER — Other Ambulatory Visit: Payer: Self-pay | Admitting: Internal Medicine

## 2022-08-04 ENCOUNTER — Ambulatory Visit: Payer: BC Managed Care – PPO | Admitting: Internal Medicine

## 2022-08-04 NOTE — Progress Notes (Deleted)
Name: Teresa Grant  MRN/ DOB: [id number], May 10, 1961   Age/ Sex: 61 y.o., female    PCP: Glendale Chard, MD   Reason for Endocrinology Evaluation: Type 2 Diabetes Mellitus     Date of Initial Endocrinology Visit: 06/15/2021    PATIENT IDENTIFIER: Teresa Grant is a 61 y.o. female with a past medical history of T2DM, HTN and Dyslipidemia . The patient presented for initial endocrinology clinic visit on 06/15/2021 for consultative assistance with her diabetes management.    HPI: Teresa Grant was    Diagnosed with DM 2008 Prior Medications tried/Intolerance: Glipizide , januvia  Hemoglobin A1c has ranged from 6.8% in 2020, peaking at 11.2% in 2022.   On her initial visit to our clinic she had an A1c of 8.5%, we continued Xigduo, increase Ozempic, and decrease basal insulin  SUBJECTIVE:   During the last visit (01/20/2021): A1c was 6.2 %     Today (08/04/22): Teresa Grant is here for follow-up on diabetes management.  She checks her blood sugars occasionally . The patient has not had hypoglycemic episodes since the last clinic visit.   Patient presented to the ED on 07/19/2022 for shortness of breath Husband with prostate cancer     HOME DIABETES REGIMEN: Xigduo 01-999 mg twice daily- takes it once daily  Ozempic 1 mg weekly  Tresiba 26 units daily    Statin: yes ACE-I/ARB: Intolerant to Lisinopril due to cough     METER DOWNLOAD SUMMARY: Date range evaluated: 4/15-4/28/2023 Fingerstick Blood Glucose Tests = 4 Overall Mean FS Glucose =116 Standard Deviation = 184  BG Ranges: Low = 81 High = 470   Hypoglycemic Events/30 Days: BG < 50 = 0 Episodes of symptomatic severe hypoglycemia = 0   DIABETIC COMPLICATIONS: Microvascular complications:   Denies: CKD, retinopathy, neuropathy  Last eye exam: Completed 2021  Macrovascular complications:   Denies: CAD, PVD, CVA   PAST HISTORY: Past Medical History:  Past Medical History:  Diagnosis  Date   CAD in native artery 04/04/2022   Diabetes mellitus    Dysrhythmia    1985   Heart murmur    HLD (hyperlipidemia)    Hypertension    Kidney stone on right side 03/2013   Pneumonia    7 yrs ago   Past Surgical History:  Past Surgical History:  Procedure Laterality Date   ABDOMINAL HYSTERECTOMY  2001   BACK SURGERY     CERVICAL SPINE SURGERY  06/06/2019   Dr. Christella Noa, performed at surgical center   CESAREAN SECTION  1990   COLONOSCOPY     Dr. Deatra Ina normal   LEFT HEART CATH AND CORONARY ANGIOGRAPHY N/A 11/03/2021   Procedure: LEFT HEART CATH AND CORONARY ANGIOGRAPHY;  Surgeon: Nelva Bush, MD;  Location: [facility];  Service: Cardiovascular;  Laterality: N/A;   LUMBAR LAMINECTOMY/DECOMPRESSION MICRODISCECTOMY  03/20/2012   Procedure: LUMBAR LAMINECTOMY/DECOMPRESSION MICRODISCECTOMY;  Surgeon: Sinclair Ship, MD;  Location: [facility];  Service: Orthopedics;  Laterality: Left;  Left sided lumbar 4-5 microdisectomy   SPINE SURGERY  2013    Social History:  reports that she quit smoking about 18 years ago. Her smoking use included cigarettes. She has a 12.50 pack-year smoking history. She has never used smokeless tobacco. She reports current alcohol use. She reports that she does not use drugs. Family History:  Family History  Problem Relation Age of Onset   Hypertension Mother    Stroke Mother    Heart attack Mother 59   Heart disease  Mother    Hypertension Father    Kidney disease Father    Hypertension Sister    Hypertension Brother    Hypertension Maternal Aunt    Hypertension Maternal Uncle    Esophageal cancer Maternal Uncle    Diabetes Paternal Aunt    Diabetes Paternal Uncle    Colon cancer Cousin    Cancer Neg Hx    Colon polyps Neg Hx    Rectal cancer Neg Hx    Stomach cancer Neg Hx      HOME MEDICATIONS: Allergies as of 08/04/2022       Reactions   Lisinopril Cough   Atorvastatin Other (See Comments)   Glipizide Other (See Comments)    Hydrochlorothiazide Other (See Comments)   pancreatitis        Medication List        Accurate as of August 04, 2022 12:59 PM. If you have any questions, ask your nurse or doctor.          amLODipine 10 MG tablet Commonly known as: NORVASC Take 1 tablet (10 mg total) by mouth daily.   aspirin EC 81 MG tablet Take 1 tablet (81 mg total) by mouth daily. Swallow whole.   atorvastatin 20 MG tablet Commonly known as: LIPITOR TAKE 1 TABLET BY MOUTH DAILY   BC Fast Pain Relief 650-195-33.3 MG Pack Generic drug: Aspirin-Salicylamide-Caffeine Take 1 packet by mouth daily as needed (pain.).   famotidine 20 MG tablet Commonly known as: PEPCID Take 1 tablet (20 mg total) by mouth 2 (two) times daily.   gabapentin 100 MG capsule Commonly known as: NEURONTIN TAKE ONE CAPSULE BY MOUTH THREE TIMES A DAY What changed:  how to take this when to take this   glucose blood test strip Commonly known as: OneTouch Verio Use as directed to check blood sugars 2 times per day dx: e11.65   methocarbamol 500 MG tablet Commonly known as: ROBAXIN Take 1 tablet (500 mg total) by mouth 2 (two) times daily.   nitroGLYCERIN 0.4 MG SL tablet Commonly known as: NITROSTAT Place 1 tablet (0.4 mg total) under the tongue every 5 (five) minutes as needed for chest pain. UP TO 3 TABLETS   ondansetron 4 MG tablet Commonly known as: ZOFRAN Take 1 tablet (4 mg total) by mouth every 8 (eight) hours as needed for nausea or vomiting.   Ozempic (1 MG/DOSE) 4 MG/3ML Sopn Generic drug: Semaglutide (1 MG/DOSE) DIAL AND INJECT UNDER THE SKIN 1 MG WEEKLY   telmisartan 20 MG tablet Commonly known as: MICARDIS TAKE ONE TABLET BY MOUTH EVERY EVENING   Tresiba FlexTouch 200 UNIT/ML FlexTouch Pen Generic drug: insulin degludec Inject 26 Units into the skin at bedtime.   VITAMIN C PO Take 500 mg by mouth every evening.   Vitamin D (Ergocalciferol) 1.25 MG (50000 UNIT) Caps capsule Commonly known as:  DRISDOL TAKE ONE CAPSULE BY MOUTH ONCE WEEKLY   Xigduo XR 01-999 MG Tb24 Generic drug: Dapagliflozin Pro-metFORMIN ER Take 1 tablet by mouth in the morning and at bedtime.   ZINCATE PO Take 50 mg by mouth every evening.         ALLERGIES: Allergies  Allergen Reactions   Lisinopril Cough   Atorvastatin Other (See Comments)   Glipizide Other (See Comments)   Hydrochlorothiazide Other (See Comments)    pancreatitis     REVIEW OF SYSTEMS: A comprehensive ROS was conducted with the patient and is negative except as per HPI     OBJECTIVE:  VITAL SIGNS:There were no vitals taken for this visit.   PHYSICAL EXAM:  General: Pt appears well and is in NAD  Neck: General: Supple without adenopathy or carotid bruits. Thyroid: Thyroid size normal.  No goiter or nodules appreciated.  Lungs: Clear with good BS bilat with no rales, rhonchi, or wheezes  Heart: RRR with normal S1 and S2 and no gallops; no murmurs; no rub  Abdomen: Normoactive bowel sounds, soft, nontender, without masses or organomegaly palpable  Extremities:  Lower extremities - No pretibial edema. No lesions.  Neuro: MS is good with appropriate affect, pt is alert and Ox3    DM foot exam: 01/20/2022  The skin of the feet is intact without sores or ulcerations. The pedal pulses are 2+ on right and 2+ on left. The sensation is decreased  to a screening 5.07, 10 gram monofilament bilaterally   DATA REVIEWED:  Lab Results  Component Value Date   HGBA1C 6.2 (H) 12/12/2021   HGBA1C 7.8 (H) 09/05/2021   HGBA1C 8.5 (H) 05/12/2021   Lab Results  Component Value Date   MICROALBUR 150 12/02/2020   LDLCALC 65 11/01/2021   CREATININE 1.28 (H) 07/19/2022   Lab Results  Component Value Date   MICRALBCREAT 201 (H) 12/12/2021    Lab Results  Component Value Date   CHOL 122 11/01/2021   HDL 36 (L) 11/01/2021   LDLCALC 65 11/01/2021   TRIG 116 11/01/2021   CHOLHDL 3.4 11/01/2021        ASSESSMENT / PLAN /  RECOMMENDATIONS:   1) Type 2 Diabetes Mellitus, optimally controlled, With Neuropathic complications - Most recent A1c of 6.2 %. Goal A1c < 7.0 %.     - Praised pt on the weight loss and improved glycemic control  - She has been taking 1 tablet of the Xigduo at night, instead of 2 tablets in the morning, due to nausea.  I have recommended switching Xigduo to separate [place] tablet and metformin but she has so many bottles and would like to finish them.  She was asked to try taking 2 tablets at night and if she does develop nausea to reduce it back down to 1 tablet a day until her next visit and we will make the switch. -On her meter download today she had a BG reading > 400 mg/DL but this is her work mates glucose reading -We will reduce basal insulin as below  MEDICATIONS: Continue Xigduo 01-999 mg 2 tabs at night Continue Ozempic 1 mg weekly  Decrease Tresiba to 20 units daily   EDUCATION / INSTRUCTIONS: BG monitoring instructions: Patient is instructed to check her blood sugars 1 times a day, fasting . Call [facility] if: BG persistently < 70  I reviewed the Rule of 15 for the treatment of hypoglycemia in detail with the patient. Literature supplied.   2) Diabetic complications:  Eye: Does not have known diabetic retinopathy.  Neuro/ Feet: Does  have known diabetic peripheral neuropathy based on exam 9/21/202 Renal: Patient does not have known baseline CKD. She is  on an ACEI/ARB at present.  3) Dyslipidemia:    - LDL was above goal at 100 mg/dL in 2021, We switched pravastatin to  Atorvastatin  -LDL at goal -No changes  Medication   Continue  Atorvastatin 20 mg daily     F/U in 6 months    Signed electronically by: Mack Guise, MD  [facility]  [address],  [place] [address] Phone: [phone] FAX: [phone]   CC: Glendale Chard, [address] [place] [address] Phone: [phone]  Fax: [phone]    Return to Endocrinology clinic as below: Future Appointments  Date Time Provider [place]  08/04/2022  2:20 PM Kenlynn Houde, Melanie Crazier, MD [facility] None  12/18/2022  2:00 PM Glendale Chard, MD [facility] None

## 2022-08-11 ENCOUNTER — Emergency Department (HOSPITAL_BASED_OUTPATIENT_CLINIC_OR_DEPARTMENT_OTHER)
Admission: EM | Admit: 2022-08-11 | Discharge: 2022-08-11 | Disposition: A | Discharge status: Home / Self Care | Payer: BC Managed Care – PPO | Attending: Emergency Medicine | Admitting: Emergency Medicine

## 2022-08-11 ENCOUNTER — Encounter (HOSPITAL_BASED_OUTPATIENT_CLINIC_OR_DEPARTMENT_OTHER): Payer: Self-pay | Admitting: Emergency Medicine

## 2022-08-11 ENCOUNTER — Other Ambulatory Visit: Payer: Self-pay

## 2022-08-11 DIAGNOSIS — Z7982 Long term (current) use of aspirin: Secondary | ICD-10-CM | POA: Diagnosis not present

## 2022-08-11 DIAGNOSIS — U071 COVID-19: Secondary | ICD-10-CM

## 2022-08-11 DIAGNOSIS — Z79899 Other long term (current) drug therapy: Secondary | ICD-10-CM | POA: Insufficient documentation

## 2022-08-11 DIAGNOSIS — R0981 Nasal congestion: Secondary | ICD-10-CM | POA: Diagnosis present

## 2022-08-11 LAB — RESP PANEL BY RT-PCR (FLU A&B, COVID) ARPGX2
Influenza A by PCR: NEGATIVE
Influenza B by PCR: NEGATIVE
SARS Coronavirus 2 by RT PCR: POSITIVE — AB

## 2022-08-11 MED ORDER — ACETAMINOPHEN 325 MG PO TABS
ORAL_TABLET | ORAL | Status: AC
  Filled 2022-08-11: qty 1

## 2022-08-11 MED ORDER — ACETAMINOPHEN 325 MG PO TABS
650.0000 mg | ORAL_TABLET | Freq: Once | ORAL | Status: AC
  Administered 2022-08-11: 650 mg via ORAL
  Filled 2022-08-11: qty 2

## 2022-08-11 NOTE — ED Triage Notes (Addendum)
Patient reports nasal congestion, runny nose, hoarseness, and headache since Monday. Patient reports positive COVID test at home.

## 2022-08-11 NOTE — Discharge Instructions (Signed)
It was a pleasure taking care of you!    Your COVID swab was positive for COVID tonight.  Your flu swab was negative.  According to the CDC, you must self quarantine for 5 days from the start of your symptoms.  Your quarantine period ends on 08/11/2022. You may take over-the-counter cough and cold medications as needed for your symptoms. Ensure to maintain fluid intake with tea, soup, broth, Pedialyte, Gatorade, water. You may follow-up with your primary care provider as needed.  Return to the Emergency Department if you are experiencing trouble breathing, worsening or increasing chest pain, decreased fluid intake or worsening symptoms.

## 2022-08-11 NOTE — ED Provider Notes (Signed)
[facility]ENC[facility] DEPART[facility] Provider Note   CSN: [id number] Arrival date & time: 08/11/22  2057     History  Chief Complaint  Patient presents with   Nasal Congestion    Teresa Grant is a 61 y.o. female who presents to the ED complaining of nasal congestion onset 5 days.  Denies sick contacts.  Has associated rhinorrhea, headache. Tried tylenol, robitussin, nyquil for her symptoms.  Denies trouble swallowing, painful swallowing, shortness of breath, chest pain.   The history is provided by the patient. No language interpreter was used.       Home Medications Prior to Admission medications   Medication Sig Start Date End Date Taking? Authorizing Provider  amLODipine (NORVASC) 10 MG tablet Take 1 tablet (10 mg total) by mouth daily. 05/10/22   Minette Brine, FNP  Ascorbic Acid (VITAMIN C PO) Take 500 mg by mouth every evening.    [provider]  aspirin EC 81 MG tablet Take 1 tablet (81 mg total) by mouth daily. Swallow whole. 12/05/21   Loel Dubonnet, NP  Aspirin-Salicylamide-Caffeine (BC FAST PAIN RELIEF) [phone] MG PACK Take 1 packet by mouth daily as needed (pain.).    [provider]  atorvastatin (LIPITOR) 20 MG tablet TAKE 1 TABLET BY MOUTH DAILY 08/03/22   Shamleffer, Melanie Crazier, MD  Dapagliflozin-metFORMIN HCl ER (XIGDUO XR) 01-999 MG TB24 Take 1 tablet by mouth in the morning and at bedtime. 11/06/21   End, Harrell Gave, MD  famotidine (PEPCID) 20 MG tablet Take 1 tablet (20 mg total) by mouth 2 (two) times daily. 02/14/22   Glendale Chard, MD  gabapentin (NEURONTIN) 100 MG capsule TAKE ONE CAPSULE BY MOUTH THREE TIMES A DAY Patient taking differently: 100 mg 2 (two) times daily. 08/15/21   Glendale Chard, MD  glucose blood [facility] VERIO) test strip Use as directed to check blood sugars 2 times per day dx: e11.65 09/13/18   Glendale Chard, MD  insulin degludec (TRESIBA FLEXTOUCH) 200 UNIT/ML FlexTouch Pen Inject 26 Units  into the skin at bedtime. 07/31/22   Shamleffer, Melanie Crazier, MD  methocarbamol (ROBAXIN) 500 MG tablet Take 1 tablet (500 mg total) by mouth 2 (two) times daily. 01/24/22   Alroy Bailiff, Margaux, PA-C  nitroGLYCERIN (NITROSTAT) 0.4 MG SL tablet Place 1 tablet (0.4 mg total) under the tongue every 5 (five) minutes as needed for chest pain. UP TO 3 TABLETS 10/31/21 04/04/22  Skeet Latch, MD  ondansetron (ZOFRAN) 4 MG tablet Take 1 tablet (4 mg total) by mouth every 8 (eight) hours as needed for nausea or vomiting. 07/19/22   Hayden Rasmussen, MD  OZEMPIC, 1 MG/DOSE, 4 MG/3ML SOPN DIAL AND INJECT UNDER THE SKIN 1 MG WEEKLY 06/23/22   Shamleffer, Melanie Crazier, MD  telmisartan (MICARDIS) 20 MG tablet TAKE ONE TABLET BY MOUTH EVERY EVENING 08/03/22   Glendale Chard, MD  Vitamin D, Ergocalciferol, (DRISDOL) 1.25 MG (50000 UNIT) CAPS capsule TAKE ONE CAPSULE BY MOUTH ONCE WEEKLY 07/31/22   Glendale Chard, MD  Zinc Sulfate (ZINCATE PO) Take 50 mg by mouth every evening.    [provider]      Allergies    Lisinopril, Atorvastatin, Glipizide, and Hydrochlorothiazide    Review of Systems   Review of Systems  All other systems reviewed and are negative.   Physical Exam Updated Vital Signs BP 128/81 (BP Location: Left Arm)   Pulse 85   Temp 99.3 F (37.4 C) (Oral)   Resp 18   Ht '5\' 1"'$  (  1.549 m)   Wt 70.8 kg   SpO2 98%   BMI 29.48 kg/m  Physical Exam Vitals and nursing note reviewed.  Constitutional:      General: She is not in acute distress.    Appearance: She is not diaphoretic.  HENT:     Head: Normocephalic and atraumatic.     Mouth/Throat:     Pharynx: No oropharyngeal exudate.  Eyes:     General: No scleral icterus.    Conjunctiva/sclera: Conjunctivae normal.  Cardiovascular:     Rate and Rhythm: Normal rate and regular rhythm.     Pulses: Normal pulses.     Heart sounds: Normal heart sounds.  Pulmonary:     Effort: Pulmonary effort is normal. No respiratory  distress.     Breath sounds: Normal breath sounds. No wheezing.  Abdominal:     General: Bowel sounds are normal.     Palpations: Abdomen is soft. There is no mass.     Tenderness: There is no abdominal tenderness. There is no guarding or rebound.  Musculoskeletal:        General: Normal range of motion.     Cervical back: Normal range of motion and neck supple.  Skin:    General: Skin is warm and dry.  Neurological:     Mental Status: She is alert.  Psychiatric:        Behavior: Behavior normal.     ED Results / Procedures / Treatments   Labs (all labs ordered are listed, but only abnormal results are displayed) Labs Reviewed  RESP PANEL BY RT-PCR (FLU A&B, COVID) ARPGX2 - Abnormal; Notable for the following components:      Result Value   SARS Coronavirus 2 by RT PCR POSITIVE (*)    All other components within normal limits    EKG None  Radiology No results found.  Procedures Procedures    Medications Ordered in ED Medications  acetaminophen (TYLENOL) 325 MG tablet (has no administration in time range)  acetaminophen (TYLENOL) tablet 650 mg (650 mg Oral Given 08/11/22 2323)    ED Course/ Medical Decision Making/ A&P                           Medical Decision Making Risk OTC drugs.   Pt presents with concerns for nasal congestion, rhinorrhea, headache onset 5 days.  Denies sick contacts.  Took COVID test at home that was positive.  Patient afebrile.  On exam patient without acute cardiovascular, respiratory exam findings.  Differential diagnosis includes COVID, flu, pneumonia, viral URI with cough.    Labs:  I ordered, and personally interpreted labs.  The pertinent results include:   COVID swab positive Flu swab negative   Medications:  I ordered medication including tylenol for symptom management  Reevaluation of the patient after these medicines and interventions, I reevaluated the patient and found that they have improved I have reviewed the  patients home medicines and have made adjustments as needed   Disposition: Pt presentation suspicious for COVID-19. Doubt flu, PNA, or viral URI with cough at this time. After consideration of the diagnostic results and the patients response to treatment, I feel that the patient would benefit from Discharge home.  Patient out of the window at this time to start antivirals. Thorough discussion regarding quarantine/isolation period as per CDC guidelines. Work note provided.  Discussed with patient to follow-up with primary care provider as needed regarding today's ED visit supportive care measures  and strict return precautions discussed with patient at bedside. Pt acknowledges and verbalizes understanding. Pt appears safe for discharge. Follow up as indicated in discharge paperwork.    This chart was dictated using voice recognition software, Dragon. Despite the best efforts of this provider to proofread and correct errors, errors may still occur which can change documentation meaning.   Final Clinical Impression(s) / ED Diagnoses Final diagnoses:  [id number]    Rx / DC Orders ED Discharge Orders     None         Carlon Davidson A, PA-C 08/12/22 0004    Ezequiel Essex, MD 08/12/22 0040

## 2022-08-15 ENCOUNTER — Ambulatory Visit (INDEPENDENT_AMBULATORY_CARE_PROVIDER_SITE_OTHER): Payer: BC Managed Care – PPO | Admitting: Internal Medicine

## 2022-08-15 ENCOUNTER — Encounter: Payer: Self-pay | Admitting: Internal Medicine

## 2022-08-15 VITALS — BP 116/70 | HR 82 | Ht 61.0 in | Wt 158.0 lb

## 2022-08-15 DIAGNOSIS — E785 Hyperlipidemia, unspecified: Secondary | ICD-10-CM

## 2022-08-15 DIAGNOSIS — Z794 Long term (current) use of insulin: Secondary | ICD-10-CM | POA: Diagnosis not present

## 2022-08-15 DIAGNOSIS — E1142 Type 2 diabetes mellitus with diabetic polyneuropathy: Secondary | ICD-10-CM

## 2022-08-15 LAB — POCT GLUCOSE (DEVICE FOR HOME USE): Glucose Fasting, POC: 96 mg/dL (ref 70–99)

## 2022-08-15 LAB — POCT GLYCOSYLATED HEMOGLOBIN (HGB A1C): Hemoglobin A1C: 6.4 % — AB (ref 4.0–5.6)

## 2022-08-15 MED ORDER — TRESIBA FLEXTOUCH 200 UNIT/ML ~~LOC~~ SOPN: 22.0000 [IU] | PEN_INJECTOR | Freq: Every day | SUBCUTANEOUS | 11 refills | Status: DC

## 2022-08-15 MED ORDER — ATORVASTATIN CALCIUM 20 MG PO TABS: 20.0000 mg | ORAL_TABLET | Freq: Every day | ORAL | 3 refills | Status: DC

## 2022-08-15 MED ORDER — XIGDUO XR 5-1000 MG PO TB24
2.0000 | ORAL_TABLET | Freq: Every day | ORAL | 3 refills | Status: DC
Start: 2022-08-15 — End: 2023-01-15

## 2022-08-15 MED ORDER — INSULIN PEN NEEDLE 32G X 4 MM MISC: 1.0000 | Freq: Every day | 3 refills | Status: DC

## 2022-08-15 NOTE — Patient Instructions (Addendum)
STOP Ozempic  Continue Xigduo 01-999 mg, 2 tablets at night   Increase Tresiba  22 units daily    HOW TO TREAT LOW BLOOD SUGARS (Blood sugar LESS THAN 70 MG/DL) Please follow the RULE OF 15 for the treatment of hypoglycemia treatment (when your (blood sugars are less than 70 mg/dL)   STEP 1: Take 15 grams of carbohydrates when your blood sugar is low, which includes:  3-4 GLUCOSE TABS  OR 3-4 OZ OF JUICE OR REGULAR SODA OR ONE TUBE OF GLUCOSE GEL    STEP 2: RECHECK blood sugar in 15 MINUTES STEP 3: If your blood sugar is still low at the 15 minute recheck --> then, go back to STEP 1 and treat AGAIN with another 15 grams of carbohydrates.

## 2022-08-15 NOTE — Progress Notes (Signed)
Name: Teresa Grant  MRN/ DOB: [id number], Oct 03, 1960   Age/ Sex: 61 y.o., female    PCP: Glendale Chard, MD   Reason for Endocrinology Evaluation: Type 2 Diabetes Mellitus     Date of Initial Endocrinology Visit: 06/15/2021    PATIENT IDENTIFIER: Teresa Grant is a 61 y.o. female with a past medical history of T2DM, HTN and Dyslipidemia . The patient presented for initial endocrinology clinic visit on 06/15/2021 for consultative assistance with her diabetes management.    HPI: Teresa Grant was    Diagnosed with DM 2008 Prior Medications tried/Intolerance: Glipizide , januvia  Hemoglobin A1c has ranged from 6.8% in 2020, peaking at 11.2% in 2022.   On her initial visit to our clinic she had an A1c of 8.5%, we continued Xigduo, increase Ozempic, and decrease basal insulin   Stop Ozempic 07/2022 due to hx of pancreatitis > 20 yrs ago , presenting to ED 06/2022 with N/V and slight elevation in lipase  SUBJECTIVE:   During the last visit (01/20/2021): A1c was 6.2 %     Today (08/15/22): Teresa Grant is here for follow-up on diabetes management.  She checks her blood sugars occasionally . The patient has not had hypoglycemic episodes since the last clinic visit.   Patient presented to the ED on 07/19/2022 for shortness of breath, vomiting or diarrhea Today she tells me she has hx of pancreatitis > 20 yrs ago. Her lipase was slightly elevated 06/2022   She continues with  diarrhea and nausea  Currently having URI , tested Positive for COVID last week.   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 Christella Noa, performed at surgical center   CESAREAN SECTION  1990   COLONOSCOPY     Dr. Deatra Ina normal   LEFT HEART CATH AND CORONARY ANGIOGRAPHY N/A 11/03/2021   Procedure: LEFT HEART CATH AND CORONARY ANGIOGRAPHY;  Surgeon: Nelva Bush, MD;  Location: [facility];  Service: Cardiovascular;  Laterality: N/A;   LUMBAR LAMINECTOMY/DECOMPRESSION MICRODISCECTOMY  03/20/2012   Procedure: LUMBAR LAMINECTOMY/DECOMPRESSION MICRODISCECTOMY;  Surgeon: Sinclair Ship, MD;  Location: [facility];  Service: Orthopedics;  Laterality: Left;  Left sided lumbar 4-5 microdisectomy   SPINE SURGERY  2013    Social History:  reports that she quit smoking about 18 years ago. Her smoking use included cigarettes. She has a 12.50 pack-year smoking history. She has never used smokeless tobacco. She reports current alcohol use. She reports that she does not use drugs. Family History:  Family  History  Problem Relation Age of Onset   Hypertension Mother    Stroke Mother    Heart attack Mother 56   Heart disease Mother    Hypertension Father    Kidney disease Father    Hypertension Sister    Hypertension Brother    Hypertension Maternal Aunt    Hypertension Maternal Uncle    Esophageal cancer Maternal Uncle    Diabetes Paternal Aunt    Diabetes Paternal Uncle    Colon cancer Cousin    Cancer Neg Hx    Colon polyps Neg Hx    Rectal cancer Neg Hx    Stomach cancer Neg Hx      HOME MEDICATIONS: Allergies as of  08/15/2022       Reactions   Lisinopril Cough   Atorvastatin Other (See Comments)   Glipizide Other (See Comments)   Hydrochlorothiazide Other (See Comments)   pancreatitis        Medication List        Accurate as of August 15, 2022  9:11 AM. If you have any questions, ask your nurse or doctor.          STOP taking these medications    methocarbamol 500 MG tablet Commonly known as: ROBAXIN Stopped by: Dorita Sciara, MD       TAKE these medications    amLODipine 10 MG tablet Commonly known as: NORVASC Take 1 tablet (10 mg total) by mouth daily.   aspirin EC 81 MG tablet Take 1 tablet (81 mg total) by mouth daily. Swallow whole.   atorvastatin 20 MG tablet Commonly known as: LIPITOR TAKE 1 TABLET BY MOUTH DAILY   BC Fast Pain Relief 650-195-33.3 MG Pack Generic drug: Aspirin-Salicylamide-Caffeine Take 1 packet by mouth daily as needed (pain.).   famotidine 20 MG tablet Commonly known as: PEPCID Take 1 tablet (20 mg total) by mouth 2 (two) times daily.   gabapentin 100 MG capsule Commonly known as: NEURONTIN TAKE ONE CAPSULE BY MOUTH THREE TIMES A DAY What changed:  how to take this when to take this   glucose blood test strip Commonly known as: OneTouch Verio Use as directed to check blood sugars 2 times per day dx: e11.65   Linzess 290 MCG Caps capsule Generic drug: linaclotide Take 290 mcg by mouth daily.   nitroGLYCERIN 0.4 MG SL tablet Commonly known as: NITROSTAT Place 1 tablet (0.4 mg total) under the tongue every 5 (five) minutes as needed for chest pain. UP TO 3 TABLETS   ondansetron 4 MG tablet Commonly known as: ZOFRAN Take 1 tablet (4 mg total) by mouth every 8 (eight) hours as needed for nausea or vomiting.   Ozempic (1 MG/DOSE) 4 MG/3ML Sopn Generic drug: Semaglutide (1 MG/DOSE) DIAL AND INJECT UNDER THE SKIN 1 MG WEEKLY   telmisartan 20 MG tablet Commonly known as: MICARDIS TAKE ONE TABLET BY MOUTH EVERY EVENING    Tresiba FlexTouch 200 UNIT/ML FlexTouch Pen Generic drug: insulin degludec Inject 26 Units into the skin at bedtime. What changed: how much to take   VITAMIN C PO Take 500 mg by mouth every evening.   Vitamin D (Ergocalciferol) 1.25 MG (50000 UNIT) Caps capsule Commonly known as: DRISDOL TAKE ONE CAPSULE BY MOUTH ONCE WEEKLY   Xigduo XR 01-999 MG Tb24 Generic drug: Dapagliflozin Pro-metFORMIN ER Take 1 tablet by mouth in the morning and at bedtime.   ZINCATE PO Take 50 mg by mouth every evening.  ALLERGIES: Allergies  Allergen Reactions   Lisinopril Cough   Atorvastatin Other (See Comments)   Glipizide Other (See Comments)   Hydrochlorothiazide Other (See Comments)    pancreatitis     REVIEW OF SYSTEMS: A comprehensive ROS was conducted with the patient and is negative except as per HPI     OBJECTIVE:   VITAL SIGNS:BP 116/70 (BP Location: Left Arm, Patient Position: Sitting, Cuff Size: Large)   Pulse 82   Ht '5\' 1"'$  (1.549 m)   Wt 158 lb (71.7 kg)   SpO2 96%   BMI 29.85 kg/m    PHYSICAL EXAM:  General: Pt appears well and is in NAD  Lungs: Clear with good BS bilat with no rales, rhonchi, or wheezes  Heart: RRR   Abdomen: soft, nontender, without masses or organomegaly palpable  Extremities:  Lower extremities - No pretibial edema. No lesions.  Neuro: MS is good with appropriate affect, pt is alert and Ox3    DM foot exam: 01/20/2022  The skin of the feet is intact without sores or ulcerations. The pedal pulses are 2+ on right and 2+ on left. The sensation is decreased  to a screening 5.07, 10 gram monofilament bilaterally   DATA REVIEWED:  Lab Results  Component Value Date   HGBA1C 6.4 (A) 08/15/2022   HGBA1C 6.2 (H) 12/12/2021   HGBA1C 7.8 (H) 09/05/2021    Latest Reference Range & Units 07/19/22 13:00  COMPREHENSIVE METABOLIC PANEL  Rpt !  Sodium 135 - 145 mmol/L 139  Potassium 3.5 - 5.1 mmol/L 4.5  Chloride 98 - 111 mmol/L 104   CO2 22 - 32 mmol/L 22  Glucose 70 - 99 mg/dL 100 (H)  BUN 8 - 23 mg/dL 18  Creatinine 0.44 - 1.00 mg/dL 1.28 (H)  Calcium 8.9 - 10.3 mg/dL 9.9  Anion gap 5 - 15  13  Alkaline Phosphatase 38 - 126 U/L 127 (H)  Albumin 3.5 - 5.0 g/dL 4.5  Lipase 11 - 51 U/L 64 (H)  AST 15 - 41 U/L 22  ALT 0 - 44 U/L 11  Total Protein 6.5 - 8.1 g/dL 7.9  Total Bilirubin 0.3 - 1.2 mg/dL 0.5  GFR, Estimated >60 mL/min 48 (L)      ASSESSMENT / PLAN / RECOMMENDATIONS:   1) Type 2 Diabetes Mellitus, optimally controlled, With Neuropathic complications - Most recent A1c of 6.4 %. Goal A1c < 7.0 %.    - Pt states she had pancreatitis > 20 yrs ago but given recent albeit slight elevation in lipase and N/V , I have recommended discontinuing Ozempic  due to risk of pancreatitis  - Will increase Insulin as below   MEDICATIONS:   STOP Ozempic  Continue Xigduo 01-999 mg 2 tabs at night Increase  Tresiba to 22 units daily   EDUCATION / INSTRUCTIONS: BG monitoring instructions: Patient is instructed to check her blood sugars 1 times a day, fasting . Call [facility] if: BG persistently < 70  I reviewed the Rule of 15 for the treatment of hypoglycemia in detail with the patient. Literature supplied.   2) Diabetic complications:  Eye: Does not have known diabetic retinopathy.  Neuro/ Feet: Does  have known diabetic peripheral neuropathy based on exam 9/21/202 Renal: Patient does not have known baseline CKD. She is  on an ACEI/ARB at present.  3) Dyslipidemia:    - LDL was above goal at 100 mg/dL in 2021, We switched pravastatin to  Atorvastatin  -LDL at goal -No  changes  Medication   Continue  Atorvastatin 20 mg daily     F/U in 6 months    Signed electronically by: Mack Guise, MD  [facility]  [facility] [address] [place], [place] [address] Phone: [phone] FAX: [phone]   CC: Glendale Chard, [address] [place] [address] Phone: [phone]  Fax: [phone]    Return to Endocrinology clinic as below: Future Appointments  Date Time Provider [place]  12/18/2022  2:00 PM Glendale Chard, MD [facility] None

## 2022-09-29 ENCOUNTER — Ambulatory Visit: Payer: BC Managed Care – PPO | Admitting: Internal Medicine

## 2022-11-20 ENCOUNTER — Other Ambulatory Visit: Payer: Self-pay

## 2022-11-20 MED ORDER — AMLODIPINE BESYLATE 10 MG PO TABS: 10.0000 mg | ORAL_TABLET | Freq: Every day | ORAL | 0 refills | Status: DC

## 2022-11-22 LAB — HM COLONOSCOPY

## 2022-12-18 ENCOUNTER — Encounter: Payer: Self-pay | Admitting: Internal Medicine

## 2022-12-18 ENCOUNTER — Other Ambulatory Visit: Payer: Self-pay | Admitting: Internal Medicine

## 2022-12-18 ENCOUNTER — Ambulatory Visit (INDEPENDENT_AMBULATORY_CARE_PROVIDER_SITE_OTHER): Payer: BC Managed Care – PPO | Admitting: Internal Medicine

## 2022-12-18 VITALS — BP 120/80 | HR 85 | Temp 98.1°F | Ht 61.0 in | Wt 163.4 lb

## 2022-12-18 DIAGNOSIS — B3731 Acute candidiasis of vulva and vagina: Secondary | ICD-10-CM

## 2022-12-18 DIAGNOSIS — Z1231 Encounter for screening mammogram for malignant neoplasm of breast: Secondary | ICD-10-CM

## 2022-12-18 DIAGNOSIS — E1142 Type 2 diabetes mellitus with diabetic polyneuropathy: Secondary | ICD-10-CM

## 2022-12-18 DIAGNOSIS — Z0000 Encounter for general adult medical examination without abnormal findings: Secondary | ICD-10-CM

## 2022-12-18 DIAGNOSIS — I1310 Hypertensive heart and chronic kidney disease without heart failure, with stage 1 through stage 4 chronic kidney disease, or unspecified chronic kidney disease: Secondary | ICD-10-CM | POA: Diagnosis not present

## 2022-12-18 DIAGNOSIS — N1831 Chronic kidney disease, stage 3a: Secondary | ICD-10-CM | POA: Insufficient documentation

## 2022-12-18 DIAGNOSIS — M79671 Pain in right foot: Secondary | ICD-10-CM

## 2022-12-18 DIAGNOSIS — E782 Mixed hyperlipidemia: Secondary | ICD-10-CM

## 2022-12-18 DIAGNOSIS — Z01411 Encounter for gynecological examination (general) (routine) with abnormal findings: Secondary | ICD-10-CM

## 2022-12-18 DIAGNOSIS — I700 Atherosclerosis of aorta: Secondary | ICD-10-CM

## 2022-12-18 DIAGNOSIS — Z23 Encounter for immunization: Secondary | ICD-10-CM

## 2022-12-18 DIAGNOSIS — I25118 Atherosclerotic heart disease of native coronary artery with other forms of angina pectoris: Secondary | ICD-10-CM | POA: Diagnosis not present

## 2022-12-18 DIAGNOSIS — Z794 Long term (current) use of insulin: Secondary | ICD-10-CM | POA: Diagnosis not present

## 2022-12-18 LAB — POC HEMOCCULT BLD/STL (OFFICE/1-CARD/DIAGNOSTIC): Fecal Occult Blood, POC: NEGATIVE

## 2022-12-18 LAB — POCT URINALYSIS DIPSTICK
Bilirubin, UA: NEGATIVE
Glucose, UA: POSITIVE — AB
Ketones, UA: NEGATIVE
Leukocytes, UA: NEGATIVE
Nitrite, UA: NEGATIVE
Protein, UA: POSITIVE — AB
Spec Grav, UA: 1.015 (ref 1.010–1.025)
Urobilinogen, UA: 0.2 E.U./dL
pH, UA: 6 (ref 5.0–8.0)

## 2022-12-18 MED ORDER — FLUCONAZOLE 100 MG PO TABS: ORAL_TABLET | ORAL | 0 refills | Status: DC

## 2022-12-18 NOTE — Patient Instructions (Signed)

## 2022-12-18 NOTE — Progress Notes (Unsigned)
Barnet Glasgow Martin,acting as a Education administrator for [name], MD.,have documented all relevant documentation on the behalf of [name], MD,as directed by  [name], MD while in the presence of [name], MD.   Subjective:     Patient ID: Teresa Grant , female    DOB: 07/21/1961 , 62 y.o.   MRN: [id number]   Chief Complaint  Patient presents with   Annual Exam   Diabetes   Hypertension    HPI  She is here today for a full physical exam. She is no longer seeing a GYN but will call her old GYN for PAP. She reports compliance with meds. She denies having any headaches, chest pain and shortness of breath. Patient states she is having a lot of aches and pains everywhere. She states the bottom of her foot hurts. Patient reports her sugar has gone up because her other dr took her off zempic.  BP Readings from Last 3 Encounters: 12/18/22 : 120/80 08/15/22 : 116/70 08/11/22 : 128/81    Diabetes She presents for her follow-up diabetic visit. She has type 2 diabetes mellitus. Her disease course has been stable. There are no hypoglycemic associated symptoms. Pertinent negatives for hypoglycemia include no dizziness or headaches. Pertinent negatives for diabetes include no blurred vision, no chest pain, no fatigue, no polydipsia, no polyphagia and no polyuria. There are no hypoglycemic complications. Risk factors for coronary artery disease include diabetes mellitus, dyslipidemia, hypertension, obesity, post-menopausal, sedentary lifestyle and stress. Her breakfast blood glucose is taken between 8-9 am. Her breakfast blood glucose range is generally 130-140 mg/dl. An ACE inhibitor/angiotensin II receptor blocker is being taken.  Hypertension This is a chronic problem. The current episode started more than 1 year ago. The problem has been gradually improving since onset. The problem is controlled. Pertinent negatives include no blurred vision, chest pain or headaches.     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   Diabetes mellitus    Dysrhythmia    1985   Heart murmur    HLD (hyperlipidemia)    Hypertension    Kidney stone on right side 03/2013   Pneumonia    7 yrs ago     Family History  Problem Relation Age of Onset   Hypertension Mother    Stroke Mother    Heart attack Mother 71   Heart disease Mother    Hypertension Father    Kidney disease Father    Hypertension Sister    Hypertension Brother    Hypertension Maternal Aunt    Hypertension Maternal Uncle    Esophageal cancer Maternal Uncle    Diabetes Paternal Aunt    Diabetes Paternal Uncle    Colon cancer Cousin    Cancer Neg Hx    Colon polyps Neg Hx    Rectal cancer Neg Hx    Stomach cancer Neg Hx      Current Outpatient Medications:    amLODipine (NORVASC) 10 MG tablet, Take 1 tablet (10 mg total) by mouth daily., Disp: 90 tablet, Rfl: 0   Ascorbic Acid (VITAMIN C PO), Take 500 mg by mouth every evening., Disp: , Rfl:    aspirin EC 81 MG tablet, Take 1 tablet (81 mg total) by mouth daily. Swallow whole., Disp: 90 tablet, Rfl: 3   Aspirin-Salicylamide-Caffeine (BC FAST PAIN RELIEF) 650-195-33.3 MG PACK, Take 1 packet by mouth daily as needed (pain.)., Disp: , Rfl:    atorvastatin (  LIPITOR) 20 MG tablet, Take 1 tablet (20 mg total) by mouth daily., Disp: 90 tablet, Rfl: 3   Dapagliflozin Pro-metFORMIN ER (XIGDUO XR) 01-999 MG TB24, Take 2 tablets by mouth daily in the afternoon., Disp: 180 tablet, Rfl: 3   famotidine (PEPCID) 20 MG tablet, Take 1 tablet (20 mg total) by mouth 2 (two) times daily., Disp: 180 tablet, Rfl: 1   fluconazole (DIFLUCAN) 100 MG tablet, Take one tablet po today, repeat in 48 hours., Disp: 2 tablet, Rfl: 0   gabapentin (NEURONTIN) 100 MG capsule, TAKE ONE CAPSULE BY MOUTH THREE TIMES A DAY (Patient taking differently: 100 mg 2 (two) times daily.), Disp: 270 capsule, Rfl: 1   glucose blood (ONETOUCH VERIO) test strip, Use as directed  to check blood sugars 2 times per day dx: e11.65, Disp: 100 each, Rfl: 10   insulin degludec (TRESIBA FLEXTOUCH) 200 UNIT/ML FlexTouch Pen, Inject 22 Units into the skin at bedtime., Disp: 15 mL, Rfl: 11   Insulin Pen Needle 32G X 4 MM MISC, 1 Device by Does not apply route daily in the afternoon., Disp: 100 each, Rfl: 3   LINZESS 290 MCG CAPS capsule, Take 290 mcg by mouth daily., Disp: , Rfl:    ondansetron (ZOFRAN) 4 MG tablet, Take 1 tablet (4 mg total) by mouth every 8 (eight) hours as needed for nausea or vomiting., Disp: 15 tablet, Rfl: 0   telmisartan (MICARDIS) 20 MG tablet, TAKE ONE TABLET BY MOUTH EVERY EVENING, Disp: 90 tablet, Rfl: 1   Vitamin D, Ergocalciferol, (DRISDOL) 1.25 MG (50000 UNIT) CAPS capsule, TAKE ONE CAPSULE BY MOUTH ONCE WEEKLY, Disp: 12 capsule, Rfl: 1   Zinc Sulfate (ZINCATE PO), Take 50 mg by mouth every evening., Disp: , Rfl:    nitroGLYCERIN (NITROSTAT) 0.4 MG SL tablet, Place 1 tablet (0.4 mg total) under the tongue every 5 (five) minutes as needed for chest pain. UP TO 3 TABLETS, Disp: 25 tablet, Rfl: PRN   Allergies  Allergen Reactions   Lisinopril Cough   Atorvastatin Other (See Comments)   Glipizide Other (See Comments)   Hydrochlorothiazide Other (See Comments)    pancreatitis      The patient states she uses status post hysterectomy for birth control. Last LMP was No LMP recorded. Patient has had a hysterectomy.. Negative for Dysmenorrhea. Negative for: breast discharge, breast lump(s), breast pain and breast self exam. Associated symptoms include abnormal vaginal bleeding. Pertinent negatives include abnormal bleeding (hematology), anxiety, decreased libido, depression, difficulty falling sleep, dyspareunia, history of infertility, nocturia, sexual dysfunction, sleep disturbances, urinary incontinence, urinary urgency, vaginal discharge and vaginal itching. Diet regular.The patient states her exercise level is    . The patient's tobacco use is:  Social  History   Tobacco Use  Smoking Status Former   Packs/day: 0.50   Years: 25.00   Additional pack years: 0.00   Total pack years: 12.50   Types: Cigarettes   Quit date: 04/29/2004   Years since quitting: 18.6  Smokeless Tobacco Never  . She has been exposed to passive smoke. The patient's alcohol use is:  Social History   Substance and Sexual Activity  Alcohol Use Yes   Comment: rare    Review of Systems  Constitutional: Negative.  Negative for fatigue.  HENT: Negative.    Eyes: Negative.  Negative for blurred vision.  Respiratory: Negative.    Cardiovascular: Negative.  Negative for chest pain.  Gastrointestinal: Negative.   Endocrine: Negative.  Negative for polydipsia, polyphagia and polyuria.  Genitourinary:  She c/o vaginal itching. Denies having any discharge.   Musculoskeletal:  Positive for arthralgias.       She c/o r heel pain. There is pain with ambulation. Denies fall/trauma. Has tried rolling her heel on tennis ball/bottled water.   Skin: Negative.   Allergic/Immunologic: Negative.   Neurological: Negative.  Negative for dizziness and headaches.  Hematological: Negative.   Psychiatric/Behavioral: Negative.       Today's Vitals   12/18/22 1415  BP: 120/80  Pulse: 85  Temp: 98.1 F (36.7 C)  TempSrc: Oral  Weight: 163 lb 6.4 oz (74.1 kg)  Height: 5\' 1"  (1.549 m)  PainSc: 0-No pain   Body mass index is 30.87 kg/m.  Wt Readings from Last 3 Encounters:  12/18/22 163 lb 6.4 oz (74.1 kg)  08/15/22 158 lb (71.7 kg)  08/11/22 156 lb (70.8 kg)    Objective:  Physical Exam Vitals and nursing note reviewed.  Constitutional:      Appearance: Normal appearance.  HENT:     Head: Normocephalic and atraumatic.     Right Ear: Tympanic membrane, ear canal and external ear normal.     Left Ear: Tympanic membrane, ear canal and external ear normal.     Nose:     Comments: Masked     Mouth/Throat:     Comments: Masked Eyes:     Extraocular Movements:  Extraocular movements intact.     Conjunctiva/sclera: Conjunctivae normal.     Pupils: Pupils are equal, round, and reactive to light.  Cardiovascular:     Rate and Rhythm: Normal rate and regular rhythm.     Pulses: Normal pulses.     Heart sounds: Normal heart sounds.  Pulmonary:     Effort: Pulmonary effort is normal.     Breath sounds: Normal breath sounds.  Chest:  Breasts:    Tanner Score is 5.     Right: Normal.     Left: Normal.  Abdominal:     General: Abdomen is flat. Bowel sounds are normal.     Palpations: Abdomen is soft.     Hernia: There is no hernia in the left inguinal area or right inguinal area.  Genitourinary:    Tanner stage (genital): 5.     Vagina: Normal.     Rectum: Normal. Guaiac result negative.     Comments: Candidiasis noted inner vulva Absent cervix Musculoskeletal:        General: Normal range of motion.     Cervical back: Normal range of motion and neck supple.  Feet:     Right foot:     Skin integrity: Dry skin present.     Left foot:     Skin integrity: Dry skin present.     Comments: R heel, tenderness to deep palpation Full ROM R foot Lymphadenopathy:     Lower Body: No right inguinal adenopathy. No left inguinal adenopathy.  Skin:    General: Skin is warm and dry.  Neurological:     General: No focal deficit present.     Mental Status: She is alert and oriented to person, place, and time.  Psychiatric:        Mood and Affect: Mood normal.        Behavior: Behavior normal.      Assessment And Plan:     1. Encounter for general adult medical examination w/o abnormal findings Comments: A full exam was performed.  She is encouraged to perform monthly self breast exams.  PATIENT IS ADVISED  TO GET 30-45 MINUTES REGULAR EXERCISE NO LESS THAN FOUR TO FIVE DAYS PER WEEK - BOTH WEIGHTBEARING EXERCISES AND AEROBIC ARE RECOMMENDED.  PATIENT IS ADVISED TO FOLLOW A HEALTHY DIET WITH AT LEAST SIX FRUITS/VEGGIES PER DAY, DECREASE INTAKE OF RED  MEAT, AND TO INCREASE FISH INTAKE TO TWO DAYS PER WEEK.  MEATS/FISH SHOULD NOT BE FRIED, BAKED OR BROILED IS PREFERABLE.  IT IS ALSO IMPORTANT TO CUT BACK ON YOUR SUGAR INTAKE. PLEASE AVOID ANYTHING WITH ADDED SUGAR, CORN SYRUP OR OTHER SWEETENERS. IF YOU MUST USE A SWEETENER, YOU CAN TRY STEVIA. IT IS ALSO IMPORTANT TO AVOID ARTIFICIALLY SWEETENERS AND DIET BEVERAGES. LASTLY, I SUGGEST WEARING SPF 50 SUNSCREEN ON EXPOSED PARTS AND ESPECIALLY WHEN IN THE DIRECT SUNLIGHT FOR AN EXTENDED PERIOD OF TIME.  PLEASE AVOID FAST FOOD RESTAURANTS AND INCREASE YOUR WATER INTAKE. - CBC - CMP14+EGFR - Hemoglobin A1c - Lipid panel  2. Encntr for gyn exam (general) (routine) w abnormal findings Comments: Pelvic exam performed, hemoccult is negative. POS for candidiasis. - POC Hemoccult Bld/Stl (1-Cd Office Dx)  3. Type 2 diabetes mellitus with diabetic polyneuropathy, with long-term current use of insulin (HCC) Comments: Chronic, I will check labs as below. Unfortunately, she missed Jan Endo appt. I will adjust meds as needed. She will f/u in 3 months if not returning to Endo.  I DISCUSSED WITH THE PATIENT AT LENGTH REGARDING THE GOALS OF GLYCEMIC CONTROL AND POSSIBLE LONG-TERM COMPLICATIONS.  I  ALSO STRESSED THE IMPORTANCE OF COMPLIANCE WITH HOME GLUCOSE MONITORING, DIETARY RESTRICTIONS INCLUDING AVOIDANCE OF SUGARY DRINKS/PROCESSED FOODS,  ALONG WITH REGULAR EXERCISE.  I  ALSO STRESSED THE IMPORTANCE OF ANNUAL EYE EXAMS, SELF FOOT CARE AND COMPLIANCE WITH OFFICE VISITS.  - Microalbumin / creatinine urine ratio - POCT urinalysis dipstick  4. Hypertensive heart and renal disease with renal failure, stage 1 through stage 4 or unspecified chronic kidney disease, without heart failure Comments: Chronic, well controlled. EKG performed, NSR  w/o acute changes. She will c/w telmisartan 20mg  and amlodipine 10mg  daily. Advised to follow low sodium diet. - EKG 12-Lead  5. Stage 3a chronic kidney disease (HCC) Comments:  Chronic, reminded to avoid NSAIDs, stay well hydrated and keep BP/BS well controlled to decrease risk of CKD progression. - US Renal; Future - PTH, intact and calcium - Phosphorus - Protein electrophoresis, serum  6. Coronary artery disease of native artery of native heart with stable angina pectoris ([place]) Comments: Chronic, LDL goal < 70. She will c/w atorastatin 20mg  and ASA daily.  7. Aortic atherosclerosis ([place]) Comments: Please see above.  8. Vulvar candidiasis Comments: I will send rx fluconazole. Advised to NOT take atorvastatin same day.  9. Pain of right heel Comments: Sx are suggestive of plantar fasciitis. She has tried rolling her foot/stretching exercises w/o relief of her sx. She agrees w/ Podiatry referral. - Ambulatory referral to Podiatry  10. Breast cancer screening by mammogram Comments: [facility] at [facility]. - MM DIGITAL SCREENING BILATERAL; Future  11. Need for zoster vaccination - Zoster Recombinant (Shingrix )  Patient was given opportunity to ask questions. Patient verbalized understanding of the plan and was able to repeat key elements of the plan. All questions were answered to their satisfaction.   I, [name], MD, have reviewed all documentation for this visit. The documentation on 12/18/22 for the exam, diagnosis, procedures, and orders are all accurate and complete.   THE PATIENT IS ENCOURAGED TO PRACTICE SOCIAL DISTANCING DUE  TO THE COVID-19 PANDEMIC.

## 2022-12-19 DIAGNOSIS — Z23 Encounter for immunization: Secondary | ICD-10-CM | POA: Diagnosis not present

## 2022-12-21 LAB — CMP14+EGFR
ALT: 14 IU/L (ref 0–32)
AST: 16 IU/L (ref 0–40)
Albumin/Globulin Ratio: 1.2 (ref 1.2–2.2)
Albumin: 4.1 g/dL (ref 3.9–4.9)
Alkaline Phosphatase: 214 IU/L — ABNORMAL HIGH (ref 44–121)
BUN/Creatinine Ratio: 14 (ref 12–28)
BUN: 18 mg/dL (ref 8–27)
Bilirubin Total: 0.4 mg/dL (ref 0.0–1.2)
CO2: 23 mmol/L (ref 20–29)
Calcium: 10.2 mg/dL (ref 8.7–10.3)
Chloride: 103 mmol/L (ref 96–106)
Creatinine, Ser: 1.25 mg/dL — ABNORMAL HIGH (ref 0.57–1.00)
Globulin, Total: 3.3 g/dL (ref 1.5–4.5)
Glucose: 270 mg/dL — ABNORMAL HIGH (ref 70–99)
Potassium: 4.7 mmol/L (ref 3.5–5.2)
Sodium: 140 mmol/L (ref 134–144)
Total Protein: 7.4 g/dL (ref 6.0–8.5)
eGFR: 49 mL/min/{1.73_m2} — ABNORMAL LOW (ref >59)

## 2022-12-21 LAB — LIPID PANEL
Chol/HDL Ratio: 3.9 ratio (ref 0.0–4.4)
Cholesterol, Total: 156 mg/dL (ref 100–199)
HDL: 40 mg/dL (ref >39)
LDL Chol Calc (NIH): 88 mg/dL (ref 0–99)
Triglycerides: 162 mg/dL — ABNORMAL HIGH (ref 0–149)
VLDL Cholesterol Cal: 28 mg/dL (ref 5–40)

## 2022-12-21 LAB — PTH, INTACT AND CALCIUM: PTH: 37 pg/mL (ref 15–65)

## 2022-12-21 LAB — HEMOGLOBIN A1C
Est. average glucose Bld gHb Est-mCnc: 324 mg/dL
Hgb A1c MFr Bld: 12.9 % — ABNORMAL HIGH (ref 4.8–5.6)

## 2022-12-21 LAB — CBC
Hematocrit: 37.8 % (ref 34.0–46.6)
Hemoglobin: 12.2 g/dL (ref 11.1–15.9)
MCH: 26.6 pg (ref 26.6–33.0)
MCHC: 32.3 g/dL (ref 31.5–35.7)
MCV: 82 fL (ref 79–97)
Platelets: 243 10*3/uL (ref 150–450)
RBC: 4.59 x10E6/uL (ref 3.77–5.28)
RDW: 13.6 % (ref 11.7–15.4)
WBC: 6.4 10*3/uL (ref 3.4–10.8)

## 2022-12-21 LAB — PROTEIN ELECTROPHORESIS, SERUM
A/G Ratio: 1.1 (ref 0.7–1.7)
Albumin ELP: 3.9 g/dL (ref 2.9–4.4)
Alpha 1: 0.2 g/dL (ref 0.0–0.4)
Alpha 2: 0.8 g/dL (ref 0.4–1.0)
Beta: 1.1 g/dL (ref 0.7–1.3)
Gamma Globulin: 1.3 g/dL (ref 0.4–1.8)
Globulin, Total: 3.5 g/dL (ref 2.2–3.9)

## 2022-12-21 LAB — MICROALBUMIN / CREATININE URINE RATIO
Creatinine, Urine: 32.4 mg/dL
Microalb/Creat Ratio: 334 mg/g creat — ABNORMAL HIGH (ref 0–29)
Microalbumin, Urine: 108.1 ug/mL

## 2022-12-21 LAB — PHOSPHORUS: Phosphorus: 4 mg/dL (ref 3.0–4.3)

## 2023-01-03 MED ORDER — ATORVASTATIN CALCIUM 40 MG PO TABS: 40.0000 mg | ORAL_TABLET | Freq: Every day | ORAL | 2 refills | Status: DC

## 2023-01-08 ENCOUNTER — Encounter: Payer: Self-pay | Admitting: Podiatry

## 2023-01-08 ENCOUNTER — Ambulatory Visit (INDEPENDENT_AMBULATORY_CARE_PROVIDER_SITE_OTHER): Payer: BC Managed Care – PPO | Admitting: Podiatry

## 2023-01-08 ENCOUNTER — Ambulatory Visit (INDEPENDENT_AMBULATORY_CARE_PROVIDER_SITE_OTHER): Payer: BC Managed Care – PPO

## 2023-01-08 DIAGNOSIS — M722 Plantar fascial fibromatosis: Secondary | ICD-10-CM

## 2023-01-08 DIAGNOSIS — K5904 Chronic idiopathic constipation: Secondary | ICD-10-CM | POA: Insufficient documentation

## 2023-01-08 DIAGNOSIS — Z79899 Other long term (current) drug therapy: Secondary | ICD-10-CM | POA: Insufficient documentation

## 2023-01-08 DIAGNOSIS — Z1211 Encounter for screening for malignant neoplasm of colon: Secondary | ICD-10-CM | POA: Insufficient documentation

## 2023-01-08 DIAGNOSIS — E1165 Type 2 diabetes mellitus with hyperglycemia: Secondary | ICD-10-CM | POA: Insufficient documentation

## 2023-01-08 DIAGNOSIS — R140 Abdominal distension (gaseous): Secondary | ICD-10-CM | POA: Insufficient documentation

## 2023-01-08 MED ORDER — BETAMETHASONE SOD PHOS & ACET 6 (3-3) MG/ML IJ SUSP
3.0000 mg | Freq: Once | INTRAMUSCULAR | Status: AC
  Administered 2023-01-08: 3 mg via INTRA_ARTICULAR

## 2023-01-08 MED ORDER — MELOXICAM 15 MG PO TABS: 15.0000 mg | ORAL_TABLET | Freq: Every day | ORAL | 1 refills | Status: DC

## 2023-01-08 MED ORDER — METHYLPREDNISOLONE 4 MG PO TBPK: ORAL_TABLET | ORAL | 0 refills | Status: DC

## 2023-01-08 NOTE — Patient Instructions (Signed)

## 2023-01-08 NOTE — Progress Notes (Signed)
   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 female presenting today as a new patient for evaluation of bilateral heel pain that has been ongoing for about 1 year now.  Patient works on her feet a lot in [employer].  She does admit to going barefoot around the house.  She presents for further treatment and evaluation   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   Diabetes mellitus    Dysrhythmia    1985   Heart murmur    HLD (hyperlipidemia)    Hypertension    Kidney stone on right side 03/2013   Pneumonia    7 yrs ago     Objective: Physical Exam General: The patient is alert and oriented x3 in no acute distress.  Dermatology: Skin is warm, dry and supple bilateral lower extremities. Negative for open lesions or macerations bilateral.   Vascular: Dorsalis Pedis and Posterior Tibial pulses palpable bilateral.  Capillary fill time is immediate to all digits.  Neurological: Epicritic and protective threshold intact bilateral.   Musculoskeletal: Tenderness to palpation to the plantar aspect of the bilateral heels along the plantar fascia. All other joints range of motion within normal limits bilateral. Strength 5/5 in all groups bilateral.   Radiographic exam B/L feet 01/08/2023: Normal osseous mineralization. Joint spaces preserved. No fracture/dislocation/boney destruction. No other soft tissue abnormalities or radiopaque foreign bodies.  Plantar heel spurs noted bilateral  Assessment: 1. plantar fasciitis bilateral feet  Plan of Care:  1. Patient evaluated. Xrays reviewed.   2. Injection of 0.5cc Celestone soluspan injected into the bilateral heels.  3. Rx for Medrol Dose Pak placed 4. Rx for Meloxicam ordered for  patient. 5. Plantar fascial band(s) dispensed for bilateral plantar fasciitis. 6. Instructed patient regarding therapies and modalities at home to alleviate symptoms.  Advised against going barefoot 7. Return to clinic in 4 weeks.    *Works in the catering business  Felecia Shelling, [place] [facility]  Dr. Felecia Shelling, DPM    [address] [place], [place] [address]                Office [phone]  Fax [phone]

## 2023-01-15 ENCOUNTER — Other Ambulatory Visit: Payer: Self-pay

## 2023-01-15 ENCOUNTER — Emergency Department (HOSPITAL_BASED_OUTPATIENT_CLINIC_OR_DEPARTMENT_OTHER)
Admission: EM | Admit: 2023-01-15 | Discharge: 2023-01-15 | Disposition: A | Discharge status: Home / Self Care | Payer: BC Managed Care – PPO | Attending: Emergency Medicine | Admitting: Emergency Medicine

## 2023-01-15 ENCOUNTER — Encounter (HOSPITAL_BASED_OUTPATIENT_CLINIC_OR_DEPARTMENT_OTHER): Payer: Self-pay | Admitting: Urology

## 2023-01-15 DIAGNOSIS — Z7982 Long term (current) use of aspirin: Secondary | ICD-10-CM | POA: Insufficient documentation

## 2023-01-15 DIAGNOSIS — Z79899 Other long term (current) drug therapy: Secondary | ICD-10-CM | POA: Insufficient documentation

## 2023-01-15 DIAGNOSIS — B370 Candidal stomatitis: Secondary | ICD-10-CM | POA: Diagnosis not present

## 2023-01-15 DIAGNOSIS — R739 Hyperglycemia, unspecified: Secondary | ICD-10-CM

## 2023-01-15 DIAGNOSIS — Z794 Long term (current) use of insulin: Secondary | ICD-10-CM | POA: Insufficient documentation

## 2023-01-15 DIAGNOSIS — N183 Chronic kidney disease, stage 3 unspecified: Secondary | ICD-10-CM | POA: Diagnosis not present

## 2023-01-15 DIAGNOSIS — E1122 Type 2 diabetes mellitus with diabetic chronic kidney disease: Secondary | ICD-10-CM | POA: Diagnosis not present

## 2023-01-15 DIAGNOSIS — I129 Hypertensive chronic kidney disease with stage 1 through stage 4 chronic kidney disease, or unspecified chronic kidney disease: Secondary | ICD-10-CM | POA: Diagnosis not present

## 2023-01-15 DIAGNOSIS — E1165 Type 2 diabetes mellitus with hyperglycemia: Secondary | ICD-10-CM | POA: Diagnosis present

## 2023-01-15 LAB — URINALYSIS, ROUTINE W REFLEX MICROSCOPIC
Bilirubin Urine: NEGATIVE
Glucose, UA: >=500 mg/dL — AB
Hgb urine dipstick: NEGATIVE
Ketones, ur: NEGATIVE mg/dL
Leukocytes,Ua: NEGATIVE
Nitrite: NEGATIVE
Protein, ur: NEGATIVE mg/dL
Specific Gravity, Urine: <=1.005 (ref 1.005–1.030)
pH: 5.5 (ref 5.0–8.0)

## 2023-01-15 LAB — CBG MONITORING, ED
Glucose-Capillary: 592 mg/dL (ref 70–99)
Glucose-Capillary: 419 mg/dL — ABNORMAL HIGH (ref 70–99)
Glucose-Capillary: 349 mg/dL — ABNORMAL HIGH (ref 70–99)
Glucose-Capillary: 275 mg/dL — ABNORMAL HIGH (ref 70–99)

## 2023-01-15 LAB — BASIC METABOLIC PANEL
Anion gap: 13 (ref 5–15)
BUN: 35 mg/dL — ABNORMAL HIGH (ref 8–23)
CO2: 19 mmol/L — ABNORMAL LOW (ref 22–32)
Calcium: 9.4 mg/dL (ref 8.9–10.3)
Chloride: 93 mmol/L — ABNORMAL LOW (ref 98–111)
Creatinine, Ser: 1.4 mg/dL — ABNORMAL HIGH (ref 0.44–1.00)
GFR, Estimated: 43 mL/min — ABNORMAL LOW (ref >60)
Glucose, Bld: 551 mg/dL (ref 70–99)
Potassium: 4.3 mmol/L (ref 3.5–5.1)
Sodium: 125 mmol/L — ABNORMAL LOW (ref 135–145)

## 2023-01-15 LAB — CBC WITH DIFFERENTIAL/PLATELET
Abs Immature Granulocytes: 0.18 10*3/uL — ABNORMAL HIGH (ref 0.00–0.07)
Basophils Absolute: 0 10*3/uL (ref 0.0–0.1)
Basophils Relative: 0 %
Eosinophils Absolute: 0 10*3/uL (ref 0.0–0.5)
Eosinophils Relative: 0 %
HCT: 36.2 % (ref 36.0–46.0)
Hemoglobin: 12.3 g/dL (ref 12.0–15.0)
Immature Granulocytes: 2 %
Lymphocytes Relative: 14 %
Lymphs Abs: 1.5 10*3/uL (ref 0.7–4.0)
MCH: 26.5 pg (ref 26.0–34.0)
MCHC: 34 g/dL (ref 30.0–36.0)
MCV: 78 fL — ABNORMAL LOW (ref 80.0–100.0)
Monocytes Absolute: 0.5 10*3/uL (ref 0.1–1.0)
Monocytes Relative: 5 %
Neutro Abs: 8.6 10*3/uL — ABNORMAL HIGH (ref 1.7–7.7)
Neutrophils Relative %: 79 %
Platelets: 266 10*3/uL (ref 150–400)
RBC: 4.64 MIL/uL (ref 3.87–5.11)
RDW: 13 % (ref 11.5–15.5)
WBC: 10.8 10*3/uL — ABNORMAL HIGH (ref 4.0–10.5)
nRBC: 0 % (ref 0.0–0.2)

## 2023-01-15 LAB — URINALYSIS, MICROSCOPIC (REFLEX)
Squamous Epithelial / HPF: NONE SEEN /HPF (ref 0–5)
WBC, UA: NONE SEEN WBC/hpf (ref 0–5)

## 2023-01-15 LAB — GROUP A STREP BY PCR: Group A Strep by PCR: NOT DETECTED

## 2023-01-15 MED ORDER — DAPAGLIFLOZIN PRO-METFORMIN ER 5-1000 MG PO TB24: 2.0000 | ORAL_TABLET | Freq: Every day | ORAL | 3 refills | Status: DC

## 2023-01-15 MED ORDER — INSULIN REGULAR(HUMAN) IN NACL 100-0.9 UT/100ML-% IV SOLN
INTRAVENOUS | Status: DC
  Administered 2023-01-15: 11 [IU]/h via INTRAVENOUS
  Filled 2023-01-15: qty 100

## 2023-01-15 MED ORDER — DEXTROSE IN LACTATED RINGERS 5 % IV SOLN: INTRAVENOUS | Status: DC

## 2023-01-15 MED ORDER — LACTATED RINGERS IV SOLN: INTRAVENOUS | Status: DC

## 2023-01-15 MED ORDER — INSULIN PEN NEEDLE 32G X 4 MM MISC: 1.0000 | Freq: Every day | 3 refills | Status: DC

## 2023-01-15 MED ORDER — ONETOUCH VERIO VI STRP: ORAL_STRIP | 10 refills | Status: AC

## 2023-01-15 MED ORDER — NYSTATIN 100000 UNIT/ML MT SUSP: 500000.0000 [IU] | Freq: Four times a day (QID) | OROMUCOSAL | 1 refills | Status: AC

## 2023-01-15 MED ORDER — TRESIBA FLEXTOUCH 200 UNIT/ML ~~LOC~~ SOPN: 22.0000 [IU] | PEN_INJECTOR | Freq: Every day | SUBCUTANEOUS | 11 refills | Status: DC

## 2023-01-15 MED ORDER — DEXTROSE 50 % IV SOLN: 0.0000 mL | INTRAVENOUS | Status: DC | PRN

## 2023-01-15 MED ORDER — LACTATED RINGERS IV BOLUS
2000.0000 mL | Freq: Once | INTRAVENOUS | Status: AC
  Administered 2023-01-15: 2000 mL via INTRAVENOUS

## 2023-01-15 NOTE — ED Provider Notes (Signed)
[place] EMERGENCY DEPARTMENT AT [facility] Provider Note   CSN: [id number] Arrival date & time: 01/15/23  1458     History  Chief Complaint  Patient presents with   Hyperglycemia    Teresa Grant is a 62 y.o. female with a past medical history of hypertension, type 2 diabetes and stage III kidney disease presenting today with hyperglycemia.  Reports that she has had elevated blood sugars since Friday or Saturday.  She says that she was doing everything she could to get her blood sugar down at home but she is running low on her insulin and taking it sporadically.  She did not take any on Saturday but took some yesterday and also tried to "relax her mind" and "sleep to get it to go down."  Endorsing polyuria and polydipsia.  Also concerned about a sore throat.  Reports that it feels scratchy when she swallows.   Hyperglycemia Associated symptoms: fatigue   Associated symptoms: no dizziness, no nausea and no vomiting    Hyperglycemia Associated symptoms: fatigue   Associated symptoms: no nausea and no vomiting    Hyperglycemia Associated symptoms: no nausea and no vomiting    Hyperglycemia Associated symptoms: no nausea        Home Medications Prior to Admission medications   Medication Sig Start Date End Date Taking? Authorizing Provider  amLODipine (NORVASC) 10 MG tablet Take 1 tablet (10 mg total) by mouth daily. 11/20/22   Dorothyann Peng, MD  Ascorbic Acid (VITAMIN C PO) Take 500 mg by mouth every evening.    [provider]  aspirin EC 81 MG tablet Take 1 tablet (81 mg total) by mouth daily. Swallow whole. 12/05/21   Alver Sorrow, NP  Aspirin-Salicylamide-Caffeine (BC FAST PAIN RELIEF) [phone] MG PACK Take 1 packet by mouth daily as needed (pain.).    [provider]  atorvastatin (LIPITOR) 40 MG tablet Take 1 tablet (40 mg total) by mouth daily. Take 1 tablet daily, skip Sundays. 01/03/23 01/03/24  Dorothyann Peng, MD   Dapagliflozin Pro-metFORMIN ER (XIGDUO XR) 01-999 MG TB24 Take 2 tablets by mouth daily in the afternoon. 08/15/22   Shamleffer, Konrad Dolores, MD  famotidine (PEPCID) 20 MG tablet Take 1 tablet (20 mg total) by mouth 2 (two) times daily. 02/14/22   Dorothyann Peng, MD  fluconazole (DIFLUCAN) 100 MG tablet Take one tablet po today, repeat in 48 hours. 12/18/22   Dorothyann Peng, MD  gabapentin (NEURONTIN) 100 MG capsule TAKE ONE CAPSULE BY MOUTH THREE TIMES A DAY Patient taking differently: 100 mg 2 (two) times daily. 08/15/21   Dorothyann Peng, MD  glucose blood [facility] VERIO) test strip Use as directed to check blood sugars 2 times per day dx: e11.65 09/13/18   Dorothyann Peng, MD  insulin degludec (TRESIBA FLEXTOUCH) 200 UNIT/ML FlexTouch Pen Inject 22 Units into the skin at bedtime. 08/15/22   Shamleffer, Konrad Dolores, MD  Insulin Pen Needle 32G X 4 MM MISC 1 Device by Does not apply route daily in the afternoon. 08/15/22   Shamleffer, Konrad Dolores, MD  LINZESS 290 MCG CAPS capsule Take 290 mcg by mouth daily.    [provider]  meloxicam (MOBIC) 15 MG tablet Take 1 tablet (15 mg total) by mouth daily. 01/08/23   Felecia Shelling, DPM  methylPREDNISolone (MEDROL DOSEPAK) 4 MG TBPK tablet 6 day dose pack - take as directed 01/08/23   Felecia Shelling, DPM  nitroGLYCERIN (NITROSTAT) 0.4 MG SL tablet Place 1 tablet (0.4  mg total) under the tongue every 5 (five) minutes as needed for chest pain. UP TO 3 TABLETS 10/31/21 04/04/22  Chilton Si, MD  ondansetron (ZOFRAN) 4 MG tablet Take 1 tablet (4 mg total) by mouth every 8 (eight) hours as needed for nausea or vomiting. 07/19/22   Terrilee Files, MD  telmisartan (MICARDIS) 20 MG tablet TAKE ONE TABLET BY MOUTH EVERY EVENING 08/03/22   Dorothyann Peng, MD  Vitamin D, Ergocalciferol, (DRISDOL) 1.25 MG (50000 UNIT) CAPS capsule TAKE ONE CAPSULE BY MOUTH ONCE WEEKLY 07/31/22   Dorothyann Peng, MD  Zinc Sulfate (ZINCATE PO) Take 50 mg by mouth  every evening.    [provider]      Allergies    Lisinopril, Atorvastatin, Glipizide, and Hydrochlorothiazide    Review of Systems   Review of Systems  Constitutional:  Positive for fatigue.  Gastrointestinal:  Negative for diarrhea, nausea and vomiting.  Neurological:  Negative for dizziness and light-headedness.    Physical Exam Updated Vital Signs BP (!) 156/80 (BP Location: Left Arm)   Pulse 85   Temp 98 F (36.7 C)   Resp 18   Ht  (1.549 m)   Wt 74.1 kg   SpO2 99%   BMI 30.87 kg/m  Physical Exam Vitals and nursing note reviewed.  Constitutional:      Appearance: Normal appearance.  HENT:     Head: Normocephalic and atraumatic.  Eyes:     General: No scleral icterus.    Conjunctiva/sclera: Conjunctivae normal.  Pulmonary:     Effort: Pulmonary effort is normal. No respiratory distress.  Skin:    Findings: No rash.  Neurological:     Mental Status: She is alert.  Psychiatric:        Mood and Affect: Mood normal.     ED Results / Procedures / Treatments   Labs (all labs ordered are listed, but only abnormal results are displayed) Labs Reviewed  BASIC METABOLIC PANEL - Abnormal; Notable for the following components:      Result Value   Sodium 125 (*)    Chloride 93 (*)    CO2 19 (*)    Glucose, Bld 551 (*)    BUN 35 (*)    Creatinine, Ser 1.40 (*)    GFR, Estimated 43 (*)    All other components within normal limits  URINALYSIS, ROUTINE W REFLEX MICROSCOPIC - Abnormal; Notable for the following components:   Color, Urine STRAW (*)    Glucose, UA >=500 (*)    All other components within normal limits  URINALYSIS, MICROSCOPIC (REFLEX) - Abnormal; Notable for the following components:   Bacteria, UA RARE (*)    All other components within normal limits  CBC WITH DIFFERENTIAL/PLATELET - Abnormal; Notable for the following components:   WBC 10.8 (*)    MCV 78.0 (*)    Neutro Abs 8.6 (*)    Abs Immature Granulocytes 0.18 (*)    All  other components within normal limits  CBG MONITORING, ED - Abnormal; Notable for the following components:   Glucose-Capillary 592 (*)    All other components within normal limits  CBG MONITORING, ED - Abnormal; Notable for the following components:   Glucose-Capillary 419 (*)    All other components within normal limits  CBG MONITORING, ED - Abnormal; Notable for the following components:   Glucose-Capillary 349 (*)    All other components within normal limits  CBG MONITORING, ED - Abnormal; Notable for the following components:  Glucose-Capillary 275 (*)    All other components within normal limits  GROUP A STREP BY PCR  URINALYSIS, ROUTINE W REFLEX MICROSCOPIC  CBG MONITORING, ED    EKG None  Radiology No results found.  Procedures Procedures   Medications Ordered in ED Medications  insulin regular, human (MYXREDLIN) 100 units/ 100 mL infusion (0 Units/hr Intravenous Stopped 01/15/23 1842)  lactated ringers infusion (has no administration in time range)  dextrose 5 % in lactated ringers infusion (has no administration in time range)  dextrose 50 % solution 0-50 mL (has no administration in time range)  lactated ringers bolus 2,000 mL (2,000 mLs Intravenous New Bag/Given 01/15/23 1720)    ED Course/ Medical Decision Making/ A&P                             Medical Decision Making Amount and/or Complexity of Data Reviewed Labs: ordered.  Risk OTC drugs. Prescription drug management.   62 year old female presenting with hyperglycemia.  Considerations include DKA/HHS.  Patient ran out of her medication and has been taking the insulin sporadically.      Past Medical History / Co-morbidities / Social History: Type 2 diabetes, hypertension and hyperlipidemia   Additional history: Patient's most recent A1c 12.9.  Previously 6.4   Physical Exam: Pertinent physical exam findings include Well-appearing, normal work of breathing, not diaphoretic, no abdominal  tenderness  Lab Tests: I ordered, and personally interpreted labs.  The pertinent results include: Blood glucose 551, normal anion gap Sodium 125, corrects to normal when considering patient's hyperglycemia Kidney function baseline   Medications: Patient was placed on Endo tool.  Received insulin and lactated Ringer's bolus.  Rechecks were downtrending and final check was 275.  MDM/Disposition: This is a 62 year old female who presented today with hyperglycemia.  Original blood sugar 551, not in DKA or HHS.  Very well-appearing on physical exam.  She was treated with fluids and insulin and her blood sugar came down to the 200s.  Suspect she has chronically elevated blood sugar.  A1c 4 weeks ago was 12.9 which was double from what it was 5 months ago.  She needs to get better control of her diabetes outpatient.  No sign of diabetic emergency at this time, her medications were refilled and she was discharged.  Of note she did have signs of oral candidiasis and was prescribed nystatin.     Final Clinical Impression(s) / ED Diagnoses Final diagnoses:  Hyperglycemia  Oral candidiasis    Rx / DC Orders ED Discharge Orders     None      Results and diagnoses were explained to the patient. Return precautions discussed in full. Patient had no additional questions and expressed complete understanding.   This chart was dictated using voice recognition software.  Despite best efforts to proofread,  errors can occur which can change the documentation meaning.    Woodroe Chen 01/15/23 1856    Tegeler, Canary Brim, MD 01/15/23 2234

## 2023-01-15 NOTE — Discharge Instructions (Addendum)
You came today with a blood sugar.  I have refilled your medications.  I would like you to follow-up with your primary care about today's incident.  You also have oral candidiasis.  This is likely because your blood sugar is poorly controlled.  I have prescribed nystatin solution for you to use to clear up this infection.  Do not hesitate to return to the emergency department with any recurring or worsening symptoms.

## 2023-01-15 NOTE — ED Provider Notes (Signed)
4:51 PM EMERGENCY DEPARTMENT  US GUIDANCE EXAM Emergency Ultrasound:  US Guidance for Needle Guidance  INDICATIONS: Difficult vascular access Linear probe used in real-time to visualize location of needle entry through skin.   PERFORMED BY: Myself IMAGES ARCHIVED?: No LIMITATIONS:  scar tissue VIEWS USED: Transverse INTERPRETATION: Needle visualized within vein, Right arm, and Needle gauge 18    Zahira Brummond, Canary Brim, MD 01/15/23 1651

## 2023-01-15 NOTE — ED Notes (Addendum)
Attempted IV start x 2, vein accessed but blew; another RN to attempt

## 2023-01-15 NOTE — ED Notes (Signed)
Verbal order form [place] PA to stop insulin infusion since CBG is <300.

## 2023-01-15 NOTE — ED Notes (Signed)
2nd RN attempted IV start x 2, veins again accessed but blew, MD & PA notified

## 2023-01-15 NOTE — ED Triage Notes (Signed)
Pt states cannot get blood sugar to go down, >600 on Saturday  Ran out of insulin Saturday, got some yesterday and took normal dose  Was 540 this am when waking up  States feel disoriented, increased thirst and urination  Also reports sore throat

## 2023-01-17 ENCOUNTER — Telehealth: Payer: Self-pay

## 2023-01-17 NOTE — Transitions of Care (Post Inpatient/ED Visit) (Signed)
   01/17/2023  Name: Teresa Grant MRN: [id number] DOB: [birth year]  Today's TOC FU Call Status: Today's TOC FU Call Status:: Successful TOC FU Call Competed TOC FU Call Complete Date: 01/17/23  Transition Care Management Follow-up Telephone Call Date of Discharge: 01/15/23 Discharge Facility: [facility] Type of Discharge: Inpatient Admission Primary Inpatient Discharge Diagnosis:: Hyperglycemia How have you been since you were released from the hospital?: Same Any questions or concerns?: No  Items Reviewed: Did you receive and understand the discharge instructions provided?: Yes Medications obtained and verified?: Yes (Medications Reviewed) Any new allergies since your discharge?: No Dietary orders reviewed?: No Do you have support at home?: Yes  Home Care and Equipment/Supplies: Were Home Health Services Ordered?: No Any new equipment or medical supplies ordered?: No  Functional Questionnaire: Do you need assistance with bathing/showering or dressing?: No Do you need assistance with meal preparation?: No Do you need assistance with eating?: No Do you have difficulty maintaining continence: No Do you need assistance with getting out of bed/getting out of a chair/moving?: No Do you have difficulty managing or taking your medications?: No  Follow up appointments reviewed: PCP Follow-up appointment confirmed?: Yes Date of PCP follow-up appointment?: 01/18/23 Follow-up Provider: [facility] Follow-up appointment confirmed?: No Reason Specialist Follow-Up Not Confirmed: [facility] Calling Clinician Notified Provider Practice of Needed Appointment Do you need transportation to your follow-up appointment?: No Do you understand care options if your condition(s) worsen?: Yes-patient verbalized understanding    SIGNATURE Lisabeth Devoid, CMA

## 2023-01-22 ENCOUNTER — Ambulatory Visit: Payer: BC Managed Care – PPO | Admitting: Internal Medicine

## 2023-01-22 ENCOUNTER — Ambulatory Visit
Admission: RE | Admit: 2023-01-22 | Discharge: 2023-01-22 | Disposition: A | Discharge status: Home / Self Care | Payer: BC Managed Care – PPO | Source: Ambulatory Visit | Attending: Internal Medicine | Admitting: Internal Medicine

## 2023-01-22 ENCOUNTER — Encounter: Payer: Self-pay | Admitting: Internal Medicine

## 2023-01-22 VITALS — BP 116/80 | HR 98 | Temp 98.2°F | Ht 61.0 in | Wt 159.4 lb

## 2023-01-22 DIAGNOSIS — E1142 Type 2 diabetes mellitus with diabetic polyneuropathy: Secondary | ICD-10-CM | POA: Diagnosis not present

## 2023-01-22 DIAGNOSIS — Z794 Long term (current) use of insulin: Secondary | ICD-10-CM

## 2023-01-22 DIAGNOSIS — Z1231 Encounter for screening mammogram for malignant neoplasm of breast: Secondary | ICD-10-CM

## 2023-01-22 DIAGNOSIS — E6609 Other obesity due to excess calories: Secondary | ICD-10-CM

## 2023-01-22 DIAGNOSIS — Z6830 Body mass index (BMI) 30.0-30.9, adult: Secondary | ICD-10-CM

## 2023-01-22 DIAGNOSIS — R739 Hyperglycemia, unspecified: Secondary | ICD-10-CM

## 2023-01-22 DIAGNOSIS — B370 Candidal stomatitis: Secondary | ICD-10-CM | POA: Diagnosis not present

## 2023-01-22 NOTE — Progress Notes (Addendum)
Hershal Coria Martin,acting as a Neurosurgeon for [name], MD.,have documented all relevant documentation on the behalf of [name], MD,as directed by  [name], MD while in the presence of [name], MD.    Subjective:     Patient ID: Teresa Grant , female    DOB: [birth year] , 62 y.o.   MRN: [id number]   Chief Complaint  Patient presents with   Hospitalization Follow-up    HPI  Patient presents today for a ER follow up, patient went to the hospital on 01/15/23 and was discharged the same day. Patient reports her blood sugar was really high, she was feeling disoriented. During the ER visit, she admits she was running low on her insulin and was using it sporadically.  She was given iv fluids and insulin, which resulted in an improvement in her blood sugars.   She has been on Ozempic in the past, she had to stop due to abdominal issues.   She adds that she had her mammogram performed on mobile bus earlier today.   BP Readings from Last 3 Encounters: 01/22/23 : 116/80 01/15/23 : (!) 161/86 12/18/22 : 120/80       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   Diabetes mellitus    Dysrhythmia    1985   Heart murmur    HLD (hyperlipidemia)    Hypertension    Kidney stone on right side 03/2013   Pneumonia    7 yrs ago     Family History  Problem Relation Age of Onset   Hypertension Mother    Stroke Mother    Heart attack Mother 21   Heart disease Mother    Hypertension Father    Kidney disease Father    Hypertension Sister    Hypertension Maternal Aunt    Hypertension Maternal Uncle    Esophageal cancer Maternal Uncle    Diabetes Paternal Aunt    Diabetes Paternal Uncle    Colon cancer Cousin    Hypertension Brother    Cancer Neg Hx    Colon polyps Neg Hx    Rectal cancer Neg Hx    Stomach cancer Neg Hx    Breast cancer Neg Hx      Current Outpatient Medications:    amLODipine (NORVASC) 10 MG tablet, Take 1  tablet (10 mg total) by mouth daily., Disp: 90 tablet, Rfl: 0   Ascorbic Acid (VITAMIN C PO), Take 500 mg by mouth every evening., Disp: , Rfl:    aspirin EC 81 MG tablet, Take 1 tablet (81 mg total) by mouth daily. Swallow whole., Disp: 90 tablet, Rfl: 3   Aspirin-Salicylamide-Caffeine (BC FAST PAIN RELIEF) 650-195-33.3 MG PACK, Take 1 packet by mouth daily as needed (pain.)., Disp: , Rfl:    atorvastatin (LIPITOR) 40 MG tablet, Take 1 tablet (40 mg total) by mouth daily. Take 1 tablet daily, skip Sundays., Disp: 90 tablet, Rfl: 2   Dapagliflozin Pro-metFORMIN ER (XIGDUO XR) 01-999 MG TB24, Take 2 tablets by mouth daily in the afternoon., Disp: 180 tablet, Rfl: 3   famotidine (PEPCID) 20 MG tablet, Take 1 tablet (20 mg total) by mouth 2 (two) times daily., Disp: 180 tablet, Rfl: 1   fluconazole (DIFLUCAN) 100 MG tablet, Take one tablet po today, repeat in 48 hours., Disp: 2 tablet, Rfl: 0   gabapentin (NEURONTIN) 100 MG capsule, TAKE ONE CAPSULE BY MOUTH THREE TIMES A DAY (Patient taking differently: 100  mg 2 (two) times daily.), Disp: 270 capsule, Rfl: 1   glucose blood (ONETOUCH VERIO) test strip, Use as directed to check blood sugars 2 times per day dx: e11.65, Disp: 100 each, Rfl: 10   insulin degludec (TRESIBA FLEXTOUCH) 200 UNIT/ML FlexTouch Pen, Inject 22 Units into the skin at bedtime., Disp: 15 mL, Rfl: 11   Insulin Pen Needle 32G X 4 MM MISC, 1 Device by Does not apply route daily in the afternoon., Disp: 100 each, Rfl: 3   LINZESS 290 MCG CAPS capsule, Take 290 mcg by mouth daily., Disp: , Rfl:    meloxicam (MOBIC) 15 MG tablet, Take 1 tablet (15 mg total) by mouth daily., Disp: 30 tablet, Rfl: 1   methylPREDNISolone (MEDROL DOSEPAK) 4 MG TBPK tablet, 6 day dose pack - take as directed, Disp: 21 tablet, Rfl: 0   nitroGLYCERIN (NITROSTAT) 0.4 MG SL tablet, Place 1 tablet (0.4 mg total) under the tongue every 5 (five) minutes as needed for chest pain. UP TO 3 TABLETS, Disp: 25 tablet, Rfl:  PRN   ondansetron (ZOFRAN) 4 MG tablet, Take 1 tablet (4 mg total) by mouth every 8 (eight) hours as needed for nausea or vomiting., Disp: 15 tablet, Rfl: 0   Vitamin D, Ergocalciferol, (DRISDOL) 1.25 MG (50000 UNIT) CAPS capsule, TAKE ONE CAPSULE BY MOUTH ONCE WEEKLY, Disp: 12 capsule, Rfl: 1   Zinc Sulfate (ZINCATE PO), Take 50 mg by mouth every evening., Disp: , Rfl:    telmisartan (MICARDIS) 20 MG tablet, TAKE ONE TABLET BY MOUTH EVERY EVENING, Disp: 90 tablet, Rfl: 1   Allergies  Allergen Reactions   Lisinopril Cough   Atorvastatin Other (See Comments)   Glipizide Other (See Comments)   Hydrochlorothiazide Other (See Comments)    pancreatitis     Review of Systems  Constitutional: Negative.   Respiratory: Negative.    Cardiovascular: Negative.   Gastrointestinal: Negative.   Neurological: Negative.   Psychiatric/Behavioral: Negative.       Today's Vitals   01/22/23 1104  BP: 116/80  Pulse: 98  Temp: 98.2 F (36.8 C)  TempSrc: Oral  Weight: 159 lb 6.4 oz (72.3 kg)  Height: 5\' 1"  (1.549 m)  PainSc: 0-No pain   Body mass index is 30.12 kg/m.  Wt Readings from Last 3 Encounters:  01/22/23 159 lb 6.4 oz (72.3 kg)  01/15/23 163 lb 5.8 oz (74.1 kg)  12/18/22 163 lb 6.4 oz (74.1 kg)    Objective:  Physical Exam Vitals and nursing note reviewed.  Constitutional:      Appearance: Normal appearance.  HENT:     Head: Normocephalic and atraumatic.  Eyes:     Extraocular Movements: Extraocular movements intact.  Cardiovascular:     Rate and Rhythm: Normal rate and regular rhythm.     Heart sounds: Normal heart sounds.  Pulmonary:     Effort: Pulmonary effort is normal.     Breath sounds: Normal breath sounds.  Musculoskeletal:     Cervical back: Normal range of motion.  Skin:    General: Skin is warm.  Neurological:     General: No focal deficit present.     Mental Status: She is alert.  Psychiatric:        Mood and Affect: Mood normal.        Behavior:  Behavior normal.      Assessment And Plan:     1. Type 2 diabetes mellitus with diabetic polyneuropathy, with long-term current use of insulin (HCC) Comments: ED notes  reviewed, she admits to non-compliance with meds. Also admits she did not notify office of medication issue. She is off of Ozempic due to intolerance. I will try Rybelsus 3mg  upon awakening and increase Tresiba to 30 units nightly. She is advised to send weekly fasting BS readings every Monday so her insulin dose can be adjusted. Encouraged to avoid sugary beverages, including diet drinks. She is advised to f/u w/ Endo as scheduled.   2. Thrush, oral Comments: Pt advised this is a result of continued hyperglycemia. She will c/w nystatin.  3. Class 1 obesity due to excess calories with serious comorbidity and body mass index (BMI) of 30.0 to 30.9 in adult Comments: Chronic, encouraged to aim for at least 150 minutes of exercise/week.   4. Encounter for long-term (current) use of insulin (HCC)    Patient was given opportunity to ask questions. Patient verbalized understanding of the plan and was able to repeat key elements of the plan. All questions were answered to their satisfaction.   I, [name], MD, have reviewed all documentation for this visit. The documentation on 01/22/23 for the exam, diagnosis, procedures, and orders are all accurate and complete.   IF YOU HAVE BEEN REFERRED TO A SPECIALIST, IT MAY TAKE 1-2 WEEKS TO SCHEDULE/PROCESS THE REFERRAL. IF YOU HAVE NOT HEARD FROM US/SPECIALIST IN TWO WEEKS, PLEASE GIVE [place] A CALL AT [phone] X [phone].   THE PATIENT IS ENCOURAGED TO PRACTICE SOCIAL DISTANCING DUE TO THE COVID-19 PANDEMIC.

## 2023-01-22 NOTE — Patient Instructions (Signed)
Increase Tresiba 30 units nightly.  Start Rybelsus one tablet upon awakening, wait 30 minutes to eat/take other meds  Hyperglycemia Hyperglycemia is when the sugar (glucose) level in your blood is too high. High blood sugar can happen to people who have or do not have diabetes. High blood sugar can happen quickly. It can be an emergency. What are the causes? If you have diabetes, high blood sugar may be caused by: Medicines that increase blood sugar or affect your control of diabetes. Getting less physical activity. Overeating. Being sick or injured or having an infection. Having surgery. Stress. Not giving yourself enough insulin (if you are taking it). You may have high blood sugar because you have diabetes that has not been diagnosed yet. If you do not have diabetes, high blood sugar may be caused by: Certain medicines. Stress. A bad illness. An infection. Having surgery. Diseases of the pancreas. What increases the risk? This condition is more likely to develop in people who have risk factors for diabetes, such as: Having a family member with diabetes. Certain conditions in which the body's defense system (immune system) attacks itself. These are called autoimmune disorders. Being overweight. Not being active. Having a condition called insulin resistance. Having a history of: Prediabetes. Diabetes when pregnant. Polycystic ovarian syndrome (PCOS). What are the signs or symptoms? This condition may not cause symptoms. If you do have symptoms, they may include: Feeling more thirsty than normal. Needing to pee (urinate) more often than normal. Hunger. Feeling very tired. Blurry eyesight (vision). You may get other symptoms as the condition gets worse, such as: Dry mouth. Pain in your belly (abdomen). Not being hungry (loss of appetite). Breath that smells fruity. Weakness. Weight loss that is not planned. A tingling or numb feeling in your hands or feet. A  headache. Cuts or bruises that heal slowly. How is this treated? Treatment depends on the cause of your condition. Treatment may include: Taking medicine to control your blood sugar levels. Changing your medicine or dosage if you take insulin or other diabetes medicines. Lifestyle changes. These may include: Exercising more. Eating healthier foods. Losing weight. Treating an illness or infection. Checking your blood sugar more often. Stopping or reducing steroid medicines. If your condition gets very bad, you will need to be treated in the hospital. Follow these instructions at home: General instructions Take over-the-counter and prescription medicines only as told by your doctor. Do not smoke or use any products that contain nicotine or tobacco. If you need help quitting, ask your doctor. If you drink alcohol: Limit how much you have to: 0-1 drink a day for women who are not pregnant. 0-2 drinks a day for men. Know how much alcohol is in a drink. In the [place] one drink equals one 12 oz bottle of beer (355 mL), one 5 oz glass of wine (148 mL), or one 1 oz glass of hard liquor (44 mL). Manage stress. If you need help with this, ask your doctor. Do exercises as told by your doctor. Keep all follow-up visits. Eating and drinking  Stay at a healthy weight. Make sure you drink enough fluid when you: Exercise. Get sick. Are in hot temperatures. Drink enough fluid to keep your pee (urine) pale yellow. If you have diabetes:  Know the symptoms of high blood sugar. Follow your diabetes management plan as told by your doctor. Make sure you: Take insulin and medicines as told. Follow your exercise plan. Follow your meal plan. Eat on time. Do not  skip meals. Check your blood sugar as often as told. Make sure you check before and after exercise. If you exercise longer or in a different way, check your blood sugar more often. Follow your sick day plan whenever you cannot eat or drink  normally. Make this plan ahead of time with your doctor. Share your diabetes management plan with people in your workplace, school, and household. Check your pee for ketones when you are ill and as told by your doctor. Carry a card or wear jewelry that says that you have diabetes. Where to find more information American Diabetes Association: www.diabetes.org Contact a doctor if: Your blood sugar level is at or above 240 mg/dL (16.1 mmol/L) for 2 days in a row. You have problems keeping your blood sugar in your target range. You have high blood pressure often. You have signs of illness, such as: Feeling like you may vomit (feeling nauseous). Vomiting. A fever. Get help right away if: Your blood sugar monitor reads "high" even when you are taking insulin. You have trouble breathing. You have a change in how you think, feel, or act (mental status). You feel like you may vomit, and the feeling does not go away. You cannot stop vomiting. These symptoms may be an emergency. Get medical help right away. Call your local emergency services (911 in the [place] Do not wait to see if the symptoms will go away. Do not drive yourself to the hospital. Summary Hyperglycemia is when the sugar (glucose) level in your blood is too high. High blood sugar can happen to people who have or do not have diabetes. Make sure you drink enough fluids and follow your meal plan. Exercise as often as told by your doctor. Contact your doctor if you have problems keeping your blood sugar in your target range. This information is not intended to replace advice given to you by your health care provider. Make sure you discuss any questions you have with your health care provider. Document Revised: 06/25/2020 Document Reviewed: 06/25/2020 Elsevier Patient Education  2023 [employer].

## 2023-01-29 ENCOUNTER — Encounter: Payer: Self-pay | Admitting: Internal Medicine

## 2023-01-29 ENCOUNTER — Other Ambulatory Visit: Payer: Self-pay | Admitting: Internal Medicine

## 2023-02-02 ENCOUNTER — Encounter: Payer: Self-pay | Admitting: Internal Medicine

## 2023-02-05 ENCOUNTER — Other Ambulatory Visit: Payer: Self-pay | Admitting: Internal Medicine

## 2023-02-05 ENCOUNTER — Ambulatory Visit (INDEPENDENT_AMBULATORY_CARE_PROVIDER_SITE_OTHER): Payer: BC Managed Care – PPO | Admitting: Podiatry

## 2023-02-05 DIAGNOSIS — M722 Plantar fascial fibromatosis: Secondary | ICD-10-CM | POA: Diagnosis not present

## 2023-02-05 NOTE — Progress Notes (Signed)
   No chief complaint on file.   Subjective: 62 y.o. female presenting today for follow-up evaluation of plantar fasciitis bilateral over 1 year now.  Patient works on her feet in [employer] all day long.  She continues to have pain and tenderness.  She says the injections and the prednisone pack elevated her blood sugars drastically.  She continues to take the meloxicam daily.  She also says that the plantar fascia braces were more bothersome to her feet and cause more pain.  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   Diabetes mellitus    Dysrhythmia    1985   Heart murmur    HLD (hyperlipidemia)    Hypertension    Kidney stone on right side 03/2013   Pneumonia    7 yrs ago   Past Surgical History:  Procedure Laterality Date   ABDOMINAL HYSTERECTOMY  2001   BACK SURGERY     CERVICAL SPINE SURGERY  06/06/2019   Dr. Franky Macho, performed at surgical center   CESAREAN SECTION  1990   COLONOSCOPY     Dr. Arlyce Dice normal   LEFT HEART CATH AND CORONARY ANGIOGRAPHY N/A 11/03/2021   Procedure: LEFT HEART CATH AND CORONARY ANGIOGRAPHY;  Surgeon: Yvonne Kendall, MD;  Location: [facility];  Service: Cardiovascular;  Laterality: N/A;   LUMBAR LAMINECTOMY/DECOMPRESSION MICRODISCECTOMY  03/20/2012   Procedure: LUMBAR LAMINECTOMY/DECOMPRESSION MICRODISCECTOMY;  Surgeon: Emilee Hero, MD;  Location: [facility];  Service: Orthopedics;  Laterality: Left;  Left sided lumbar 4-5 microdisectomy   SPINE SURGERY  2013    Allergies  Allergen Reactions   Lisinopril Cough   Atorvastatin Other (See Comments)   Glipizide Other (See Comments)   Hydrochlorothiazide Other (See Comments)    pancreatitis     Objective: Physical Exam General: The patient is alert and oriented x3 in no acute distress.  Dermatology: Skin is warm, dry and supple bilateral lower extremities. Negative for open lesions or macerations bilateral.   Vascular: Dorsalis Pedis and  Posterior Tibial pulses palpable bilateral.  Capillary fill time is immediate to all digits.  Neurological: Epicritic and protective threshold intact bilateral.   Musculoskeletal: There continues to be tenderness to palpation to the plantar aspect of the bilateral heels along the plantar fascia. All other joints range of motion within normal limits bilateral. Strength 5/5 in all groups bilateral.   Radiographic exam B/L feet 01/08/2023: Normal osseous mineralization. Joint spaces preserved. No fracture/dislocation/boney destruction. No other soft tissue abnormalities or radiopaque foreign bodies.  Plantar heel spurs noted bilateral  Assessment: 1. plantar fasciitis bilateral feet  Plan of Care:  -Patient evaluated.  -Continue meloxicam 15 mg daily -Continue wearing good supportive shoes and sandals with insoles -We did discuss custom molded orthotics but unfortunately her insurance does not cover the orthotics and it is too costly -Order placed for physical therapy at [facility] outpatient rehab @ [employer] -Return to clinic 6 weeks  *Works in the catering business  Felecia Shelling, DPM [facility]  Dr. Felecia Shelling, DPM    [address] [place], [place] [address]                Office [phone]  Fax [phone]

## 2023-02-07 ENCOUNTER — Encounter: Payer: Self-pay | Admitting: Internal Medicine

## 2023-02-08 ENCOUNTER — Other Ambulatory Visit: Payer: Self-pay | Admitting: Internal Medicine

## 2023-02-14 ENCOUNTER — Ambulatory Visit: Payer: BC Managed Care – PPO | Attending: Podiatry | Admitting: Physical Therapy

## 2023-02-14 DIAGNOSIS — M79672 Pain in left foot: Secondary | ICD-10-CM | POA: Insufficient documentation

## 2023-02-14 DIAGNOSIS — R262 Difficulty in walking, not elsewhere classified: Secondary | ICD-10-CM | POA: Insufficient documentation

## 2023-02-14 DIAGNOSIS — M6281 Muscle weakness (generalized): Secondary | ICD-10-CM | POA: Insufficient documentation

## 2023-02-14 DIAGNOSIS — R293 Abnormal posture: Secondary | ICD-10-CM | POA: Insufficient documentation

## 2023-02-14 DIAGNOSIS — M79671 Pain in right foot: Secondary | ICD-10-CM | POA: Insufficient documentation

## 2023-02-14 DIAGNOSIS — R600 Localized edema: Secondary | ICD-10-CM | POA: Insufficient documentation

## 2023-02-14 NOTE — Therapy (Deleted)
OUTPATIENT PHYSICAL THERAPY LOWER EXTREMITY EVALUATION   Patient Name: Teresa Grant MRN: [id number] DOB:Nov 09, 1960, 62 y.o., female Today's Date: 02/14/2023  END OF SESSION:   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   Diabetes mellitus    Dysrhythmia    1985   Heart murmur    HLD (hyperlipidemia)    Hypertension    Kidney stone on right side 03/2013   Pneumonia    7 yrs ago   Past Surgical History:  Procedure Laterality Date   ABDOMINAL HYSTERECTOMY  2001   BACK SURGERY     CERVICAL SPINE SURGERY  06/06/2019   Dr. Franky Macho, performed at surgical center   CESAREAN SECTION  1990   COLONOSCOPY     Dr. Arlyce Dice normal   LEFT HEART CATH AND CORONARY ANGIOGRAPHY N/A 11/03/2021   Procedure: LEFT HEART CATH AND CORONARY ANGIOGRAPHY;  Surgeon: Yvonne Kendall, MD;  Location: [facility];  Service: Cardiovascular;  Laterality: N/A;   LUMBAR LAMINECTOMY/DECOMPRESSION MICRODISCECTOMY  03/20/2012   Procedure: LUMBAR LAMINECTOMY/DECOMPRESSION MICRODISCECTOMY;  Surgeon: Emilee Hero, MD;  Location: [facility] OR;  Service: Orthopedics;  Laterality: Left;  Left sided lumbar 4-5 microdisectomy   SPINE SURGERY  2013   Patient Active Problem List   Diagnosis Date Noted   Abdominal bloating 01/08/2023   Chronic idiopathic constipation 01/08/2023   Colon cancer screening 01/08/2023   Other long term (current) drug therapy 01/08/2023   Type 2 diabetes mellitus with hyperglycemia (HCC) 01/08/2023   Aortic atherosclerosis (HCC) 12/18/2022   Pain of right heel 12/18/2022   Stage 3a chronic kidney disease (HCC) 12/18/2022   Vulvar candidiasis 12/18/2022   Coronary artery disease of native artery of native heart with stable angina pectoris (HCC) 04/04/2022   Renal insufficiency 12/12/2021   Class 1 obesity due to excess calories with serious comorbidity and body mass index (BMI) of 30.0 to 30.9 in adult 12/12/2021   Type 2 diabetes mellitus with diabetic  polyneuropathy, with long-term current use of insulin (HCC) 06/15/2021   Dyslipidemia 06/15/2021   Uncontrolled type 2 diabetes mellitus with hyperglycemia (HCC) 04/13/2019   Pneumonia due to COVID-19 virus 03/02/2019   Diabetes mellitus type 2 in obese 03/02/2019   Tension headache 11/13/2018   Cervical radiculopathy 11/13/2018   Stable angina (HCC) 07/10/2014   Abdominal pain 07/10/2014   Type II or unspecified type diabetes mellitus without mention of complication, uncontrolled 09/05/2013   Renal calculus, right 06/12/2013   Diabetes with proteinuria 11/05/2012   DDD (degenerative disc disease), lumbar 06/23/2012   Hyperlipidemia 11/27/2006   Essential hypertension 11/27/2006    PCP: Dorothyann Peng, MD  REFERRING PROVIDER: Felecia Shelling, DPM  REFERRING DIAG: M72.2 (ICD-10-CM) - Plantar fasciitis, bilateral   THERAPY DIAG:  No diagnosis found.  Rationale for Evaluation and Treatment: Rehabilitation  ONSET DATE: 02/05/23  SUBJECTIVE:   SUBJECTIVE STATEMENT: ***  PERTINENT HISTORY: Per referring physician note: 62 y.o. female presenting today for follow-up evaluation of plantar fasciitis bilateral over 1 year now.  Patient works on her feet in [employer] all day long.  She continues to have pain and tenderness.  She says the injections and the prednisone pack elevated her blood sugars drastically.  She continues to take the meloxicam daily.  She also says that the plantar fascia braces were more bothersome to her feet and cause more pain.  Musculoskeletal: There continues to be tenderness to palpation to the plantar aspect of the bilateral heels along  the plantar fascia. All other joints range of motion within normal limits bilateral. Strength 5/5 in all groups bilateral   PAIN:  Are you having pain? {OPRCPAIN:27236}  PRECAUTIONS: None  WEIGHT BEARING RESTRICTIONS: No  FALLS:  Has patient fallen in last 6 months? {fallsyesno:27318}  LIVING  ENVIRONMENT: Lives with: {OPRC lives with:25569::"lives with their family"} Lives in: {Lives in:25570} Stairs: {opstairs:27293} Has following equipment at home: {Assistive devices:23999}  OCCUPATION: ***  PLOF: {PLOF:24004}  PATIENT GOALS: ***  NEXT MD VISIT: About 4 weeks  OBJECTIVE:   DIAGNOSTIC FINDINGS:  Radiographic exam B/L feet 01/08/2023: Normal osseous mineralization. Joint spaces preserved. No fracture/dislocation/boney destruction. No other soft tissue abnormalities or radiopaque foreign bodies.  Plantar heel spurs noted bilateral  PATIENT SURVEYS:  {rehab surveys:24030}  COGNITION: Overall cognitive status: {cognition:24006}     SENSATION: {sensation:27233}  EDEMA:  {edema:24020}  MUSCLE LENGTH: Hamstrings: Right *** deg; Left *** deg Maisie Fus test: Right *** deg; Left *** deg  POSTURE: {posture:25561}  PALPATION: ***  LOWER EXTREMITY ROM:  {AROM/PROM:27142} ROM Right eval Left eval  Hip flexion    Hip extension    Hip abduction    Hip adduction    Hip internal rotation    Hip external rotation    Knee flexion    Knee extension    Ankle dorsiflexion    Ankle plantarflexion    Ankle inversion    Ankle eversion     (Blank rows = not tested)  LOWER EXTREMITY MMT:  MMT Right eval Left eval  Hip flexion    Hip extension    Hip abduction    Hip adduction    Hip internal rotation    Hip external rotation    Knee flexion    Knee extension    Ankle dorsiflexion    Ankle plantarflexion    Ankle inversion    Ankle eversion     (Blank rows = not tested)  LOWER EXTREMITY SPECIAL TESTS:  {LEspecialtests:26242}  FUNCTIONAL TESTS:  {Functional tests:24029}  GAIT: Distance walked: *** Assistive device utilized: {Assistive devices:23999} Level of assistance: {Levels of assistance:24026} Comments: ***   TODAY'S TREATMENT:                                                                                                                               DATE:  02/14/23 Education    PATIENT EDUCATION:  Education details: POC Person educated: Patient Education method: Explanation Education comprehension: verbalized understanding  HOME EXERCISE PROGRAM: TBD  ASSESSMENT:  CLINICAL IMPRESSION: Patient is a 62 y.o. who was seen today for physical therapy evaluation and treatment for ***.   OBJECTIVE IMPAIRMENTS: Abnormal gait, decreased activity tolerance, decreased balance, decreased mobility, difficulty walking, decreased ROM, decreased strength, impaired flexibility, postural dysfunction, and pain.   ACTIVITY LIMITATIONS: standing, squatting, stairs, and locomotion level  PARTICIPATION LIMITATIONS: meal prep, cleaning, laundry, shopping, community activity, and occupation  PERSONAL FACTORS: Past/current experiences are also affecting patient's functional outcome.  REHAB POTENTIAL: Good  CLINICAL DECISION MAKING: Stable/uncomplicated  EVALUATION COMPLEXITY: Low   GOALS: Goals reviewed with patient? Yes  SHORT TERM GOALS: Target date: 02/28/23 I with initial HEP Baseline: Goal status: INITIAL  LONG TERM GOALS: Target date: ***  I with final HEP Baseline:  Goal status: INITIAL  2.  *** Baseline:  Goal status: {GOALSTATUS:25110}  3.  *** Baseline:  Goal status: {GOALSTATUS:25110}  4.  *** Baseline:  Goal status: {GOALSTATUS:25110}  5.  *** Baseline:  Goal status: {GOALSTATUS:25110}  6.  *** Baseline:  Goal status: {GOALSTATUS:25110}   PLAN:  PT FREQUENCY: {rehab frequency:25116}  PT DURATION: {rehab duration:25117}  PLANNED INTERVENTIONS: {rehab planned interventions:25118::"Therapeutic exercises","Therapeutic activity","Neuromuscular re-education","Balance training","Gait training","Patient/Family education","Self Care","Joint mobilization"}  PLAN FOR NEXT SESSION: ***   Iona Beard, DPT 02/14/2023, 9:15 AM

## 2023-02-15 ENCOUNTER — Ambulatory Visit: Payer: BC Managed Care – PPO | Admitting: Physical Therapy

## 2023-02-15 ENCOUNTER — Encounter: Payer: Self-pay | Admitting: Physical Therapy

## 2023-02-15 DIAGNOSIS — M79671 Pain in right foot: Secondary | ICD-10-CM

## 2023-02-15 DIAGNOSIS — R262 Difficulty in walking, not elsewhere classified: Secondary | ICD-10-CM

## 2023-02-15 DIAGNOSIS — M79672 Pain in left foot: Secondary | ICD-10-CM | POA: Diagnosis present

## 2023-02-15 DIAGNOSIS — R600 Localized edema: Secondary | ICD-10-CM

## 2023-02-15 DIAGNOSIS — R293 Abnormal posture: Secondary | ICD-10-CM | POA: Diagnosis present

## 2023-02-15 DIAGNOSIS — M6281 Muscle weakness (generalized): Secondary | ICD-10-CM | POA: Diagnosis present

## 2023-02-15 NOTE — Therapy (Signed)
OUTPATIENT PHYSICAL THERAPY LOWER EXTREMITY EVALUATION   Patient Name: Teresa Grant MRN: [id number] DOB:18-Sep-1961, 63 y.o., female Today's Date: 02/15/2023  END OF SESSION:  PT End of Session - 02/15/23 1446     Visit Number 1    Date for PT Re-Evaluation 04/26/23    PT Start Time 1443    PT Stop Time 1515    PT Time Calculation (min) 32 min    Activity Tolerance Patient tolerated treatment well    Behavior During Therapy [facility] for tasks assessed/performed             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   Diabetes mellitus    Dysrhythmia    1985   Heart murmur    HLD (hyperlipidemia)    Hypertension    Kidney stone on right side 03/2013   Pneumonia    7 yrs ago   Past Surgical History:  Procedure Laterality Date   ABDOMINAL HYSTERECTOMY  2001   BACK SURGERY     CERVICAL SPINE SURGERY  06/06/2019   Dr. Franky Macho, performed at surgical center   CESAREAN SECTION  1990   COLONOSCOPY     Dr. Arlyce Dice normal   LEFT HEART CATH AND CORONARY ANGIOGRAPHY N/A 11/03/2021   Procedure: LEFT HEART CATH AND CORONARY ANGIOGRAPHY;  Surgeon: Yvonne Kendall, MD;  [facility];  Service: Cardiovascular;  Laterality: N/A;   LUMBAR LAMINECTOMY/DECOMPRESSION MICRODISCECTOMY  03/20/2012   Procedure: LUMBAR LAMINECTOMY/DECOMPRESSION MICRODISCECTOMY;  Surgeon: Emilee Hero, MD;  Location: [facility] OR;  Service: Orthopedics;  Laterality: Left;  Left sided lumbar 4-5 microdisectomy   SPINE SURGERY  2013   Patient Active Problem List   Diagnosis Date Noted   Abdominal bloating 01/08/2023   Chronic idiopathic constipation 01/08/2023   Colon cancer screening 01/08/2023   Other long term (current) drug therapy 01/08/2023   Type 2 diabetes mellitus with hyperglycemia (HCC) 01/08/2023   Aortic atherosclerosis (HCC) 12/18/2022   Pain of right heel 12/18/2022   Stage 3a chronic kidney disease (HCC) 12/18/2022   Vulvar candidiasis 12/18/2022    Coronary artery disease of native artery of native heart with stable angina pectoris (HCC) 04/04/2022   Renal insufficiency 12/12/2021   Class 1 obesity due to excess calories with serious comorbidity and body mass index (BMI) of 30.0 to 30.9 in adult 12/12/2021   Type 2 diabetes mellitus with diabetic polyneuropathy, with long-term current use of insulin (HCC) 06/15/2021   Dyslipidemia 06/15/2021   Uncontrolled type 2 diabetes mellitus with hyperglycemia (HCC) 04/13/2019   Pneumonia due to COVID-19 virus 03/02/2019   Diabetes mellitus type 2 in obese 03/02/2019   Tension headache 11/13/2018   Cervical radiculopathy 11/13/2018   Stable angina (HCC) 07/10/2014   Abdominal pain 07/10/2014   Type II or unspecified type diabetes mellitus without mention of complication, uncontrolled 09/05/2013   Renal calculus, right 06/12/2013   Diabetes with proteinuria 11/05/2012   DDD (degenerative disc disease), lumbar 06/23/2012   Hyperlipidemia 11/27/2006   Essential hypertension 11/27/2006    PCP: Dorothyann Peng, MD   REFERRING PROVIDER: Felecia Shelling, DPM  REFERRING DIAG: M72.2 (ICD-10-CM) - Plantar fasciitis, bilateral   THERAPY DIAG:  Abnormal posture  Localized edema  Difficulty in walking, not elsewhere classified  Muscle weakness (generalized)  Bilateral foot pain  Rationale for Evaluation and Treatment: Rehabilitation  ONSET DATE: 02/05/23  SUBJECTIVE:   SUBJECTIVE STATEMENT: Patient reports severe B foot pain. Lots of walking involved in  her job. She has had the pain for a long time. She finally told her Dr, who sent her to a Podiatrist. She received Cortisone shots and steroid pills. They were largely ineffective. Dr  provided a brace, but it was too painful to wear.  PERTINENT HISTORY: Per referring physician note: 61 y.o. female presenting today for follow-up evaluation of plantar fasciitis bilateral over 1 year now.  Patient works on her feet in [employer]  all day long.  She continues to have pain and tenderness.  She says the injections and the prednisone pack elevated her blood sugars drastically.  She continues to take the meloxicam daily.  She also says that the plantar fascia braces were more bothersome to her feet and cause more pain.  PAIN:  Are you having pain? Yes: NPRS scale: 10/10 Pain location: B feet Pain description: burning, severe Aggravating factors: resting, standing for > 1 hour Relieving factors: Foot massages  PRECAUTIONS: None  WEIGHT BEARING RESTRICTIONS: No  FALLS:  Has patient fallen in last 6 months? No  LIVING ENVIRONMENT: Lives with: lives with their family Lives in: House/apartment Stairs: Yes: Internal: 14 steps; on right going up and External: 5 steps; none, has difficulty on some days due to pain, 1 step at a time. Has following equipment at home: None  OCCUPATION: Catering business, on feet all day  PLOF: Independent  PATIENT GOALS: Pain relief, be able to manage the pain.  NEXT MD VISIT: 3 months  OBJECTIVE:   DIAGNOSTIC FINDINGS:  Radiographic exam B/L feet 01/08/2023: Normal osseous mineralization. Joint spaces preserved. No fracture/dislocation/boney destruction. No other soft tissue abnormalities or radiopaque foreign bodies.  Plantar heel spurs noted bilateral   PATIENT SURVEYS:  FOTO 33.7  COGNITION: Overall cognitive status: Within functional limits for tasks assessed     SENSATION: Patient reports some tingling in R hip and LE  EDEMA:  Mild to mod swelling per patient  MUSCLE LENGTH: Hamstrings: Right 75 deg; Left 60 deg Thomas test: B hip flexors tight and painful beyond neutral.  POSTURE: rounded shoulders, decreased lumbar lordosis, and decreased thoracic kyphosis B pes planus, L > R. Tried to wear orthosis provided by Dr, but too painful. Wears Sketchers with some arch support built in when working.  PALPATION: B feet are TTP on ant calcaneus, R medial gastroc and L  peroneus longus TP proximally  LOWER EXTREMITY ROM: B hips and knees generally WFL, B feet stiff, toe ext limited  Passive ROM Right eval Left eval  Hip flexion    Hip extension    Hip abduction    Hip adduction    Hip internal rotation    Hip external rotation    Knee flexion    Knee extension    Ankle dorsiflexion 7 8  Ankle plantarflexion    Ankle inversion    Ankle eversion     LOWER EXTREMITY MMT: B hips grossly 4-/5, knees 4/5, B ankles 3+, limited by pain.  GAIT: Distance walked: 45' Assistive device utilized: None Level of assistance: Modified independence Comments: Antalgic gait pattern with B LE, slow Wearing slides with inserts, but still very limited arch support   TODAY'S TREATMENT:  DATE:  02/15/23  Education  PATIENT EDUCATION:  Education details: POC Person educated: Patient Education method: Explanation Education comprehension: verbalized understanding  HOME EXERCISE PROGRAM: TBD  ASSESSMENT:  CLINICAL IMPRESSION: Patient is a 62 y.o. who was seen today for physical therapy evaluation and treatment for B severe, chronic PF. She demosntrates some mild weakness and also decreased ROM, especially in her feet and ankles. She will benefit from PT to address her acute pain as well as facilitate improved ROM and strength in BLE and feet in order to decrease stress on PF aspect of each foot. May benefit from additional orthotics or footwear that provides improved arch support.  OBJECTIVE IMPAIRMENTS: Abnormal gait, decreased activity tolerance, difficulty walking, decreased ROM, decreased strength, increased edema, impaired flexibility, postural dysfunction, and pain.   ACTIVITY LIMITATIONS: standing, squatting, stairs, transfers, and locomotion level  PARTICIPATION LIMITATIONS: meal prep, cleaning, laundry, shopping, community  activity, and occupation  PERSONAL FACTORS: Past/current experiences are also affecting patient's functional outcome.   REHAB POTENTIAL: Good  CLINICAL DECISION MAKING: Evolving/moderate complexity  EVALUATION COMPLEXITY: Low   GOALS: Goals reviewed with patient? Yes  SHORT TERM GOALS: Target date: 03/01/23 I with initial HEP Baseline: Goal status: INITIAL LONG TERM GOALS: Target date: 04/26/23  I with final HEP Baseline:  Goal status: INITIAL  2.  Increase B DF PROM to > 20 degrees Baseline: 7R, 8L Goal status: INITIAL  3.  Patient will report 75% improvement in her pain in her feet overall Baseline: 10/10 Goal status: INITIAL  4.  Patient will be able to walk up and down her steps in step over, with U rail, pain < 3/10 Baseline: 10/10 Goal status: INITIAL  5.  Patient will tolerate working a full shift at work of at least 2 hours with B foot pain < 3/10 Baseline: 10/10 Goal status: INITIAL PLAN:  PT FREQUENCY: 1-2x/week  PT DURATION: 10 weeks  PLANNED INTERVENTIONS: Therapeutic exercises, Therapeutic activity, Neuromuscular re-education, Balance training, Gait training, Patient/Family education, Self Care, Joint mobilization, Stair training, Dry Needling, Electrical stimulation, Cryotherapy, Moist heat, Splintting, Taping, Vasopneumatic device, Ultrasound, Ionotophoresis 4mg /ml Dexamethasone, and Manual therapy  PLAN FOR NEXT SESSION: HEP  Iona Beard, DPT 02/15/2023, 4:28 PM

## 2023-02-16 ENCOUNTER — Encounter: Payer: Self-pay | Admitting: Internal Medicine

## 2023-02-16 ENCOUNTER — Ambulatory Visit (INDEPENDENT_AMBULATORY_CARE_PROVIDER_SITE_OTHER): Payer: BC Managed Care – PPO | Admitting: Internal Medicine

## 2023-02-16 VITALS — BP 124/80 | HR 79 | Ht 61.0 in | Wt 163.4 lb

## 2023-02-16 DIAGNOSIS — E1165 Type 2 diabetes mellitus with hyperglycemia: Secondary | ICD-10-CM

## 2023-02-16 DIAGNOSIS — Z794 Long term (current) use of insulin: Secondary | ICD-10-CM | POA: Diagnosis not present

## 2023-02-16 DIAGNOSIS — E785 Hyperlipidemia, unspecified: Secondary | ICD-10-CM

## 2023-02-16 DIAGNOSIS — E1142 Type 2 diabetes mellitus with diabetic polyneuropathy: Secondary | ICD-10-CM | POA: Diagnosis not present

## 2023-02-16 LAB — POCT GLYCOSYLATED HEMOGLOBIN (HGB A1C): Hemoglobin A1C: 11.4 % — AB (ref 4.0–5.6)

## 2023-02-16 MED ORDER — TRESIBA FLEXTOUCH 200 UNIT/ML ~~LOC~~ SOPN: 28.0000 [IU] | PEN_INJECTOR | Freq: Every day | SUBCUTANEOUS | 3 refills | Status: DC

## 2023-02-16 MED ORDER — DAPAGLIFLOZIN PRO-METFORMIN ER 5-1000 MG PO TB24: 2.0000 | ORAL_TABLET | Freq: Every day | ORAL | 3 refills | Status: DC

## 2023-02-16 MED ORDER — GLIMEPIRIDE 1 MG PO TABS: 1.0000 mg | ORAL_TABLET | Freq: Every day | ORAL | 3 refills | Status: DC

## 2023-02-16 MED ORDER — INSULIN PEN NEEDLE 32G X 4 MM MISC: 1.0000 | Freq: Every day | 3 refills | Status: DC

## 2023-02-16 NOTE — Patient Instructions (Addendum)
Start Glimepiride 1 mg, 1 tablet before Breakfast  Continue Xigduo 01-999 mg, 2 tablets at night   Decrease  Tresiba 28 units daily    HOW TO TREAT LOW BLOOD SUGARS (Blood sugar LESS THAN 70 MG/DL) Please follow the RULE OF 15 for the treatment of hypoglycemia treatment (when your (blood sugars are less than 70 mg/dL)   STEP 1: Take 15 grams of carbohydrates when your blood sugar is low, which includes:  3-4 GLUCOSE TABS  OR 3-4 OZ OF JUICE OR REGULAR SODA OR ONE TUBE OF GLUCOSE GEL    STEP 2: RECHECK blood sugar in 15 MINUTES STEP 3: If your blood sugar is still low at the 15 minute recheck --> then, go back to STEP 1 and treat AGAIN with another 15 grams of carbohydrates.

## 2023-02-16 NOTE — Progress Notes (Signed)
Name: Teresa Grant  MRN/ DOB: [id number], 1960/10/09   Age/ Sex: 62 y.o., female    PCP: Dorothyann Peng, MD   Reason for Endocrinology Evaluation: Type 2 Diabetes Mellitus     Date of Initial Endocrinology Visit: 06/15/2021    PATIENT IDENTIFIER: Teresa Grant is a 62 y.o. female with a past medical history of T2DM, HTN and Dyslipidemia . The patient presented for initial endocrinology clinic visit on 06/15/2021 for consultative assistance with her diabetes management.    HPI: [name] was    Diagnosed with DM 2008 Prior Medications tried/Intolerance: Glipizide , januvia  Hemoglobin A1c has ranged from 6.8% in 2020, peaking at 11.2% in 2022.   On her initial visit to our clinic she had an A1c of 8.5%, we continued Xigduo, increase Ozempic, and decrease basal insulin   Stop Ozempic 07/2022 due to hx of pancreatitis > 20 yrs ago , presenting to ED 06/2022 with N/V and slight elevation in lipase  SUBJECTIVE:   During the last visit (08/15/2022): A1c was 6.4 %     Today (02/16/23): Ms. Konicek is here for follow-up on diabetes management.  She checks her blood sugars 1x daily . The patient has had hypoglycemic episodes since the last clinic visit.   Patient presented to the ED 01/15/2023 for hyperglycemia with serum glucose 592 mg/DL   She received intra-articular injection and prednisone pak 01/07/2023 through podiatry   She is on Ryebelsus through PCP so far tolerating well , but has hx of pancreatitis   She eats once meal a day   HOME DIABETES REGIMEN: Xigduo 01-999 mg twice daily  Tresiba 35 units daily      Statin: yes ACE-I/ARB: Intolerant to Lisinopril due to cough     METER DOWNLOAD SUMMARY:   [id number]   DIABETIC COMPLICATIONS: Microvascular complications:   Denies: CKD, retinopathy, neuropathy  Last eye exam: Completed 01/24/2023  Macrovascular complications:   Denies: CAD, PVD, CVA   PAST HISTORY: Past Medical History:  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   Diabetes mellitus    Dysrhythmia    1985   Heart murmur    HLD (hyperlipidemia)    Hypertension    Kidney stone on right side 03/2013   Pneumonia    7 yrs ago   Past Surgical History:  Past Surgical History:  Procedure Laterality Date   ABDOMINAL HYSTERECTOMY  2001   BACK SURGERY     CERVICAL SPINE SURGERY  06/06/2019   Dr. Franky Macho, performed at surgical center   CESAREAN SECTION  1990   COLONOSCOPY     Dr. Arlyce Dice normal   LEFT HEART CATH AND CORONARY ANGIOGRAPHY N/A 11/03/2021   Procedure: LEFT HEART CATH AND CORONARY ANGIOGRAPHY;  Surgeon: Yvonne Kendall, MD;  Location: [facility];  Service: Cardiovascular;  Laterality: N/A;   LUMBAR LAMINECTOMY/DECOMPRESSION MICRODISCECTOMY  03/20/2012   Procedure: LUMBAR LAMINECTOMY/DECOMPRESSION MICRODISCECTOMY;  Surgeon: Emilee Hero, MD;  Location: [facility] OR;  Service: Orthopedics;  Laterality: Left;  Left sided lumbar 4-5 microdisectomy   SPINE SURGERY  2013    Social History:  reports that she quit smoking about 18 years ago. Her smoking use included cigarettes. She has a 12.50 pack-year smoking history. She has never used smokeless tobacco. She reports current alcohol use. She reports that she does not use drugs. Family History:  Family History  Problem Relation Age of Onset   Hypertension Mother    Stroke Mother  Heart attack Mother 26   Heart disease Mother    Hypertension Father    Kidney disease Father    Hypertension Sister    Hypertension Maternal Aunt    Hypertension Maternal Uncle    Esophageal cancer Maternal Uncle    Diabetes Paternal Aunt    Diabetes Paternal Uncle    Colon cancer Cousin    Hypertension Brother    Cancer Neg Hx    Colon polyps Neg Hx    Rectal cancer Neg Hx    Stomach cancer Neg Hx    Breast cancer Neg Hx      HOME MEDICATIONS: Allergies as of 02/16/2023       Reactions   Lisinopril Cough   Atorvastatin  Other (See Comments)   Glipizide Other (See Comments)   Hydrochlorothiazide Other (See Comments)   pancreatitis        Medication List        Accurate as of Feb 16, 2023  2:49 PM. If you have any questions, ask your nurse or doctor.          STOP taking these medications    fluconazole 100 MG tablet Commonly known as: Diflucan Stopped by: Scarlette Shorts, MD   methylPREDNISolone 4 MG Tbpk tablet Commonly known as: MEDROL DOSEPAK Stopped by: Scarlette Shorts, MD       TAKE these medications    amLODipine 10 MG tablet Commonly known as: NORVASC TAKE 1 TABLET BY MOUTH DAILY   aspirin EC 81 MG tablet Take 1 tablet (81 mg total) by mouth daily. Swallow whole.   atorvastatin 40 MG tablet Commonly known as: Lipitor Take 1 tablet (40 mg total) by mouth daily. Take 1 tablet daily, skip Sundays.   BC Fast Pain Relief 650-195-33.3 MG Pack Generic drug: Aspirin-Salicylamide-Caffeine Take 1 packet by mouth daily as needed (pain.).   Dapagliflozin Pro-metFORMIN ER 01-999 MG Tb24 Commonly known as: Xigduo XR Take 2 tablets by mouth daily in the afternoon.   famotidine 20 MG tablet Commonly known as: PEPCID Take 1 tablet (20 mg total) by mouth 2 (two) times daily.   gabapentin 100 MG capsule Commonly known as: NEURONTIN TAKE ONE CAPSULE BY MOUTH THREE TIMES A DAY What changed:  how to take this when to take this   Insulin Pen Needle 32G X 4 MM Misc 1 Device by Does not apply route daily in the afternoon.   Linzess 290 MCG Caps capsule Generic drug: linaclotide Take 290 mcg by mouth daily.   meloxicam 15 MG tablet Commonly known as: MOBIC Take 1 tablet (15 mg total) by mouth daily.   nitroGLYCERIN 0.4 MG SL tablet Commonly known as: NITROSTAT Place 1 tablet (0.4 mg total) under the tongue every 5 (five) minutes as needed for chest pain. UP TO 3 TABLETS   ondansetron 4 MG tablet Commonly known as: ZOFRAN Take 1 tablet (4 mg total) by mouth every  8 (eight) hours as needed for nausea or vomiting.   OneTouch Verio test strip Generic drug: glucose blood Use as directed to check blood sugars 2 times per day dx: e11.65   telmisartan 20 MG tablet Commonly known as: MICARDIS TAKE ONE TABLET BY MOUTH EVERY EVENING   Tresiba FlexTouch 200 UNIT/ML FlexTouch Pen Generic drug: insulin degludec Inject 22 Units into the skin at bedtime. What changed: how much to take   VITAMIN C PO Take 500 mg by mouth every evening.   Vitamin D (Ergocalciferol) 1.25 MG (50000 UNIT) Caps capsule Commonly  known as: DRISDOL TAKE 1 CAPSULE BY MOUTH ONCE WEEKLY   ZINCATE PO Take 50 mg by mouth every evening.         ALLERGIES: Allergies  Allergen Reactions   Lisinopril Cough   Atorvastatin Other (See Comments)   Glipizide Other (See Comments)   Hydrochlorothiazide Other (See Comments)    pancreatitis     REVIEW OF SYSTEMS: A comprehensive ROS was conducted with the patient and is negative except as per HPI     OBJECTIVE:   VITAL SIGNS:BP 124/80 (BP Location: Left Arm, Patient Position: Sitting, Cuff Size: Small)   Pulse 79   Ht 5\' 1"  (1.549 m)   Wt 163 lb 6.4 oz (74.1 kg)   SpO2 99%   BMI 30.87 kg/m    PHYSICAL EXAM:  General: Pt appears well and is in NAD  Lungs: Clear with good BS bilat with no rales, rhonchi, or wheezes  Heart: RRR   Abdomen: soft, nontender, without masses or organomegaly palpable  Extremities:  Lower extremities - No pretibial edema. No lesions.  Neuro: MS is good with appropriate affect, pt is alert and Ox3    DM foot exam:01/07/2023 per podiatry   The skin of the feet is intact without sores or ulcerations. The pedal pulses are 2+ on right and 2+ on left. The sensation is decreased  to a screening 5.07, 10 gram monofilament bilaterally   DATA REVIEWED:  Lab Results  Component Value Date   HGBA1C 12.9 (H) 12/18/2022   HGBA1C 6.4 (A) 08/15/2022   HGBA1C 6.2 (H) 12/12/2021    Latest Reference  Range & Units 07/19/22 13:00  COMPREHENSIVE METABOLIC PANEL  Rpt !  Sodium 135 - 145 mmol/L 139  Potassium 3.5 - 5.1 mmol/L 4.5  Chloride 98 - 111 mmol/L 104  CO2 22 - 32 mmol/L 22  Glucose 70 - 99 mg/dL 161 (H)  BUN 8 - 23 mg/dL 18  Creatinine 0.96 - 0.45 mg/dL 4.09 (H)  Calcium 8.9 - 10.3 mg/dL 9.9  Anion gap 5 - 15  13  Alkaline Phosphatase 38 - 126 U/L 127 (H)  Albumin 3.5 - 5.0 g/dL 4.5  Lipase 11 - 51 U/L 64 (H)  AST 15 - 41 U/L 22  ALT 0 - 44 U/L 11  Total Protein 6.5 - 8.1 g/dL 7.9  Total Bilirubin 0.3 - 1.2 mg/dL 0.5  GFR, Estimated >81 mL/min 48 (L)      ASSESSMENT / PLAN / RECOMMENDATIONS:   1) Type 2 Diabetes Mellitus, poorly  controlled, With Neuropathic complications - Most recent A1c of 11.4 %. Goal A1c < 7.0 %.    -Her A1c has increased from 6.4%, to 12.9% approximately 2 months ago -Today it is still elevated 11.4% -I suspect dietary indiscretion, as well as discontinuation of GLP-1 agonist, I encouraged the patient to check glucose on a regular basis -She also has received glucocorticoids -She was on glipizide in the past, which was discontinued due to hypoglycemia, I did explain to the patient sulfonylureas are associated with hypoglycemia, I will put her on glimepiride, she was advised to take it 15-20 minutes before the first meal of the day.  I did explain to the patient that if she takes glimepiride and not eat within 30 minutes she will have hypoglycemia -I will reduce Evaristo Bury as below , patient with fasting hypoglycemia -I stopped Ozempic due to history of pancreatitis, she also had an episode with elevated lipase associated nausea and vomiting in 2023.  Patient is  on Rybelsus to PCP.  The patient does admit that her GI symptoms improved with discontinuation of Ozempic.  I did explain to the patient the risk of pancreatitis with GLP-1 agonist.  She will discontinue Rybelsus   MEDICATIONS:   Start glimepiride 1 mg daily before breakfast Continue Xigduo  01-999 mg 2 tabs at night Decrease Tresiba to 28 units daily   EDUCATION / INSTRUCTIONS: BG monitoring instructions: Patient is instructed to check her blood sugars 1 times a day, fasting . Call [facility] if: BG persistently < 70  I reviewed the Rule of 15 for the treatment of hypoglycemia in detail with the patient. Literature supplied.   2) Diabetic complications:  Eye: Does not have known diabetic retinopathy.  Neuro/ Feet: Does  have known diabetic peripheral neuropathy based on exam 9/21/202 Renal: Patient does not have known baseline CKD. She is  on an ACEI/ARB at present.  3) Dyslipidemia:    - LDL was above goal at 100 mg/dL in 1610, We switched pravastatin to  Atorvastatin  -LDL has been at goal   Medication   Continue  Atorvastatin 40 mg daily     F/U in 3 months    Signed electronically by: Lyndle Herrlich, MD  [facility] Endocrinology  [facility] [address] [place], [place] [address] Phone: [phone] FAX: [phone]   CC: Dorothyann Peng, MD [address] [place] [address] Phone: [phone]  Fax: [phone]    Return to Endocrinology clinic as below: Future Appointments  Date Time Provider Department Center  02/22/2023  8:45 AM Payseur, Marzella Schlein, PTA [facility]  02/27/2023  9:30 AM Payseur, Marzella Schlein, PTA [facility]  03/01/2023  9:30 AM Payseur, Marzella Schlein, PTA [facility]  03/02/2023  9:30 AM Payseur, Marzella Schlein, PTA [facility]  03/08/2023  4:00 PM Dorothyann Peng, MD [facility] None  04/09/2023  8:15 AM Felecia Shelling, DPM [facility]  12/25/2023  2:00 PM Dorothyann Peng, MD [facility] None

## 2023-02-22 ENCOUNTER — Ambulatory Visit: Payer: BC Managed Care – PPO | Admitting: Physical Therapy

## 2023-02-27 ENCOUNTER — Ambulatory Visit: Payer: BC Managed Care – PPO | Attending: Podiatry | Admitting: Physical Therapy

## 2023-02-27 DIAGNOSIS — M79672 Pain in left foot: Secondary | ICD-10-CM | POA: Insufficient documentation

## 2023-02-27 DIAGNOSIS — R293 Abnormal posture: Secondary | ICD-10-CM | POA: Insufficient documentation

## 2023-02-27 DIAGNOSIS — M79671 Pain in right foot: Secondary | ICD-10-CM | POA: Insufficient documentation

## 2023-02-27 DIAGNOSIS — M6281 Muscle weakness (generalized): Secondary | ICD-10-CM | POA: Diagnosis present

## 2023-02-27 DIAGNOSIS — R600 Localized edema: Secondary | ICD-10-CM | POA: Insufficient documentation

## 2023-02-27 DIAGNOSIS — R262 Difficulty in walking, not elsewhere classified: Secondary | ICD-10-CM | POA: Insufficient documentation

## 2023-02-27 NOTE — Therapy (Signed)
OUTPATIENT PHYSICAL THERAPY LOWER EXTREMITY    Patient Name: Teresa Grant MRN: [id number] DOB:1961-06-17, 62 y.o., female Today's Date: 02/27/2023  END OF SESSION:  PT End of Session - 02/27/23 0915     Visit Number 2    Date for PT Re-Evaluation 04/26/23    PT Start Time 0915    PT Stop Time 1000    PT Time Calculation (min) 45 min             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   Diabetes mellitus    Dysrhythmia    1985   Heart murmur    HLD (hyperlipidemia)    Hypertension    Kidney stone on right side 03/2013   Pneumonia    7 yrs ago   Past Surgical History:  Procedure Laterality Date   ABDOMINAL HYSTERECTOMY  2001   BACK SURGERY     CERVICAL SPINE SURGERY  06/06/2019   Dr. Franky Macho, performed at surgical center   CESAREAN SECTION  1990   COLONOSCOPY     Dr. Arlyce Dice normal   LEFT HEART CATH AND CORONARY ANGIOGRAPHY N/A 11/03/2021   Procedure: LEFT HEART CATH AND CORONARY ANGIOGRAPHY;  Surgeon: Yvonne Kendall, MD;  Location: [facility];  Service: Cardiovascular;  Laterality: N/A;   LUMBAR LAMINECTOMY/DECOMPRESSION MICRODISCECTOMY  03/20/2012   Procedure: LUMBAR LAMINECTOMY/DECOMPRESSION MICRODISCECTOMY;  Surgeon: Emilee Hero, MD;  Location: [facility] OR;  Service: Orthopedics;  Laterality: Left;  Left sided lumbar 4-5 microdisectomy   SPINE SURGERY  2013   Patient Active Problem List   Diagnosis Date Noted   Abdominal bloating 01/08/2023   Chronic idiopathic constipation 01/08/2023   Colon cancer screening 01/08/2023   Other long term (current) drug therapy 01/08/2023   Type 2 diabetes mellitus with hyperglycemia (HCC) 01/08/2023   Aortic atherosclerosis (HCC) 12/18/2022   Pain of right heel 12/18/2022   Stage 3a chronic kidney disease (HCC) 12/18/2022   Vulvar candidiasis 12/18/2022   Coronary artery disease of native artery of native heart with stable angina pectoris (HCC) 04/04/2022   Renal insufficiency  12/12/2021   Class 1 obesity due to excess calories with serious comorbidity and body mass index (BMI) of 30.0 to 30.9 in adult 12/12/2021   Type 2 diabetes mellitus with diabetic polyneuropathy, with long-term current use of insulin (HCC) 06/15/2021   Dyslipidemia 06/15/2021   Uncontrolled type 2 diabetes mellitus with hyperglycemia (HCC) 04/13/2019   Pneumonia due to COVID-19 virus 03/02/2019   Diabetes mellitus type 2 in obese 03/02/2019   Tension headache 11/13/2018   Cervical radiculopathy 11/13/2018   Stable angina (HCC) 07/10/2014   Abdominal pain 07/10/2014   Type II or unspecified type diabetes mellitus without mention of complication, uncontrolled 09/05/2013   Renal calculus, right 06/12/2013   Diabetes with proteinuria 11/05/2012   DDD (degenerative disc disease), lumbar 06/23/2012   Hyperlipidemia 11/27/2006   Essential hypertension 11/27/2006    PCP: Dorothyann Peng, MD   REFERRING PROVIDER: Felecia Shelling, DPM  REFERRING DIAG: M72.2 (ICD-10-CM) - Plantar fasciitis, bilateral   THERAPY DIAG:  Difficulty in walking, not elsewhere classified  Bilateral foot pain  Muscle weakness (generalized)  Rationale for Evaluation and Treatment: Rehabilitation  ONSET DATE: 02/05/23  SUBJECTIVE:   SUBJECTIVE STATEMENT:very painful. Shot helped for a couple days but landed me in ER with blood suger PERTINENT HISTORY: Per referring physician note: 62 y.o. female presenting today for follow-up evaluation of plantar fasciitis bilateral over 1 year  now.  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  She continues to have pain and tenderness.  She says the injections and the prednisone pack elevated her blood sugars drastically.  She continues to take the meloxicam daily.  She also says that the plantar fascia braces were more bothersome to her feet and cause more pain.  PAIN:  Are you having pain? Yes: NPRS scale: 10/10 Pain location: B feet Pain description:  burning, severe Aggravating factors: resting, standing for > 1 hour Relieving factors: Foot massages  PRECAUTIONS: None  WEIGHT BEARING RESTRICTIONS: No  FALLS:  Has patient fallen in last 6 months? No  LIVING ENVIRONMENT: Lives with: lives with their family Lives in: House/apartment Stairs: Yes: Internal: 14 steps; on right going up and External: 5 steps; none, has difficulty on some days due to pain, 1 step at a time. Has following equipment at home: None  OCCUPATION: Catering business, on feet all day  PLOF: Independent  PATIENT GOALS: Pain relief, be able to manage the pain.  NEXT MD VISIT: 3 months  OBJECTIVE:   DIAGNOSTIC FINDINGS:  Radiographic exam B/L feet 01/08/2023: Normal osseous mineralization. Joint spaces preserved. No fracture/dislocation/boney destruction. No other soft tissue abnormalities or radiopaque foreign bodies.  Plantar heel spurs noted bilateral   PATIENT SURVEYS:  FOTO 33.7  COGNITION: Overall cognitive status: Within functional limits for tasks assessed     SENSATION: Patient reports some tingling in R hip and LE  EDEMA:  Mild to mod swelling per patient  MUSCLE LENGTH: Hamstrings: Right 75 deg; Left 60 deg Thomas test: B hip flexors tight and painful beyond neutral.  POSTURE: rounded shoulders, decreased lumbar lordosis, and decreased thoracic kyphosis B pes planus, L > R. Tried to wear orthosis provided by Dr, but too painful. Wears Sketchers with some arch support built in when working.  PALPATION: B feet are TTP on ant calcaneus, R medial gastroc and L peroneus longus TP proximally  LOWER EXTREMITY ROM: B hips and knees generally WFL, B feet stiff, toe ext limited  Passive ROM Right eval Left eval  Hip flexion    Hip extension    Hip abduction    Hip adduction    Hip internal rotation    Hip external rotation    Knee flexion    Knee extension    Ankle dorsiflexion 7 8  Ankle plantarflexion    Ankle inversion    Ankle  eversion     LOWER EXTREMITY MMT: B hips grossly 4-/5, knees 4/5, B ankles 3+, limited by pain.  GAIT: Distance walked: 38' Assistive device utilized: None Level of assistance: Modified independence Comments: Antalgic gait pattern with B LE, slow Wearing slides with inserts, but still very limited arch support   TODAY'S TREATMENT:  DATE:   02/27/23 [place] BIL PF on a stretch with firm pressure 6 min each foot DTW BIL PF Stretching passively BIL PF DN RT PF by MAlbright PT Gave info from [employer] for PF sleeve to wear  02/15/23  Education  PATIENT EDUCATION:  Education details: POC Person educated: Patient Education method: Explanation Education comprehension: verbalized understanding  HOME EXERCISE PROGRAM: TBD  ASSESSMENT:  CLINICAL IMPRESSION: pt arrives with no changes form eval. 10/10 pain and states brace form MD is hard to wear. So gave info for smaller sleeve to try esp when working as she works in catering. US,STW and DN to help pain OBJECTIVE IMPAIRMENTS: Abnormal gait, decreased activity tolerance, difficulty walking, decreased ROM, decreased strength, increased edema, impaired flexibility, postural dysfunction, and pain.   ACTIVITY LIMITATIONS: standing, squatting, stairs, transfers, and locomotion level  PARTICIPATION LIMITATIONS: meal prep, cleaning, laundry, shopping, community activity, and occupation  PERSONAL FACTORS: Past/current experiences are also affecting patient's functional outcome.   REHAB POTENTIAL: Good  CLINICAL DECISION MAKING: Evolving/moderate complexity  EVALUATION COMPLEXITY: Low   GOALS: Goals reviewed with patient? Yes  SHORT TERM GOALS: Target date: 03/01/23 I with initial HEP Baseline: Goal status: INITIAL LONG TERM GOALS: Target date: 04/26/23  I with final HEP Baseline:  Goal status: INITIAL  2.  Increase  B DF PROM to > 20 degrees Baseline: 7R, 8L Goal status: INITIAL  3.  Patient will report 75% improvement in her pain in her feet overall Baseline: 10/10 Goal status: INITIAL  4.  Patient will be able to walk up and down her steps in step over, with U rail, pain < 3/10 Baseline: 10/10 Goal status: INITIAL  5.  Patient will tolerate working a full shift at work of at least 2 hours with B foot pain < 3/10 Baseline: 10/10 Goal status: INITIAL PLAN:  PT FREQUENCY: 1-2x/week  PT DURATION: 10 weeks  PLANNED INTERVENTIONS: Therapeutic exercises, Therapeutic activity, Neuromuscular re-education, Balance training, Gait training, Patient/Family education, Self Care, Joint mobilization, Stair training, Dry Needling, Electrical stimulation, Cryotherapy, Moist heat, Splintting, Taping, Vasopneumatic device, Ultrasound, Ionotophoresis 4mg /ml Dexamethasone, and Manual therapy  PLAN FOR NEXT SESSION: HEP,assess today session and progress    Iysis Germain,ANGIE, PTA 02/27/2023, 9:15 AM  [facility] at [facility] [address] [facility]. [place], [place], [address] Phone: [phone]   Fax:  [phone]

## 2023-03-01 ENCOUNTER — Ambulatory Visit: Payer: BC Managed Care – PPO | Admitting: Physical Therapy

## 2023-03-01 NOTE — Therapy (Signed)
OUTPATIENT PHYSICAL THERAPY LOWER EXTREMITY    Patient Name: Teresa Grant MRN: [id number] DOB:[birth year], 62 y.o., female Today's Date: 03/02/2023  END OF SESSION:  PT End of Session - 03/02/23 0841     Visit Number 3    Date for PT Re-Evaluation 04/26/23    PT Start Time 0841    PT Stop Time 0925    PT Time Calculation (min) 44 min              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   Diabetes mellitus    Dysrhythmia    1985   Heart murmur    HLD (hyperlipidemia)    Hypertension    Kidney stone on right side 03/2013   Pneumonia    7 yrs ago   Past Surgical History:  Procedure Laterality Date   ABDOMINAL HYSTERECTOMY  2001   BACK SURGERY     CERVICAL SPINE SURGERY  06/06/2019   Dr. Franky Macho, performed at surgical center   CESAREAN SECTION  1990   COLONOSCOPY     Dr. Arlyce Dice normal   LEFT HEART CATH AND CORONARY ANGIOGRAPHY N/A 11/03/2021   Procedure: LEFT HEART CATH AND CORONARY ANGIOGRAPHY;  Surgeon: Yvonne Kendall, MD;  Location: [facility];  Service: Cardiovascular;  Laterality: N/A;   LUMBAR LAMINECTOMY/DECOMPRESSION MICRODISCECTOMY  03/20/2012   Procedure: LUMBAR LAMINECTOMY/DECOMPRESSION MICRODISCECTOMY;  Surgeon: Emilee Hero, MD;  Location: [facility] OR;  Service: Orthopedics;  Laterality: Left;  Left sided lumbar 4-5 microdisectomy   SPINE SURGERY  2013   Patient Active Problem List   Diagnosis Date Noted   Abdominal bloating 01/08/2023   Chronic idiopathic constipation 01/08/2023   Colon cancer screening 01/08/2023   Other long term (current) drug therapy 01/08/2023   Type 2 diabetes mellitus with hyperglycemia (HCC) 01/08/2023   Aortic atherosclerosis (HCC) 12/18/2022   Pain of right heel 12/18/2022   Stage 3a chronic kidney disease (HCC) 12/18/2022   Vulvar candidiasis 12/18/2022   Coronary artery disease of native artery of native heart with stable angina pectoris (HCC) 04/04/2022   Renal  insufficiency 12/12/2021   Class 1 obesity due to excess calories with serious comorbidity and body mass index (BMI) of 30.0 to 30.9 in adult 12/12/2021   Type 2 diabetes mellitus with diabetic polyneuropathy, with long-term current use of insulin (HCC) 06/15/2021   Dyslipidemia 06/15/2021   Uncontrolled type 2 diabetes mellitus with hyperglycemia (HCC) 04/13/2019   Pneumonia due to COVID-19 virus 03/02/2019   Diabetes mellitus type 2 in obese 03/02/2019   Tension headache 11/13/2018   Cervical radiculopathy 11/13/2018   Stable angina (HCC) 07/10/2014   Abdominal pain 07/10/2014   Type II or unspecified type diabetes mellitus without mention of complication, uncontrolled 09/05/2013   Renal calculus, right 06/12/2013   Diabetes with proteinuria 11/05/2012   DDD (degenerative disc disease), lumbar 06/23/2012   Hyperlipidemia 11/27/2006   Essential hypertension 11/27/2006    PCP: Dorothyann Peng, MD   REFERRING PROVIDER: Felecia Shelling, DPM  REFERRING DIAG: M72.2 (ICD-10-CM) - Plantar fasciitis, bilateral   THERAPY DIAG:  Difficulty in walking, not elsewhere classified  Bilateral foot pain  Muscle weakness (generalized)  Rationale for Evaluation and Treatment: Rehabilitation  ONSET DATE: 02/05/23  SUBJECTIVE:   SUBJECTIVE STATEMENT: my feet still hurt, its an 8. I don't think any helped last time.    PERTINENT HISTORY: Per referring physician note: 62 y.o. female presenting today for follow-up evaluation of plantar fasciitis bilateral  over 1 year now.  Patient works on her feet in [employer] all day long.  She continues to have pain and tenderness.  She says the injections and the prednisone pack elevated her blood sugars drastically.  She continues to take the meloxicam daily.  She also says that the plantar fascia braces were more bothersome to her feet and cause more pain.   PAIN:  Are you having pain? Yes: NPRS scale: 8/10 Pain location: B feet Pain  description: burning, severe Aggravating factors: resting, standing for > 1 hour Relieving factors: Foot massages  PRECAUTIONS: None  WEIGHT BEARING RESTRICTIONS: No  FALLS:  Has patient fallen in last 6 months? No  LIVING ENVIRONMENT: Lives with: lives with their family Lives in: House/apartment Stairs: Yes: Internal: 14 steps; on right going up and External: 5 steps; none, has difficulty on some days due to pain, 1 step at a time. Has following equipment at home: None  OCCUPATION: Catering business, on feet all day  PLOF: Independent  PATIENT GOALS: Pain relief, be able to manage the pain.  NEXT MD VISIT: 3 months  OBJECTIVE:   DIAGNOSTIC FINDINGS:  Radiographic exam B/L feet 01/08/2023: Normal osseous mineralization. Joint spaces preserved. No fracture/dislocation/boney destruction. No other soft tissue abnormalities or radiopaque foreign bodies.  Plantar heel spurs noted bilateral   PATIENT SURVEYS:  FOTO 33.7  COGNITION: Overall cognitive status: Within functional limits for tasks assessed     SENSATION: Patient reports some tingling in R hip and LE  EDEMA:  Mild to mod swelling per patient  MUSCLE LENGTH: Hamstrings: Right 75 deg; Left 60 deg Thomas test: B hip flexors tight and painful beyond neutral.  POSTURE: rounded shoulders, decreased lumbar lordosis, and decreased thoracic kyphosis B pes planus, L > R. Tried to wear orthosis provided by Dr, but too painful. Wears Sketchers with some arch support built in when working.  PALPATION: B feet are TTP on ant calcaneus, R medial gastroc and L peroneus longus TP proximally  LOWER EXTREMITY ROM: B hips and knees generally WFL, B feet stiff, toe ext limited  Passive ROM Right eval Left eval  Hip flexion    Hip extension    Hip abduction    Hip adduction    Hip internal rotation    Hip external rotation    Knee flexion    Knee extension    Ankle dorsiflexion 7 8  Ankle plantarflexion    Ankle  inversion    Ankle eversion     LOWER EXTREMITY MMT: B hips grossly 4-/5, knees 4/5, B ankles 3+, limited by pain.  GAIT: Distance walked: 42' Assistive device utilized: None Level of assistance: Modified independence Comments: Antalgic gait pattern with B LE, slow Wearing slides with inserts, but still very limited arch support   TODAY'S TREATMENT:  DATE:  03/02/23 STM to BIL feet  Passive BIL stretch [place] BIL PF on a stretch with firm pressure 6 min each foot Towel scrunches 2x10 SLS 30s Tandem standing 30s  Ankle TB DF/PF green band x10  Calf stretch 30s Calf raises 2x10    02/27/23 [place] BIL PF on a stretch with firm pressure 6 min each foot DTW BIL PF Stretching passively BIL PF DN RT PF by MAlbright PT Gave info from [employer] for PF sleeve to wear   02/15/23  Education  PATIENT EDUCATION:  Education details: POC Person educated: Patient Education method: Explanation Education comprehension: verbalized understanding  HOME EXERCISE PROGRAM: TBD  ASSESSMENT:  CLINICAL IMPRESSION: pt arrives with slight changes since last visit. Reports they feel a little bit better today.  Started with some STM to BIL feet and was able to break up some of the fascia. More tightness felt in the L foot. Did [place] again today and ended will some intrinsic foot and ankle exercises.    OBJECTIVE IMPAIRMENTS: Abnormal gait, decreased activity tolerance, difficulty walking, decreased ROM, decreased strength, increased edema, impaired flexibility, postural dysfunction, and pain.   ACTIVITY LIMITATIONS: standing, squatting, stairs, transfers, and locomotion level  PARTICIPATION LIMITATIONS: meal prep, cleaning, laundry, shopping, community activity, and occupation  PERSONAL FACTORS: Past/current experiences are also affecting patient's functional outcome.   REHAB  POTENTIAL: Good  CLINICAL DECISION MAKING: Evolving/moderate complexity  EVALUATION COMPLEXITY: Low   GOALS: Goals reviewed with patient? Yes  SHORT TERM GOALS: Target date: 03/01/23 I with initial HEP Baseline: Goal status: INITIAL LONG TERM GOALS: Target date: 04/26/23  I with final HEP Baseline:  Goal status: INITIAL  2.  Increase B DF PROM to > 20 degrees Baseline: 7R, 8L Goal status: INITIAL  3.  Patient will report 75% improvement in her pain in her feet overall Baseline: 10/10 Goal status: INITIAL  4.  Patient will be able to walk up and down her steps in step over, with U rail, pain < 3/10 Baseline: 10/10 Goal status: INITIAL  5.  Patient will tolerate working a full shift at work of at least 2 hours with B foot pain < 3/10 Baseline: 10/10 Goal status: INITIAL PLAN:  PT FREQUENCY: 1-2x/week  PT DURATION: 10 weeks  PLANNED INTERVENTIONS: Therapeutic exercises, Therapeutic activity, Neuromuscular re-education, Balance training, Gait training, Patient/Family education, Self Care, Joint mobilization, Stair training, Dry Needling, Electrical stimulation, Cryotherapy, Moist heat, Splintting, Taping, Vasopneumatic device, Ultrasound, Ionotophoresis 4mg /ml Dexamethasone, and Manual therapy  PLAN FOR NEXT SESSION: HEP,assess today session and progress    Cassie Freer, PT 03/02/2023, 9:31 AM  [facility] at [facility] [address] [facility]. [place], [place], [address] Phone: [phone]   Fax:  [phone]

## 2023-03-02 ENCOUNTER — Ambulatory Visit: Payer: BC Managed Care – PPO

## 2023-03-02 DIAGNOSIS — R262 Difficulty in walking, not elsewhere classified: Secondary | ICD-10-CM | POA: Diagnosis not present

## 2023-03-02 DIAGNOSIS — M6281 Muscle weakness (generalized): Secondary | ICD-10-CM

## 2023-03-02 DIAGNOSIS — M79671 Pain in right foot: Secondary | ICD-10-CM

## 2023-03-08 ENCOUNTER — Ambulatory Visit: Payer: BC Managed Care – PPO | Admitting: Internal Medicine

## 2023-03-08 ENCOUNTER — Encounter: Payer: Self-pay | Admitting: Internal Medicine

## 2023-03-08 ENCOUNTER — Ambulatory Visit: Payer: BC Managed Care – PPO | Admitting: Physical Therapy

## 2023-03-08 VITALS — BP 124/70 | HR 77 | Temp 97.8°F | Ht 61.0 in | Wt 167.6 lb

## 2023-03-08 DIAGNOSIS — Z6831 Body mass index (BMI) 31.0-31.9, adult: Secondary | ICD-10-CM

## 2023-03-08 DIAGNOSIS — I1310 Hypertensive heart and chronic kidney disease without heart failure, with stage 1 through stage 4 chronic kidney disease, or unspecified chronic kidney disease: Secondary | ICD-10-CM

## 2023-03-08 DIAGNOSIS — Z23 Encounter for immunization: Secondary | ICD-10-CM

## 2023-03-08 DIAGNOSIS — R262 Difficulty in walking, not elsewhere classified: Secondary | ICD-10-CM

## 2023-03-08 DIAGNOSIS — Z794 Long term (current) use of insulin: Secondary | ICD-10-CM

## 2023-03-08 DIAGNOSIS — I25118 Atherosclerotic heart disease of native coronary artery with other forms of angina pectoris: Secondary | ICD-10-CM | POA: Diagnosis not present

## 2023-03-08 DIAGNOSIS — M79671 Pain in right foot: Secondary | ICD-10-CM

## 2023-03-08 DIAGNOSIS — E6609 Other obesity due to excess calories: Secondary | ICD-10-CM

## 2023-03-08 DIAGNOSIS — R600 Localized edema: Secondary | ICD-10-CM

## 2023-03-08 DIAGNOSIS — M6281 Muscle weakness (generalized): Secondary | ICD-10-CM

## 2023-03-08 DIAGNOSIS — N1831 Chronic kidney disease, stage 3a: Secondary | ICD-10-CM | POA: Diagnosis not present

## 2023-03-08 NOTE — Patient Instructions (Signed)

## 2023-03-08 NOTE — Therapy (Signed)
OUTPATIENT PHYSICAL THERAPY LOWER EXTREMITY    Patient Name: Teresa Grant MRN: [id number] DOB:Mar 17, 1961, 62 y.o., female Today's Date: 03/08/2023  END OF SESSION:  PT End of Session - 03/08/23 0750     Visit Number 4    Date for PT Re-Evaluation 04/26/23    PT Start Time 0750    PT Stop Time 0835    PT Time Calculation (min) 45 min              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   Diabetes mellitus    Dysrhythmia    1985   Heart murmur    HLD (hyperlipidemia)    Hypertension    Kidney stone on right side 03/2013   Pneumonia    7 yrs ago   Past Surgical History:  Procedure Laterality Date   ABDOMINAL HYSTERECTOMY  2001   BACK SURGERY     CERVICAL SPINE SURGERY  06/06/2019   Dr. Franky Macho, performed at surgical center   CESAREAN SECTION  1990   COLONOSCOPY     Dr. Arlyce Dice normal   LEFT HEART CATH AND CORONARY ANGIOGRAPHY N/A 11/03/2021   Procedure: LEFT HEART CATH AND CORONARY ANGIOGRAPHY;  Surgeon: Yvonne Kendall, MD;  Location: [facility];  Service: Cardiovascular;  Laterality: N/A;   LUMBAR LAMINECTOMY/DECOMPRESSION MICRODISCECTOMY  03/20/2012   Procedure: LUMBAR LAMINECTOMY/DECOMPRESSION MICRODISCECTOMY;  Surgeon: Emilee Hero, MD;  Location: [facility] OR;  Service: Orthopedics;  Laterality: Left;  Left sided lumbar 4-5 microdisectomy   SPINE SURGERY  2013   Patient Active Problem List   Diagnosis Date Noted   Abdominal bloating 01/08/2023   Chronic idiopathic constipation 01/08/2023   Colon cancer screening 01/08/2023   Other long term (current) drug therapy 01/08/2023   Type 2 diabetes mellitus with hyperglycemia (HCC) 01/08/2023   Aortic atherosclerosis (HCC) 12/18/2022   Pain of right heel 12/18/2022   Stage 3a chronic kidney disease (HCC) 12/18/2022   Vulvar candidiasis 12/18/2022   Coronary artery disease of native artery of native heart with stable angina pectoris (HCC) 04/04/2022   Renal  insufficiency 12/12/2021   Class 1 obesity due to excess calories with serious comorbidity and body mass index (BMI) of 30.0 to 30.9 in adult 12/12/2021   Type 2 diabetes mellitus with diabetic polyneuropathy, with long-term current use of insulin (HCC) 06/15/2021   Dyslipidemia 06/15/2021   Uncontrolled type 2 diabetes mellitus with hyperglycemia (HCC) 04/13/2019   Pneumonia due to COVID-19 virus 03/02/2019   Diabetes mellitus type 2 in obese 03/02/2019   Tension headache 11/13/2018   Cervical radiculopathy 11/13/2018   Stable angina (HCC) 07/10/2014   Abdominal pain 07/10/2014   Type II or unspecified type diabetes mellitus without mention of complication, uncontrolled 09/05/2013   Renal calculus, right 06/12/2013   Diabetes with proteinuria 11/05/2012   DDD (degenerative disc disease), lumbar 06/23/2012   Hyperlipidemia 11/27/2006   Essential hypertension 11/27/2006    PCP: Dorothyann Peng, MD   REFERRING PROVIDER: Felecia Shelling, DPM  REFERRING DIAG: M72.2 (ICD-10-CM) - Plantar fasciitis, bilateral   THERAPY DIAG:  Difficulty in walking, not elsewhere classified  Bilateral foot pain  Muscle weakness (generalized)  Localized edema  Rationale for Evaluation and Treatment: Rehabilitation  ONSET DATE: 02/05/23  SUBJECTIVE:   SUBJECTIVE STATEMENT: went to a funeral and wore heels, my feet where killing me. Stretching helps temporarly  PERTINENT HISTORY: Per referring physician note: 62 y.o. female presenting today for follow-up evaluation of plantar  fasciitis bilateral over 1 year now.  Patient works on her feet in [employer] all day long.  She continues to have pain and tenderness.  She says the injections and the prednisone pack elevated her blood sugars drastically.  She continues to take the meloxicam daily.  She also says that the plantar fascia braces were more bothersome to her feet and cause more pain.   PAIN:  Are you having pain? Yes: NPRS scale:  8/10 Pain location: B feet Pain description: burning, severe Aggravating factors: resting, standing for > 1 hour Relieving factors: Foot massages  PRECAUTIONS: None  WEIGHT BEARING RESTRICTIONS: No  FALLS:  Has patient fallen in last 6 months? No  LIVING ENVIRONMENT: Lives with: lives with their family Lives in: House/apartment Stairs: Yes: Internal: 14 steps; on right going up and External: 5 steps; none, has difficulty on some days due to pain, 1 step at a time. Has following equipment at home: None  OCCUPATION: Catering business, on feet all day  PLOF: Independent  PATIENT GOALS: Pain relief, be able to manage the pain.  NEXT MD VISIT: 3 months  OBJECTIVE:   DIAGNOSTIC FINDINGS:  Radiographic exam B/L feet 01/08/2023: Normal osseous mineralization. Joint spaces preserved. No fracture/dislocation/boney destruction. No other soft tissue abnormalities or radiopaque foreign bodies.  Plantar heel spurs noted bilateral   PATIENT SURVEYS:  FOTO 33.7  COGNITION: Overall cognitive status: Within functional limits for tasks assessed     SENSATION: Patient reports some tingling in R hip and LE  EDEMA:  Mild to mod swelling per patient  MUSCLE LENGTH: Hamstrings: Right 75 deg; Left 60 deg Thomas test: B hip flexors tight and painful beyond neutral.  POSTURE: rounded shoulders, decreased lumbar lordosis, and decreased thoracic kyphosis B pes planus, L > R. Tried to wear orthosis provided by Dr, but too painful. Wears Sketchers with some arch support built in when working.  PALPATION: B feet are TTP on ant calcaneus, R medial gastroc and L peroneus longus TP proximally  LOWER EXTREMITY ROM: B hips and knees generally WFL, B feet stiff, toe ext limited  Passive ROM Right eval Left eval ACTIVE 03/08/23 RT/Left  Hip flexion     Hip extension     Hip abduction     Hip adduction     Hip internal rotation     Hip external rotation     Knee flexion      Knee  extension     Ankle dorsiflexion 7 8 WFLS  Ankle plantarflexion     Ankle inversion     Ankle eversion      LOWER EXTREMITY MMT: B hips grossly 4-/5, knees 4/5, B ankles 3+, limited by pain.  GAIT: Distance walked: 83' Assistive device utilized: None Level of assistance: Modified independence Comments: Antalgic gait pattern with B LE, slow Wearing slides with inserts, but still very limited arch support   TODAY'S TREATMENT:  DATE:   03/08/23 Rocker board 20 x DF/PF Pro fit 3 x each leg 10 sec hold SL dyna disc ROM 10 x 4 way Black bar DF and PF 2 sets 10 Green tband DF/PF 2 sets 10 Passive stretching BIL feet STM BIL feet Estim premod BIL PF   03/02/23 STM to BIL feet  Passive BIL stretch [place] BIL PF on a stretch with firm pressure 6 min each foot Towel scrunches 2x10 SLS 30s Tandem standing 30s  Ankle TB DF/PF green band x10  Calf stretch 30s Calf raises 2x10    02/27/23 [place] BIL PF on a stretch with firm pressure 6 min each foot DTW BIL PF Stretching passively BIL PF DN RT PF by [facility] PT Gave info from [employer] for PF sleeve to wear   02/15/23  Education  PATIENT EDUCATION:  Education details: POC Person educated: Patient Education method: Explanation Education comprehension: verbalized understanding  HOME EXERCISE PROGRAM: TBD  ASSESSMENT:  CLINICAL IMPRESSION: STG met. LTG #2 met as DF AROM with WFLs. Progressed ex, added stretching and tenderness and TP still noted in PF so trial of estim as [place] had NE.    OBJECTIVE IMPAIRMENTS: Abnormal gait, decreased activity tolerance, difficulty walking, decreased ROM, decreased strength, increased edema, impaired flexibility, postural dysfunction, and pain.   ACTIVITY LIMITATIONS: standing, squatting, stairs, transfers, and locomotion level  PARTICIPATION LIMITATIONS: meal prep, cleaning,  laundry, shopping, community activity, and occupation  PERSONAL FACTORS: Past/current experiences are also affecting patient's functional outcome.   REHAB POTENTIAL: Good  CLINICAL DECISION MAKING: Evolving/moderate complexity  EVALUATION COMPLEXITY: Low   GOALS: Goals reviewed with patient? Yes  SHORT TERM GOALS: Target date: 03/01/23 I with initial HEP Baseline: Goal status: 03/08/23 MET LONG TERM GOALS: Target date: 04/26/23  I with final HEP Baseline:  Goal status: INITIAL  2.  Increase B DF PROM to > 20 degrees Baseline: 7R, 8L Goal status: 03/08/23 MET  3.  Patient will report 75% improvement in her pain in her feet overall Baseline: 10/10 Goal status: INITIAL  4.  Patient will be able to walk up and down her steps in step over, with U rail, pain < 3/10 Baseline: 10/10 Goal status: INITIAL  5.  Patient will tolerate working a full shift at work of at least 2 hours with B foot pain < 3/10 Baseline: 10/10 Goal status: INITIAL PLAN:  PT FREQUENCY: 1-2x/week  PT DURATION: 10 weeks  PLANNED INTERVENTIONS: Therapeutic exercises, Therapeutic activity, Neuromuscular re-education, Balance training, Gait training, Patient/Family education, Self Care, Joint mobilization, Stair training, Dry Needling, Electrical stimulation, Cryotherapy, Moist heat, Splintting, Taping, Vasopneumatic device, Ultrasound, Ionotophoresis 4mg /ml Dexamethasone, and Manual therapy  PLAN FOR NEXT SESSION: HEP,assess today session and progress    Caspian Deleonardis,ANGIE, PTA 03/08/2023, 7:50 AM  [facility] at [facility] [address] [place], [place], [address] Phone: [phone]   Fax:  [phone] at [facility] [address] [place], [place], [address] Phone: [phone]   Fax:  [phone]  Patient Details  Name: INASIA BRAULT MRN: [id number] Date of Birth: [birth year] Referring Provider:   Felecia Shelling, DPM  Encounter Date: 03/08/2023   Suanne Marker, PTA 03/08/2023, 7:50 AM  [facility] at [facility] [address] [facility] [place], [place], [address] Phone: [phone]   Fax:  [phone]

## 2023-03-08 NOTE — Progress Notes (Signed)
Pura Spice as a Neurosurgeon for [name], MD.,have documented all relevant documentation on the behalf of [name], MD,as directed by  [name], MD while in the presence of [name], MD.  Subjective:  Patient ID: Teresa Grant , female    DOB: [birth year] , 62 y.o.   MRN: [id number]  Chief Complaint  Patient presents with   Hypertension    HPI  Patient presents today for blood pressure check. She reports compliance with medications. The dose of telmisartan was increased at her last visit. She is now taking 2 tabs instead of one. Denies headache, chest pain, and SOB.  She has not had any issues with this regimen.   Diabetes She presents for her follow-up diabetic visit. She has type 2 diabetes mellitus. Her disease course has been stable. There are no hypoglycemic associated symptoms. Pertinent negatives for hypoglycemia include no headaches. Pertinent negatives for diabetes include no blurred vision, no polydipsia, no polyphagia and no polyuria. There are no hypoglycemic complications. Risk factors for coronary artery disease include diabetes mellitus, dyslipidemia, hypertension, obesity, post-menopausal, sedentary lifestyle and stress. She is following a diabetic diet. She participates in exercise intermittently. Her breakfast blood glucose is taken between 8-9 am. Her breakfast blood glucose range is generally 130-140 mg/dl. An ACE inhibitor/angiotensin II receptor blocker is being taken. Eye exam is current.  Hypertension This is a chronic problem. The current episode started more than 1 year ago. The problem has been gradually improving since onset. The problem is controlled. Pertinent negatives include no blurred vision, headaches or palpitations. The current treatment provides moderate improvement. Compliance problems include exercise.      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   Diabetes mellitus    Dysrhythmia     1985   Heart murmur    HLD (hyperlipidemia)    Hypertension    Kidney stone on right side 03/2013   Pneumonia    7 yrs ago     Family History  Problem Relation Age of Onset   Hypertension Mother    Stroke Mother    Heart attack Mother 33   Heart disease Mother    Hypertension Father    Kidney disease Father    Hypertension Sister    Hypertension Maternal Aunt    Hypertension Maternal Uncle    Esophageal cancer Maternal Uncle    Diabetes Paternal Aunt    Diabetes Paternal Uncle    Colon cancer Cousin    Hypertension Brother    Cancer Neg Hx    Colon polyps Neg Hx    Rectal cancer Neg Hx    Stomach cancer Neg Hx    Breast cancer Neg Hx      Current Outpatient Medications:    amLODipine (NORVASC) 10 MG tablet, TAKE 1 TABLET BY MOUTH DAILY, Disp: 90 tablet, Rfl: 0   Ascorbic Acid (VITAMIN C PO), Take 500 mg by mouth every evening., Disp: , Rfl:    aspirin EC 81 MG tablet, Take 1 tablet (81 mg total) by mouth daily. Swallow whole., Disp: 90 tablet, Rfl: 3   Aspirin-Salicylamide-Caffeine (BC FAST PAIN RELIEF) 650-195-33.3 MG PACK, Take 1 packet by mouth daily as needed (pain.)., Disp: , Rfl:    atorvastatin (LIPITOR) 40 MG tablet, Take 1 tablet (40 mg total) by mouth daily. Take 1 tablet daily, skip Sundays., Disp: 90 tablet, Rfl: 2   Dapagliflozin Pro-metFORMIN ER (XIGDUO XR) 01-999 MG TB24, Take  2 tablets by mouth daily in the afternoon., Disp: 180 tablet, Rfl: 3   famotidine (PEPCID) 20 MG tablet, Take 1 tablet (20 mg total) by mouth 2 (two) times daily., Disp: 180 tablet, Rfl: 1   gabapentin (NEURONTIN) 100 MG capsule, TAKE ONE CAPSULE BY MOUTH THREE TIMES A DAY (Patient taking differently: 100 mg 2 (two) times daily.), Disp: 270 capsule, Rfl: 1   glimepiride (AMARYL) 1 MG tablet, Take 1 tablet (1 mg total) by mouth daily with breakfast., Disp: 90 tablet, Rfl: 3   glucose blood (ONETOUCH VERIO) test strip, Use as directed to check blood sugars 2 times per day dx: e11.65, Disp:  100 each, Rfl: 10   insulin degludec (TRESIBA FLEXTOUCH) 200 UNIT/ML FlexTouch Pen, Inject 28 Units into the skin at bedtime., Disp: 30 mL, Rfl: 3   Insulin Pen Needle 32G X 4 MM MISC, 1 Device by Does not apply route daily in the afternoon., Disp: 100 each, Rfl: 3   LINZESS 290 MCG CAPS capsule, Take 290 mcg by mouth daily., Disp: , Rfl:    meloxicam (MOBIC) 15 MG tablet, Take 1 tablet (15 mg total) by mouth daily., Disp: 30 tablet, Rfl: 1   ondansetron (ZOFRAN) 4 MG tablet, Take 1 tablet (4 mg total) by mouth every 8 (eight) hours as needed for nausea or vomiting., Disp: 15 tablet, Rfl: 0   telmisartan (MICARDIS) 20 MG tablet, TAKE ONE TABLET BY MOUTH EVERY EVENING, Disp: 90 tablet, Rfl: 1   Vitamin D, Ergocalciferol, (DRISDOL) 1.25 MG (50000 UNIT) CAPS capsule, TAKE 1 CAPSULE BY MOUTH ONCE WEEKLY, Disp: 12 capsule, Rfl: 1   Zinc Sulfate (ZINCATE PO), Take 50 mg by mouth every evening., Disp: , Rfl:    nitroGLYCERIN (NITROSTAT) 0.4 MG SL tablet, Place 1 tablet (0.4 mg total) under the tongue every 5 (five) minutes as needed for chest pain. UP TO 3 TABLETS, Disp: 25 tablet, Rfl: PRN   Allergies  Allergen Reactions   Lisinopril Cough   Atorvastatin Other (See Comments)   Glipizide Other (See Comments)   Hydrochlorothiazide Other (See Comments)    pancreatitis     Review of Systems  Constitutional: Negative.   Eyes:  Negative for blurred vision.  Respiratory: Negative.    Cardiovascular: Negative.  Negative for palpitations.  Gastrointestinal: Negative.   Endocrine: Negative for polydipsia, polyphagia and polyuria.  Musculoskeletal: Negative.   Neurological: Negative.  Negative for headaches.  Psychiatric/Behavioral: Negative.       Today's Vitals   03/08/23 1609  BP: 124/70  Pulse: 77  Temp: 97.8 F (36.6 C)  SpO2: 98%  Weight: 167 lb 9.6 oz (76 kg)  Height: 5\' 1"  (1.549 m)   Body mass index is 31.67 kg/m.  Wt Readings from Last 3 Encounters:  03/08/23 167 lb 9.6 oz (76  kg)  02/16/23 163 lb 6.4 oz (74.1 kg)  01/22/23 159 lb 6.4 oz (72.3 kg)    The 10-year ASCVD risk score (Arnett DK, et al., 2019) is: 14.3%   Values used to calculate the score:     Age: 54 years     Sex: Female     Is Non-Hispanic African American: Yes     Diabetic: Yes     Tobacco smoker: No     Systolic Blood Pressure: 124 mmHg     Is BP treated: Yes     HDL Cholesterol: 40 mg/dL     Total Cholesterol: 156 mg/dL  Objective:  Physical Exam Vitals and nursing note reviewed.  Constitutional:      Appearance: Normal appearance.  HENT:     Head: Normocephalic and atraumatic.  Eyes:     Extraocular Movements: Extraocular movements intact.  Cardiovascular:     Rate and Rhythm: Normal rate and regular rhythm.     Heart sounds: Normal heart sounds.  Pulmonary:     Effort: Pulmonary effort is normal.     Breath sounds: Normal breath sounds.  Musculoskeletal:     Cervical back: Normal range of motion.  Skin:    General: Skin is warm.  Neurological:     General: No focal deficit present.     Mental Status: She is alert.  Psychiatric:        Mood and Affect: Mood normal.        Behavior: Behavior normal.         Assessment And Plan:  1. Hypertensive heart and renal disease with renal failure, stage 1 through stage 4 or unspecified chronic kidney disease, without heart failure Comments: Chronic, well controlled.  She will c/w telmisartan 40mg  and amlodipine 10mg  daily. I will send new rx 40mg  telmisartan. I will check BMP today. - BMP8+eGFR  2. Stage 3a chronic kidney disease (HCC) Comments: Chronic, advised to avoid NSAIDs, stay well hydrated and keep BP well controlled to decrease risk of CKD progression.  3. Coronary artery disease of native artery of native heart with stable angina pectoris (HCC) Comments: Chronic, encouraged to follow heart healthy lifestyle. She will c/w [place] 10mg  daily, ASA 81mg  and atorvastatin 40mg  daily.  4. Class 1 obesity due to excess  calories with serious comorbidity and body mass index (BMI) of 31.0 to 31.9 in adult Comments: She is encouraged to aim for at least 150 minutes of exercise per week, while striving to lose ten pounds to decrease cardiac risk.  5. Immunization due - Tdap vaccine greater than or equal to 7yo IM   Return in 4 months (on 07/08/2023), or bp check, for 4 month dm & bpc.  Patient was given opportunity to ask questions. Patient verbalized understanding of the plan and was able to repeat key elements of the plan. All questions were answered to their satisfaction.   I, [name], MD, have reviewed all documentation for this visit. The documentation on 03/11/23 for the exam, diagnosis, procedures, and orders are all accurate and complete.   IF YOU HAVE BEEN REFERRED TO A SPECIALIST, IT MAY TAKE 1-2 WEEKS TO SCHEDULE/PROCESS THE REFERRAL. IF YOU HAVE NOT HEARD FROM US/SPECIALIST IN TWO WEEKS, PLEASE GIVE [place] A CALL AT [phone].

## 2023-03-09 ENCOUNTER — Ambulatory Visit: Payer: BC Managed Care – PPO | Admitting: Physical Therapy

## 2023-03-09 DIAGNOSIS — R262 Difficulty in walking, not elsewhere classified: Secondary | ICD-10-CM | POA: Diagnosis not present

## 2023-03-09 DIAGNOSIS — M79671 Pain in right foot: Secondary | ICD-10-CM

## 2023-03-09 DIAGNOSIS — M6281 Muscle weakness (generalized): Secondary | ICD-10-CM

## 2023-03-09 LAB — BMP8+EGFR
BUN/Creatinine Ratio: 14 (ref 12–28)
BUN: 15 mg/dL (ref 8–27)
CO2: 23 mmol/L (ref 20–29)
Calcium: 9.5 mg/dL (ref 8.7–10.3)
Chloride: 105 mmol/L (ref 96–106)
Creatinine, Ser: 1.11 mg/dL — ABNORMAL HIGH (ref 0.57–1.00)
Glucose: 132 mg/dL — ABNORMAL HIGH (ref 70–99)
Potassium: 4 mmol/L (ref 3.5–5.2)
Sodium: 141 mmol/L (ref 134–144)
eGFR: 57 mL/min/{1.73_m2} — ABNORMAL LOW (ref >59)

## 2023-03-09 NOTE — Therapy (Signed)
OUTPATIENT PHYSICAL THERAPY LOWER EXTREMITY    Patient Name: Teresa Grant MRN: [id number] DOB:1961-09-25, 62 y.o., female Today's Date: 03/09/2023  END OF SESSION:  PT End of Session - 03/09/23 0924     Visit Number 5    Date for PT Re-Evaluation 04/26/23    PT Start Time 0925    PT Stop Time 1010    PT Time Calculation (min) 45 min              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   Diabetes mellitus    Dysrhythmia    1985   Heart murmur    HLD (hyperlipidemia)    Hypertension    Kidney stone on right side 03/2013   Pneumonia    7 yrs ago   Past Surgical History:  Procedure Laterality Date   ABDOMINAL HYSTERECTOMY  2001   BACK SURGERY     CERVICAL SPINE SURGERY  06/06/2019   Dr. Franky Macho, performed at surgical center   CESAREAN SECTION  1990   COLONOSCOPY     Dr. Arlyce Dice normal   LEFT HEART CATH AND CORONARY ANGIOGRAPHY N/A 11/03/2021   Procedure: LEFT HEART CATH AND CORONARY ANGIOGRAPHY;  Surgeon: Yvonne Kendall, MD;  Location: [facility];  Service: Cardiovascular;  Laterality: N/A;   LUMBAR LAMINECTOMY/DECOMPRESSION MICRODISCECTOMY  03/20/2012   Procedure: LUMBAR LAMINECTOMY/DECOMPRESSION MICRODISCECTOMY;  Surgeon: Emilee Hero, MD;  Location: [facility] OR;  Service: Orthopedics;  Laterality: Left;  Left sided lumbar 4-5 microdisectomy   SPINE SURGERY  2013   Patient Active Problem List   Diagnosis Date Noted   Abdominal bloating 01/08/2023   Chronic idiopathic constipation 01/08/2023   Colon cancer screening 01/08/2023   Other long term (current) drug therapy 01/08/2023   Type 2 diabetes mellitus with hyperglycemia (HCC) 01/08/2023   Aortic atherosclerosis (HCC) 12/18/2022   Pain of right heel 12/18/2022   Stage 3a chronic kidney disease (HCC) 12/18/2022   Vulvar candidiasis 12/18/2022   Coronary artery disease of native artery of native heart with stable angina pectoris (HCC) 04/04/2022   Renal  insufficiency 12/12/2021   Class 1 obesity due to excess calories with serious comorbidity and body mass index (BMI) of 30.0 to 30.9 in adult 12/12/2021   Type 2 diabetes mellitus with diabetic polyneuropathy, with long-term current use of insulin (HCC) 06/15/2021   Dyslipidemia 06/15/2021   Uncontrolled type 2 diabetes mellitus with hyperglycemia (HCC) 04/13/2019   Pneumonia due to COVID-19 virus 03/02/2019   Diabetes mellitus type 2 in obese 03/02/2019   Tension headache 11/13/2018   Cervical radiculopathy 11/13/2018   Stable angina (HCC) 07/10/2014   Abdominal pain 07/10/2014   Type II or unspecified type diabetes mellitus without mention of complication, uncontrolled 09/05/2013   Renal calculus, right 06/12/2013   Diabetes with proteinuria 11/05/2012   DDD (degenerative disc disease), lumbar 06/23/2012   Hyperlipidemia 11/27/2006   Essential hypertension 11/27/2006    PCP: Dorothyann Peng, MD   REFERRING PROVIDER: Felecia Shelling, DPM  REFERRING DIAG: M72.2 (ICD-10-CM) - Plantar fasciitis, bilateral   THERAPY DIAG:  Difficulty in walking, not elsewhere classified  Bilateral foot pain  Muscle weakness (generalized)  Rationale for Evaluation and Treatment: Rehabilitation  ONSET DATE: 02/05/23  SUBJECTIVE:   SUBJECTIVE STATEMENT: first thing in morning is rough. STW and stretching helps the most. I am just on my feet all day  PERTINENT HISTORY: Per referring physician note: 62 y.o. female presenting today for follow-up evaluation  of plantar fasciitis bilateral over 1 year now.  Patient works on her feet in [employer] all day long.  She continues to have pain and tenderness.  She says the injections and the prednisone pack elevated her blood sugars drastically.  She continues to take the meloxicam daily.  She also says that the plantar fascia braces were more bothersome to her feet and cause more pain.   PAIN:  Are you having pain? Yes: NPRS scale: 8/10 Pain  location: B feet Pain description: burning, severe Aggravating factors: resting, standing for > 1 hour Relieving factors: Foot massages  PRECAUTIONS: None  WEIGHT BEARING RESTRICTIONS: No  FALLS:  Has patient fallen in last 6 months? No  LIVING ENVIRONMENT: Lives with: lives with their family Lives in: House/apartment Stairs: Yes: Internal: 14 steps; on right going up and External: 5 steps; none, has difficulty on some days due to pain, 1 step at a time. Has following equipment at home: None  OCCUPATION: Catering business, on feet all day  PLOF: Independent  PATIENT GOALS: Pain relief, be able to manage the pain.  NEXT MD VISIT: 3 months  OBJECTIVE:   DIAGNOSTIC FINDINGS:  Radiographic exam B/L feet 01/08/2023: Normal osseous mineralization. Joint spaces preserved. No fracture/dislocation/boney destruction. No other soft tissue abnormalities or radiopaque foreign bodies.  Plantar heel spurs noted bilateral   PATIENT SURVEYS:  FOTO 33.7  COGNITION: Overall cognitive status: Within functional limits for tasks assessed     SENSATION: Patient reports some tingling in R hip and LE  EDEMA:  Mild to mod swelling per patient  MUSCLE LENGTH: Hamstrings: Right 75 deg; Left 60 deg Thomas test: B hip flexors tight and painful beyond neutral.  POSTURE: rounded shoulders, decreased lumbar lordosis, and decreased thoracic kyphosis B pes planus, L > R. Tried to wear orthosis provided by Dr, but too painful. Wears Sketchers with some arch support built in when working.  PALPATION: B feet are TTP on ant calcaneus, R medial gastroc and L peroneus longus TP proximally  LOWER EXTREMITY ROM: B hips and knees generally WFL, B feet stiff, toe ext limited  Passive ROM Right eval Left eval ACTIVE 03/08/23 RT/Left  Hip flexion     Hip extension     Hip abduction     Hip adduction     Hip internal rotation     Hip external rotation     Knee flexion      Knee extension      Ankle dorsiflexion 7 8 WFLS  Ankle plantarflexion     Ankle inversion     Ankle eversion      LOWER EXTREMITY MMT: B hips grossly 4-/5, knees 4/5, B ankles 3+, limited by pain.  GAIT: Distance walked: 5' Assistive device utilized: None Level of assistance: Modified independence Comments: Antalgic gait pattern with B LE, slow Wearing slides with inserts, but still very limited arch support   TODAY'S TREATMENT:  DATE:  03/09/23 Bike L 4 Black bar DF and PF 2 sets 10 Rocker board 20 x DF/PF Pro fit 3 x each leg 10 sec hold Dyna disc SLS 10 x 4 aways BIL Step ups with airex 10 x BIL Leg press calf raises 3 sets 10 Gastroc and soleous stretching on step 3 each 10 sec Green tband DF/PF 2 sets 10 Passive stretching BIL feet STM BIL feet     03/08/23 Rocker board 20 x DF/PF Pro fit 3 x each leg 10 sec hold SL dyna disc ROM 10 x 4 way Black bar DF and PF 2 sets 10 Green tband DF/PF 2 sets 10 Passive stretching BIL feet STM BIL feet Estim premod BIL PF   03/02/23 STM to BIL feet  Passive BIL stretch [place] BIL PF on a stretch with firm pressure 6 min each foot Towel scrunches 2x10 SLS 30s Tandem standing 30s  Ankle TB DF/PF green band x10  Calf stretch 30s Calf raises 2x10    02/27/23 [place] BIL PF on a stretch with firm pressure 6 min each foot DTW BIL PF Stretching passively BIL PF DN RT PF by MAlbright PT Gave info from [employer] for PF sleeve to wear   02/15/23  Education  PATIENT EDUCATION:  Education details: POC Person educated: Patient Education method: Explanation Education comprehension: verbalized understanding  HOME EXERCISE PROGRAM: TBD  ASSESSMENT:  CLINICAL IMPRESSION: progressing ex for ROM and strength. Pt gets most relief with STW and PROM OBJECTIVE IMPAIRMENTS: Abnormal gait, decreased activity tolerance, difficulty  walking, decreased ROM, decreased strength, increased edema, impaired flexibility, postural dysfunction, and pain.   ACTIVITY LIMITATIONS: standing, squatting, stairs, transfers, and locomotion level  PARTICIPATION LIMITATIONS: meal prep, cleaning, laundry, shopping, community activity, and occupation  PERSONAL FACTORS: Past/current experiences are also affecting patient's functional outcome.   REHAB POTENTIAL: Good  CLINICAL DECISION MAKING: Evolving/moderate complexity  EVALUATION COMPLEXITY: Low   GOALS: Goals reviewed with patient? Yes  SHORT TERM GOALS: Target date: 03/01/23 I with initial HEP Baseline: Goal status: 03/08/23 MET LONG TERM GOALS: Target date: 04/26/23  I with final HEP Baseline:  Goal status: INITIAL  2.  Increase B DF PROM to > 20 degrees Baseline: 7R, 8L Goal status: 03/08/23 MET  3.  Patient will report 75% improvement in her pain in her feet overall Baseline: 10/10 Goal status: INITIAL  4.  Patient will be able to walk up and down her steps in step over, with U rail, pain < 3/10 Baseline: 10/10 Goal status: INITIAL  5.  Patient will tolerate working a full shift at work of at least 2 hours with B foot pain < 3/10 Baseline: 10/10 Goal status: INITIAL PLAN:  PT FREQUENCY: 1-2x/week  PT DURATION: 10 weeks  PLANNED INTERVENTIONS: Therapeutic exercises, Therapeutic activity, Neuromuscular re-education, Balance training, Gait training, Patient/Family education, Self Care, Joint mobilization, Stair training, Dry Needling, Electrical stimulation, Cryotherapy, Moist heat, Splintting, Taping, Vasopneumatic device, Ultrasound, Ionotophoresis 4mg /ml Dexamethasone, and Manual therapy  PLAN FOR NEXT SESSION: assess and progress    Kristina Bertone,ANGIE, PTA 03/09/2023, 9:25 AM  [facility] at [facility] [address] [facility] [place], [place], [address] Phone: [phone]   Fax:  [phone]   Outpatient Rehabilitation at [facility] [address] [facility] [address] [place], [place], [address] Phone: [phone]   Fax:  [phone]

## 2023-03-13 ENCOUNTER — Telehealth: Payer: Self-pay

## 2023-03-13 ENCOUNTER — Other Ambulatory Visit: Payer: Self-pay

## 2023-03-13 NOTE — Telephone Encounter (Signed)
Patient aware. Patient notified. we are sending rx telmisartan 40mg  daily. She will take ONE daily. She will take TWO of the 20mg  tabs until she runs out.   Also,patient notified, it is important that she takes meds as prescribed. Pharmacy report states she is not filling bp meds on a regular basis.

## 2023-03-14 ENCOUNTER — Encounter: Payer: Self-pay | Admitting: Physical Therapy

## 2023-03-14 ENCOUNTER — Ambulatory Visit: Payer: BC Managed Care – PPO | Admitting: Physical Therapy

## 2023-03-14 DIAGNOSIS — R600 Localized edema: Secondary | ICD-10-CM

## 2023-03-14 DIAGNOSIS — R293 Abnormal posture: Secondary | ICD-10-CM

## 2023-03-14 DIAGNOSIS — R262 Difficulty in walking, not elsewhere classified: Secondary | ICD-10-CM | POA: Diagnosis not present

## 2023-03-14 DIAGNOSIS — M79671 Pain in right foot: Secondary | ICD-10-CM

## 2023-03-14 DIAGNOSIS — M6281 Muscle weakness (generalized): Secondary | ICD-10-CM

## 2023-03-14 NOTE — Therapy (Signed)
OUTPATIENT PHYSICAL THERAPY LOWER EXTREMITY    Patient Name: Teresa Grant MRN: [id number] DOB:04-27-1961, 62 y.o., female Today's Date: 03/14/2023  END OF SESSION:  PT End of Session - 03/14/23 0935     Visit Number 6    Date for PT Re-Evaluation 04/26/23    PT Start Time 0930    PT Stop Time 1010    PT Time Calculation (min) 40 min    Activity Tolerance Patient tolerated treatment well    Behavior During Therapy [facility] for tasks assessed/performed              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   Diabetes mellitus    Dysrhythmia    1985   Heart murmur    HLD (hyperlipidemia)    Hypertension    Kidney stone on right side 03/2013   Pneumonia    7 yrs ago   Past Surgical History:  Procedure Laterality Date   ABDOMINAL HYSTERECTOMY  2001   BACK SURGERY     CERVICAL SPINE SURGERY  06/06/2019   Dr. Franky Macho, performed at surgical center   CESAREAN SECTION  1990   COLONOSCOPY     Dr. Arlyce Dice normal   LEFT HEART CATH AND CORONARY ANGIOGRAPHY N/A 11/03/2021   Procedure: LEFT HEART CATH AND CORONARY ANGIOGRAPHY;  Surgeon: Yvonne Kendall, MD;  Location: [facility];  Service: Cardiovascular;  Laterality: N/A;   LUMBAR LAMINECTOMY/DECOMPRESSION MICRODISCECTOMY  03/20/2012   Procedure: LUMBAR LAMINECTOMY/DECOMPRESSION MICRODISCECTOMY;  Surgeon: Emilee Hero, MD;  Location: [facility] OR;  Service: Orthopedics;  Laterality: Left;  Left sided lumbar 4-5 microdisectomy   SPINE SURGERY  2013   Patient Active Problem List   Diagnosis Date Noted   Abdominal bloating 01/08/2023   Chronic idiopathic constipation 01/08/2023   Colon cancer screening 01/08/2023   Other long term (current) drug therapy 01/08/2023   Type 2 diabetes mellitus with hyperglycemia (HCC) 01/08/2023   Aortic atherosclerosis (HCC) 12/18/2022   Pain of right heel 12/18/2022   Stage 3a chronic kidney disease (HCC) 12/18/2022   Vulvar candidiasis 12/18/2022    Coronary artery disease of native artery of native heart with stable angina pectoris (HCC) 04/04/2022   Renal insufficiency 12/12/2021   Class 1 obesity due to excess calories with serious comorbidity and body mass index (BMI) of 30.0 to 30.9 in adult 12/12/2021   Type 2 diabetes mellitus with diabetic polyneuropathy, with long-term current use of insulin (HCC) 06/15/2021   Dyslipidemia 06/15/2021   Uncontrolled type 2 diabetes mellitus with hyperglycemia (HCC) 04/13/2019   Pneumonia due to COVID-19 virus 03/02/2019   Diabetes mellitus type 2 in obese 03/02/2019   Tension headache 11/13/2018   Cervical radiculopathy 11/13/2018   Stable angina (HCC) 07/10/2014   Abdominal pain 07/10/2014   Type II or unspecified type diabetes mellitus without mention of complication, uncontrolled 09/05/2013   Renal calculus, right 06/12/2013   Diabetes with proteinuria 11/05/2012   DDD (degenerative disc disease), lumbar 06/23/2012   Hyperlipidemia 11/27/2006   Essential hypertension 11/27/2006    PCP: Dorothyann Peng, MD   REFERRING PROVIDER: Felecia Shelling, DPM  REFERRING DIAG: M72.2 (ICD-10-CM) - Plantar fasciitis, bilateral   THERAPY DIAG:  Difficulty in walking, not elsewhere classified  Bilateral foot pain  Muscle weakness (generalized)  Abnormal posture  Localized edema  Rationale for Evaluation and Treatment: Rehabilitation  ONSET DATE: 02/05/23  SUBJECTIVE:   SUBJECTIVE STATEMENT:  Patient reports that her initial pain seems to be  worse. It takes about 15 minutes of activity to get relief from the pain. She is having throbbing and burning at rest.  PERTINENT HISTORY: Per referring physician note: 62 y.o. female presenting today for follow-up evaluation of plantar fasciitis bilateral over 1 year now.  Patient works on her feet in [employer] all day long.  She continues to have pain and tenderness.  She says the injections and the prednisone pack elevated her blood  sugars drastically.  She continues to take the meloxicam daily.  She also says that the plantar fascia braces were more bothersome to her feet and cause more pain.   PAIN:  Are you having pain? Yes: NPRS scale: 8/10 Pain location: B feet Pain description: burning, severe Aggravating factors: resting, standing for > 1 hour Relieving factors: Foot massages  PRECAUTIONS: None  WEIGHT BEARING RESTRICTIONS: No  FALLS:  Has patient fallen in last 6 months? No  LIVING ENVIRONMENT: Lives with: lives with their family Lives in: House/apartment Stairs: Yes: Internal: 14 steps; on right going up and External: 5 steps; none, has difficulty on some days due to pain, 1 step at a time. Has following equipment at home: None  OCCUPATION: Catering business, on feet all day  PLOF: Independent  PATIENT GOALS: Pain relief, be able to manage the pain.  NEXT MD VISIT: 3 months  OBJECTIVE:   DIAGNOSTIC FINDINGS:  Radiographic exam B/L feet 01/08/2023: Normal osseous mineralization. Joint spaces preserved. No fracture/dislocation/boney destruction. No other soft tissue abnormalities or radiopaque foreign bodies.  Plantar heel spurs noted bilateral   PATIENT SURVEYS:  FOTO 33.7  COGNITION: Overall cognitive status: Within functional limits for tasks assessed     SENSATION: Patient reports some tingling in R hip and LE  EDEMA:  Mild to mod swelling per patient  MUSCLE LENGTH: Hamstrings: Right 75 deg; Left 60 deg Thomas test: B hip flexors tight and painful beyond neutral.  POSTURE: rounded shoulders, decreased lumbar lordosis, and decreased thoracic kyphosis B pes planus, L > R. Tried to wear orthosis provided by Dr, but too painful. Wears Sketchers with some arch support built in when working.  PALPATION: B feet are TTP on ant calcaneus, R medial gastroc and L peroneus longus TP proximally  LOWER EXTREMITY ROM: B hips and knees generally WFL, B feet stiff, toe ext limited  Passive  ROM Right eval Left eval ACTIVE 03/08/23 RT/Left  Hip flexion     Hip extension     Hip abduction     Hip adduction     Hip internal rotation     Hip external rotation     Knee flexion      Knee extension     Ankle dorsiflexion 7 8 WFLS  Ankle plantarflexion     Ankle inversion     Ankle eversion      LOWER EXTREMITY MMT: B hips grossly 4-/5, knees 4/5, B ankles 3+, limited by pain.  GAIT: Distance walked: 28' Assistive device utilized: None Level of assistance: Modified independence Comments: Antalgic gait pattern with B LE, slow Wearing slides with inserts, but still very limited arch support   TODAY'S TREATMENT:  DATE:  03/14/23 Bike L3 x 6 minutes Deep STM with Theragun, with stretch for toe ext and ankle DF/toe flex and ankle PF Ankle strengthening in all planes, DF, PF, Inver, ever 10 reps each  03/09/23 Bike L 4 Black bar DF and PF 2 sets 10 Rocker board 20 x DF/PF Pro fit 3 x each leg 10 sec hold Dyna disc SLS 10 x 4 aways BIL Step ups with airex 10 x BIL Leg press calf raises 3 sets 10 Gastroc and soleous stretching on step 3 each 10 sec Green tband DF/PF 2 sets 10 Passive stretching BIL feet STM BIL feet  03/08/23 Rocker board 20 x DF/PF Pro fit 3 x each leg 10 sec hold SL dyna disc ROM 10 x 4 way Black bar DF and PF 2 sets 10 Green tband DF/PF 2 sets 10 Passive stretching BIL feet STM BIL feet Estim premod BIL PF  03/02/23 STM to BIL feet  Passive BIL stretch [place] BIL PF on a stretch with firm pressure 6 min each foot Towel scrunches 2x10 SLS 30s Tandem standing 30s  Ankle TB DF/PF green band x10  Calf stretch 30s Calf raises 2x10  02/27/23 [place] BIL PF on a stretch with firm pressure 6 min each foot DTW BIL PF Stretching passively BIL PF DN RT PF by MAlbright PT Gave info from [employer] for PF sleeve to wear   02/15/23   Education  PATIENT EDUCATION:  Education details: POC Person educated: Patient Education method: Explanation Education comprehension: verbalized understanding  HOME EXERCISE PROGRAM: [id number]  ASSESSMENT:  CLINICAL IMPRESSION:  Patient reports very little relief. Her pain seems more intense when she has it, but it does go away after about 15 minutes of activity. She does feel the manual therapy helps. Provided deep tissue mobilization and also initiated HEP. Will assess her tolerance to the HEP on next visit.  OBJECTIVE IMPAIRMENTS: Abnormal gait, decreased activity tolerance, difficulty walking, decreased ROM, decreased strength, increased edema, impaired flexibility, postural dysfunction, and pain.   ACTIVITY LIMITATIONS: standing, squatting, stairs, transfers, and locomotion level  PARTICIPATION LIMITATIONS: meal prep, cleaning, laundry, shopping, community activity, and occupation  PERSONAL FACTORS: Past/current experiences are also affecting patient's functional outcome.   REHAB POTENTIAL: Good  CLINICAL DECISION MAKING: Evolving/moderate complexity  EVALUATION COMPLEXITY: Low   GOALS: Goals reviewed with patient? Yes  SHORT TERM GOALS: Target date: 03/01/23 I with initial HEP Baseline: Goal status: 03/08/23 MET LONG TERM GOALS: Target date: 04/26/23  I with final HEP Baseline:  Goal status: INITIAL  2.  Increase B DF PROM to > 20 degrees Baseline: 7R, 8L Goal status: 03/08/23 MET  3.  Patient will report 75% improvement in her pain in her feet overall Baseline: 10/10 Goal status: 03/14/23 no relief at this time. ongoing  4.  Patient will be able to walk up and down her steps in step over, with U rail, pain < 3/10 Baseline: 10/10 Goal status: INITIAL  5.  Patient will tolerate working a full shift at work of at least 2 hours with B foot pain < 3/10 Baseline: 10/10 Goal status: INITIAL PLAN:  PT FREQUENCY: 1-2x/week  PT DURATION: 10 weeks  PLANNED  INTERVENTIONS: Therapeutic exercises, Therapeutic activity, Neuromuscular re-education, Balance training, Gait training, Patient/Family education, Self Care, Joint mobilization, Stair training, Dry Needling, Electrical stimulation, Cryotherapy, Moist heat, Splintting, Taping, Vasopneumatic device, Ultrasound, Ionotophoresis 4mg /ml Dexamethasone, and Manual therapy  PLAN FOR NEXT SESSION: assess and progress  Oley Balm DPT 03/14/23 2:00 PM  [facility] Out[facility] Rehabilitation at [facility] [address] [facility] [place], [place], [address] Phone: [phone]   Fax:  [phone]

## 2023-03-16 ENCOUNTER — Encounter: Payer: Self-pay | Admitting: Physical Therapy

## 2023-03-16 ENCOUNTER — Ambulatory Visit: Payer: BC Managed Care – PPO | Admitting: Physical Therapy

## 2023-03-16 DIAGNOSIS — R600 Localized edema: Secondary | ICD-10-CM

## 2023-03-16 DIAGNOSIS — R262 Difficulty in walking, not elsewhere classified: Secondary | ICD-10-CM

## 2023-03-16 DIAGNOSIS — M79671 Pain in right foot: Secondary | ICD-10-CM

## 2023-03-16 DIAGNOSIS — M6281 Muscle weakness (generalized): Secondary | ICD-10-CM

## 2023-03-16 DIAGNOSIS — R293 Abnormal posture: Secondary | ICD-10-CM

## 2023-03-16 NOTE — Therapy (Signed)
OUTPATIENT PHYSICAL THERAPY LOWER EXTREMITY    Patient Name: Teresa Grant MRN: [id number] DOB:Nov 28, 1960, 62 y.o., female Today's Date: 03/16/2023  END OF SESSION:  PT End of Session - 03/16/23 0756     Visit Number 7    Date for PT Re-Evaluation 04/26/23    PT Start Time 0756    PT Stop Time 0840    PT Time Calculation (min) 44 min    Activity Tolerance Patient tolerated treatment well    Behavior During Therapy [facility] for tasks assessed/performed              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   Diabetes mellitus    Dysrhythmia    1985   Heart murmur    HLD (hyperlipidemia)    Hypertension    Kidney stone on right side 03/2013   Pneumonia    7 yrs ago   Past Surgical History:  Procedure Laterality Date   ABDOMINAL HYSTERECTOMY  2001   BACK SURGERY     CERVICAL SPINE SURGERY  06/06/2019   Dr. Franky Macho, performed at surgical center   CESAREAN SECTION  1990   COLONOSCOPY     Dr. Arlyce Dice normal   LEFT HEART CATH AND CORONARY ANGIOGRAPHY N/A 11/03/2021   Procedure: LEFT HEART CATH AND CORONARY ANGIOGRAPHY;  Surgeon: Yvonne Kendall, MD;  Location: [facility];  Service: Cardiovascular;  Laterality: N/A;   LUMBAR LAMINECTOMY/DECOMPRESSION MICRODISCECTOMY  03/20/2012   Procedure: LUMBAR LAMINECTOMY/DECOMPRESSION MICRODISCECTOMY;  Surgeon: Emilee Hero, MD;  Location: [facility] OR;  Service: Orthopedics;  Laterality: Left;  Left sided lumbar 4-5 microdisectomy   SPINE SURGERY  2013   Patient Active Problem List   Diagnosis Date Noted   Abdominal bloating 01/08/2023   Chronic idiopathic constipation 01/08/2023   Colon cancer screening 01/08/2023   Other long term (current) drug therapy 01/08/2023   Type 2 diabetes mellitus with hyperglycemia (HCC) 01/08/2023   Aortic atherosclerosis (HCC) 12/18/2022   Pain of right heel 12/18/2022   Stage 3a chronic kidney disease (HCC) 12/18/2022   Vulvar candidiasis 12/18/2022    Coronary artery disease of native artery of native heart with stable angina pectoris (HCC) 04/04/2022   Renal insufficiency 12/12/2021   Class 1 obesity due to excess calories with serious comorbidity and body mass index (BMI) of 30.0 to 30.9 in adult 12/12/2021   Type 2 diabetes mellitus with diabetic polyneuropathy, with long-term current use of insulin (HCC) 06/15/2021   Dyslipidemia 06/15/2021   Uncontrolled type 2 diabetes mellitus with hyperglycemia (HCC) 04/13/2019   Pneumonia due to COVID-19 virus 03/02/2019   Diabetes mellitus type 2 in obese 03/02/2019   Tension headache 11/13/2018   Cervical radiculopathy 11/13/2018   Stable angina (HCC) 07/10/2014   Abdominal pain 07/10/2014   Type II or unspecified type diabetes mellitus without mention of complication, uncontrolled 09/05/2013   Renal calculus, right 06/12/2013   Diabetes with proteinuria 11/05/2012   DDD (degenerative disc disease), lumbar 06/23/2012   Hyperlipidemia 11/27/2006   Essential hypertension 11/27/2006    PCP: Dorothyann Peng, MD   REFERRING PROVIDER: Felecia Shelling, DPM  REFERRING DIAG: M72.2 (ICD-10-CM) - Plantar fasciitis, bilateral   THERAPY DIAG:  Difficulty in walking, not elsewhere classified  Bilateral foot pain  Muscle weakness (generalized)  Localized edema  Abnormal posture  Rationale for Evaluation and Treatment: Rehabilitation  ONSET DATE: 02/05/23  SUBJECTIVE:   SUBJECTIVE STATEMENT:  Patient arrives with less limping today. She reports that  yesterday was bad after being on her feet at work x 5 hours on grassy surface.  PERTINENT HISTORY: Per referring physician note: 62 y.o. female presenting today for follow-up evaluation of plantar fasciitis bilateral over 1 year now.  Patient works on her feet in [employer] all day long.  She continues to have pain and tenderness.  She says the injections and the prednisone pack elevated her blood sugars drastically.  She continues to  take the meloxicam daily.  She also says that the plantar fascia braces were more bothersome to her feet and cause more pain.   PAIN:  Are you having pain? Yes: NPRS scale: 8/10 Pain location: B feet Pain description: burning, severe Aggravating factors: resting, standing for > 1 hour Relieving factors: Foot massages  PRECAUTIONS: None  WEIGHT BEARING RESTRICTIONS: No  FALLS:  Has patient fallen in last 6 months? No  LIVING ENVIRONMENT: Lives with: lives with their family Lives in: House/apartment Stairs: Yes: Internal: 14 steps; on right going up and External: 5 steps; none, has difficulty on some days due to pain, 1 step at a time. Has following equipment at home: None  OCCUPATION: Catering business, on feet all day  PLOF: Independent  PATIENT GOALS: Pain relief, be able to manage the pain.  NEXT MD VISIT: 3 months  OBJECTIVE:   DIAGNOSTIC FINDINGS:  Radiographic exam B/L feet 01/08/2023: Normal osseous mineralization. Joint spaces preserved. No fracture/dislocation/boney destruction. No other soft tissue abnormalities or radiopaque foreign bodies.  Plantar heel spurs noted bilateral   PATIENT SURVEYS:  FOTO 33.7  COGNITION: Overall cognitive status: Within functional limits for tasks assessed     SENSATION: Patient reports some tingling in R hip and LE  EDEMA:  Mild to mod swelling per patient  MUSCLE LENGTH: Hamstrings: Right 75 deg; Left 60 deg Thomas test: B hip flexors tight and painful beyond neutral.  POSTURE: rounded shoulders, decreased lumbar lordosis, and decreased thoracic kyphosis B pes planus, L > R. Tried to wear orthosis provided by Dr, but too painful. Wears Sketchers with some arch support built in when working.  PALPATION: B feet are TTP on ant calcaneus, R medial gastroc and L peroneus longus TP proximally  LOWER EXTREMITY ROM: B hips and knees generally WFL, B feet stiff, toe ext limited  Passive ROM Right eval Left eval  ACTIVE 03/08/23 RT/Left  Hip flexion     Hip extension     Hip abduction     Hip adduction     Hip internal rotation     Hip external rotation     Knee flexion      Knee extension     Ankle dorsiflexion 7 8 WFLS  Ankle plantarflexion     Ankle inversion     Ankle eversion      LOWER EXTREMITY MMT: B hips grossly 4-/5, knees 4/5, B ankles 3+, limited by pain.  GAIT: Distance walked: 79' Assistive device utilized: None Level of assistance: Modified independence Comments: Antalgic gait pattern with B LE, slow Wearing slides with inserts, but still very limited arch support   TODAY'S TREATMENT:  DATE:  03/16/23 Bike L3 x 6 min Active foot and ankle exercises- towel scrunches, Inversion/eversion on towel, great toe flex with ext of other toes, great toe ext Toe stretching into flex and ext. DF against G Tband resistance, 2 x 10 each leg STM deep stretching, scraping to B feet Soleus and gastroc stretch on steps Heel raises, knee straight and knees bent x 10 each.  03/14/23 Bike L3 x 6 minutes Deep STM with Theragun, with stretch for toe ext and ankle DF/toe flex and ankle PF Ankle strengthening in all planes, DF, PF, Inver, ever 10 reps each  03/09/23 Bike L 4 Black bar DF and PF 2 sets 10 Rocker board 20 x DF/PF Pro fit 3 x each leg 10 sec hold Dyna disc SLS 10 x 4 aways BIL Step ups with airex 10 x BIL Leg press calf raises 3 sets 10 Gastroc and soleous stretching on step 3 each 10 sec Green tband DF/PF 2 sets 10 Passive stretching BIL feet STM BIL feet  03/08/23 Rocker board 20 x DF/PF Pro fit 3 x each leg 10 sec hold SL dyna disc ROM 10 x 4 way Black bar DF and PF 2 sets 10 Green tband DF/PF 2 sets 10 Passive stretching BIL feet STM BIL feet Estim premod BIL PF  03/02/23 STM to BIL feet  Passive BIL stretch [place] BIL PF on a stretch  with firm pressure 6 min each foot Towel scrunches 2x10 SLS 30s Tandem standing 30s  Ankle TB DF/PF green band x10  Calf stretch 30s Calf raises 2x10  02/27/23 [place] BIL PF on a stretch with firm pressure 6 min each foot DTW BIL PF Stretching passively BIL PF DN RT PF by MAlbright PT Gave info from [employer] for PF sleeve to wear   02/15/23  Education  PATIENT EDUCATION:  Education details: POC Person educated: Patient Education method: Explanation Education comprehension: verbalized understanding  HOME EXERCISE PROGRAM: [id number]  ASSESSMENT:  CLINICAL IMPRESSION:  Patient arrives with decreased limp noted. She worked yesterday and was hurting bad, but appeared relieved today. Performed deep tissue mobilization with scraping and stretch.  OBJECTIVE IMPAIRMENTS: Abnormal gait, decreased activity tolerance, difficulty walking, decreased ROM, decreased strength, increased edema, impaired flexibility, postural dysfunction, and pain.   ACTIVITY LIMITATIONS: standing, squatting, stairs, transfers, and locomotion level  PARTICIPATION LIMITATIONS: meal prep, cleaning, laundry, shopping, community activity, and occupation  PERSONAL FACTORS: Past/current experiences are also affecting patient's functional outcome.   REHAB POTENTIAL: Good  CLINICAL DECISION MAKING: Evolving/moderate complexity  EVALUATION COMPLEXITY: Low   GOALS: Goals reviewed with patient? Yes  SHORT TERM GOALS: Target date: 03/01/23 I with initial HEP Baseline: Goal status: 03/08/23 MET LONG TERM GOALS: Target date: 04/26/23  I with final HEP Baseline:  Goal status: INITIAL  2.  Increase B DF PROM to > 20 degrees Baseline: 7R, 8L Goal status: 03/08/23 MET  3.  Patient will report 75% improvement in her pain in her feet overall Baseline: 10/10 Goal status: 03/14/23 no relief at this time. ongoing  4.  Patient will be able to walk up and down her steps in step over, with U rail, pain < 3/10 Baseline:  10/10 Goal status: INITIAL  5.  Patient will tolerate working a full shift at work of at least 2 hours with B foot pain < 3/10 Baseline: 10/10 Goal status: INITIAL PLAN:  PT FREQUENCY: 1-2x/week  PT DURATION: 10 weeks  PLANNED INTERVENTIONS: Therapeutic exercises, Therapeutic activity, Neuromuscular re-education, Balance training, Gait  training, Patient/Family education, Self Care, Joint mobilization, Stair training, Dry Needling, Electrical stimulation, Cryotherapy, Moist heat, Splintting, Taping, Vasopneumatic device, Ultrasound, Ionotophoresis 4mg /ml Dexamethasone, and Manual therapy  PLAN FOR NEXT SESSION: 03/16/23-How did she tolerate scraping?  Oley Balm DPT 03/16/23 8:43 AM   [facility] [address] [place], [place], [address] Phone: [phone]   Fax:  [phone]

## 2023-03-19 ENCOUNTER — Encounter: Payer: Self-pay | Admitting: Physical Therapy

## 2023-03-19 ENCOUNTER — Ambulatory Visit: Payer: BC Managed Care – PPO | Admitting: Physical Therapy

## 2023-03-19 DIAGNOSIS — R262 Difficulty in walking, not elsewhere classified: Secondary | ICD-10-CM

## 2023-03-19 DIAGNOSIS — M79671 Pain in right foot: Secondary | ICD-10-CM

## 2023-03-19 DIAGNOSIS — R600 Localized edema: Secondary | ICD-10-CM

## 2023-03-19 DIAGNOSIS — M6281 Muscle weakness (generalized): Secondary | ICD-10-CM

## 2023-03-19 NOTE — Therapy (Signed)
OUTPATIENT PHYSICAL THERAPY LOWER EXTREMITY    Patient Name: Teresa Grant MRN: [id number] DOB:1960-12-18, 62 y.o., female Today's Date: 03/19/2023  END OF SESSION:  PT End of Session - 03/19/23 1100     Visit Number 8    Date for PT Re-Evaluation 04/26/23    PT Start Time 1100    PT Stop Time 1145    PT Time Calculation (min) 45 min    Activity Tolerance Patient tolerated treatment well    Behavior During Therapy [facility] for tasks assessed/performed              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   Diabetes mellitus    Dysrhythmia    1985   Heart murmur    HLD (hyperlipidemia)    Hypertension    Kidney stone on right side 03/2013   Pneumonia    7 yrs ago   Past Surgical History:  Procedure Laterality Date   ABDOMINAL HYSTERECTOMY  2001   BACK SURGERY     CERVICAL SPINE SURGERY  06/06/2019   Dr. Franky Macho, performed at surgical center   CESAREAN SECTION  1990   COLONOSCOPY     Dr. Arlyce Dice normal   LEFT HEART CATH AND CORONARY ANGIOGRAPHY N/A 11/03/2021   Procedure: LEFT HEART CATH AND CORONARY ANGIOGRAPHY;  Surgeon: Yvonne Kendall, MD;  Location: [facility];  Service: Cardiovascular;  Laterality: N/A;   LUMBAR LAMINECTOMY/DECOMPRESSION MICRODISCECTOMY  03/20/2012   Procedure: LUMBAR LAMINECTOMY/DECOMPRESSION MICRODISCECTOMY;  Surgeon: Emilee Hero, MD;  Location: [facility] OR;  Service: Orthopedics;  Laterality: Left;  Left sided lumbar 4-5 microdisectomy   SPINE SURGERY  2013   Patient Active Problem List   Diagnosis Date Noted   Abdominal bloating 01/08/2023   Chronic idiopathic constipation 01/08/2023   Colon cancer screening 01/08/2023   Other long term (current) drug therapy 01/08/2023   Type 2 diabetes mellitus with hyperglycemia (HCC) 01/08/2023   Aortic atherosclerosis (HCC) 12/18/2022   Pain of right heel 12/18/2022   Stage 3a chronic kidney disease (HCC) 12/18/2022   Vulvar candidiasis 12/18/2022    Coronary artery disease of native artery of native heart with stable angina pectoris (HCC) 04/04/2022   Renal insufficiency 12/12/2021   Class 1 obesity due to excess calories with serious comorbidity and body mass index (BMI) of 30.0 to 30.9 in adult 12/12/2021   Type 2 diabetes mellitus with diabetic polyneuropathy, with long-term current use of insulin (HCC) 06/15/2021   Dyslipidemia 06/15/2021   Uncontrolled type 2 diabetes mellitus with hyperglycemia (HCC) 04/13/2019   Pneumonia due to COVID-19 virus 03/02/2019   Diabetes mellitus type 2 in obese 03/02/2019   Tension headache 11/13/2018   Cervical radiculopathy 11/13/2018   Stable angina (HCC) 07/10/2014   Abdominal pain 07/10/2014   Type II or unspecified type diabetes mellitus without mention of complication, uncontrolled 09/05/2013   Renal calculus, right 06/12/2013   Diabetes with proteinuria 11/05/2012   DDD (degenerative disc disease), lumbar 06/23/2012   Hyperlipidemia 11/27/2006   Essential hypertension 11/27/2006    PCP: Dorothyann Peng, MD   REFERRING PROVIDER: Felecia Shelling, DPM  REFERRING DIAG: M72.2 (ICD-10-CM) - Plantar fasciitis, bilateral   THERAPY DIAG:  Difficulty in walking, not elsewhere classified  Bilateral foot pain  Muscle weakness (generalized)  Localized edema  Rationale for Evaluation and Treatment: Rehabilitation  ONSET DATE: 02/05/23  SUBJECTIVE:   SUBJECTIVE STATEMENT:  Patient rated her R heel pain a 5/10 when walking or sitting  more than 5 mins. Does HEP and it helps. Patient said she is tired today. Patient has to alter her daily lifestyle to avoid stairs as much as possible due to pain.  PERTINENT HISTORY: Per referring physician note: 62 y.o. female presenting today for follow-up evaluation of plantar fasciitis bilateral over 1 year now.  Patient works on her feet in [employer] all day long.  She continues to have pain and tenderness.  She says the injections and the  prednisone pack elevated her blood sugars drastically.  She continues to take the meloxicam daily.  She also says that the plantar fascia braces were more bothersome to her feet and cause more pain.   PAIN:  Are you having pain? Yes: NPRS scale: 7/10 Pain location: B feet Pain description: burning, severe Aggravating factors: resting, standing for > 1 hour Relieving factors: Foot massages  PRECAUTIONS: None  WEIGHT BEARING RESTRICTIONS: No  FALLS:  Has patient fallen in last 6 months? No  LIVING ENVIRONMENT: Lives with: lives with their family Lives in: House/apartment Stairs: Yes: Internal: 14 steps; on right going up and External: 5 steps; none, has difficulty on some days due to pain, 1 step at a time. Has following equipment at home: None  OCCUPATION: Catering business, on feet all day  PLOF: Independent  PATIENT GOALS: Pain relief, be able to manage the pain.  NEXT MD VISIT: 3 months  OBJECTIVE:   DIAGNOSTIC FINDINGS:  Radiographic exam B/L feet 01/08/2023: Normal osseous mineralization. Joint spaces preserved. No fracture/dislocation/boney destruction. No other soft tissue abnormalities or radiopaque foreign bodies.  Plantar heel spurs noted bilateral   PATIENT SURVEYS:  FOTO 33.7  COGNITION: Overall cognitive status: Within functional limits for tasks assessed     SENSATION: Patient reports some tingling in R hip and LE  EDEMA:  Mild to mod swelling per patient  MUSCLE LENGTH: Hamstrings: Right 75 deg; Left 60 deg Thomas test: B hip flexors tight and painful beyond neutral.  POSTURE: rounded shoulders, decreased lumbar lordosis, and decreased thoracic kyphosis B pes planus, L > R. Tried to wear orthosis provided by Dr, but too painful. Wears Sketchers with some arch support built in when working.  PALPATION: B feet are TTP on ant calcaneus, R medial gastroc and L peroneus longus TP proximally  LOWER EXTREMITY ROM: B hips and knees generally WFL, B feet  stiff, toe ext limited  Passive ROM Right eval Left eval ACTIVE 03/08/23 RT/Left  Hip flexion     Hip extension     Hip abduction     Hip adduction     Hip internal rotation     Hip external rotation     Knee flexion      Knee extension     Ankle dorsiflexion 7 8 WFLS  Ankle plantarflexion     Ankle inversion     Ankle eversion      LOWER EXTREMITY MMT: B hips grossly 4-/5, knees 4/5, B ankles 3+, limited by pain.  GAIT: Distance walked: 74' Assistive device utilized: None Level of assistance: Modified independence Comments: Antalgic gait pattern with B LE, slow Wearing slides with inserts, but still very limited arch support   TODAY'S TREATMENT:  DATE:  03/19/23 Bike L4.5 x 6 min Standing PF and DF with black bar 2x10 Standing AROM on fitness gear PF/DF/inversion/eversion 2x10 Airex pad step forward/backward/left/right B x10 Standing plantar flexor stretch on board and steps  Step ups on 6 in x10 Seated strengthening B PF/DF/inversion/eversion red t-band x10 Seated tennis ball rolls with B feet ~3 mins each   03/16/23 Bike L3 x 6 min Active foot and ankle exercises- towel scrunches, Inversion/eversion on towel, great toe flex with ext of other toes, great toe ext Toe stretching into flex and ext. DF against G Tband resistance, 2 x 10 each leg STM deep stretching, scraping to B feet Soleus and gastroc stretch on steps Heel raises, knee straight and knees bent x 10 each.  03/14/23 Bike L3 x 6 minutes Deep STM with Theragun, with stretch for toe ext and ankle DF/toe flex and ankle PF Ankle strengthening in all planes, DF, PF, Inver, ever 10 reps each  03/09/23 Bike L 4 Black bar DF and PF 2 sets 10 Rocker board 20 x DF/PF Pro fit 3 x each leg 10 sec hold Dyna disc SLS 10 x 4 aways BIL Step ups with airex 10 x BIL Leg press calf  raises 3 sets 10 Gastroc and soleous stretching on step 3 each 10 sec Green tband DF/PF 2 sets 10 Passive stretching BIL feet STM BIL feet  03/08/23 Rocker board 20 x DF/PF Pro fit 3 x each leg 10 sec hold SL dyna disc ROM 10 x 4 way Black bar DF and PF 2 sets 10 Green tband DF/PF 2 sets 10 Passive stretching BIL feet STM BIL feet Estim premod BIL PF  03/02/23 STM to BIL feet  Passive BIL stretch [place] BIL PF on a stretch with firm pressure 6 min each foot Towel scrunches 2x10 SLS 30s Tandem standing 30s  Ankle TB DF/PF green band x10  Calf stretch 30s Calf raises 2x10  02/27/23 [place] BIL PF on a stretch with firm pressure 6 min each foot DTW BIL PF Stretching passively BIL PF DN RT PF by MAlbright PT Gave info from [employer] for PF sleeve to wear   02/15/23  Education  PATIENT EDUCATION:  Education details: POC Person educated: Patient Education method: Explanation Education comprehension: verbalized understanding  HOME EXERCISE PROGRAM: [id number]  ASSESSMENT:  CLINICAL IMPRESSION:  Patient had pain in B feet, but more in right. Pain and tightness is specific to the heels and she avoids walking on her heels due to it. Treatment was focused on ROM and stretching and patient was taught how to mange pain at work. She tolerated treatment well and was educated on stretching at home with stairs and tennis ball.  OBJECTIVE IMPAIRMENTS: Abnormal gait, decreased activity tolerance, difficulty walking, decreased ROM, decreased strength, increased edema, impaired flexibility, postural dysfunction, and pain.   ACTIVITY LIMITATIONS: standing, squatting, stairs, transfers, and locomotion level  PARTICIPATION LIMITATIONS: meal prep, cleaning, laundry, shopping, community activity, and occupation  PERSONAL FACTORS: Past/current experiences are also affecting patient's functional outcome.   REHAB POTENTIAL: Good  CLINICAL DECISION MAKING: Evolving/moderate complexity  EVALUATION  COMPLEXITY: Low   GOALS: Goals reviewed with patient? Yes  SHORT TERM GOALS: Target date: 03/01/23 I with initial HEP Baseline: Goal status: 03/08/23 MET LONG TERM GOALS: Target date: 04/26/23  I with final HEP Baseline:  Goal status: INITIAL  2.  Increase B DF PROM to > 20 degrees Baseline: 7R, 8L Goal status: 03/08/23 MET  3.  Patient will report 75%  improvement in her pain in her feet overall Baseline: 10/10 Goal status: 03/14/23 no relief at this time. ongoing  4.  Patient will be able to walk up and down her steps in step over, with U rail, pain < 3/10 Baseline: 10/10 Goal status: INITIAL  5.  Patient will tolerate working a full shift at work of at least 2 hours with B foot pain < 3/10 Baseline: 10/10 Goal status: INITIAL PLAN:  PT FREQUENCY: 1-2x/week  PT DURATION: 10 weeks  PLANNED INTERVENTIONS: Therapeutic exercises, Therapeutic activity, Neuromuscular re-education, Balance training, Gait training, Patient/Family education, Self Care, Joint mobilization, Stair training, Dry Needling, Electrical stimulation, Cryotherapy, Moist heat, Splintting, Taping, Vasopneumatic device, Ultrasound, Ionotophoresis 4mg /ml Dexamethasone, and Manual therapy  PLAN FOR NEXT SESSION: 03/16/23-How did she tolerate scraping?  Oley Balm DPT 03/19/23 11:01 AM     Outpatient Rehabilitation at [facility] [address]. [place], [place], [address] Phone: [phone]   Fax:  [phone]

## 2023-03-20 ENCOUNTER — Ambulatory Visit: Payer: BC Managed Care – PPO | Admitting: Physical Therapy

## 2023-03-22 ENCOUNTER — Encounter: Payer: Self-pay | Admitting: Physical Therapy

## 2023-03-22 ENCOUNTER — Ambulatory Visit: Payer: BC Managed Care – PPO | Admitting: Physical Therapy

## 2023-03-22 DIAGNOSIS — R262 Difficulty in walking, not elsewhere classified: Secondary | ICD-10-CM

## 2023-03-22 DIAGNOSIS — M79671 Pain in right foot: Secondary | ICD-10-CM

## 2023-03-22 DIAGNOSIS — M6281 Muscle weakness (generalized): Secondary | ICD-10-CM

## 2023-03-22 DIAGNOSIS — R600 Localized edema: Secondary | ICD-10-CM

## 2023-03-22 DIAGNOSIS — R293 Abnormal posture: Secondary | ICD-10-CM

## 2023-03-22 NOTE — Therapy (Signed)
OUTPATIENT PHYSICAL THERAPY LOWER EXTREMITY    Patient Name: Teresa Grant MRN: [id number] DOB:1961/04/16, 62 y.o., female Today's Date: 03/22/2023  END OF SESSION:  PT End of Session - 03/22/23 0758     Visit Number 9    Date for PT Re-Evaluation 04/26/23    PT Start Time 0757    PT Stop Time 0838    PT Time Calculation (min) 41 min    Activity Tolerance Patient tolerated treatment well    Behavior During Therapy [facility] for tasks assessed/performed              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   Diabetes mellitus    Dysrhythmia    1985   Heart murmur    HLD (hyperlipidemia)    Hypertension    Kidney stone on right side 03/2013   Pneumonia    7 yrs ago   Past Surgical History:  Procedure Laterality Date   ABDOMINAL HYSTERECTOMY  2001   BACK SURGERY     CERVICAL SPINE SURGERY  06/06/2019   Dr. Franky Macho, performed at surgical center   CESAREAN SECTION  1990   COLONOSCOPY     Dr. Arlyce Dice normal   LEFT HEART CATH AND CORONARY ANGIOGRAPHY N/A 11/03/2021   Procedure: LEFT HEART CATH AND CORONARY ANGIOGRAPHY;  Surgeon: Yvonne Kendall, MD;  Location: [facility];  Service: Cardiovascular;  Laterality: N/A;   LUMBAR LAMINECTOMY/DECOMPRESSION MICRODISCECTOMY  03/20/2012   Procedure: LUMBAR LAMINECTOMY/DECOMPRESSION MICRODISCECTOMY;  Surgeon: Emilee Hero, MD;  Location: [facility] OR;  Service: Orthopedics;  Laterality: Left;  Left sided lumbar 4-5 microdisectomy   SPINE SURGERY  2013   Patient Active Problem List   Diagnosis Date Noted   Abdominal bloating 01/08/2023   Chronic idiopathic constipation 01/08/2023   Colon cancer screening 01/08/2023   Other long term (current) drug therapy 01/08/2023   Type 2 diabetes mellitus with hyperglycemia (HCC) 01/08/2023   Aortic atherosclerosis (HCC) 12/18/2022   Pain of right heel 12/18/2022   Stage 3a chronic kidney disease (HCC) 12/18/2022   Vulvar candidiasis 12/18/2022    Coronary artery disease of native artery of native heart with stable angina pectoris (HCC) 04/04/2022   Renal insufficiency 12/12/2021   Class 1 obesity due to excess calories with serious comorbidity and body mass index (BMI) of 30.0 to 30.9 in adult 12/12/2021   Type 2 diabetes mellitus with diabetic polyneuropathy, with long-term current use of insulin (HCC) 06/15/2021   Dyslipidemia 06/15/2021   Uncontrolled type 2 diabetes mellitus with hyperglycemia (HCC) 04/13/2019   Pneumonia due to COVID-19 virus 03/02/2019   Diabetes mellitus type 2 in obese 03/02/2019   Tension headache 11/13/2018   Cervical radiculopathy 11/13/2018   Stable angina (HCC) 07/10/2014   Abdominal pain 07/10/2014   Type II or unspecified type diabetes mellitus without mention of complication, uncontrolled 09/05/2013   Renal calculus, right 06/12/2013   Diabetes with proteinuria 11/05/2012   DDD (degenerative disc disease), lumbar 06/23/2012   Hyperlipidemia 11/27/2006   Essential hypertension 11/27/2006    PCP: Dorothyann Peng, MD   REFERRING PROVIDER: Felecia Shelling, DPM  REFERRING DIAG: M72.2 (ICD-10-CM) - Plantar fasciitis, bilateral   THERAPY DIAG:  Difficulty in walking, not elsewhere classified  Bilateral foot pain  Muscle weakness (generalized)  Localized edema  Abnormal posture  Rationale for Evaluation and Treatment: Rehabilitation  ONSET DATE: 02/05/23  SUBJECTIVE:   SUBJECTIVE STATEMENT:  Patient reports that her work is severely impacting her  pain.   PERTINENT HISTORY: Per referring physician note: 62 y.o. female presenting today for follow-up evaluation of plantar fasciitis bilateral over 1 year now.  Patient works on her feet in [employer] all day long.  She continues to have pain and tenderness.  She says the injections and the prednisone pack elevated her blood sugars drastically.  She continues to take the meloxicam daily.  She also says that the plantar fascia braces  were more bothersome to her feet and cause more pain.   PAIN:  Are you having pain? Yes: NPRS scale: 7/10 Pain location: B feet Pain description: burning, severe Aggravating factors: resting, standing for > 1 hour Relieving factors: Foot massages  PRECAUTIONS: None  WEIGHT BEARING RESTRICTIONS: No  FALLS:  Has patient fallen in last 6 months? No  LIVING ENVIRONMENT: Lives with: lives with their family Lives in: House/apartment Stairs: Yes: Internal: 14 steps; on right going up and External: 5 steps; none, has difficulty on some days due to pain, 1 step at a time. Has following equipment at home: None  OCCUPATION: Catering business, on feet all day  PLOF: Independent  PATIENT GOALS: Pain relief, be able to manage the pain.  NEXT MD VISIT: 3 months  OBJECTIVE:   DIAGNOSTIC FINDINGS:  Radiographic exam B/L feet 01/08/2023: Normal osseous mineralization. Joint spaces preserved. No fracture/dislocation/boney destruction. No other soft tissue abnormalities or radiopaque foreign bodies.  Plantar heel spurs noted bilateral   PATIENT SURVEYS:  FOTO 33.7  COGNITION: Overall cognitive status: Within functional limits for tasks assessed     SENSATION: Patient reports some tingling in R hip and LE  EDEMA:  Mild to mod swelling per patient  MUSCLE LENGTH: Hamstrings: Right 75 deg; Left 60 deg Thomas test: B hip flexors tight and painful beyond neutral.  POSTURE: rounded shoulders, decreased lumbar lordosis, and decreased thoracic kyphosis B pes planus, L > R. Tried to wear orthosis provided by Dr, but too painful. Wears Sketchers with some arch support built in when working.  PALPATION: B feet are TTP on ant calcaneus, R medial gastroc and L peroneus longus TP proximally  LOWER EXTREMITY ROM: B hips and knees generally WFL, B feet stiff, toe ext limited  Passive ROM Right eval Left eval ACTIVE 03/08/23 RT/Left  Hip flexion     Hip extension     Hip abduction      Hip adduction     Hip internal rotation     Hip external rotation     Knee flexion      Knee extension     Ankle dorsiflexion 7 8 WFLS  Ankle plantarflexion     Ankle inversion     Ankle eversion      LOWER EXTREMITY MMT: B hips grossly 4-/5, knees 4/5, B ankles 3+, limited by pain.  GAIT: Distance walked: 15' Assistive device utilized: None Level of assistance: Modified independence Comments: Antalgic gait pattern with B LE, slow Wearing slides with inserts, but still very limited arch support   TODAY'S TREATMENT:  DATE:  03/22/23 NuStep L5 x 5 minutes Deep STM to B calves, both gastroc and soleus Scraping to B PF from heel toward forefoot B calf stretch on step, gastroc and soleus Supine clamshells, bridge with abd resistance x 10 each CP applied to plantar surface of B feet x 10 minutes.  03/19/23 Bike L4.5 x 6 min Standing PF and DF with black bar 2x10 Standing AROM on fitness gear PF/DF/inversion/eversion 2x10 Airex pad step forward/backward/left/right B x10 Standing plantar flexor stretch on board and steps  Step ups on 6 in x10 Seated strengthening B PF/DF/inversion/eversion red t-band x10 Seated tennis ball rolls with B feet ~3 mins each  03/16/23 Bike L3 x 6 min Active foot and ankle exercises- towel scrunches, Inversion/eversion on towel, great toe flex with ext of other toes, great toe ext Toe stretching into flex and ext. DF against G Tband resistance, 2 x 10 each leg STM deep stretching, scraping to B feet Soleus and gastroc stretch on steps Heel raises, knee straight and knees bent x 10 each.  03/14/23 Bike L3 x 6 minutes Deep STM with Theragun, with stretch for toe ext and ankle DF/toe flex and ankle PF Ankle strengthening in all planes, DF, PF, Inver, ever 10 reps each  03/09/23 Bike L 4 Black bar DF and PF 2 sets  10 Rocker board 20 x DF/PF Pro fit 3 x each leg 10 sec hold Dyna disc SLS 10 x 4 aways BIL Step ups with airex 10 x BIL Leg press calf raises 3 sets 10 Gastroc and soleous stretching on step 3 each 10 sec Green tband DF/PF 2 sets 10 Passive stretching BIL feet STM BIL feet  03/08/23 Rocker board 20 x DF/PF Pro fit 3 x each leg 10 sec hold SL dyna disc ROM 10 x 4 way Black bar DF and PF 2 sets 10 Green tband DF/PF 2 sets 10 Passive stretching BIL feet STM BIL feet Estim premod BIL PF  03/02/23 STM to BIL feet  Passive BIL stretch [place] BIL PF on a stretch with firm pressure 6 min each foot Towel scrunches 2x10 SLS 30s Tandem standing 30s  Ankle TB DF/PF green band x10  Calf stretch 30s Calf raises 2x10  02/27/23 [place] BIL PF on a stretch with firm pressure 6 min each foot DTW BIL PF Stretching passively BIL PF DN RT PF by MAlbright PT Gave info from [employer] for PF sleeve to wear   02/15/23  Education  PATIENT EDUCATION:  Education details: POC Person educated: Patient Education method: Explanation Education comprehension: verbalized understanding  HOME EXERCISE PROGRAM: [id number]  ASSESSMENT:  CLINICAL IMPRESSION:  Patient reports severe pain with ever shift she works due to long hours on her feet. Recommended compression socks during work as she notes swelling in her lower legs. Continued with STM for feet and also calves. Moved to stretching and added some hip strengthening for stability.  OBJECTIVE IMPAIRMENTS: Abnormal gait, decreased activity tolerance, difficulty walking, decreased ROM, decreased strength, increased edema, impaired flexibility, postural dysfunction, and pain.   ACTIVITY LIMITATIONS: standing, squatting, stairs, transfers, and locomotion level  PARTICIPATION LIMITATIONS: meal prep, cleaning, laundry, shopping, community activity, and occupation  PERSONAL FACTORS: Past/current experiences are also affecting patient's functional outcome.   REHAB  POTENTIAL: Good  CLINICAL DECISION MAKING: Evolving/moderate complexity  EVALUATION COMPLEXITY: Low   GOALS: Goals reviewed with patient? Yes  SHORT TERM GOALS: Target date: 03/01/23 I with initial HEP Baseline: Goal status: 03/08/23 MET LONG TERM GOALS: Target  date: 04/26/23  I with final HEP Baseline:  Goal status: INITIAL  2.  Increase B DF PROM to > 20 degrees Baseline: 7R, 8L Goal status: 03/08/23 MET  3.  Patient will report 75% improvement in her pain in her feet overall Baseline: 10/10 Goal status: 03/14/23 no relief at this time. ongoing  4.  Patient will be able to walk up and down her steps in step over, with U rail, pain < 3/10 Baseline: 10/10 Goal status: INITIAL  5.  Patient will tolerate working a full shift at work of at least 2 hours with B foot pain < 3/10 Baseline: 10/10 Goal status: INITIAL PLAN:  PT FREQUENCY: 1-2x/week  PT DURATION: 10 weeks  PLANNED INTERVENTIONS: Therapeutic exercises, Therapeutic activity, Neuromuscular re-education, Balance training, Gait training, Patient/Family education, Self Care, Joint mobilization, Stair training, Dry Needling, Electrical stimulation, Cryotherapy, Moist heat, Splintting, Taping, Vasopneumatic device, Ultrasound, Ionotophoresis 4mg /ml Dexamethasone, and Manual therapy  PLAN FOR NEXT SESSION: 03/16/23-How did she tolerate scraping?  Oley Balm DPT 03/22/23 8:45 AM   [facility] [address] [place], [place], [address] Phone: [phone]   Fax:  [phone]

## 2023-03-27 ENCOUNTER — Ambulatory Visit: Payer: BC Managed Care – PPO | Attending: Podiatry | Admitting: Physical Therapy

## 2023-03-27 DIAGNOSIS — M79671 Pain in right foot: Secondary | ICD-10-CM | POA: Insufficient documentation

## 2023-03-27 DIAGNOSIS — R293 Abnormal posture: Secondary | ICD-10-CM | POA: Diagnosis present

## 2023-03-27 DIAGNOSIS — M6281 Muscle weakness (generalized): Secondary | ICD-10-CM | POA: Diagnosis present

## 2023-03-27 DIAGNOSIS — R600 Localized edema: Secondary | ICD-10-CM | POA: Diagnosis present

## 2023-03-27 DIAGNOSIS — M79672 Pain in left foot: Secondary | ICD-10-CM | POA: Insufficient documentation

## 2023-03-27 DIAGNOSIS — R262 Difficulty in walking, not elsewhere classified: Secondary | ICD-10-CM | POA: Insufficient documentation

## 2023-03-27 NOTE — Therapy (Signed)
OUTPATIENT PHYSICAL THERAPY LOWER EXTREMITY    Patient Name: Teresa Grant MRN: [id number] DOB:1960-11-03, 62 y.o., female Today's Date: 03/27/2023  END OF SESSION:  PT End of Session - 03/27/23 0955     Visit Number 10    Date for PT Re-Evaluation 04/26/23    PT Start Time 1000    PT Stop Time 1045    PT Time Calculation (min) 45 min    Activity Tolerance Patient tolerated treatment well    Behavior During Therapy [facility] for tasks assessed/performed              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   Diabetes mellitus    Dysrhythmia    1985   Heart murmur    HLD (hyperlipidemia)    Hypertension    Kidney stone on right side 03/2013   Pneumonia    7 yrs ago   Past Surgical History:  Procedure Laterality Date   ABDOMINAL HYSTERECTOMY  2001   BACK SURGERY     CERVICAL SPINE SURGERY  06/06/2019   Dr. Franky Macho, performed at surgical center   CESAREAN SECTION  1990   COLONOSCOPY     Dr. Arlyce Dice normal   LEFT HEART CATH AND CORONARY ANGIOGRAPHY N/A 11/03/2021   Procedure: LEFT HEART CATH AND CORONARY ANGIOGRAPHY;  Surgeon: Yvonne Kendall, MD;  Location: [facility];  Service: Cardiovascular;  Laterality: N/A;   LUMBAR LAMINECTOMY/DECOMPRESSION MICRODISCECTOMY  03/20/2012   Procedure: LUMBAR LAMINECTOMY/DECOMPRESSION MICRODISCECTOMY;  Surgeon: Emilee Hero, MD;  Location: [facility] OR;  Service: Orthopedics;  Laterality: Left;  Left sided lumbar 4-5 microdisectomy   SPINE SURGERY  2013   Patient Active Problem List   Diagnosis Date Noted   Abdominal bloating 01/08/2023   Chronic idiopathic constipation 01/08/2023   Colon cancer screening 01/08/2023   Other long term (current) drug therapy 01/08/2023   Type 2 diabetes mellitus with hyperglycemia (HCC) 01/08/2023   Aortic atherosclerosis (HCC) 12/18/2022   Pain of right heel 12/18/2022   Stage 3a chronic kidney disease (HCC) 12/18/2022   Vulvar candidiasis 12/18/2022    Coronary artery disease of native artery of native heart with stable angina pectoris (HCC) 04/04/2022   Renal insufficiency 12/12/2021   Class 1 obesity due to excess calories with serious comorbidity and body mass index (BMI) of 30.0 to 30.9 in adult 12/12/2021   Type 2 diabetes mellitus with diabetic polyneuropathy, with long-term current use of insulin (HCC) 06/15/2021   Dyslipidemia 06/15/2021   Uncontrolled type 2 diabetes mellitus with hyperglycemia (HCC) 04/13/2019   Pneumonia due to COVID-19 virus 03/02/2019   Diabetes mellitus type 2 in obese 03/02/2019   Tension headache 11/13/2018   Cervical radiculopathy 11/13/2018   Stable angina (HCC) 07/10/2014   Abdominal pain 07/10/2014   Type II or unspecified type diabetes mellitus without mention of complication, uncontrolled 09/05/2013   Renal calculus, right 06/12/2013   Diabetes with proteinuria 11/05/2012   DDD (degenerative disc disease), lumbar 06/23/2012   Hyperlipidemia 11/27/2006   Essential hypertension 11/27/2006    PCP: Dorothyann Peng, MD   REFERRING PROVIDER: Felecia Shelling, DPM  REFERRING DIAG: M72.2 (ICD-10-CM) - Plantar fasciitis, bilateral   THERAPY DIAG:  Difficulty in walking, not elsewhere classified  Bilateral foot pain  Muscle weakness (generalized)  Rationale for Evaluation and Treatment: Rehabilitation  ONSET DATE: 02/05/23  SUBJECTIVE:   SUBJECTIVE STATEMENT:  That day was okay (scraping). Normal pain when I first get up  PERTINENT HISTORY:  Per referring physician note: 62 y.o. female presenting today for follow-up evaluation of plantar fasciitis bilateral over 1 year now.  Patient works on her feet in [employer] all day long.  She continues to have pain and tenderness.  She says the injections and the prednisone pack elevated her blood sugars drastically.  She continues to take the meloxicam daily.  She also says that the plantar fascia braces were more bothersome to her feet and  cause more pain.   PAIN:  Are you having pain? Yes: NPRS scale: 6/10 Pain location: B feet Pain description: burning, severe Aggravating factors: resting, standing for > 1 hour Relieving factors: Foot massages  PRECAUTIONS: None  WEIGHT BEARING RESTRICTIONS: No  FALLS:  Has patient fallen in last 6 months? No  LIVING ENVIRONMENT: Lives with: lives with their family Lives in: House/apartment Stairs: Yes: Internal: 14 steps; on right going up and External: 5 steps; none, has difficulty on some days due to pain, 1 step at a time. Has following equipment at home: None  OCCUPATION: Catering business, on feet all day  PLOF: Independent  PATIENT GOALS: Pain relief, be able to manage the pain.  NEXT MD VISIT: 3 months  OBJECTIVE:   DIAGNOSTIC FINDINGS:  Radiographic exam B/L feet 01/08/2023: Normal osseous mineralization. Joint spaces preserved. No fracture/dislocation/boney destruction. No other soft tissue abnormalities or radiopaque foreign bodies.  Plantar heel spurs noted bilateral   PATIENT SURVEYS:  FOTO 33.7  COGNITION: Overall cognitive status: Within functional limits for tasks assessed     SENSATION: Patient reports some tingling in R hip and LE  EDEMA:  Mild to mod swelling per patient  MUSCLE LENGTH: Hamstrings: Right 75 deg; Left 60 deg Thomas test: B hip flexors tight and painful beyond neutral.  POSTURE: rounded shoulders, decreased lumbar lordosis, and decreased thoracic kyphosis B pes planus, L > R. Tried to wear orthosis provided by Dr, but too painful. Wears Sketchers with some arch support built in when working.  PALPATION: B feet are TTP on ant calcaneus, R medial gastroc and L peroneus longus TP proximally  LOWER EXTREMITY ROM: B hips and knees generally WFL, B feet stiff, toe ext limited  Passive ROM Right eval Left eval ACTIVE 03/08/23 RT/Left  Hip flexion     Hip extension     Hip abduction     Hip adduction     Hip internal  rotation     Hip external rotation     Knee flexion      Knee extension     Ankle dorsiflexion 7 8 WFLS  Ankle plantarflexion     Ankle inversion     Ankle eversion      LOWER EXTREMITY MMT: B hips grossly 4-/5, knees 4/5, B ankles 3+, limited by pain.  GAIT: Distance walked: 57' Assistive device utilized: None Level of assistance: Modified independence Comments: Antalgic gait pattern with B LE, slow Wearing slides with inserts, but still very limited arch support   TODAY'S TREATMENT:  DATE:  03/27/23 Nustep vl 6 min Ankle ROM on fitness board all motions 2x10 Stretch B PF on steps x4 Ankle 4 way strengthening sitting with green t-band STM to B fet with scraping CP applied to plantar surface of B feet in supine x7 mins  03/22/23 NuStep L5 x 5 minutes Deep STM to B calves, both gastroc and soleus Scraping to B PF from heel toward forefoot B calf stretch on step, gastroc and soleus Supine clamshells, bridge with abd resistance x 10 each CP applied to plantar surface of B feet x 10 minutes.  03/19/23 Bike L4.5 x 6 min Standing PF and DF with black bar 2x10 Standing AROM on fitness gear PF/DF/inversion/eversion 2x10 Airex pad step forward/backward/left/right B x10 Standing plantar flexor stretch on board and steps  Step ups on 6 in x10 Seated strengthening B PF/DF/inversion/eversion red t-band x10 Seated tennis ball rolls with B feet ~3 mins each  03/16/23 Bike L3 x 6 min Active foot and ankle exercises- towel scrunches, Inversion/eversion on towel, great toe flex with ext of other toes, great toe ext Toe stretching into flex and ext. DF against G Tband resistance, 2 x 10 each leg STM deep stretching, scraping to B feet Soleus and gastroc stretch on steps Heel raises, knee straight and knees bent x 10 each.  03/14/23 Bike L3 x 6 minutes Deep STM  with Theragun, with stretch for toe ext and ankle DF/toe flex and ankle PF Ankle strengthening in all planes, DF, PF, Inver, ever 10 reps each    03/09/23 Bike L 4 Black bar DF and PF 2 sets 10 Rocker board 20 x DF/PF Pro fit 3 x each leg 10 sec hold Dyna disc SLS 10 x 4 aways BIL Step ups with airex 10 x BIL Leg press calf raises 3 sets 10 Gastroc and soleous stretching on step 3 each 10 sec Green tband DF/PF 2 sets 10 Passive stretching BIL feet STM BIL feet  03/08/23 Rocker board 20 x DF/PF Pro fit 3 x each leg 10 sec hold SL dyna disc ROM 10 x 4 way Black bar DF and PF 2 sets 10 Green tband DF/PF 2 sets 10 Passive stretching BIL feet STM BIL feet Estim premod BIL PF  03/02/23 STM to BIL feet  Passive BIL stretch [place] BIL PF on a stretch with firm pressure 6 min each foot Towel scrunches 2x10 SLS 30s Tandem standing 30s  Ankle TB DF/PF green band x10  Calf stretch 30s Calf raises 2x10  02/27/23 [place] BIL PF on a stretch with firm pressure 6 min each foot DTW BIL PF Stretching passively BIL PF DN RT PF by MAlbright PT Gave info from [employer] for PF sleeve to wear   02/15/23  Education  PATIENT EDUCATION:  Education details: POC Person educated: Patient Education method: Explanation Education comprehension: verbalized understanding  HOME EXERCISE PROGRAM: [email or web address]  ASSESSMENT:  CLINICAL IMPRESSION:  Patient tolerated scraping well and is going to get compression socks next paycheck. She is considering trying to find work somewhere else that doesn't require her to be on her feet as much. She goes to the foot doctor sometime this month. Treatment focused on ankle ROM and strengthening alone with manual scraping to the plantar surface of feet. She tolerated treatment well and we applied ice at the end of session because the pt said it helped with her pain after last session. She would benefit from continued PT in order to get back to work with minimal  pain.  OBJECTIVE IMPAIRMENTS: Abnormal gait, decreased activity tolerance, difficulty walking, decreased ROM, decreased strength, increased edema, impaired flexibility, postural dysfunction, and pain.   ACTIVITY LIMITATIONS: standing, squatting, stairs, transfers, and locomotion level  PARTICIPATION LIMITATIONS: meal prep, cleaning, laundry, shopping, community activity, and occupation  PERSONAL FACTORS: Past/current experiences are also affecting patient's functional outcome.   REHAB POTENTIAL: Good  CLINICAL DECISION MAKING: Evolving/moderate complexity  EVALUATION COMPLEXITY: Low   GOALS: Goals reviewed with patient? Yes  SHORT TERM GOALS: Target date: 03/01/23 I with initial HEP Baseline: Goal status: 03/08/23 MET LONG TERM GOALS: Target date: 04/26/23  I with final HEP Baseline:  Goal status: INITIAL  2.  Increase B DF PROM to > 20 degrees Baseline: 7R, 8L Goal status: 03/08/23 MET  3.  Patient will report 75% improvement in her pain in her feet overall Baseline: 10/10 Goal status: 03/14/23 no relief at this time. ongoing  4.  Patient will be able to walk up and down her steps in step over, with U rail, pain < 3/10 Baseline: 10/10 Goal status: INITIAL  5.  Patient will tolerate working a full shift at work of at least 2 hours with B foot pain < 3/10 Baseline: 10/10 Goal status: INITIAL PLAN:  PT FREQUENCY: 1-2x/week  PT DURATION: 10 weeks  PLANNED INTERVENTIONS: Therapeutic exercises, Therapeutic activity, Neuromuscular re-education, Balance training, Gait training, Patient/Family education, Self Care, Joint mobilization, Stair training, Dry Needling, Electrical stimulation, Cryotherapy, Moist heat, Splintting, Taping, Vasopneumatic device, Ultrasound, Ionotophoresis 4mg /ml Dexamethasone, and Manual therapy  PLAN FOR NEXT SESSION: continue with scraping     George Ina, SPTA 03/27/2023, 9:58 AM

## 2023-03-28 ENCOUNTER — Ambulatory Visit: Payer: BC Managed Care – PPO | Admitting: Internal Medicine

## 2023-04-02 ENCOUNTER — Encounter: Payer: Self-pay | Admitting: Physical Therapy

## 2023-04-02 ENCOUNTER — Ambulatory Visit: Payer: BC Managed Care – PPO | Admitting: Physical Therapy

## 2023-04-02 DIAGNOSIS — R293 Abnormal posture: Secondary | ICD-10-CM

## 2023-04-02 DIAGNOSIS — M79671 Pain in right foot: Secondary | ICD-10-CM

## 2023-04-02 DIAGNOSIS — R262 Difficulty in walking, not elsewhere classified: Secondary | ICD-10-CM

## 2023-04-02 DIAGNOSIS — M6281 Muscle weakness (generalized): Secondary | ICD-10-CM

## 2023-04-02 DIAGNOSIS — R600 Localized edema: Secondary | ICD-10-CM

## 2023-04-02 NOTE — Therapy (Signed)
OUTPATIENT PHYSICAL THERAPY LOWER EXTREMITY   Progress Note Reporting Period 02/15/23 to 04/02/23  See note below for Objective Data and Assessment of Progress/Goals.   Patient Name: Teresa Grant MRN: [id number] DOB:September 23, 1961, 62 y.o., female Today's Date: 04/02/2023  END OF SESSION:  PT End of Session - 04/02/23 1550     Visit Number 11    Date for PT Re-Evaluation 04/26/23    PT Start Time 1545    PT Stop Time 1625    PT Time Calculation (min) 40 min    Activity Tolerance Patient tolerated treatment well    Behavior During Therapy [facility] for tasks assessed/performed            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   Diabetes mellitus    Dysrhythmia    1985   Heart murmur    HLD (hyperlipidemia)    Hypertension    Kidney stone on right side 03/2013   Pneumonia    7 yrs ago   Past Surgical History:  Procedure Laterality Date   ABDOMINAL HYSTERECTOMY  2001   BACK SURGERY     CERVICAL SPINE SURGERY  06/06/2019   Dr. Franky Macho, performed at surgical center   CESAREAN SECTION  1990   COLONOSCOPY     Dr. Arlyce Dice normal   LEFT HEART CATH AND CORONARY ANGIOGRAPHY N/A 11/03/2021   Procedure: LEFT HEART CATH AND CORONARY ANGIOGRAPHY;  Surgeon: Yvonne Kendall, MD;  Location: [facility];  Service: Cardiovascular;  Laterality: N/A;   LUMBAR LAMINECTOMY/DECOMPRESSION MICRODISCECTOMY  03/20/2012   Procedure: LUMBAR LAMINECTOMY/DECOMPRESSION MICRODISCECTOMY;  Surgeon: Emilee Hero, MD;  Location: [facility] OR;  Service: Orthopedics;  Laterality: Left;  Left sided lumbar 4-5 microdisectomy   SPINE SURGERY  2013   Patient Active Problem List   Diagnosis Date Noted   Abdominal bloating 01/08/2023   Chronic idiopathic constipation 01/08/2023   Colon cancer screening 01/08/2023   Other long term (current) drug therapy 01/08/2023   Type 2 diabetes mellitus with hyperglycemia (HCC) 01/08/2023   Aortic atherosclerosis (HCC) 12/18/2022    Pain of right heel 12/18/2022   Stage 3a chronic kidney disease (HCC) 12/18/2022   Vulvar candidiasis 12/18/2022   Coronary artery disease of native artery of native heart with stable angina pectoris (HCC) 04/04/2022   Renal insufficiency 12/12/2021   Class 1 obesity due to excess calories with serious comorbidity and body mass index (BMI) of 30.0 to 30.9 in adult 12/12/2021   Type 2 diabetes mellitus with diabetic polyneuropathy, with long-term current use of insulin (HCC) 06/15/2021   Dyslipidemia 06/15/2021   Uncontrolled type 2 diabetes mellitus with hyperglycemia (HCC) 04/13/2019   Pneumonia due to COVID-19 virus 03/02/2019   Diabetes mellitus type 2 in obese 03/02/2019   Tension headache 11/13/2018   Cervical radiculopathy 11/13/2018   Stable angina (HCC) 07/10/2014   Abdominal pain 07/10/2014   Type II or unspecified type diabetes mellitus without mention of complication, uncontrolled 09/05/2013   Renal calculus, right 06/12/2013   Diabetes with proteinuria 11/05/2012   DDD (degenerative disc disease), lumbar 06/23/2012   Hyperlipidemia 11/27/2006   Essential hypertension 11/27/2006    PCP: Dorothyann Peng, MD   REFERRING PROVIDER: Felecia Shelling, DPM  REFERRING DIAG: M72.2 (ICD-10-CM) - Plantar fasciitis, bilateral   THERAPY DIAG:  Difficulty in walking, not elsewhere classified  Bilateral foot pain  Muscle weakness (generalized)  Abnormal posture  Localized edema  Rationale for Evaluation and Treatment: Rehabilitation  ONSET DATE:  02/05/23  SUBJECTIVE:   SUBJECTIVE STATEMENT:  Patient has not worked since her last visit. The pain still fluctuates, was severe yesterday.  PERTINENT HISTORY: Per referring physician note: 62 y.o. female presenting today for follow-up evaluation of plantar fasciitis bilateral over 1 year now.  Patient works on her feet in [employer] all day long.  She continues to have pain and tenderness.  She says the injections and  the prednisone pack elevated her blood sugars drastically.  She continues to take the meloxicam daily.  She also says that the plantar fascia braces were more bothersome to her feet and cause more pain.   PAIN:  Are you having pain? Yes: NPRS scale: 6/10 Pain location: B feet Pain description: burning, severe Aggravating factors: resting, standing for > 1 hour Relieving factors: Foot massages  PRECAUTIONS: None  WEIGHT BEARING RESTRICTIONS: No  FALLS:  Has patient fallen in last 6 months? No  LIVING ENVIRONMENT: Lives with: lives with their family Lives in: House/apartment Stairs: Yes: Internal: 14 steps; on right going up and External: 5 steps; none, has difficulty on some days due to pain, 1 step at a time. Has following equipment at home: None  OCCUPATION: Catering business, on feet all day  PLOF: Independent  PATIENT GOALS: Pain relief, be able to manage the pain.  NEXT MD VISIT: 3 months  OBJECTIVE:   DIAGNOSTIC FINDINGS:  Radiographic exam B/L feet 01/08/2023: Normal osseous mineralization. Joint spaces preserved. No fracture/dislocation/boney destruction. No other soft tissue abnormalities or radiopaque foreign bodies.  Plantar heel spurs noted bilateral   PATIENT SURVEYS:  FOTO 33.7  COGNITION: Overall cognitive status: Within functional limits for tasks assessed     SENSATION: Patient reports some tingling in R hip and LE  EDEMA:  Mild to mod swelling per patient  MUSCLE LENGTH: Hamstrings: Right 75 deg; Left 60 deg Thomas test: B hip flexors tight and painful beyond neutral.  POSTURE: rounded shoulders, decreased lumbar lordosis, and decreased thoracic kyphosis B pes planus, L > R. Tried to wear orthosis provided by Dr, but too painful. Wears Sketchers with some arch support built in when working.  PALPATION: B feet are TTP on ant calcaneus, R medial gastroc and L peroneus longus TP proximally  LOWER EXTREMITY ROM: B hips and knees generally WFL, B  feet stiff, toe ext limited  Passive ROM Right eval Left eval ACTIVE 03/08/23 RT/Left  Hip flexion     Hip extension     Hip abduction     Hip adduction     Hip internal rotation     Hip external rotation     Knee flexion      Knee extension     Ankle dorsiflexion 7 8 WFLS  Ankle plantarflexion     Ankle inversion     Ankle eversion      LOWER EXTREMITY MMT: B hips grossly 4-/5, knees 4/5, B ankles 3+, limited by pain.  GAIT: Distance walked: 68' Assistive device utilized: None Level of assistance: Modified independence Comments: Antalgic gait pattern with B LE, slow Wearing slides with inserts, but still very limited arch support   TODAY'S TREATMENT:  DATE:  04/02/23 NuStep L5 x 6 minutes Ankle strengthening, eversion, DF against G tband, 2 x 10 each. Ball squeeze inversion, hold 3-5 sec, 2 x 10 Standing B heel raise on step, 2 x 10 Standing stretch on step, gastroc and soleus, 4 x 10 sec each leg.  03/27/23 Nustep vl 6 min Ankle ROM on fitness board all motions 2x10 Stretch B PF on steps x4 Ankle 4 way strengthening sitting with green t-band STM to B fet with scraping CP applied to plantar surface of B feet in supine x7 mins  03/22/23 NuStep L5 x 5 minutes Deep STM to B calves, both gastroc and soleus Scraping to B PF from heel toward forefoot B calf stretch on step, gastroc and soleus Supine clamshells, bridge with abd resistance x 10 each CP applied to plantar surface of B feet x 10 minutes.  03/19/23 Bike L4.5 x 6 min Standing PF and DF with black bar 2x10 Standing AROM on fitness gear PF/DF/inversion/eversion 2x10 Airex pad step forward/backward/left/right B x10 Standing plantar flexor stretch on board and steps  Step ups on 6 in x10 Seated strengthening B PF/DF/inversion/eversion red t-band x10 Seated tennis ball rolls with B feet  ~3 mins each  03/16/23 Bike L3 x 6 min Active foot and ankle exercises- towel scrunches, Inversion/eversion on towel, great toe flex with ext of other toes, great toe ext Toe stretching into flex and ext. DF against G Tband resistance, 2 x 10 each leg STM deep stretching, scraping to B feet Soleus and gastroc stretch on steps Heel raises, knee straight and knees bent x 10 each.  03/14/23 Bike L3 x 6 minutes Deep STM with Theragun, with stretch for toe ext and ankle DF/toe flex and ankle PF Ankle strengthening in all planes, DF, PF, Inver, ever 10 reps each  03/09/23 Bike L 4 Black bar DF and PF 2 sets 10 Rocker board 20 x DF/PF Pro fit 3 x each leg 10 sec hold Dyna disc SLS 10 x 4 aways BIL Step ups with airex 10 x BIL Leg press calf raises 3 sets 10 Gastroc and soleous stretching on step 3 each 10 sec Green tband DF/PF 2 sets 10 Passive stretching BIL feet STM BIL feet  03/08/23 Rocker board 20 x DF/PF Pro fit 3 x each leg 10 sec hold SL dyna disc ROM 10 x 4 way Black bar DF and PF 2 sets 10 Green tband DF/PF 2 sets 10 Passive stretching BIL feet STM BIL feet Estim premod BIL PF  PATIENT EDUCATION:  Education details: POC Person educated: Patient Education method: Explanation Education comprehension: verbalized understanding  HOME EXERCISE PROGRAM: [id number]  ASSESSMENT:  CLINICAL IMPRESSION:  Patient's status re-assessed for PN. Unfortunately, she reports that any relief she gets from PT is temporary. She says that the pain still gets up to 10/10, maybe not as frequently though.  OBJECTIVE IMPAIRMENTS: Abnormal gait, decreased activity tolerance, difficulty walking, decreased ROM, decreased strength, increased edema, impaired flexibility, postural dysfunction, and pain.   ACTIVITY LIMITATIONS: standing, squatting, stairs, transfers, and locomotion level  PARTICIPATION LIMITATIONS: meal prep, cleaning, laundry, shopping, community activity, and  occupation  PERSONAL FACTORS: Past/current experiences are also affecting patient's functional outcome.   REHAB POTENTIAL: Good  CLINICAL DECISION MAKING: Evolving/moderate complexity  EVALUATION COMPLEXITY: Low   GOALS: Goals reviewed with patient? Yes  SHORT TERM GOALS: Target date: 03/01/23 I with initial HEP Baseline: Goal status: 03/08/23 MET LONG TERM GOALS: Target date: 04/26/23  I with final HEP  Baseline:  Goal status: 04/02/23- ongoing  2.  Increase B DF PROM to > 20 degrees Baseline: 7R, 8L Goal status: 03/08/23 MET  3.  Patient will report 75% improvement in her pain in her feet overall Baseline: 10/10 Goal status: 04/02/23- 10/10, but does not get bad as frequently as it used to. Ongoing  4.  Patient will be able to walk up and down her steps in step over, with U rail, pain < 3/10 Baseline: 10/10 Goal status: 04/02/23-She can get up and down the steps with pain 10/10, ongoing  5.  Patient will tolerate working a full shift at work of at least 2 hours with B foot pain < 3/10 Baseline: 10/10 Goal status: 04/02/23- Still 10/10, ongoing PLAN:  PT FREQUENCY: 1-2x/week  PT DURATION: 10 weeks  PLANNED INTERVENTIONS: Therapeutic exercises, Therapeutic activity, Neuromuscular re-education, Balance training, Gait training, Patient/Family education, Self Care, Joint mobilization, Stair training, Dry Needling, Electrical stimulation, Cryotherapy, Moist heat, Splintting, Taping, Vasopneumatic device, Ultrasound, Ionotophoresis 4mg /ml Dexamethasone, and Manual therapy  PLAN FOR NEXT SESSION: continue with scraping     Iona Beard, PT, DPT 04/02/2023, 4:27 PM

## 2023-04-04 ENCOUNTER — Ambulatory Visit: Payer: BC Managed Care – PPO | Admitting: Physical Therapy

## 2023-04-04 ENCOUNTER — Encounter: Payer: Self-pay | Admitting: Physical Therapy

## 2023-04-04 DIAGNOSIS — R262 Difficulty in walking, not elsewhere classified: Secondary | ICD-10-CM

## 2023-04-04 DIAGNOSIS — R293 Abnormal posture: Secondary | ICD-10-CM

## 2023-04-04 DIAGNOSIS — M6281 Muscle weakness (generalized): Secondary | ICD-10-CM

## 2023-04-04 DIAGNOSIS — M79671 Pain in right foot: Secondary | ICD-10-CM

## 2023-04-04 DIAGNOSIS — R600 Localized edema: Secondary | ICD-10-CM

## 2023-04-04 NOTE — Therapy (Signed)
OUTPATIENT PHYSICAL THERAPY LOWER EXTREMITY    PHYSICAL THERAPY DISCHARGE SUMMARY  Visits from Start of Care: 12  Current functional level related to goals / functional outcomes: Patient has partially met her goals   Remaining deficits: Continues to have fluctuating pain in her B PF, up to 10/10   Education / Equipment: HEP   Patient agrees to discharge. Patient goals were partially met. Patient is being discharged due to lack of progress.   Patient Name: Teresa Grant MRN: [id number] DOB:01-10-1961, 62 y.o., female Today's Date: 04/04/2023  END OF SESSION:  PT End of Session - 04/04/23 0930     Visit Number 12    Date for PT Re-Evaluation 04/26/23    PT Start Time 0925    PT Stop Time 0946    PT Time Calculation (min) 21 min    Activity Tolerance Patient tolerated treatment well    Behavior During Therapy [facility] for tasks assessed/performed            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   Diabetes mellitus    Dysrhythmia    1985   Heart murmur    HLD (hyperlipidemia)    Hypertension    Kidney stone on right side 03/2013   Pneumonia    7 yrs ago   Past Surgical History:  Procedure Laterality Date   ABDOMINAL HYSTERECTOMY  2001   BACK SURGERY     CERVICAL SPINE SURGERY  06/06/2019   Dr. Franky Macho, performed at surgical center   CESAREAN SECTION  1990   COLONOSCOPY     Dr. Arlyce Dice normal   LEFT HEART CATH AND CORONARY ANGIOGRAPHY N/A 11/03/2021   Procedure: LEFT HEART CATH AND CORONARY ANGIOGRAPHY;  Surgeon: Yvonne Kendall, MD;  Location: [facility];  Service: Cardiovascular;  Laterality: N/A;   LUMBAR LAMINECTOMY/DECOMPRESSION MICRODISCECTOMY  03/20/2012   Procedure: LUMBAR LAMINECTOMY/DECOMPRESSION MICRODISCECTOMY;  Surgeon: Emilee Hero, MD;  Location: [facility] OR;  Service: Orthopedics;  Laterality: Left;  Left sided lumbar 4-5 microdisectomy   SPINE SURGERY  2013   Patient Active Problem List   Diagnosis  Date Noted   Abdominal bloating 01/08/2023   Chronic idiopathic constipation 01/08/2023   Colon cancer screening 01/08/2023   Other long term (current) drug therapy 01/08/2023   Type 2 diabetes mellitus with hyperglycemia (HCC) 01/08/2023   Aortic atherosclerosis (HCC) 12/18/2022   Pain of right heel 12/18/2022   Stage 3a chronic kidney disease (HCC) 12/18/2022   Vulvar candidiasis 12/18/2022   Coronary artery disease of native artery of native heart with stable angina pectoris (HCC) 04/04/2022   Renal insufficiency 12/12/2021   Class 1 obesity due to excess calories with serious comorbidity and body mass index (BMI) of 30.0 to 30.9 in adult 12/12/2021   Type 2 diabetes mellitus with diabetic polyneuropathy, with long-term current use of insulin (HCC) 06/15/2021   Dyslipidemia 06/15/2021   Uncontrolled type 2 diabetes mellitus with hyperglycemia (HCC) 04/13/2019   Pneumonia due to COVID-19 virus 03/02/2019   Diabetes mellitus type 2 in obese 03/02/2019   Tension headache 11/13/2018   Cervical radiculopathy 11/13/2018   Stable angina (HCC) 07/10/2014   Abdominal pain 07/10/2014   Type II or unspecified type diabetes mellitus without mention of complication, uncontrolled 09/05/2013   Renal calculus, right 06/12/2013   Diabetes with proteinuria 11/05/2012   DDD (degenerative disc disease), lumbar 06/23/2012   Hyperlipidemia 11/27/2006   Essential hypertension 11/27/2006    PCP: Dorothyann Peng, MD  REFERRING PROVIDER: Felecia Shelling, DPM  REFERRING DIAG: M72.2 (ICD-10-CM) - Plantar fasciitis, bilateral   THERAPY DIAG:  Difficulty in walking, not elsewhere classified  Bilateral foot pain  Muscle weakness (generalized)  Localized edema  Abnormal posture  Rationale for Evaluation and Treatment: Rehabilitation  ONSET DATE: 02/05/23  SUBJECTIVE:   SUBJECTIVE STATEMENT:  Patient reports that her pain was about a 9 yesterday and this morning.  PERTINENT HISTORY: Per  referring physician note: 62 y.o. female presenting today for follow-up evaluation of plantar fasciitis bilateral over 1 year now.  Patient works on her feet in [employer] all day long.  She continues to have pain and tenderness.  She says the injections and the prednisone pack elevated her blood sugars drastically.  She continues to take the meloxicam daily.  She also says that the plantar fascia braces were more bothersome to her feet and cause more pain.   PAIN:  Are you having pain? Yes: NPRS scale: 6/10 Pain location: B feet Pain description: burning, severe Aggravating factors: resting, standing for > 1 hour Relieving factors: Foot massages  PRECAUTIONS: None  WEIGHT BEARING RESTRICTIONS: No  FALLS:  Has patient fallen in last 6 months? No  LIVING ENVIRONMENT: Lives with: lives with their family Lives in: House/apartment Stairs: Yes: Internal: 14 steps; on right going up and External: 5 steps; none, has difficulty on some days due to pain, 1 step at a time. Has following equipment at home: None  OCCUPATION: Catering business, on feet all day  PLOF: Independent  PATIENT GOALS: Pain relief, be able to manage the pain.  NEXT MD VISIT: 3 months  OBJECTIVE:   DIAGNOSTIC FINDINGS:  Radiographic exam B/L feet 01/08/2023: Normal osseous mineralization. Joint spaces preserved. No fracture/dislocation/boney destruction. No other soft tissue abnormalities or radiopaque foreign bodies.  Plantar heel spurs noted bilateral   PATIENT SURVEYS:  FOTO 33.7  COGNITION: Overall cognitive status: Within functional limits for tasks assessed     SENSATION: Patient reports some tingling in R hip and LE  EDEMA:  Mild to mod swelling per patient  MUSCLE LENGTH: Hamstrings: Right 75 deg; Left 60 deg Thomas test: B hip flexors tight and painful beyond neutral.  POSTURE: rounded shoulders, decreased lumbar lordosis, and decreased thoracic kyphosis B pes planus, L > R. Tried  to wear orthosis provided by Dr, but too painful. Wears Sketchers with some arch support built in when working.  PALPATION: B feet are TTP on ant calcaneus, R medial gastroc and L peroneus longus TP proximally  LOWER EXTREMITY ROM: B hips and knees generally WFL, B feet stiff, toe ext limited  Passive ROM Right eval Left eval ACTIVE 03/08/23 RT/Left  Hip flexion     Hip extension     Hip abduction     Hip adduction     Hip internal rotation     Hip external rotation     Knee flexion      Knee extension     Ankle dorsiflexion 7 8 WFLS  Ankle plantarflexion     Ankle inversion     Ankle eversion      LOWER EXTREMITY MMT: B hips grossly 4-/5, knees 4/5, B ankles 3+, limited by pain.  GAIT: Distance walked: 70' Assistive device utilized: None Level of assistance: Modified independence Comments: Antalgic gait pattern with B LE, slow Wearing slides with inserts, but still very limited arch support   TODAY'S TREATMENT:  DATE:  04/04/23 Bike L3 x 6 minutes Toe yoga, toe spreading, arch lifts in sit.  04/02/23 NuStep L5 x 6 minutes Ankle strengthening, eversion, DF against G tband, 2 x 10 each. Ball squeeze inversion, hold 3-5 sec, 2 x 10 Standing B heel raise on step, 2 x 10 Standing stretch on step, gastroc and soleus, 4 x 10 sec each leg.  03/27/23 Nustep vl 6 min Ankle ROM on fitness board all motions 2x10 Stretch B PF on steps x4 Ankle 4 way strengthening sitting with green t-band STM to B fet with scraping CP applied to plantar surface of B feet in supine x7 mins  03/22/23 NuStep L5 x 5 minutes Deep STM to B calves, both gastroc and soleus Scraping to B PF from heel toward forefoot B calf stretch on step, gastroc and soleus Supine clamshells, bridge with abd resistance x 10 each CP applied to plantar surface of B feet x 10  minutes.  03/19/23 Bike L4.5 x 6 min Standing PF and DF with black bar 2x10 Standing AROM on fitness gear PF/DF/inversion/eversion 2x10 Airex pad step forward/backward/left/right B x10 Standing plantar flexor stretch on board and steps  Step ups on 6 in x10 Seated strengthening B PF/DF/inversion/eversion red t-band x10 Seated tennis ball rolls with B feet ~3 mins each  03/16/23 Bike L3 x 6 min Active foot and ankle exercises- towel scrunches, Inversion/eversion on towel, great toe flex with ext of other toes, great toe ext Toe stretching into flex and ext. DF against G Tband resistance, 2 x 10 each leg STM deep stretching, scraping to B feet Soleus and gastroc stretch on steps Heel raises, knee straight and knees bent x 10 each.  03/14/23 Bike L3 x 6 minutes Deep STM with Theragun, with stretch for toe ext and ankle DF/toe flex and ankle PF Ankle strengthening in all planes, DF, PF, Inver, ever 10 reps each  03/09/23 Bike L 4 Black bar DF and PF 2 sets 10 Rocker board 20 x DF/PF Pro fit 3 x each leg 10 sec hold Dyna disc SLS 10 x 4 aways BIL Step ups with airex 10 x BIL Leg press calf raises 3 sets 10 Gastroc and soleous stretching on step 3 each 10 sec Green tband DF/PF 2 sets 10 Passive stretching BIL feet STM BIL feet  03/08/23 Rocker board 20 x DF/PF Pro fit 3 x each leg 10 sec hold SL dyna disc ROM 10 x 4 way Black bar DF and PF 2 sets 10 Green tband DF/PF 2 sets 10 Passive stretching BIL feet STM BIL feet Estim premod BIL PF  PATIENT EDUCATION:  Education details: POC Person educated: Patient Education method: Explanation Education comprehension: verbalized understanding  HOME EXERCISE PROGRAM: [id number]  ASSESSMENT:  CLINICAL IMPRESSION:  Discussed patient's current status as well as her lack of progress with pain management. She has a Dr appointment on Monday. Therapist and patient agree to D/C from therapy due to lack of progress in her pain.  Discussed possible shock wave treatment, patient will ask her Dr about it.  OBJECTIVE IMPAIRMENTS: Abnormal gait, decreased activity tolerance, difficulty walking, decreased ROM, decreased strength, increased edema, impaired flexibility, postural dysfunction, and pain.   ACTIVITY LIMITATIONS: standing, squatting, stairs, transfers, and locomotion level  PARTICIPATION LIMITATIONS: meal prep, cleaning, laundry, shopping, community activity, and occupation  PERSONAL FACTORS: Past/current experiences are also affecting patient's functional outcome.   REHAB POTENTIAL: Good  CLINICAL DECISION MAKING: Evolving/moderate complexity  EVALUATION COMPLEXITY: Low   GOALS:  Goals reviewed with patient? Yes  SHORT TERM GOALS: Target date: 03/01/23 I with initial HEP Baseline: Goal status: 03/08/23 MET LONG TERM GOALS: Target date: 04/26/23  I with final HEP Baseline:  Goal status: 04/02/23- ongoing  2.  Increase B DF PROM to > 20 degrees Baseline: 7R, 8L Goal status: 03/08/23 MET  3.  Patient will report 75% improvement in her pain in her feet overall Baseline: 10/10 Goal status: 04/02/23- 10/10, but does not get bad as frequently as it used to. Ongoing  4.  Patient will be able to walk up and down her steps in step over, with U rail, pain < 3/10 Baseline: 10/10 Goal status: 04/02/23-She can get up and down the steps with pain 10/10, ongoing  5.  Patient will tolerate working a full shift at work of at least 2 hours with B foot pain < 3/10 Baseline: 10/10 Goal status: 04/02/23- Still 10/10, ongoing PLAN:  PT FREQUENCY: 1-2x/week  PT DURATION: 10 weeks  PLANNED INTERVENTIONS: Therapeutic exercises, Therapeutic activity, Neuromuscular re-education, Balance training, Gait training, Patient/Family education, Self Care, Joint mobilization, Stair training, Dry Needling, Electrical stimulation, Cryotherapy, Moist heat, Splintting, Taping, Vasopneumatic device, Ultrasound, Ionotophoresis 4mg /ml  Dexamethasone, and Manual therapy  PLAN FOR NEXT SESSION: continue with scraping     Iona Beard, PT, DPT 04/04/2023, 9:51 AM

## 2023-04-09 ENCOUNTER — Ambulatory Visit (INDEPENDENT_AMBULATORY_CARE_PROVIDER_SITE_OTHER): Payer: BC Managed Care – PPO | Admitting: Podiatry

## 2023-04-09 ENCOUNTER — Encounter: Payer: Self-pay | Admitting: Podiatry

## 2023-04-09 DIAGNOSIS — M722 Plantar fascial fibromatosis: Secondary | ICD-10-CM

## 2023-04-09 NOTE — Progress Notes (Signed)
Chief Complaint  Patient presents with   Foot Pain    Follow up PF bilateral   "I almost feel like it worse than it was."    Subjective: 63 y.o. female presenting today for follow-up evaluation of plantar fasciitis bilateral over 1 year now.  Patient works on her feet in [employer] all day long.  She continues to have pain and tenderness.  She says the injections and the prednisone pack elevated her blood sugars drastically.  She continues to take the meloxicam daily.  She also says that the plantar fascia braces were more bothersome to her feet and cause more pain.  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   Diabetes mellitus    Dysrhythmia    1985   Heart murmur    HLD (hyperlipidemia)    Hypertension    Kidney stone on right side 03/2013   Pneumonia    7 yrs ago   Past Surgical History:  Procedure Laterality Date   ABDOMINAL HYSTERECTOMY  2001   BACK SURGERY     CERVICAL SPINE SURGERY  06/06/2019   Dr. Franky Macho, performed at surgical center   CESAREAN SECTION  1990   COLONOSCOPY     Dr. Arlyce Dice normal   LEFT HEART CATH AND CORONARY ANGIOGRAPHY N/A 11/03/2021   Procedure: LEFT HEART CATH AND CORONARY ANGIOGRAPHY;  Surgeon: Yvonne Kendall, MD;  [facility];  Service: Cardiovascular;  Laterality: N/A;   LUMBAR LAMINECTOMY/DECOMPRESSION MICRODISCECTOMY  03/20/2012   Procedure: LUMBAR LAMINECTOMY/DECOMPRESSION MICRODISCECTOMY;  Surgeon: Emilee Hero, MD;  Location: [facility] OR;  Service: Orthopedics;  Laterality: Left;  Left sided lumbar 4-5 microdisectomy   SPINE SURGERY  2013    Allergies  Allergen Reactions   Lisinopril Cough   Atorvastatin Other (See Comments)   Glipizide Other (See Comments)   Hydrochlorothiazide Other (See Comments)    pancreatitis     Objective: Physical Exam General: The patient is alert and oriented x3 in no acute distress.  Dermatology: Skin is warm, dry and supple bilateral lower  extremities. Negative for open lesions or macerations bilateral.   Vascular: Dorsalis Pedis and Posterior Tibial pulses palpable bilateral.  Capillary fill time is immediate to all digits.  Neurological: Epicritic and protective threshold intact bilateral.   Musculoskeletal: There continues to be tenderness to palpation to the plantar aspect of the bilateral heels along the plantar fascia. All other joints range of motion within normal limits bilateral. Strength 5/5 in all groups bilateral.   Radiographic exam B/L feet 01/08/2023: Normal osseous mineralization. Joint spaces preserved. No fracture/dislocation/boney destruction. No other soft tissue abnormalities or radiopaque foreign bodies.  Plantar heel spurs noted bilateral  Assessment: 1. plantar fasciitis bilateral feet; x 1.5 years  Plan of Care:  -Patient evaluated.  -Unfortunately patient has not improved with physical therapy or anti-inflammatories or shoe gear modifications.  She has tried multiple conservative modalities without lasting alleviation of her symptoms.  Although she is diabetic she has even tried cortisone injections with no relief -We did discuss surgery today which would include endoscopic plantar fasciotomy bilateral.  Unfortunately her diabetes is currently uncontrolled.  Explained that we will need to get updated A1c levels below 8 prior to surgery.  She has a follow-up appointment with her PCP -Note for work was provided today.  Refrain from work x 4 weeks -Continue meloxicam 15 mg daily or Motrin 800 mg 3 times daily -Return to clinic 4 weeks  *Works  in the catering business  Felecia Shelling, DPM [facility]  Dr. Felecia Shelling, DPM    [address] [place], [place] [address]                Office [phone]  Fax [phone]

## 2023-04-11 ENCOUNTER — Ambulatory Visit: Payer: BC Managed Care – PPO | Admitting: Physical Therapy

## 2023-04-13 ENCOUNTER — Ambulatory Visit: Payer: BC Managed Care – PPO | Admitting: Physical Therapy

## 2023-04-30 NOTE — Progress Notes (Unsigned)
Name: Teresa Grant  MRN/ DOB: [id number], 1961/06/18   Age/ Sex: 62 y.o., female    PCP: Dorothyann Peng, MD   Reason for Endocrinology Evaluation: Type 2 Diabetes Mellitus     Date of Initial Endocrinology Visit: 06/15/2021    PATIENT IDENTIFIER: Teresa Grant is a 62 y.o. female with a past medical history of T2DM, HTN and Dyslipidemia . The patient presented for initial endocrinology clinic visit on 06/15/2021 for consultative assistance with her diabetes management.    HPI: [name] was    Diagnosed with DM 2008 Prior Medications tried/Intolerance: Glipizide , januvia  Hemoglobin A1c has ranged from 6.8% in 2020, peaking at 11.2% in 2022.   On her initial visit to our clinic she had an A1c of 8.5%, we continued Xigduo, increase Ozempic, and decrease basal insulin   Stop Ozempic 07/2022 due to hx of pancreatitis > 20 yrs ago , presenting to ED 06/2022 with N/V and slight elevation in lipase  Started glimepiride 01/2023 due to increased A1c from 6.4% to 11.4%  SUBJECTIVE:   During the last visit (02/16/2023): A1c was 11.4 %     Today (04/30/23): [name] is here for follow-up on diabetes management.  She checks her blood sugars 1x daily . The patient has had hypoglycemic episodes since the last clinic visit.   Patient presented to the ED 01/15/2023 for hyperglycemia with serum glucose 592 mg/DL   She received intra-articular injection and prednisone pak 01/07/2023 through podiatry   She is on Ryebelsus through PCP so far tolerating well , but has hx of pancreatitis   She eats once meal a day   HOME DIABETES REGIMEN: Glimepiride 1 mg daily Xigduo 01-999 mg twice daily  Tresiba 28 units daily      Statin: yes ACE-I/ARB: Intolerant to Lisinopril due to cough     METER DOWNLOAD SUMMARY:   [id number]   DIABETIC COMPLICATIONS: Microvascular complications:   Denies: CKD, retinopathy, neuropathy  Last eye exam: Completed  01/24/2023  Macrovascular complications:   Denies: CAD, PVD, CVA   PAST HISTORY: Past Medical History:  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   Diabetes mellitus    Dysrhythmia    1985   Heart murmur    HLD (hyperlipidemia)    Hypertension    Kidney stone on right side 03/2013   Pneumonia    7 yrs ago   Past Surgical History:  Past Surgical History:  Procedure Laterality Date   ABDOMINAL HYSTERECTOMY  2001   BACK SURGERY     CERVICAL SPINE SURGERY  06/06/2019   Dr. Franky Macho, performed at surgical center   CESAREAN SECTION  1990   COLONOSCOPY     Dr. Arlyce Dice normal   LEFT HEART CATH AND CORONARY ANGIOGRAPHY N/A 11/03/2021   Procedure: LEFT HEART CATH AND CORONARY ANGIOGRAPHY;  Surgeon: Yvonne Kendall, MD;  [facility];  Service: Cardiovascular;  Laterality: N/A;   LUMBAR LAMINECTOMY/DECOMPRESSION MICRODISCECTOMY  03/20/2012   Procedure: LUMBAR LAMINECTOMY/DECOMPRESSION MICRODISCECTOMY;  Surgeon: Emilee Hero, MD;  Location: [facility] OR;  Service: Orthopedics;  Laterality: Left;  Left sided lumbar 4-5 microdisectomy   SPINE SURGERY  2013    Social History:  reports that she quit smoking about 19 years ago. Her smoking use included cigarettes. She started smoking about 44 years ago. She has a 12.5 pack-year smoking history. She has never used smokeless tobacco. She reports current alcohol use. She reports that she does  not use drugs. Family History:  Family History  Problem Relation Age of Onset   Hypertension Mother    Stroke Mother    Heart attack Mother 10   Heart disease Mother    Hypertension Father    Kidney disease Father    Hypertension Sister    Hypertension Maternal Aunt    Hypertension Maternal Uncle    Esophageal cancer Maternal Uncle    Diabetes Paternal Aunt    Diabetes Paternal Uncle    Colon cancer Cousin    Hypertension Brother    Cancer Neg Hx    Colon polyps Neg Hx    Rectal cancer Neg Hx     Stomach cancer Neg Hx    Breast cancer Neg Hx      HOME MEDICATIONS: Allergies as of 05/01/2023       Reactions   Lisinopril Cough   Atorvastatin Other (See Comments)   Glipizide Other (See Comments)   Hydrochlorothiazide Other (See Comments)   pancreatitis        Medication List        Accurate as of April 30, 2023  1:46 PM. If you have any questions, ask your nurse or doctor.          amLODipine 10 MG tablet Commonly known as: NORVASC TAKE 1 TABLET BY MOUTH DAILY   aspirin EC 81 MG tablet Take 1 tablet (81 mg total) by mouth daily. Swallow whole.   atorvastatin 40 MG tablet Commonly known as: Lipitor Take 1 tablet (40 mg total) by mouth daily. Take 1 tablet daily, skip Sundays.   BC Fast Pain Relief 650-195-33.3 MG Pack Generic drug: Aspirin-Salicylamide-Caffeine Take 1 packet by mouth daily as needed (pain.).   Dapagliflozin Pro-metFORMIN ER 01-999 MG Tb24 Commonly known as: Xigduo XR Take 2 tablets by mouth daily in the afternoon.   famotidine 20 MG tablet Commonly known as: PEPCID Take 1 tablet (20 mg total) by mouth 2 (two) times daily.   gabapentin 100 MG capsule Commonly known as: NEURONTIN TAKE ONE CAPSULE BY MOUTH THREE TIMES A DAY What changed:  how to take this when to take this   glimepiride 1 MG tablet Commonly known as: AMARYL Take 1 tablet (1 mg total) by mouth daily with breakfast.   Insulin Pen Needle 32G X 4 MM Misc 1 Device by Does not apply route daily in the afternoon.   Linzess 290 MCG Caps capsule Generic drug: linaclotide Take 290 mcg by mouth daily.   meloxicam 15 MG tablet Commonly known as: MOBIC Take 1 tablet (15 mg total) by mouth daily.   nitroGLYCERIN 0.4 MG SL tablet Commonly known as: NITROSTAT Place 1 tablet (0.4 mg total) under the tongue every 5 (five) minutes as needed for chest pain. UP TO 3 TABLETS   ondansetron 4 MG tablet Commonly known as: ZOFRAN Take 1 tablet (4 mg total) by mouth every 8 (eight)  hours as needed for nausea or vomiting.   OneTouch Verio test strip Generic drug: glucose blood Use as directed to check blood sugars 2 times per day dx: e11.65   telmisartan 20 MG tablet Commonly known as: MICARDIS TAKE ONE TABLET BY MOUTH EVERY EVENING   Tresiba FlexTouch 200 UNIT/ML FlexTouch Pen Generic drug: insulin degludec Inject 28 Units into the skin at bedtime.   VITAMIN C PO Take 500 mg by mouth every evening.   Vitamin D (Ergocalciferol) 1.25 MG (50000 UNIT) Caps capsule Commonly known as: DRISDOL TAKE 1 CAPSULE BY MOUTH ONCE WEEKLY  ZINCATE PO Take 50 mg by mouth every evening.         ALLERGIES: Allergies  Allergen Reactions   Lisinopril Cough   Atorvastatin Other (See Comments)   Glipizide Other (See Comments)   Hydrochlorothiazide Other (See Comments)    pancreatitis     REVIEW OF SYSTEMS: A comprehensive ROS was conducted with the patient and is negative except as per HPI     OBJECTIVE:   VITAL SIGNS:There were no vitals taken for this visit.   PHYSICAL EXAM:  General: Pt appears well and is in NAD  Lungs: Clear with good BS bilat with no rales, rhonchi, or wheezes  Heart: RRR   Abdomen: soft, nontender, without masses or organomegaly palpable  Extremities:  Lower extremities - No pretibial edema. No lesions.  Neuro: MS is good with appropriate affect, pt is alert and Ox3    DM foot exam:01/07/2023 per podiatry   The skin of the feet is intact without sores or ulcerations. The pedal pulses are 2+ on right and 2+ on left. The sensation is decreased  to a screening 5.07, 10 gram monofilament bilaterally   DATA REVIEWED:  Lab Results  Component Value Date   HGBA1C 11.4 (A) 02/16/2023   HGBA1C 12.9 (H) 12/18/2022   HGBA1C 6.4 (A) 08/15/2022    Latest Reference Range & Units 07/19/22 13:00  COMPREHENSIVE METABOLIC PANEL  Rpt !  Sodium 135 - 145 mmol/L 139  Potassium 3.5 - 5.1 mmol/L 4.5  Chloride 98 - 111 mmol/L 104  CO2 22 -  32 mmol/L 22  Glucose 70 - 99 mg/dL 161 (H)  BUN 8 - 23 mg/dL 18  Creatinine 0.96 - 0.45 mg/dL 4.09 (H)  Calcium 8.9 - 10.3 mg/dL 9.9  Anion gap 5 - 15  13  Alkaline Phosphatase 38 - 126 U/L 127 (H)  Albumin 3.5 - 5.0 g/dL 4.5  Lipase 11 - 51 U/L 64 (H)  AST 15 - 41 U/L 22  ALT 0 - 44 U/L 11  Total Protein 6.5 - 8.1 g/dL 7.9  Total Bilirubin 0.3 - 1.2 mg/dL 0.5  GFR, Estimated >81 mL/min 48 (L)      ASSESSMENT / PLAN / RECOMMENDATIONS:   1) Type 2 Diabetes Mellitus, poorly  controlled, With Neuropathic complications - Most recent A1c of 11.4 %. Goal A1c < 7.0 %.    -Her A1c has increased from 6.4%, to 12.9% approximately 2 months ago -Today it is still elevated 11.4% -I suspect dietary indiscretion, as well as discontinuation of GLP-1 agonist, I encouraged the patient to check glucose on a regular basis -She also has received glucocorticoids -She was on glipizide in the past, which was discontinued due to hypoglycemia, I did explain to the patient sulfonylureas are associated with hypoglycemia, I will put her on glimepiride, she was advised to take it 15-20 minutes before the first meal of the day.  I did explain to the patient that if she takes glimepiride and not eat within 30 minutes she will have hypoglycemia -I will reduce Evaristo Bury as below , patient with fasting hypoglycemia -I stopped Ozempic due to history of pancreatitis, she also had an episode with elevated lipase associated nausea and vomiting in 2023.  Patient is on Rybelsus to PCP.  The patient does admit that her GI symptoms improved with discontinuation of Ozempic.  I did explain to the patient the risk of pancreatitis with GLP-1 agonist.  She will discontinue Rybelsus   MEDICATIONS:   Start glimepiride 1 mg  daily before breakfast Continue Xigduo 01-999 mg 2 tabs at night Decrease Tresiba to 28 units daily   EDUCATION / INSTRUCTIONS: BG monitoring instructions: Patient is instructed to check her blood sugars 1  times a day, fasting . Call [place] [facility] if: BG persistently < 70  I reviewed the Rule of 15 for the treatment of hypoglycemia in detail with the patient. Literature supplied.   2) Diabetic complications:  Eye: Does not have known diabetic retinopathy.  Neuro/ Feet: Does  have known diabetic peripheral neuropathy based on exam 9/21/202 Renal: Patient does not have known baseline CKD. She is  on an ACEI/ARB at present.  3) Dyslipidemia:    - LDL was above goal at 100 mg/dL in 2956, We switched pravastatin to  Atorvastatin  -LDL has been at goal   Medication   Continue  Atorvastatin 40 mg daily     F/U in 3 months    Signed electronically by: Lyndle Herrlich, MD  [facility] End[facility]  [facility] [address] [place] [place], [place] [address] Phone: [phone] FAX: [phone]   CC: Dorothyann Peng, MD [address] [place] [address] Phone: [phone]  Fax: [phone]    Return to Endocrinology clinic as below: Future Appointments  Date Time Provider Department Center  05/01/2023  8:30 AM , Konrad Dolores, MD [facility] None  05/14/2023  8:15 AM Felecia Shelling, DPM [facility]  07/18/2023 10:40 AM Dorothyann Peng, MD [facility] None  12/25/2023  2:00 PM Dorothyann Peng, MD [facility] None

## 2023-05-01 ENCOUNTER — Encounter: Payer: Self-pay | Admitting: Internal Medicine

## 2023-05-01 ENCOUNTER — Ambulatory Visit (INDEPENDENT_AMBULATORY_CARE_PROVIDER_SITE_OTHER): Payer: BC Managed Care – PPO | Admitting: Internal Medicine

## 2023-05-01 VITALS — BP 122/80 | HR 73 | Ht 61.0 in | Wt 167.0 lb

## 2023-05-01 DIAGNOSIS — E782 Mixed hyperlipidemia: Secondary | ICD-10-CM | POA: Diagnosis not present

## 2023-05-01 DIAGNOSIS — E1142 Type 2 diabetes mellitus with diabetic polyneuropathy: Secondary | ICD-10-CM

## 2023-05-01 DIAGNOSIS — Z794 Long term (current) use of insulin: Secondary | ICD-10-CM | POA: Diagnosis not present

## 2023-05-01 DIAGNOSIS — E1165 Type 2 diabetes mellitus with hyperglycemia: Secondary | ICD-10-CM

## 2023-05-01 LAB — POCT GLYCOSYLATED HEMOGLOBIN (HGB A1C): Hemoglobin A1C: 8.5 % — AB (ref 4.0–5.6)

## 2023-05-01 LAB — POCT GLUCOSE (DEVICE FOR HOME USE): Glucose Fasting, POC: 133 mg/dL — AB (ref 70–99)

## 2023-05-01 MED ORDER — GLIMEPIRIDE 2 MG PO TABS: 2.0000 mg | ORAL_TABLET | Freq: Every day | ORAL | 3 refills | Status: DC

## 2023-05-01 MED ORDER — DAPAGLIFLOZIN PRO-METFORMIN ER 5-1000 MG PO TB24: 2.0000 | ORAL_TABLET | Freq: Every day | ORAL | 3 refills | Status: DC

## 2023-05-01 MED ORDER — ATORVASTATIN CALCIUM 40 MG PO TABS
40.0000 mg | ORAL_TABLET | Freq: Every day | ORAL | 3 refills | Status: DC
Start: 2023-05-01 — End: 2023-12-06

## 2023-05-01 MED ORDER — TRESIBA FLEXTOUCH 200 UNIT/ML ~~LOC~~ SOPN: 24.0000 [IU] | PEN_INJECTOR | Freq: Every day | SUBCUTANEOUS | 3 refills | Status: DC

## 2023-05-01 NOTE — Patient Instructions (Signed)
Increase Glimepiride 2 mg, 1 tablet before Breakfast  Continue Xigduo 01-999 mg, 2 tablets at night   Decrease  Tresiba 24 units daily    HOW TO TREAT LOW BLOOD SUGARS (Blood sugar LESS THAN 70 MG/DL) Please follow the RULE OF 15 for the treatment of hypoglycemia treatment (when your (blood sugars are less than 70 mg/dL)   STEP 1: Take 15 grams of carbohydrates when your blood sugar is low, which includes:  3-4 GLUCOSE TABS  OR 3-4 OZ OF JUICE OR REGULAR SODA OR ONE TUBE OF GLUCOSE GEL    STEP 2: RECHECK blood sugar in 15 MINUTES STEP 3: If your blood sugar is still low at the 15 minute recheck --> then, go back to STEP 1 and treat AGAIN with another 15 grams of carbohydrates.

## 2023-05-14 ENCOUNTER — Encounter: Payer: Self-pay | Admitting: Podiatry

## 2023-05-14 ENCOUNTER — Ambulatory Visit (INDEPENDENT_AMBULATORY_CARE_PROVIDER_SITE_OTHER): Payer: BC Managed Care – PPO | Admitting: Podiatry

## 2023-05-14 DIAGNOSIS — M722 Plantar fascial fibromatosis: Secondary | ICD-10-CM | POA: Diagnosis not present

## 2023-05-14 NOTE — Patient Instructions (Signed)
Voltaren gel available at any pharmacy 

## 2023-05-14 NOTE — Progress Notes (Signed)
Chief Complaint  Patient presents with   Foot Pain    Follow up PF bilateral   "They still hurt. He took me out of work and that has helped, but if I do any more activity than normal, they get really bad"    Subjective: 62 y.o. female presenting today for follow-up evaluation of plantar fasciitis bilateral over 1 year now.  Patient works on her feet in [employer] all day long.  She continues to have pain and tenderness.  She says the injections and the prednisone pack elevated her blood sugars drastically.  She continues to take the meloxicam daily.  She also says that the plantar fascia braces were more bothersome to her feet and cause more pain.  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   Diabetes mellitus    Dysrhythmia    1985   Heart murmur    HLD (hyperlipidemia)    Hypertension    Kidney stone on right side 03/2013   Pneumonia    7 yrs ago   Past Surgical History:  Procedure Laterality Date   ABDOMINAL HYSTERECTOMY  2001   BACK SURGERY     CERVICAL SPINE SURGERY  06/06/2019   Dr. Franky Macho, performed at surgical center   CESAREAN SECTION  1990   COLONOSCOPY     Dr. Arlyce Dice normal   LEFT HEART CATH AND CORONARY ANGIOGRAPHY N/A 11/03/2021   Procedure: LEFT HEART CATH AND CORONARY ANGIOGRAPHY;  Surgeon: Yvonne Kendall, MD;  [facility]: [facility];  Service: Cardiovascular;  Laterality: N/A;   LUMBAR LAMINECTOMY/DECOMPRESSION MICRODISCECTOMY  03/20/2012   Procedure: LUMBAR LAMINECTOMY/DECOMPRESSION MICRODISCECTOMY;  Surgeon: Emilee Hero, MD;  Location: [facility] OR;  Service: Orthopedics;  Laterality: Left;  Left sided lumbar 4-5 microdisectomy   SPINE SURGERY  2013    Allergies  Allergen Reactions   Lisinopril Cough   Atorvastatin Other (See Comments)   Hydrochlorothiazide Other (See Comments)    pancreatitis     Objective: Physical Exam General: The patient is alert and oriented x3 in no acute  distress.  Dermatology: Skin is warm, dry and supple bilateral lower extremities. Negative for open lesions or macerations bilateral.   Vascular: Dorsalis Pedis and Posterior Tibial pulses palpable bilateral.  Capillary fill time is immediate to all digits.  Neurological: Epicritic and protective threshold intact bilateral.   Musculoskeletal: There continues to be tenderness to palpation to the plantar aspect of the bilateral heels along the plantar fascia. All other joints range of motion within normal limits bilateral. Strength 5/5 in all groups bilateral.   Radiographic exam B/L feet 01/08/2023: Normal osseous mineralization. Joint spaces preserved. No fracture/dislocation/boney destruction. No other soft tissue abnormalities or radiopaque foreign bodies.  Plantar heel spurs noted bilateral  Assessment: 1. plantar fasciitis bilateral feet; x 1.5 years  Plan of Care:  -Patient evaluated.  - Patient continues to have pain and tenderness associated to the plantar heels bilaterally.  She says the injections only helped for a couple of days and they also elevate her sugar.  No injections administered -Continue meloxicam 15 mg daily as needed -Recommend OTC Voltaren topical gel to the heels as needed -Continue good supportive tennis shoes and sneakers -Patient unable to work on her feet for prolonged periods of time without experiencing an exacerbation of pain.  She would like a note today for work to refrain from work indefinitely until we can proceed with surgery and she fully recovers postoperatively.  Note provided -  Return to clinic in 3 months after updated A1c.  Last A1c 05/01/2023 was 8.5.  Anticipate this will be below 8 and we could proceed with surgery at this time which would include endoscopic plantar fasciotomy bilateral  *Works in the catering business  Felecia Shelling, [place] [facility]  Dr. Felecia Shelling, DPM    [address] [place], [place] [address]                Office [phone]  Fax [phone]

## 2023-06-28 ENCOUNTER — Other Ambulatory Visit: Payer: Self-pay | Admitting: Internal Medicine

## 2023-07-05 ENCOUNTER — Other Ambulatory Visit: Payer: Self-pay

## 2023-07-05 MED ORDER — TELMISARTAN 20 MG PO TABS: 20.0000 mg | ORAL_TABLET | Freq: Every evening | ORAL | 2 refills | Status: DC

## 2023-07-18 ENCOUNTER — Ambulatory Visit (INDEPENDENT_AMBULATORY_CARE_PROVIDER_SITE_OTHER): Payer: BC Managed Care – PPO | Admitting: Internal Medicine

## 2023-07-18 VITALS — BP 122/84 | HR 77 | Temp 97.8°F | Ht 61.0 in | Wt 167.6 lb

## 2023-07-18 DIAGNOSIS — E66811 Obesity, class 1: Secondary | ICD-10-CM

## 2023-07-18 DIAGNOSIS — R195 Other fecal abnormalities: Secondary | ICD-10-CM

## 2023-07-18 DIAGNOSIS — I25118 Atherosclerotic heart disease of native coronary artery with other forms of angina pectoris: Secondary | ICD-10-CM

## 2023-07-18 DIAGNOSIS — Z794 Long term (current) use of insulin: Secondary | ICD-10-CM

## 2023-07-18 DIAGNOSIS — N1831 Chronic kidney disease, stage 3a: Secondary | ICD-10-CM

## 2023-07-18 DIAGNOSIS — E1142 Type 2 diabetes mellitus with diabetic polyneuropathy: Secondary | ICD-10-CM | POA: Diagnosis not present

## 2023-07-18 DIAGNOSIS — Z6831 Body mass index (BMI) 31.0-31.9, adult: Secondary | ICD-10-CM

## 2023-07-18 DIAGNOSIS — Z23 Encounter for immunization: Secondary | ICD-10-CM | POA: Diagnosis not present

## 2023-07-18 DIAGNOSIS — E6609 Other obesity due to excess calories: Secondary | ICD-10-CM

## 2023-07-18 DIAGNOSIS — I1310 Hypertensive heart and chronic kidney disease without heart failure, with stage 1 through stage 4 chronic kidney disease, or unspecified chronic kidney disease: Secondary | ICD-10-CM | POA: Diagnosis not present

## 2023-07-18 NOTE — Assessment & Plan Note (Addendum)
Chronic, fair control. Goal BP<120/80. She will continue with telmisartan 20mg  daily and amlodipine 10mg  daily. Encouraged to follow low sodium diet. She will f/ in six months for full physical examination.

## 2023-07-18 NOTE — Patient Instructions (Signed)
Hypertension, Adult Hypertension is another name for high blood pressure. High blood pressure forces your heart to work harder to pump blood. This can cause problems over time. There are two numbers in a blood pressure reading. There is a top number (systolic) over a bottom number (diastolic). It is best to have a blood pressure that is below 120/80. What are the causes? The cause of this condition is not known. Some other conditions can lead to high blood pressure. What increases the risk? Some lifestyle factors can make you more likely to develop high blood pressure: Smoking. Not getting enough exercise or physical activity. Being overweight. Having too much fat, sugar, calories, or salt (sodium) in your diet. Drinking too much alcohol. Other risk factors include: Having any of these conditions: Heart disease. Diabetes. High cholesterol. Kidney disease. Obstructive sleep apnea. Having a family history of high blood pressure and high cholesterol. Age. The risk increases with age. Stress. What are the signs or symptoms? High blood pressure may not cause symptoms. Very high blood pressure (hypertensive crisis) may cause: Headache. Fast or uneven heartbeats (palpitations). Shortness of breath. Nosebleed. Vomiting or feeling like you may vomit (nauseous). Changes in how you see. Very bad chest pain. Feeling dizzy. Seizures. How is this treated? This condition is treated by making healthy lifestyle changes, such as: Eating healthy foods. Exercising more. Drinking less alcohol. Your doctor may prescribe medicine if lifestyle changes do not help enough and if: Your top number is above 130. Your bottom number is above 80. Your personal target blood pressure may vary. Follow these instructions at home: Eating and drinking  If told, follow the DASH eating plan. To follow this plan: Fill one half of your plate at each meal with fruits and vegetables. Fill one fourth of your plate  at each meal with whole grains. Whole grains include whole-wheat pasta, brown rice, and whole-grain bread. Eat or drink low-fat dairy products, such as skim milk or low-fat yogurt. Fill one fourth of your plate at each meal with low-fat (lean) proteins. Low-fat proteins include fish, chicken without skin, eggs, beans, and tofu. Avoid fatty meat, cured and processed meat, or chicken with skin. Avoid pre-made or processed food. Limit the amount of salt in your diet to less than 1,500 mg each day. Do not drink alcohol if: Your doctor tells you not to drink. You are pregnant, may be pregnant, or are planning to become pregnant. If you drink alcohol: Limit how much you have to: 0-1 drink a day for women. 0-2 drinks a day for men. Know how much alcohol is in your drink. In the [place] one drink equals one 12 oz bottle of beer (355 mL), one 5 oz glass of wine (148 mL), or one 1 oz glass of hard liquor (44 mL). Lifestyle  Work with your doctor to stay at a healthy weight or to lose weight. Ask your doctor what the best weight is for you. Get at least 30 minutes of exercise that causes your heart to beat faster (aerobic exercise) most days of the week. This may include walking, swimming, or biking. Get at least 30 minutes of exercise that strengthens your muscles (resistance exercise) at least 3 days a week. This may include lifting weights or doing Pilates. Do not smoke or use any products that contain nicotine or tobacco. If you need help quitting, ask your doctor. Check your blood pressure at home as told by your doctor. Keep all follow-up visits. Medicines Take over-the-counter and prescription medicines   only as told by your doctor. Follow directions carefully. Do not skip doses of blood pressure medicine. The medicine does not work as well if you skip doses. Skipping doses also puts you at risk for problems. Ask your doctor about side effects or reactions to medicines that you should watch  for. Contact a doctor if: You think you are having a reaction to the medicine you are taking. You have headaches that keep coming back. You feel dizzy. You have swelling in your ankles. You have trouble with your vision. Get help right away if: You get a very bad headache. You start to feel mixed up (confused). You feel weak or numb. You feel faint. You have very bad pain in your: Chest. Belly (abdomen). You vomit more than once. You have trouble breathing. These symptoms may be an emergency. Get help right away. Call 911. Do not wait to see if the symptoms will go away. Do not drive yourself to the hospital. Summary Hypertension is another name for high blood pressure. High blood pressure forces your heart to work harder to pump blood. For most people, a normal blood pressure is less than 120/80. Making healthy choices can help lower blood pressure. If your blood pressure does not get lower with healthy choices, you may need to take medicine. This information is not intended to replace advice given to you by your health care provider. Make sure you discuss any questions you have with your health care provider. Document Revised: 06/30/2021 Document Reviewed: 06/30/2021 Elsevier Patient Education  2024 [employer]vier Inc.  

## 2023-07-18 NOTE — Assessment & Plan Note (Signed)
Chronic, she is encouraged to keep BP well controlled and to stay well hydrated to decrease risk of CKD progression.

## 2023-07-18 NOTE — Assessment & Plan Note (Signed)
She states she had several episodes of tan-colored stools over the past 2-3 weeks. She did not have n/v at the time. She admits being gassy.  This has since resolved.  I will check CMP today.

## 2023-07-18 NOTE — Assessment & Plan Note (Signed)
Chronic, she is also followed by Endo. Their input is appreciated. She reports improved dietary choices. She has not been able to exercise recently. She will continue with Xigduo 5/1000mg  2 tabs daily, glimepridie 2mg  daily and Tresiba 24 units nightly.

## 2023-07-18 NOTE — Assessment & Plan Note (Signed)
Chronic, she has had Cardiology evaluation. LDL goal is less than 70.  She will continue with aspirin and atorvastatin. She is without cp at this time.

## 2023-07-18 NOTE — Progress Notes (Signed)
I,Victoria T Deloria Lair, CMA,acting as a Neurosurgeon for [name], MD.,have documented all relevant documentation on the behalf of [name], MD,as directed by  [name], MD while in the presence of [name], MD.  Subjective:  Patient ID: Teresa Grant , female    DOB: [birth year] , 62 y.o.   MRN: [id number]  Chief Complaint  Patient presents with   Hypertension   Diabetes    HPI  Patient presents today for blood pressure & diabetes check. She reports compliance with medications. Denies headache, chest pain & sob. She reports completing hysterectomy 22 years ago.   Hypertension This is a chronic problem. The current episode started more than 1 year ago. The problem has been gradually improving since onset. The problem is controlled. Pertinent negatives include no blurred vision, headaches or palpitations. The current treatment provides moderate improvement. Compliance problems include exercise.   Diabetes She presents for her follow-up diabetic visit. She has type 2 diabetes mellitus. Her disease course has been stable. There are no hypoglycemic associated symptoms. Pertinent negatives for hypoglycemia include no headaches. Pertinent negatives for diabetes include no blurred vision, no polydipsia, no polyphagia and no polyuria. There are no hypoglycemic complications. Risk factors for coronary artery disease include diabetes mellitus, dyslipidemia, hypertension, obesity, post-menopausal, sedentary lifestyle and stress. She is following a diabetic diet. She participates in exercise intermittently. Her breakfast blood glucose is taken between 8-9 am. Her breakfast blood glucose range is generally 130-140 mg/dl. An ACE inhibitor/angiotensin II receptor blocker is being taken. Eye exam is current.     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   Diabetes mellitus    Dysrhythmia    1985   Heart murmur    HLD (hyperlipidemia)    Hypertension     Kidney stone on right side 03/2013   Pneumonia    7 yrs ago     Family History  Problem Relation Age of Onset   Hypertension Mother    Stroke Mother    Heart attack Mother 31   Heart disease Mother    Hypertension Father    Kidney disease Father    Hypertension Sister    Hypertension Maternal Aunt    Hypertension Maternal Uncle    Esophageal cancer Maternal Uncle    Diabetes Paternal Aunt    Diabetes Paternal Uncle    Colon cancer Cousin    Hypertension Brother    Cancer Neg Hx    Colon polyps Neg Hx    Rectal cancer Neg Hx    Stomach cancer Neg Hx    Breast cancer Neg Hx      Current Outpatient Medications:    amLODipine (NORVASC) 10 MG tablet, TAKE 1 TABLET BY MOUTH DAILY, Disp: 90 tablet, Rfl: 0   Ascorbic Acid (VITAMIN C PO), Take 500 mg by mouth every evening., Disp: , Rfl:    aspirin EC 81 MG tablet, Take 1 tablet (81 mg total) by mouth daily. Swallow whole., Disp: 90 tablet, Rfl: 3   Aspirin-Salicylamide-Caffeine (BC FAST PAIN RELIEF) 650-195-33.3 MG PACK, Take 1 packet by mouth daily as needed (pain.)., Disp: , Rfl:    atorvastatin (LIPITOR) 40 MG tablet, Take 1 tablet (40 mg total) by mouth daily. Take 1 tablet daily, skip Sundays., Disp: 90 tablet, Rfl: 3   Dapagliflozin Pro-metFORMIN ER (XIGDUO XR) 01-999 MG TB24, Take 2 tablets by mouth daily in the afternoon., Disp: 180 tablet, Rfl: 3   famotidine (  PEPCID) 20 MG tablet, Take 1 tablet (20 mg total) by mouth 2 (two) times daily., Disp: 180 tablet, Rfl: 1   gabapentin (NEURONTIN) 100 MG capsule, TAKE ONE CAPSULE BY MOUTH THREE TIMES A DAY (Patient taking differently: 100 mg 2 (two) times daily.), Disp: 270 capsule, Rfl: 1   glimepiride (AMARYL) 2 MG tablet, Take 1 tablet (2 mg total) by mouth daily before breakfast., Disp: 90 tablet, Rfl: 3   glucose blood (ONETOUCH VERIO) test strip, Use as directed to check blood sugars 2 times per day dx: e11.65, Disp: 100 each, Rfl: 10   insulin degludec (TRESIBA FLEXTOUCH) 200  UNIT/ML FlexTouch Pen, Inject 24 Units into the skin at bedtime., Disp: 30 mL, Rfl: 3   Insulin Pen Needle 32G X 4 MM MISC, 1 Device by Does not apply route daily in the afternoon., Disp: 100 each, Rfl: 3   LINZESS 290 MCG CAPS capsule, Take 290 mcg by mouth daily., Disp: , Rfl:    meloxicam (MOBIC) 15 MG tablet, Take 1 tablet (15 mg total) by mouth daily., Disp: 30 tablet, Rfl: 1   ondansetron (ZOFRAN) 4 MG tablet, Take 1 tablet (4 mg total) by mouth every 8 (eight) hours as needed for nausea or vomiting., Disp: 15 tablet, Rfl: 0   telmisartan (MICARDIS) 20 MG tablet, Take 1 tablet (20 mg total) by mouth every evening., Disp: 90 tablet, Rfl: 2   Vitamin D, Ergocalciferol, (DRISDOL) 1.25 MG (50000 UNIT) CAPS capsule, TAKE 1 CAPSULE BY MOUTH ONCE WEEKLY, Disp: 12 capsule, Rfl: 1   Zinc Sulfate (ZINCATE PO), Take 50 mg by mouth every evening., Disp: , Rfl:    nitroGLYCERIN (NITROSTAT) 0.4 MG SL tablet, Place 1 tablet (0.4 mg total) under the tongue every 5 (five) minutes as needed for chest pain. UP TO 3 TABLETS, Disp: 25 tablet, Rfl: PRN   Allergies  Allergen Reactions   Lisinopril Cough   Atorvastatin Other (See Comments)   Hydrochlorothiazide Other (See Comments)    pancreatitis     Review of Systems  Constitutional: Negative.   Eyes:  Negative for blurred vision.  Respiratory: Negative.    Cardiovascular: Negative.  Negative for palpitations.  Endocrine: Negative for polydipsia, polyphagia and polyuria.  Neurological: Negative.  Negative for headaches.  Psychiatric/Behavioral: Negative.       Today's Vitals   07/18/23 1029  BP: 122/84  Pulse: 77  Temp: 97.8 F (36.6 C)  SpO2: 98%  Weight: 167 lb 9.6 oz (76 kg)  Height: 5\' 1"  (1.549 m)   Body mass index is 31.67 kg/m.  Wt Readings from Last 3 Encounters:  07/18/23 167 lb 9.6 oz (76 kg)  05/01/23 167 lb (75.8 kg)  03/08/23 167 lb 9.6 oz (76 kg)     Objective:  Physical Exam Vitals and nursing note reviewed.   Constitutional:      Appearance: Normal appearance.  HENT:     Head: Normocephalic and atraumatic.  Eyes:     Extraocular Movements: Extraocular movements intact.  Cardiovascular:     Rate and Rhythm: Normal rate and regular rhythm.     Heart sounds: Normal heart sounds.  Pulmonary:     Effort: Pulmonary effort is normal.     Breath sounds: Normal breath sounds.  Musculoskeletal:     Cervical back: Normal range of motion.  Skin:    General: Skin is warm.  Neurological:     General: No focal deficit present.     Mental Status: She is alert.  Psychiatric:  Mood and Affect: Mood normal.        Behavior: Behavior normal.         Assessment And Plan:  Hypertensive heart and renal disease with renal failure, stage 1 through stage 4 or unspecified chronic kidney disease, without heart failure Assessment & Plan: Chronic, fair control. Goal BP<120/80. She will continue with telmisartan 20mg  daily and amlodipine 10mg  daily. Encouraged to follow low sodium diet. She will f/ in six months for full physical examination.   Orders: -     CMP14+EGFR -     TSH  Type 2 diabetes mellitus with diabetic polyneuropathy, with long-term current use of insulin (HCC) Assessment & Plan: Chronic, she is also followed by Endo. Their input is appreciated. She reports improved dietary choices. She has not been able to exercise recently. She will continue with Xigduo 5/1000mg  2 tabs daily, glimepridie 2mg  daily and Tresiba 24 units nightly.   Orders: -     CMP14+EGFR -     Hemoglobin A1c  Stage 3a chronic kidney disease (HCC) Assessment & Plan: Chronic, she is encouraged to keep BP well controlled and to stay well hydrated to decrease risk of CKD progression.      Coronary artery disease of native artery of native heart with stable angina pectoris [facility]) Assessment & Plan: Chronic, she has had Cardiology evaluation. LDL goal is less than 70.  She will continue with aspirin and atorvastatin.  She is without cp at this time.    Abnormal stool color Assessment & Plan: She states she had several episodes of tan-colored stools over the past 2-3 weeks. She did not have n/v at the time. She admits being gassy.  This has since resolved.  I will check CMP today.    Class 1 obesity due to excess calories with serious comorbidity and body mass index (BMI) of 31.0 to 31.9 in adult Assessment & Plan: She is encouraged to strive for BMI less than 30 to decrease cardiac risk. Advised to aim for at least 150 minutes of exercise per week.    Immunization due -     Varicella-zoster vaccine IM  She is encouraged to strive for BMI less than 30 to decrease cardiac risk. Advised to aim for at least 150 minutes of exercise per week.    Return in 3 weeks (on 08/08/2023), or [place] - flu shot.  Patient was given opportunity to ask questions. Patient verbalized understanding of the plan and was able to repeat key elements of the plan. All questions were answered to their satisfaction.    I, [name], MD, have reviewed all documentation for this visit. The documentation on 07/18/23 for the exam, diagnosis, procedures, and orders are all accurate and complete.   IF YOU HAVE BEEN REFERRED TO A SPECIALIST, IT MAY TAKE 1-2 WEEKS TO SCHEDULE/PROCESS THE REFERRAL. IF YOU HAVE NOT HEARD FROM US/SPECIALIST IN TWO WEEKS, PLEASE GIVE [place] A CALL AT [phone].   THE PATIENT IS ENCOURAGED TO PRACTICE SOCIAL DISTANCING DUE TO THE COVID-19 PANDEMIC.

## 2023-07-18 NOTE — Assessment & Plan Note (Signed)
She is encouraged to strive for BMI less than 30 to decrease cardiac risk. Advised to aim for at least 150 minutes of exercise per week.

## 2023-07-19 LAB — CMP14+EGFR
ALT: 15 [IU]/L (ref 0–32)
AST: 18 [IU]/L (ref 0–40)
Albumin: 4.3 g/dL (ref 3.9–4.9)
Alkaline Phosphatase: 139 [IU]/L — ABNORMAL HIGH (ref 44–121)
BUN/Creatinine Ratio: 17 (ref 12–28)
BUN: 20 mg/dL (ref 8–27)
Bilirubin Total: 0.5 mg/dL (ref 0.0–1.2)
CO2: 22 mmol/L (ref 20–29)
Calcium: 9.9 mg/dL (ref 8.7–10.3)
Chloride: 105 mmol/L (ref 96–106)
Creatinine, Ser: 1.2 mg/dL — ABNORMAL HIGH (ref 0.57–1.00)
Globulin, Total: 3.1 g/dL (ref 1.5–4.5)
Glucose: 73 mg/dL (ref 70–99)
Potassium: 4 mmol/L (ref 3.5–5.2)
Sodium: 144 mmol/L (ref 134–144)
Total Protein: 7.4 g/dL (ref 6.0–8.5)
eGFR: 51 mL/min/{1.73_m2} — ABNORMAL LOW (ref >59)

## 2023-07-19 LAB — HEMOGLOBIN A1C
Est. average glucose Bld gHb Est-mCnc: 214 mg/dL
Hgb A1c MFr Bld: 9.1 % — ABNORMAL HIGH (ref 4.8–5.6)

## 2023-07-19 LAB — TSH: TSH: 2 u[IU]/mL (ref 0.450–4.500)

## 2023-07-27 ENCOUNTER — Ambulatory Visit: Payer: BC Managed Care – PPO | Admitting: Internal Medicine

## 2023-08-01 ENCOUNTER — Other Ambulatory Visit: Payer: Self-pay | Admitting: Internal Medicine

## 2023-08-06 ENCOUNTER — Other Ambulatory Visit: Payer: Self-pay | Admitting: Internal Medicine

## 2023-08-10 ENCOUNTER — Ambulatory Visit (INDEPENDENT_AMBULATORY_CARE_PROVIDER_SITE_OTHER): Payer: BC Managed Care – PPO

## 2023-08-10 VITALS — BP 120/80 | HR 86 | Temp 98.1°F | Ht 61.0 in | Wt 167.0 lb

## 2023-08-10 DIAGNOSIS — Z23 Encounter for immunization: Secondary | ICD-10-CM | POA: Diagnosis not present

## 2023-08-10 NOTE — Progress Notes (Signed)
Patient presents today for a flu vaccine. YL,RMA 

## 2023-08-15 ENCOUNTER — Ambulatory Visit: Payer: BC Managed Care – PPO | Admitting: Podiatry

## 2023-08-15 DIAGNOSIS — M62469 Contracture of muscle, unspecified lower leg: Secondary | ICD-10-CM

## 2023-08-15 DIAGNOSIS — M722 Plantar fascial fibromatosis: Secondary | ICD-10-CM | POA: Diagnosis not present

## 2023-08-15 NOTE — Progress Notes (Signed)
Chief Complaint  Patient presents with   Plantar Fasciitis    Patient is here for bilateral PF and surgery consult    Subjective: 62 y.o. female presenting today for follow-up evaluation of plantar fasciitis bilateral over 1 year now.  Patient works on her feet in [employer] all day long.  She continues to have pain and tenderness.  She says the injections and the prednisone pack elevated her blood sugars drastically.  She continues to take the meloxicam daily.  She also says that the plantar fascia braces were more bothersome to her feet and cause more pain.  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   Diabetes mellitus    Dysrhythmia    1985   Heart murmur    HLD (hyperlipidemia)    Hypertension    Kidney stone on right side 03/2013   Pneumonia    7 yrs ago   Past Surgical History:  Procedure Laterality Date   ABDOMINAL HYSTERECTOMY  2001   BACK SURGERY     CERVICAL SPINE SURGERY  06/06/2019   Dr. Franky Macho, performed at surgical center   CESAREAN SECTION  1990   COLONOSCOPY     Dr. Arlyce Dice normal   LEFT HEART CATH AND CORONARY ANGIOGRAPHY N/A 11/03/2021   Procedure: LEFT HEART CATH AND CORONARY ANGIOGRAPHY;  Surgeon: Yvonne Kendall, MD;  Location: [facility];  Service: Cardiovascular;  Laterality: N/A;   LUMBAR LAMINECTOMY/DECOMPRESSION MICRODISCECTOMY  03/20/2012   Procedure: LUMBAR LAMINECTOMY/DECOMPRESSION MICRODISCECTOMY;  Surgeon: Emilee Hero, MD;  Location: [facility] OR;  Service: Orthopedics;  Laterality: Left;  Left sided lumbar 4-5 microdisectomy   SPINE SURGERY  2013    Allergies  Allergen Reactions   Lisinopril Cough   Atorvastatin Other (See Comments)   Hydrochlorothiazide Other (See Comments)    pancreatitis     Objective: Physical Exam General: The patient is alert and oriented x3 in no acute distress.  Dermatology: Skin is warm, dry and supple bilateral lower extremities. Negative for open lesions  or macerations bilateral.   Vascular: Dorsalis Pedis and Posterior Tibial pulses palpable bilateral.  Capillary fill time is immediate to all digits.  Neurological: Epicritic and protective threshold intact bilateral.   Musculoskeletal: There continues to be tenderness to palpation to the plantar aspect of the bilateral heels along the plantar fascia. Strength 5/5 in all groups bilateral.  Limited ankle joint dorsiflexion also noted with tight posterior complex bilateral  Radiographic exam B/L feet 01/08/2023: Normal osseous mineralization. Joint spaces preserved. No fracture/dislocation/boney destruction. No other soft tissue abnormalities or radiopaque foreign bodies.  Plantar heel spurs noted bilateral  Assessment: 1. Chronic plantar fasciitis bilateral feet; >1.5 years 2. Gastroc equinus bilateral   Plan of Care:  -Patient evaluated.  - Patient continues to have pain and tenderness associated to the plantar heels bilaterally has been chronic for almost 2 years now.  Conservative modalities have only been temporary alleviating the patient's pain.  I do believe at this time it is appropriate to discuss surgery.  We discussed the plantar fasciitis and gastroc aponeurosis lengthening surgery was discussed in detail with the patient.  Postoperative recovery course was explained in detail as well.  Risk benefits advantages and disadvantages were explained.  No guarantees were expressed or implied.  The patient consents to proceed with surgery at this time -We did discuss the risk and benefits advantages and disadvantages associated to bilateral versus unilateral surgery.  Patient has equal pain and tenderness  to the bilateral lower extremities.  She ensures me that she has lots of family at home that can help her postoperatively.  Technically she can be weightbearing in a cam boot postoperatively as well.  I explained that she would be in a cam boot to the bilateral lower extremities with the  assistance of a walker.  Patient understands and would like to proceed with bilateral surgery.  I do believe that weightbearing in a cam boot in addition to stability with a walker she will be okay to recover from bilateral surgery.  -Also there is concern about the patient's elevated A1c levels.  Patient states that she is working closely with her PCP to reduce those levels.  I explained that I would ideally like to have her A1c levels below 8.0 prior to surgery.  She understands.  She has a follow-up appointment with her PCP in December for new A1c as well as appointment in January with her endocrinologist.  Surgery will be scheduled after these appointments.  We will proceed with surgery as long as she is able to reduce her A1c levels -Continue meloxicam 15 mg daily as needed - Continue OTC Voltaren topical gel to the heels as needed -Continue good supportive tennis shoes and sneakers - Return to clinic 1 week postop  *Works in the catering business  Felecia Shelling, DPM [facility]  Dr. Felecia Shelling, DPM    [address] [place], [place] [address]                Office [phone]  Fax [phone]

## 2023-09-04 ENCOUNTER — Ambulatory Visit (INDEPENDENT_AMBULATORY_CARE_PROVIDER_SITE_OTHER): Payer: BC Managed Care – PPO | Admitting: Internal Medicine

## 2023-09-04 ENCOUNTER — Encounter: Payer: Self-pay | Admitting: Internal Medicine

## 2023-09-04 VITALS — BP 150/80 | HR 88 | Resp 20 | Ht 61.0 in | Wt 170.2 lb

## 2023-09-04 DIAGNOSIS — Z794 Long term (current) use of insulin: Secondary | ICD-10-CM | POA: Diagnosis not present

## 2023-09-04 DIAGNOSIS — E785 Hyperlipidemia, unspecified: Secondary | ICD-10-CM

## 2023-09-04 DIAGNOSIS — E1142 Type 2 diabetes mellitus with diabetic polyneuropathy: Secondary | ICD-10-CM | POA: Diagnosis not present

## 2023-09-04 DIAGNOSIS — R1013 Epigastric pain: Secondary | ICD-10-CM | POA: Diagnosis not present

## 2023-09-04 DIAGNOSIS — G8929 Other chronic pain: Secondary | ICD-10-CM

## 2023-09-04 MED ORDER — GLIMEPIRIDE 4 MG PO TABS
4.0000 mg | ORAL_TABLET | Freq: Every day | ORAL | 3 refills | Status: DC
Start: 2023-09-04 — End: 2023-12-05

## 2023-09-04 NOTE — Progress Notes (Signed)
Name: Teresa Grant  MRN/ DOB: [id number], [birth year]   Age/ Sex: 62 y.o., female    PCP: Dorothyann Peng, MD   Reason for Endocrinology Evaluation: Type 2 Diabetes Mellitus     Date of Initial Endocrinology Visit: 06/15/2021    PATIENT IDENTIFIER: Teresa Grant is a 62 y.o. female with a past medical history of T2DM, HTN and Dyslipidemia . The patient presented for initial endocrinology clinic visit on 06/15/2021 for consultative assistance with her diabetes management.    HPI: Teresa Grant was    Diagnosed with DM 2008 Prior Medications tried/Intolerance: Glipizide , januvia  Hemoglobin A1c has ranged from 6.8% in 2020, peaking at 11.2% in 2022.   On her initial visit to our clinic she had an A1c of 8.5%, we continued Xigduo, increase Ozempic, and decrease basal insulin   Stop Ozempic 07/2022 due to hx of pancreatitis , presenting to ED 06/2022 with N/V and slight elevation in lipase  Started glimepiride 01/2023 due to increased A1c from 6.4% to 11.4%  SUBJECTIVE:   During the last visit (05/01/2023): A1c was 8.5 %     Today (09/04/23): Teresa Grant is here for follow-up on diabetes management.  She checks her blood sugars 1x daily .  She does endorse symptomatic hypoglycemia with BG's in the 90s    Pt follows with podiatry for plantar fasciitis 08/15/2023, she had a pending surgery but this was postponed due to hyperglycemia   The patient put herself on a restrictive diet but unfortunately her A1c increased from 8.5% to 9.1%, I have recommended a referral to our dietitian for education   She has an episode of nausea last night with vomiting after eating at [facility] She does have chronic epigastric pain for approximately a year, she does have chronic loose stools as well   HOME DIABETES REGIMEN: Glimepiride 2 mg daily Xigduo 01-999 mg twice daily  Tresiba 24 units daily  Atorvastatin 40 mg daily    Statin: yes ACE-I/ARB: Intolerant to Lisinopril  due to cough     METER DOWNLOAD SUMMARY: 11/26-12/06/2023  Average Number Tests/Day = 0.7 Overall Mean FS Glucose = 151   BG Ranges: Low = 113 High = 207   DIABETIC COMPLICATIONS: Microvascular complications:   Denies: CKD, retinopathy, neuropathy  Last eye exam: Completed 01/24/2023  Macrovascular complications:   Denies: CAD, PVD, CVA   PAST HISTORY: Past Medical History:  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   Diabetes mellitus    Dysrhythmia    1985   Heart murmur    HLD (hyperlipidemia)    Hypertension    Kidney stone on right side 03/2013   Pneumonia    7 yrs ago   Past Surgical History:  Past Surgical History:  Procedure Laterality Date   ABDOMINAL HYSTERECTOMY  2001   BACK SURGERY     CERVICAL SPINE SURGERY  06/06/2019   Dr. Franky Macho, performed at surgical center   CESAREAN SECTION  1990   COLONOSCOPY     Dr. Arlyce Dice normal   LEFT HEART CATH AND CORONARY ANGIOGRAPHY N/A 11/03/2021   Procedure: LEFT HEART CATH AND CORONARY ANGIOGRAPHY;  Surgeon: Yvonne Kendall, MD;  [facility];  Service: Cardiovascular;  Laterality: N/A;   LUMBAR LAMINECTOMY/DECOMPRESSION MICRODISCECTOMY  03/20/2012   Procedure: LUMBAR LAMINECTOMY/DECOMPRESSION MICRODISCECTOMY;  Surgeon: Emilee Hero, MD;  Location: [facility] OR;  Service: Orthopedics;  Laterality: Left;  Left sided lumbar 4-5 microdisectomy   SPINE SURGERY  2013    Social History:  reports that she quit smoking about 19 years ago. Her smoking use included cigarettes. She started smoking about 44 years ago. She has a 12.5 pack-year smoking history. She has never used smokeless tobacco. She reports current alcohol use. She reports that she does not use drugs. Family History:  Family History  Problem Relation Age of Onset   Hypertension Mother    Stroke Mother    Heart attack Mother 53   Heart disease Mother    Hypertension Father    Kidney disease Father     Hypertension Sister    Hypertension Maternal Aunt    Hypertension Maternal Uncle    Esophageal cancer Maternal Uncle    Diabetes Paternal Aunt    Diabetes Paternal Uncle    Colon cancer Cousin    Hypertension Brother    Cancer Neg Hx    Colon polyps Neg Hx    Rectal cancer Neg Hx    Stomach cancer Neg Hx    Breast cancer Neg Hx      HOME MEDICATIONS: Allergies as of 09/04/2023       Reactions   Lisinopril Cough   Atorvastatin Other (See Comments)   Hydrochlorothiazide Other (See Comments)   pancreatitis        Medication List        Accurate as of September 04, 2023  3:15 PM. If you have any questions, ask your nurse or doctor.          amLODipine 10 MG tablet Commonly known as: NORVASC TAKE 1 TABLET BY MOUTH DAILY   aspirin EC 81 MG tablet Take 1 tablet (81 mg total) by mouth daily. Swallow whole.   atorvastatin 40 MG tablet Commonly known as: Lipitor Take 1 tablet (40 mg total) by mouth daily. Take 1 tablet daily, skip Sundays.   BC Fast Pain Relief 650-195-33.3 MG Pack Generic drug: Aspirin-Salicylamide-Caffeine Take 1 packet by mouth daily as needed (pain.).   Dapagliflozin Pro-metFORMIN ER 01-999 MG Tb24 Commonly known as: Xigduo XR Take 2 tablets by mouth daily in the afternoon.   famotidine 20 MG tablet Commonly known as: PEPCID TAKE 1 TABLET BY MOUTH TWICE A DAY   gabapentin 100 MG capsule Commonly known as: NEURONTIN TAKE ONE CAPSULE BY MOUTH THREE TIMES A DAY What changed:  how to take this when to take this   glimepiride 2 MG tablet Commonly known as: AMARYL Take 1 tablet (2 mg total) by mouth daily before breakfast.   Insulin Pen Needle 32G X 4 MM Misc 1 Device by Does not apply route daily in the afternoon.   Linzess 290 MCG Caps capsule Generic drug: linaclotide Take 290 mcg by mouth daily.   meloxicam 15 MG tablet Commonly known as: MOBIC Take 1 tablet (15 mg total) by mouth daily.   nitroGLYCERIN 0.4 MG SL  tablet Commonly known as: NITROSTAT Place 1 tablet (0.4 mg total) under the tongue every 5 (five) minutes as needed for chest pain. UP TO 3 TABLETS   ondansetron 4 MG tablet Commonly known as: ZOFRAN Take 1 tablet (4 mg total) by mouth every 8 (eight) hours as needed for nausea or vomiting.   OneTouch Verio test strip Generic drug: glucose blood Use as directed to check blood sugars 2 times per day dx: e11.65   telmisartan 20 MG tablet Commonly known as: MICARDIS Take 1 tablet (20 mg total) by mouth every evening.   Evaristo Bury FlexTouch 200 UNIT/ML FlexTouch Pen Generic drug:  insulin degludec Inject 24 Units into the skin at bedtime.   VITAMIN C PO Take 500 mg by mouth every evening.   Vitamin D (Ergocalciferol) 1.25 MG (50000 UNIT) Caps capsule Commonly known as: DRISDOL TAKE 1 CAPSULE BY MOUTH ONCE WEEKLY   ZINCATE PO Take 50 mg by mouth every evening.         ALLERGIES: Allergies  Allergen Reactions   Lisinopril Cough   Atorvastatin Other (See Comments)   Hydrochlorothiazide Other (See Comments)    pancreatitis     REVIEW OF SYSTEMS: A comprehensive ROS was conducted with the patient and is negative except as per HPI     OBJECTIVE:   VITAL SIGNS:BP (!) 150/80 (BP Location: Left Arm, Patient Position: Sitting, Cuff Size: Large)   Pulse 88   Resp 20   Ht 5\' 1"  (1.549 m)   Wt 170 lb 3.2 oz (77.2 kg)   SpO2 98%   BMI 32.16 kg/m    PHYSICAL EXAM:  General: Pt appears well and is in NAD  Lungs: Clear with good BS bilat   Heart: RRR   Extremities:  Lower extremities - No pretibial edema. No lesions.  Neuro: MS is good with appropriate affect, pt is alert and Ox3    DM foot exam: 08/15/2023 per podiatry   The skin of the feet is intact without sores or ulcerations. The pedal pulses are 2+ on right and 2+ on left. The sensation is decreased  to a screening 5.07, 10 gram monofilament bilaterally   DATA REVIEWED:  Lab Results  Component Value Date    HGBA1C 9.1 (H) 07/18/2023   HGBA1C 8.5 (A) 05/01/2023   HGBA1C 11.4 (A) 02/16/2023     Latest Reference Range & Units 07/18/23 11:34  Sodium 134 - 144 mmol/L 144  Potassium 3.5 - 5.2 mmol/L 4.0  Chloride 96 - 106 mmol/L 105  CO2 20 - 29 mmol/L 22  Glucose 70 - 99 mg/dL 73  BUN 8 - 27 mg/dL 20  Creatinine 4.09 - 8.11 mg/dL 9.14 (H)  Calcium 8.7 - 10.3 mg/dL 9.9  BUN/Creatinine Ratio 12 - 28  17  eGFR >59 mL/min/1.73 51 (L)  Alkaline Phosphatase 44 - 121 IU/L 139 (H)  Albumin 3.9 - 4.9 g/dL 4.3  AST 0 - 40 IU/L 18  ALT 0 - 32 IU/L 15  Total Protein 6.0 - 8.5 g/dL 7.4    ASSESSMENT / PLAN / RECOMMENDATIONS:   1) Type 2 Diabetes Mellitus, poorly  controlled, With Neuropathic complications - Most recent A1c of 9.1 %. Goal A1c < 7.0 %.    -A1c has increased from 8.5% to 9.1% -The patient endorses restrictive dietary changes over a period of 2 months, but she discontinued once she saw that her A1c has increased.  I have referred her to our CDE for proper education, as I am not sure if what she was doing was the correct action -She was on glipizide in the past, which was discontinued due to hypoglycemia -I stopped Ozempic due to history of pancreatitis and chronic abdominal pain -I will increase glimepiride as below MEDICATIONS:   Increase glimepiride 4 mg daily before breakfast Continue Xigduo 01-999 mg 2 tabs at night Continue Tresiba to 24 units daily   EDUCATION / INSTRUCTIONS: BG monitoring instructions: Patient is instructed to check her blood sugars 1 times a day, fasting . Call [facility] if: BG persistently < 70  I reviewed the Rule of 15 for the treatment of hypoglycemia in  detail with the patient. Literature supplied.   2) Diabetic complications:  Eye: Does not have known diabetic retinopathy.  Neuro/ Feet: Does  have known diabetic peripheral neuropathy based on exam 9/21/202 Renal: Patient does not have known baseline CKD. She is  on an  ACEI/ARB at present.  3) Dyslipidemia:    - LDL was above goal at 100 mg/dL in 8119, We switched pravastatin to  Atorvastatin  -LDL has been at goal   Medication   Continue  Atorvastatin 40 mg daily   3. Chronic Epigastric Pain :  -Patient with chronic epigastric pain for the past year as well as loose stools -She does have a history of pancreatitis -She did have an episode of nausea and vomiting last night after eating [employer] but this is not a typical occurrence  F/U in 3 months    Signed electronically by: Lyndle Herrlich, MD  [facility]  [facility] [address] [place], [place] [address] Phone: [phone] FAX: [phone]   CC: Dorothyann Peng, MD [address] [place] [address] Phone: [phone]  Fax: [phone]    Return to Endocrinology clinic as below: Future Appointments  Date Time Provider Department Center  09/17/2023 10:20 AM [facility] None  12/25/2023  2:00 PM Dorothyann Peng, MD [facility] None

## 2023-09-04 NOTE — Patient Instructions (Addendum)
Increase Glimepiride 4 mg, 1 tablet before Breakfast  Continue Xigduo 01-999 mg, 2 tablets at night   Continue Tresiba 24 units daily    HOW TO TREAT LOW BLOOD SUGARS (Blood sugar LESS THAN 70 MG/DL) Please follow the RULE OF 15 for the treatment of hypoglycemia treatment (when your (blood sugars are less than 70 mg/dL)   STEP 1: Take 15 grams of carbohydrates when your blood sugar is low, which includes:  3-4 GLUCOSE TABS  OR 3-4 OZ OF JUICE OR REGULAR SODA OR ONE TUBE OF GLUCOSE GEL    STEP 2: RECHECK blood sugar in 15 MINUTES STEP 3: If your blood sugar is still low at the 15 minute recheck --> then, go back to STEP 1 and treat AGAIN with another 15 grams of carbohydrates.

## 2023-09-05 ENCOUNTER — Encounter: Payer: Self-pay | Admitting: Internal Medicine

## 2023-09-12 ENCOUNTER — Encounter: Payer: BC Managed Care – PPO | Attending: Internal Medicine | Admitting: Nutrition

## 2023-09-12 VITALS — Ht 61.0 in | Wt 169.0 lb

## 2023-09-12 DIAGNOSIS — Z794 Long term (current) use of insulin: Secondary | ICD-10-CM | POA: Diagnosis present

## 2023-09-12 DIAGNOSIS — E1142 Type 2 diabetes mellitus with diabetic polyneuropathy: Secondary | ICD-10-CM | POA: Insufficient documentation

## 2023-09-12 DIAGNOSIS — E1165 Type 2 diabetes mellitus with hyperglycemia: Secondary | ICD-10-CM | POA: Insufficient documentation

## 2023-09-12 NOTE — Progress Notes (Signed)
Patietn is here today to learn about what is making her blood sugars go high and what she can do to bring them down.  She is to have surgery in 3 weeks and is afraid high blood sugar readings will prevent this surgery.   Diabetes medications:  Treisiba 24u at bedtime  says rarely forgets this dose                                       Glimepiride: 4mg  1 AM.   Exercise:  none. Housework in AM for 3-4 hours.  Says use to walk, but paine in feet and legs prevent this.  Surgery to help this. ?? SBGM:  One touch meter.  Testing FBSs only:  today: 105, yesterday 200, other days: 170, 140, 122.   Diet:  Does not follow any special diet  has stopped all sweet drinks except for an occasional 1/2 glass of sweet tea acS.  Will not eat a starch at this meal.          Typical Day:  5AM: away-lays in bed and watches TV 6:30 UP  Does house work .  Drinks water all morning and during the night.  2-3PM: 1 piece of cheese toast, or plain bread.  Is not hungry.   3PM: gets shakey at 3PM 2X/wk.  Dies not test blood sugar.  Drinks juice and feels better in 15 minutes. 5-7PM: 3 ounce meat-usually baked, starchy veg. 1 serving 3X/week.  1 non starchy veg. Serving.  Water to drink.   9PM: bed.  Drinks water at night.  Rare snacking after supper.  Sometimes popcorn or chips Discussion:  What diabetes is, and what she can do to decrease insulin resistance and better control her blood sugar readings. Discussed importance of better blood sugar control in healing more quickly after surgery etc.   SBGM:  She was given a Dexcom G7  lot: [id number]  exp. 10/25 to see what blood sugars are doing after meals, and during the night.  Believe she will see overall effects of snacking on blood sugars that I think she is doing.   Discussed goals of blood sugar readings:  fasting: less than120, and 2hr. Pc: less than 160 Need for exercise.  She reports dancing some with granddaughter that is in dance class.  She was given exercises to do  to help grand daughter and self,strengthening leg muscles to prevent falls, and maintain better balance.  Stop all sweet drinks, fruit juices and no cold cereal and milk. Make sure protein in all meals to prevent muscle loss.  Suggestions given for one ounce of protein at 12PM to help with this Believe overall understanding is very good, but motivation may be lacking some.  Believer the CGM will help to keep her from overeating, or seeing what is running her blood sugar up.  Suggested she come back in 10 days for another sensor to help her up until  date of surgery, and to show MD of better blood sugar control.  She agreed to do this and had no final questions.

## 2023-09-12 NOTE — Patient Instructions (Signed)
1.Stop all sweet drinks, fruit juices and cold cereal and milk 2.Exercise with chair and daily strengthening exercises for total of 20-30 minutes per day 3. Make sure all meals have protein and 1-2 servings of carbohydrate (milk, fruit or starchy vegetable, or rice or pasta/noodles.    4.  Return 10 days for new sensor and review of blood sugar readings with food journal

## 2023-09-17 ENCOUNTER — Other Ambulatory Visit: Payer: BC Managed Care – PPO

## 2023-09-17 DIAGNOSIS — E1142 Type 2 diabetes mellitus with diabetic polyneuropathy: Secondary | ICD-10-CM

## 2023-09-17 LAB — HEMOGLOBIN A1C
Est. average glucose Bld gHb Est-mCnc: 183 mg/dL
Hgb A1c MFr Bld: 8 % — ABNORMAL HIGH (ref 4.8–5.6)

## 2023-09-24 ENCOUNTER — Encounter: Payer: Self-pay | Admitting: Gastroenterology

## 2023-09-24 ENCOUNTER — Telehealth: Payer: Self-pay | Admitting: Podiatry

## 2023-09-24 ENCOUNTER — Telehealth: Payer: Self-pay | Admitting: Nutrition

## 2023-09-24 ENCOUNTER — Encounter: Payer: BC Managed Care – PPO | Admitting: Nutrition

## 2023-09-24 DIAGNOSIS — E1142 Type 2 diabetes mellitus with diabetic polyneuropathy: Secondary | ICD-10-CM | POA: Diagnosis not present

## 2023-09-24 DIAGNOSIS — E1165 Type 2 diabetes mellitus with hyperglycemia: Secondary | ICD-10-CM

## 2023-09-24 NOTE — Telephone Encounter (Signed)
Patient is requesting that you please order her the Dexcom G7 sensors.  She is having surgery on 10/18/23 and would like them before then.  Thank you

## 2023-09-24 NOTE — Patient Instructions (Addendum)
Stop all sweet drinks Continue diet as recommended at last visit.

## 2023-09-24 NOTE — Telephone Encounter (Signed)
Called and left pt a message to return call, called to see if she has her new insurance information for the new year, so that I can get her 1/23 surgery auth started.

## 2023-09-24 NOTE — Progress Notes (Signed)
Patient reports that dexcom sensor is helping her to eat better, by seeing what foods raise blood sugar (sweet drinks-even though it is only 4 ounces).  She is requesting a prescription for this.  She reports severe low at less than 40 with symptoms and needing help with getting juice.  Download shows only one low blood sugar last 10 days.  The download also shows that that last 7 days (over christmas) average blood sugar is 163.  The download was put on Dr. Harvel Ricks desk, with request for prescription for this.  She is having foot surgery in January and pleases that her HgbA1C has come down to 8.0 which will allow her to have this.  She had no final questions.

## 2023-09-25 MED ORDER — DEXCOM G7 SENSOR MISC: 1 refills | Status: DC

## 2023-09-25 NOTE — Telephone Encounter (Signed)
Done

## 2023-09-27 ENCOUNTER — Telehealth: Payer: Self-pay | Admitting: Podiatry

## 2023-09-27 NOTE — Telephone Encounter (Signed)
 DOS- 10/18/23  EPF BILAT- 70106 GASTROCNEMIUS RECESS BILAT- 72310  [employer]ETNA EFFECTIVE DATE- 09/26/23  DEDUCTIBLE- $1500.00 WITH REMAINING $1500.00 OOP-$5900.00 WITH REMAINING $5900.00 COINSURANCE- 30%  PER THE AETNA AUTOMATED SYSTEM, PRIOR AUTH IS NOT REQUIRED FOR CPT CODES [phone] AND [phone].  CALL REFERENCE NUMBER: [id number]

## 2023-09-30 ENCOUNTER — Other Ambulatory Visit: Payer: Self-pay | Admitting: Internal Medicine

## 2023-10-02 ENCOUNTER — Other Ambulatory Visit (HOSPITAL_COMMUNITY): Payer: Self-pay

## 2023-10-02 ENCOUNTER — Telehealth: Payer: Self-pay

## 2023-10-02 NOTE — Telephone Encounter (Signed)
 Pharmacy Patient Advocate Encounter   Received notification from CoverMyMeds that prior authorization for Dexcom G7 sensor is required/requested.   Insurance verification completed.   The patient is insured through [facility] .   Per test claim: PA required; PA submitted to above mentioned insurance via CoverMyMeds Key/confirmation #/EOC [id number] Status is pending

## 2023-10-05 NOTE — Telephone Encounter (Signed)
 Pharmacy Patient Advocate Encounter  Received notification from [facility] that Prior Authorization for Dexcom G7 sensor has been APPROVED through 09/30/2024   PA #/Case ID/Reference #: [id number]

## 2023-10-09 ENCOUNTER — Ambulatory Visit: Payer: BC Managed Care – PPO | Admitting: Internal Medicine

## 2023-10-18 ENCOUNTER — Other Ambulatory Visit: Payer: Self-pay | Admitting: Podiatry

## 2023-10-18 DIAGNOSIS — M722 Plantar fascial fibromatosis: Secondary | ICD-10-CM | POA: Diagnosis not present

## 2023-10-18 DIAGNOSIS — M216X2 Other acquired deformities of left foot: Secondary | ICD-10-CM | POA: Diagnosis not present

## 2023-10-18 DIAGNOSIS — M216X1 Other acquired deformities of right foot: Secondary | ICD-10-CM | POA: Diagnosis not present

## 2023-10-18 MED ORDER — IBUPROFEN 800 MG PO TABS: 800.0000 mg | ORAL_TABLET | Freq: Three times a day (TID) | ORAL | 1 refills | Status: AC

## 2023-10-18 MED ORDER — OXYCODONE-ACETAMINOPHEN 5-325 MG PO TABS: 1.0000 | ORAL_TABLET | ORAL | 0 refills | Status: DC | PRN

## 2023-10-18 MED ORDER — ASPIRIN 81 MG PO TBEC
325.0000 mg | DELAYED_RELEASE_TABLET | Freq: Every day | ORAL | 0 refills | Status: DC
Start: 2023-10-18 — End: 2024-06-25

## 2023-10-18 NOTE — Progress Notes (Signed)
PRN postop 

## 2023-10-21 ENCOUNTER — Other Ambulatory Visit: Payer: Self-pay | Admitting: Podiatry

## 2023-10-24 ENCOUNTER — Ambulatory Visit (INDEPENDENT_AMBULATORY_CARE_PROVIDER_SITE_OTHER): Payer: 59 | Admitting: Podiatry

## 2023-10-24 ENCOUNTER — Encounter: Payer: Self-pay | Admitting: Podiatry

## 2023-10-24 DIAGNOSIS — M722 Plantar fascial fibromatosis: Secondary | ICD-10-CM

## 2023-10-24 DIAGNOSIS — Z9889 Other specified postprocedural states: Secondary | ICD-10-CM

## 2023-10-24 NOTE — Progress Notes (Signed)
   Chief Complaint  Patient presents with   Routine Post Op    "It's hurting. My toes are swollen.  I think I pulled something in my left leg.  The left one hurts the most.  My pain level is about an eight."    Subjective:  Patient presents today status post bilateral EPF and gastroc aponeurosis lengthening.  DOS: 10/18/2023.  Patient doing well.  Minimal WBAT in the cam boots with the assistance of a walker.  She is mostly in a wheelchair.  No new complaints  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   Diabetes mellitus    Dysrhythmia    1985   Heart murmur    HLD (hyperlipidemia)    Hypertension    Kidney stone on right side 03/2013   Pneumonia    7 yrs ago    Past Surgical History:  Procedure Laterality Date   ABDOMINAL HYSTERECTOMY  2001   BACK SURGERY     CERVICAL SPINE SURGERY  06/06/2019   Dr. Franky Macho, performed at surgical center   CESAREAN SECTION  1990   COLONOSCOPY     Dr. Arlyce Dice normal   LEFT HEART CATH AND CORONARY ANGIOGRAPHY N/A 11/03/2021   Procedure: LEFT HEART CATH AND CORONARY ANGIOGRAPHY;  Surgeon: Yvonne Kendall, MD;  Location: [facility];  Service: Cardiovascular;  Laterality: N/A;   LUMBAR LAMINECTOMY/DECOMPRESSION MICRODISCECTOMY  03/20/2012   Procedure: LUMBAR LAMINECTOMY/DECOMPRESSION MICRODISCECTOMY;  Surgeon: Emilee Hero, MD;  Location: [facility];  Service: Orthopedics;  Laterality: Left;  Left sided lumbar 4-5 microdisectomy   SPINE SURGERY  2013    Allergies  Allergen Reactions   Lisinopril Cough   Atorvastatin Other (See Comments)   Hydrochlorothiazide Other (See Comments)    pancreatitis    Objective/Physical Exam Neurovascular status intact.  Incision well coapted with sutures intact. No sign of infectious process noted. No dehiscence. No active bleeding noted.  Moderate edema noted to the surgical extremity.  Assessment: 1. s/p EPF + gastroc aponeurosis lengthening B/L. DOS:  10/18/2023   Plan of Care:  -Patient was evaluated.  -Dressings changed -Continue minimal WBAT cam boots bilateral with the assistance of a walker. -Prescription was also provided for the patient for a bedside commode and shower chair.  Recommend taking to [facility] medical supply -Return to clinic 1 week suture and staple removal  Felecia Shelling, DPM [facility]  Dr. Felecia Shelling, DPM    [address] [place], [place] [address]                Office [phone]  Fax [phone]

## 2023-10-31 ENCOUNTER — Ambulatory Visit (INDEPENDENT_AMBULATORY_CARE_PROVIDER_SITE_OTHER): Payer: Self-pay | Admitting: Podiatry

## 2023-10-31 ENCOUNTER — Encounter: Payer: Self-pay | Admitting: Podiatry

## 2023-10-31 VITALS — Ht 61.0 in | Wt 169.0 lb

## 2023-10-31 DIAGNOSIS — M722 Plantar fascial fibromatosis: Secondary | ICD-10-CM

## 2023-10-31 NOTE — Progress Notes (Signed)
   Chief Complaint  Patient presents with   Routine Post Op    Pt is here for routine post op visit, pt states she is still in some pain especially at night.    Subjective:  Patient presents today status post bilateral EPF and gastroc aponeurosis lengthening.  DOS: 10/18/2023.  Patient doing well.  Minimal WBAT in the cam boots with the assistance of a walker.  She is mostly in a wheelchair.  No new complaints  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   Diabetes mellitus    Dysrhythmia    1985   Heart murmur    HLD (hyperlipidemia)    Hypertension    Kidney stone on right side 03/2013   Pneumonia    7 yrs ago    Past Surgical History:  Procedure Laterality Date   ABDOMINAL HYSTERECTOMY  2001   BACK SURGERY     CERVICAL SPINE SURGERY  06/06/2019   Dr. Gillie, performed at surgical center   CESAREAN SECTION  1990   COLONOSCOPY     Dr. Debrah normal   LEFT HEART CATH AND CORONARY ANGIOGRAPHY N/A 11/03/2021   Procedure: LEFT HEART CATH AND CORONARY ANGIOGRAPHY;  Surgeon: Mady Bruckner, MD;  Location: [facility];  Service: Cardiovascular;  Laterality: N/A;   LUMBAR LAMINECTOMY/DECOMPRESSION MICRODISCECTOMY  03/20/2012   Procedure: LUMBAR LAMINECTOMY/DECOMPRESSION MICRODISCECTOMY;  Surgeon: Oneil Rodgers Priestly, MD;  Location: [facility] OR;  Service: Orthopedics;  Laterality: Left;  Left sided lumbar 4-5 microdisectomy   SPINE SURGERY  2013    Allergies  Allergen Reactions   Lisinopril Cough   Atorvastatin  Other (See Comments)   Hydrochlorothiazide Other (See Comments)    pancreatitis    Objective/Physical Exam Neurovascular status intact.  Incision well coapted with sutures intact. No sign of infectious process noted. No dehiscence. No active bleeding noted.  Moderate edema noted to the surgical extremity.  Assessment: 1. s/p EPF + gastroc aponeurosis lengthening B/L. DOS: 10/18/2023   Plan of Care:  -Patient was evaluated.  - Sutures  and staples removed -Continue minimal WBAT in the cam boots with the assistance of a walker and wheelchair -Return to clinic 2 weeks  Thresa EMERSON Sar, DPM [facility]  Dr. Thresa EMERSON Sar, DPM    [address] [place], [place] [address]                Office [phone]  Fax [phone]

## 2023-11-01 ENCOUNTER — Other Ambulatory Visit: Payer: Self-pay | Admitting: Internal Medicine

## 2023-11-01 ENCOUNTER — Other Ambulatory Visit: Payer: Self-pay | Admitting: Podiatry

## 2023-11-14 ENCOUNTER — Ambulatory Visit (INDEPENDENT_AMBULATORY_CARE_PROVIDER_SITE_OTHER): Payer: 59 | Admitting: Podiatry

## 2023-11-14 ENCOUNTER — Encounter: Payer: Self-pay | Admitting: Podiatry

## 2023-11-14 DIAGNOSIS — Z860100 Personal history of colon polyps, unspecified: Secondary | ICD-10-CM | POA: Insufficient documentation

## 2023-11-14 DIAGNOSIS — K219 Gastro-esophageal reflux disease without esophagitis: Secondary | ICD-10-CM | POA: Insufficient documentation

## 2023-11-14 DIAGNOSIS — R1012 Left upper quadrant pain: Secondary | ICD-10-CM | POA: Insufficient documentation

## 2023-11-14 DIAGNOSIS — M722 Plantar fascial fibromatosis: Secondary | ICD-10-CM

## 2023-11-14 NOTE — Progress Notes (Signed)
   Chief Complaint  Patient presents with   Routine Post Op    POV #3, DOS 10/18/23, ENDOSCOPIC PLANTAR FASCIOTOMY BILATERAL, GASTROC APONEUROSIS LENGTHENING BILATERAL   "Its been rough walking in 2 boots, but it was worth it. Little tender around the incisions and my feet are still numb"    Subjective:  Patient presents today status post bilateral EPF and gastroc aponeurosis lengthening.  DOS: 10/18/2023.  Patient doing well.  She has been WBAT in the cam boots with the assistance of a walker.  Doing well with no new complaints  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   Diabetes mellitus    Dysrhythmia    1985   Heart murmur    HLD (hyperlipidemia)    Hypertension    Kidney stone on right side 03/2013   Pneumonia    7 yrs ago    Past Surgical History:  Procedure Laterality Date   ABDOMINAL HYSTERECTOMY  2001   BACK SURGERY     CERVICAL SPINE SURGERY  06/06/2019   Dr. Franky Macho, performed at surgical center   CESAREAN SECTION  1990   COLONOSCOPY     Dr. Arlyce Dice normal   LEFT HEART CATH AND CORONARY ANGIOGRAPHY N/A 11/03/2021   Procedure: LEFT HEART CATH AND CORONARY ANGIOGRAPHY;  Surgeon: Yvonne Kendall, MD;  Location: [facility];  Service: Cardiovascular;  Laterality: N/A;   LUMBAR LAMINECTOMY/DECOMPRESSION MICRODISCECTOMY  03/20/2012   Procedure: LUMBAR LAMINECTOMY/DECOMPRESSION MICRODISCECTOMY;  Surgeon: Emilee Hero, MD;  Location: [facility] OR;  Service: Orthopedics;  Laterality: Left;  Left sided lumbar 4-5 microdisectomy   SPINE SURGERY  2013    Allergies  Allergen Reactions   Lisinopril Cough   Atorvastatin Other (See Comments)   Hydrochlorothiazide Other (See Comments)    pancreatitis    Objective/Physical Exam Neurovascular status intact.  Incisions nicely healed.  No edema to the lower extremities bilateral.  Minimal tenderness with palpation along the posterior gastroc incision site as well as the plantar  heels  Assessment: 1. s/p EPF + gastroc aponeurosis lengthening B/L. DOS: 10/18/2023   Plan of Care:  -Patient was evaluated.  - Overall the patient is doing well.  She may now transition out of the cam boot into good supportive tennis shoes and sneakers -When the patient transitions into tennis shoes recommend continued assistance with the walker until she is comfortable to discontinue the walker -Physical therapy was offered for the patient but ultimately declined for now.  She says if she has a hard time recovering or with gait stability she will reach out to initiate physical therapy -Return to clinic 8 weeks  Felecia Shelling, DPM [facility]  Dr. Felecia Shelling, DPM    [address] [place], [place] [address]                Office [phone]  Fax [phone]

## 2023-12-05 ENCOUNTER — Ambulatory Visit (INDEPENDENT_AMBULATORY_CARE_PROVIDER_SITE_OTHER): Payer: BC Managed Care – PPO | Admitting: Internal Medicine

## 2023-12-05 ENCOUNTER — Encounter: Payer: Self-pay | Admitting: Internal Medicine

## 2023-12-05 VITALS — BP 124/80 | HR 66 | Ht 61.0 in | Wt 166.4 lb

## 2023-12-05 DIAGNOSIS — E1142 Type 2 diabetes mellitus with diabetic polyneuropathy: Secondary | ICD-10-CM

## 2023-12-05 DIAGNOSIS — E1129 Type 2 diabetes mellitus with other diabetic kidney complication: Secondary | ICD-10-CM | POA: Diagnosis not present

## 2023-12-05 DIAGNOSIS — Z794 Long term (current) use of insulin: Secondary | ICD-10-CM

## 2023-12-05 DIAGNOSIS — E785 Hyperlipidemia, unspecified: Secondary | ICD-10-CM

## 2023-12-05 DIAGNOSIS — R809 Proteinuria, unspecified: Secondary | ICD-10-CM

## 2023-12-05 DIAGNOSIS — E1165 Type 2 diabetes mellitus with hyperglycemia: Secondary | ICD-10-CM

## 2023-12-05 LAB — LIPID PANEL
Cholesterol: 188 mg/dL (ref <200)
HDL: 47 mg/dL — ABNORMAL LOW (ref >=50)
LDL Cholesterol (Calc): 121 mg/dL — ABNORMAL HIGH
Non-HDL Cholesterol (Calc): 141 mg/dL — ABNORMAL HIGH (ref <130)
Total CHOL/HDL Ratio: 4 (calc) (ref <5.0)
Triglycerides: 92 mg/dL (ref <150)

## 2023-12-05 LAB — POCT GLYCOSYLATED HEMOGLOBIN (HGB A1C): Hemoglobin A1C: 7.5 % — AB (ref 4.0–5.6)

## 2023-12-05 LAB — MICROALBUMIN / CREATININE URINE RATIO
Creatinine, Urine: 68 mg/dL (ref 20–275)
Microalb Creat Ratio: 604 mg/g{creat} — ABNORMAL HIGH (ref <30)
Microalb, Ur: 41.1 mg/dL

## 2023-12-05 LAB — COMPREHENSIVE METABOLIC PANEL
AG Ratio: 1.4 (calc) (ref 1.0–2.5)
ALT: 12 U/L (ref 6–29)
AST: 15 U/L (ref 10–35)
Albumin: 4.6 g/dL (ref 3.6–5.1)
Alkaline phosphatase (APISO): 130 U/L (ref 37–153)
BUN/Creatinine Ratio: 16 (calc) (ref 6–22)
BUN: 19 mg/dL (ref 7–25)
CO2: 24 mmol/L (ref 20–32)
Calcium: 9.9 mg/dL (ref 8.6–10.4)
Chloride: 105 mmol/L (ref 98–110)
Creat: 1.17 mg/dL — ABNORMAL HIGH (ref 0.50–1.05)
Globulin: 3.2 g/dL (ref 1.9–3.7)
Glucose, Bld: 107 mg/dL — ABNORMAL HIGH (ref 65–99)
Potassium: 3.9 mmol/L (ref 3.5–5.3)
Sodium: 140 mmol/L (ref 135–146)
Total Bilirubin: 0.6 mg/dL (ref 0.2–1.2)
Total Protein: 7.8 g/dL (ref 6.1–8.1)

## 2023-12-05 MED ORDER — INSULIN PEN NEEDLE 32G X 4 MM MISC: 1.0000 | Freq: Every day | 3 refills | Status: DC

## 2023-12-05 MED ORDER — TRESIBA FLEXTOUCH 200 UNIT/ML ~~LOC~~ SOPN: 22.0000 [IU] | PEN_INJECTOR | Freq: Every day | SUBCUTANEOUS | 3 refills | Status: DC

## 2023-12-05 MED ORDER — DAPAGLIFLOZIN PRO-METFORMIN ER 5-1000 MG PO TB24: 2.0000 | ORAL_TABLET | Freq: Every day | ORAL | 3 refills | Status: DC

## 2023-12-05 MED ORDER — GLIMEPIRIDE 4 MG PO TABS: 4.0000 mg | ORAL_TABLET | Freq: Every day | ORAL | 3 refills | Status: DC

## 2023-12-05 NOTE — Progress Notes (Unsigned)
 Name: Teresa Grant  MRN/ DOB: [id number], 03-Aug-1961   Age/ Sex: 63 y.o., female    PCP: Teresa Peng, MD   Reason for Endocrinology Evaluation: Type 2 Diabetes Mellitus     Date of Initial Endocrinology Visit: 06/15/2021    PATIENT IDENTIFIER: [name] is a 63 y.o. female with a past medical history of T2DM, HTN and Dyslipidemia . The patient presented for initial endocrinology clinic visit on 06/15/2021 for consultative assistance with her diabetes management.    HPI: Teresa Grant was    Diagnosed with DM 2008 Prior Medications tried/Intolerance: Glipizide , januvia  Hemoglobin A1c has ranged from 6.8% in 2020, peaking at 11.2% in 2022.   On her initial visit to our clinic she had an A1c of 8.5%, we continued Xigduo, increase Ozempic, and decrease basal insulin   Stopped Ozempic 07/2022 due to hx of pancreatitis , presenting to ED 06/2022 with N/V and slight elevation in lipase  Started glimepiride 01/2023 due to increased A1c from 6.4% to 11.4%   She met with Teresa Grant 08/2023  SUBJECTIVE:   During the last visit (09/04/2023): A1c was 9.1%   Today (12/06/23): Teresa Grant is here for follow-up on diabetes management.  She checks her blood sugars multiple times daily through CGM.    Patient is s/p endoscopic plantar fasciotomy bilaterally 09/2023, continues to follow-up with podiatry  Denies nausea or vomiting  Continues with abdominal pain, has a pending appointment with GI She did have a recent bout of constipation following feet surgery but her bowels are back to normal  HOME DIABETES REGIMEN: Glimepiride 4 mg daily Xigduo 01-999 mg twice daily  Tresiba 24 units daily  Atorvastatin 40 mg daily    Statin: yes ACE-I/ARB: Intolerant to Lisinopril due to cough    CONTINUOUS GLUCOSE MONITORING RECORD INTERPRETATION    Dates of Recording: 2/27-3/08/2024  Sensor description:dexcom  Results statistics:   CGM use % of time 92   Average and SD 180/72  Time in range       55 %  % Time Above 180 25  % Time above 250 19  % Time Below target 1   Glycemic patterns summary: BGs are optimal overnight and fluctuate during the day  Hyperglycemic episodes postprandial  Hypoglycemic episodes occurred rare without a pattern  Overnight periods: Mostly optimal    DIABETIC COMPLICATIONS: Microvascular complications:   Denies: CKD, retinopathy, neuropathy  Last eye exam: Completed 01/24/2023  Macrovascular complications:   Denies: CAD, PVD, CVA   PAST HISTORY: Past Medical History:  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   Diabetes mellitus    Dysrhythmia    1985   Heart murmur    HLD (hyperlipidemia)    Hypertension    Kidney stone on right side 03/2013   Pneumonia    7 yrs ago   Past Surgical History:  Past Surgical History:  Procedure Laterality Date   ABDOMINAL HYSTERECTOMY  2001   BACK SURGERY     CERVICAL SPINE SURGERY  06/06/2019   Dr. Franky Grant, performed at surgical center   CESAREAN SECTION  1990   COLONOSCOPY     Dr. Arlyce Grant normal   LEFT HEART CATH AND CORONARY ANGIOGRAPHY N/A 11/03/2021   Procedure: LEFT HEART CATH AND CORONARY ANGIOGRAPHY;  Surgeon: Teresa Kendall, MD;  Location: [facility];  Service: Cardiovascular;  Laterality: N/A;   LUMBAR LAMINECTOMY/DECOMPRESSION MICRODISCECTOMY  03/20/2012   Procedure: LUMBAR LAMINECTOMY/DECOMPRESSION MICRODISCECTOMY;  Surgeon: Teresa Hero, MD;  Location: [facility] OR;  Service: Orthopedics;  Laterality: Left;  Left sided lumbar 4-5 microdisectomy   SPINE SURGERY  2013    Social History:  reports that she quit smoking about 19 years ago. Her smoking use included cigarettes. She started smoking about 44 years ago. She has a 12.5 pack-year smoking history. She has never used smokeless tobacco. She reports that she does not currently use alcohol. She reports that she does not use drugs. Family History:   Family History  Problem Relation Age of Onset   Hypertension Mother    Stroke Mother    Heart attack Mother 60   Heart disease Mother    Hypertension Father    Kidney disease Father    Hypertension Sister    Hypertension Maternal Aunt    Hypertension Maternal Uncle    Esophageal cancer Maternal Uncle    Diabetes Paternal Aunt    Diabetes Paternal Uncle    Colon cancer Cousin    Hypertension Brother    Cancer Neg Hx    Colon polyps Neg Hx    Rectal cancer Neg Hx    Stomach cancer Neg Hx    Breast cancer Neg Hx      HOME MEDICATIONS: Allergies as of 12/05/2023       Reactions   Lisinopril Cough   Atorvastatin Other (See Comments)   Hydrochlorothiazide Other (See Comments)   pancreatitis        Medication List        Accurate as of December 05, 2023 11:59 PM. If you have any questions, ask your nurse or doctor.          amLODipine 10 MG tablet Commonly known as: NORVASC TAKE 1 TABLET BY MOUTH DAILY   aspirin EC 81 MG tablet Take 4 tablets (325 mg total) by mouth daily. Swallow whole.   atorvastatin 40 MG tablet Commonly known as: Lipitor Take 1 tablet (40 mg total) by mouth daily. Take 1 tablet daily, skip Sundays.   BC Fast Pain Relief 650-195-33.3 MG Pack Generic drug: Aspirin-Salicylamide-Caffeine Take 1 packet by mouth daily as needed (pain.).   Dapagliflozin Pro-metFORMIN ER 01-999 MG Tb24 Commonly known as: Xigduo XR Take 2 tablets by mouth daily before supper. What changed: when to take this Changed by: Teresa Grant   Dexcom G7 Sensor Misc Change sensors  every 10 days   famotidine 20 MG tablet Commonly known as: PEPCID TAKE 1 TABLET BY MOUTH TWICE A DAY   glimepiride 4 MG tablet Commonly known as: AMARYL Take 1 tablet (4 mg total) by mouth daily before supper. What changed: when to take this Changed by: Teresa Grant Teresa Grant   ibuprofen 800 MG tablet Commonly known as: ADVIL Take 1 tablet (800 mg total) by mouth 3 (three)  times daily.   Insulin Pen Needle 32G X 4 MM Misc 1 Device by Does not apply route daily in the afternoon.   Linzess 290 MCG Caps capsule Generic drug: linaclotide Take 290 mcg by mouth daily.   meloxicam 15 MG tablet Commonly known as: MOBIC Take 1 tablet (15 mg total) by mouth daily.   nitroGLYCERIN 0.4 MG SL tablet Commonly known as: NITROSTAT Place 1 tablet (0.4 mg total) under the tongue every 5 (five) minutes as needed for chest pain. UP TO 3 TABLETS   OneTouch Verio test strip Generic drug: glucose blood Use as directed to check blood sugars 2 times per day dx: e11.65   oxyCODONE-acetaminophen 5-325  MG tablet Commonly known as: Percocet Take 1 tablet by mouth every 4 (four) hours as needed for severe pain (pain score 7-10).   telmisartan 20 MG tablet Commonly known as: MICARDIS Take 1 tablet (20 mg total) by mouth every evening.   Evaristo Bury FlexTouch 200 UNIT/ML FlexTouch Pen Generic drug: insulin degludec Inject 22 Units into the skin at bedtime. What changed: how much to take Changed by: Teresa Grant Brighton Pilley   VITAMIN C PO Take 500 mg by mouth every evening.   Vitamin D (Ergocalciferol) 1.25 MG (50000 UNIT) Caps capsule Commonly known as: DRISDOL TAKE 1 CAPSULE BY MOUTH ONCE WEEKLY   ZINCATE PO Take 50 mg by mouth every evening.         ALLERGIES: Allergies  Allergen Reactions   Lisinopril Cough   Atorvastatin Other (See Comments)   Hydrochlorothiazide Other (See Comments)    pancreatitis     REVIEW OF SYSTEMS: A comprehensive ROS was conducted with the patient and is negative except as per HPI     OBJECTIVE:   VITAL SIGNS:BP 124/80 (BP Location: Left Arm, Patient Position: Sitting, Cuff Size: Small)   Pulse 66   Ht 5\' 1"  (1.549 m)   Wt 166 lb 6.4 oz (75.5 kg)   SpO2 99%   BMI 31.44 kg/m    PHYSICAL EXAM:  General: Pt appears well and is in NAD  Lungs: Clear with good BS bilat   Heart: RRR   Extremities:  Lower extremities - No  pretibial edema  Neuro: MS is good with appropriate affect, pt is alert and Ox3    DM foot exam: 11/14/2023 per podiatry     DATA REVIEWED:  Lab Results  Component Value Date   HGBA1C 7.5 (A) 12/05/2023   HGBA1C 8.0 (H) 09/17/2023   HGBA1C 9.1 (H) 07/18/2023    Latest Reference Range & Units 12/05/23 10:01  Sodium 135 - 146 mmol/L 140  Potassium 3.5 - 5.3 mmol/L 3.9  Chloride 98 - 110 mmol/L 105  CO2 20 - 32 mmol/L 24  Glucose 65 - 99 mg/dL 161 (H)  BUN 7 - 25 mg/dL 19  Creatinine 0.96 - 0.45 mg/dL 4.09 (H)  Calcium 8.6 - 10.4 mg/dL 9.9  BUN/Creatinine Ratio 6 - 22 (calc) 16  AG Ratio 1.0 - 2.5 (calc) 1.4  AST 10 - 35 U/L 15  ALT 6 - 29 U/L 12  Total Protein 6.1 - 8.1 g/dL 7.8  Total Bilirubin 0.2 - 1.2 mg/dL 0.6    Latest Reference Range & Units 12/05/23 10:01  Total CHOL/HDL Ratio <5.0 (calc) 4.0  Cholesterol <200 mg/dL 811  HDL Cholesterol > OR = 50 mg/dL 47 (L)  LDL Cholesterol (Calc) mg/dL (calc) 914 (H)  MICROALB/CREAT RATIO <30 mg/g creat 604 (H)  Non-HDL Cholesterol (Calc) <130 mg/dL (calc) 782 (H)  Triglycerides <150 mg/dL 92  (L): Data is abnormally low (H): Data is abnormally high   ASSESSMENT / PLAN / RECOMMENDATIONS:   1) Type 2 Diabetes Mellitus,Sub-optimally controlled, With Neuropathic and complications and microabuminuria - Most recent A1c of 7.5 %. Goal A1c < 7.0 %.    -A1c has trended down from 9.1% to 7.5% -She was on glipizide in the past, which was discontinued due to hypoglycemia -I stopped Ozempic due to history of pancreatitis and chronic abdominal pain -Unfortunately, she has been taking glimepiride in the morning but a lot of times she would not eat breakfast which has resulted in hypoglycemia.  The patient typically eats supper, and I  have advised her to take glimepiride in the 2 tablets of 6 0 before supper from now on  -I will reduce insulin as below   MEDICATIONS:  Take glimepiride 4 mg daily before supper Continue Xigduo 01-999  mg 2 tabs before. Decrease Tresiba to 22 units daily   EDUCATION / INSTRUCTIONS: BG monitoring instructions: Patient is instructed to check her blood sugars 1 times a day, fasting . Call [place] [facility] if: BG persistently < 70  I reviewed the Rule of 15 for the treatment of hypoglycemia in detail with the patient. Literature supplied.   2) Diabetic complications:  Eye: Does not have known diabetic retinopathy.  Neuro/ Feet: Does  have known diabetic peripheral neuropathy based on exam 9/21/202 Renal: Patient does not have known baseline CKD. She is  on an ACEI/ARB at present.  3) Dyslipidemia:    - LDL was above goal at 100 mg/dL in 4098, We switched pravastatin to  Atorvastatin  -LDL remains above goal, will encourage compliance and low-fat diet -Will switch atorvastatin to rosuvastatin   Medication   Stop atorvastatin 40 mg daily  Start rosuvastatin 40 mg daily   4) Microalbuminuria:  -This is worsening in nature -Will increase telmisartan from 20 mg to 40 mg   Medication Stopped olmesartan 20 mg Start telmisartan 40 mg    F/U in 3 months    Signed electronically by: Lyndle Herrlich, MD  [facility] Endocrinology  [facility] [address] [place], [place] [address] Phone: [phone] FAX: [phone]   CC: Teresa Peng, MD [address] [place] [address] Phone: [phone]  Fax: [phone]    Return to Endocrinology clinic as below: Future Appointments  Date Time Provider Department Center  12/11/2023  2:10 PM Armbruster, Willaim Rayas, MD [facility]  12/25/2023  2:00 PM Teresa Peng, MD [facility] None  01/14/2024  9:15 AM Felecia Shelling, DPM [facility]  04/14/2024  8:30 AM Laszlo Ellerby, Konrad Dolores, MD [facility] None

## 2023-12-05 NOTE — Patient Instructions (Addendum)
 Glimepiride 4 mg, 1 tablet before Supper Xigduo 01-999 mg, 2 tablets before supper Decrease Tresiba 22 units daily    HOW TO TREAT LOW BLOOD SUGARS (Blood sugar LESS THAN 70 MG/DL) Please follow the RULE OF 15 for the treatment of hypoglycemia treatment (when your (blood sugars are less than 70 mg/dL)   STEP 1: Take 15 grams of carbohydrates when your blood sugar is low, which includes:  3-4 GLUCOSE TABS  OR 3-4 OZ OF JUICE OR REGULAR SODA OR ONE TUBE OF GLUCOSE GEL    STEP 2: RECHECK blood sugar in 15 MINUTES STEP 3: If your blood sugar is still low at the 15 minute recheck --> then, go back to STEP 1 and treat AGAIN with another 15 grams of carbohydrates.

## 2023-12-06 ENCOUNTER — Encounter: Payer: Self-pay | Admitting: Internal Medicine

## 2023-12-06 DIAGNOSIS — E1129 Type 2 diabetes mellitus with other diabetic kidney complication: Secondary | ICD-10-CM | POA: Insufficient documentation

## 2023-12-06 MED ORDER — ROSUVASTATIN CALCIUM 40 MG PO TABS
40.0000 mg | ORAL_TABLET | Freq: Every day | ORAL | 3 refills | Status: DC
Start: 2023-12-06 — End: 2024-07-15

## 2023-12-06 MED ORDER — TELMISARTAN 40 MG PO TABS: 40.0000 mg | ORAL_TABLET | Freq: Every day | ORAL | 3 refills | Status: DC

## 2023-12-11 ENCOUNTER — Ambulatory Visit: Payer: BC Managed Care – PPO | Admitting: Gastroenterology

## 2023-12-11 ENCOUNTER — Encounter: Payer: Self-pay | Admitting: Gastroenterology

## 2023-12-11 VITALS — BP 120/60 | HR 70 | Ht 61.0 in | Wt 164.1 lb

## 2023-12-11 DIAGNOSIS — R140 Abdominal distension (gaseous): Secondary | ICD-10-CM | POA: Diagnosis not present

## 2023-12-11 DIAGNOSIS — Z860100 Personal history of colon polyps, unspecified: Secondary | ICD-10-CM

## 2023-12-11 DIAGNOSIS — R194 Change in bowel habit: Secondary | ICD-10-CM | POA: Diagnosis not present

## 2023-12-11 MED ORDER — IBGARD 90 MG PO CPCR: ORAL_CAPSULE | ORAL | 0 refills | Status: AC

## 2023-12-11 MED ORDER — CITRUCEL PO POWD: ORAL | Status: AC

## 2023-12-11 NOTE — Patient Instructions (Addendum)
 Please purchase the following medications over the counter and take as directed: Citrucel - sugar free - Take once daily  We have given you samples of the following medication to take: IBgard - use as directed as needed  Give [place] an update in a couple of weeks.  We will request your records from Dr. Kenna Gilbert office of your colonoscopy in 10-2022.  Thank you for entrusting me with your care and for choosing [facility], Dr. Ileene Patrick    If your blood pressure at your visit was 140/90 or greater, please contact your primary care physician to follow up on this. ______________________________________________________  If you are age 63 or older, your body mass index should be between 23-30. Your Body mass index is 31.01 kg/m. If this is out of the aforementioned range listed, please consider follow up with your Primary Care Provider.  If you are age 63 or younger, your body mass index should be between 19-25. Your Body mass index is 31.01 kg/m. If this is out of the aformentioned range listed, please consider follow up with your Primary Care Provider.  ________________________________________________________  The [facility] providers would like to encourage you to use [facility] to communicate with providers for non-urgent requests or questions.  Due to long hold times on the telephone, sending your provider a message by [facility] may be a faster and more efficient way to get a response.  Please allow 48 business hours for a response.  Please remember that this is for non-urgent requests.  _______________________________________________________  Due to recent changes in healthcare laws, you may see the results of your imaging and laboratory studies on MyChart before your provider has had a chance to review them.  We understand that in some cases there may be results that are confusing or concerning to you. Not all laboratory results come back in the same time frame and the provider may  be waiting for multiple results in order to interpret others.  Please give [place] 48 hours in order for your provider to thoroughly review all the results before contacting the office for clarification of your results.

## 2023-12-11 NOTE — Progress Notes (Signed)
 HPI :  63 year old female with a history of colon polyps, diabetes, hyperlipidemia, here to reestablish care for altered bowel habits.  I have not seen her since November 2018.  Referred by Dorothyann Peng MD.  She has had some altered bowel habits for some time.  She states some time she can go several days without having a bowel movement, and then will have loose stools at times and goes back and forth between the 2.  She states she has a lot of bloating and distention in her abdomen when she does not have a bowel movement and "feels like she is having a baby".  This has been going on for some years.  She typically does not have any blood in her stool, perhaps she had it once in the past several months.  She states a few months ago she had several light/pale-colored stools.  It lasted for several days and then resolved on its own.  She could not think of anything that she ate or ingested to be causing that.  She had liver function testing done on March 12 which was normal, normal alk phos and bilirubin.  I performed a colonoscopy for her in 2018 she had 2 sessile serrated polyps removed.  It sounds like she had a colonoscopy with Dr. Loreta Ave performed last year, perhaps within the past few months but we have no report of that on file.  She states her bowel symptoms preceded use of Xigduo which she takes for her diabetes.  Denies any relationship between her medications and her bowel habits otherwise, no other new changes.  She denies any NSAID use.  No anticoagulants.  She is not taking anything for her bowels currently.  She denies any trials of fiber supplements in the past or other laxatives recently.  We discussed options.  She did otherwise denies any significant abdominal pains that bother her.  No nausea or vomiting.  Weight stable.  No family history of colon cancer    Prior workup: Colonoscopy 07/30/2017 - - The perianal and digital rectal examinations were normal. - A 4 mm polyp was found  in the ascending colon. The polyp was flat. The polyp was removed with a cold snare. Resection and retrieval were complete. - A 3 mm polyp was found in the transverse colon. The polyp was sessile. The polyp was removed with a cold snare. Resection and retrieval were complete. - Anal papilla(e) were hypertrophied. - The exam was otherwise without abnormality.  Surgical [P], ascending, transverse, polyp (2) - SESSILE SERRATED POLYP (X2 FRAGMENTS). - NO DYSPLASIA OR MALIGNANCY.    Cardiac cath 11/03/2021: Conclusions: Minimal plaquing of the proximal and mid RCA without angiographically significant stenosis in any coronary artery to explain the patient's chest pain. Normal left ventricular filling pressure.    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   Diabetes mellitus    Dysrhythmia    1985   Heart murmur    HLD (hyperlipidemia)    Hypertension    Kidney stone on right side 03/2013   Pneumonia    7 yrs ago     Past Surgical History:  Procedure Laterality Date   ABDOMINAL HYSTERECTOMY  2001   BACK SURGERY     CERVICAL SPINE SURGERY  06/06/2019   Dr. Franky Macho, performed at surgical center   CESAREAN SECTION  1990   COLONOSCOPY     Dr. Arlyce Dice normal   FOOT SURGERY Bilateral    LEFT HEART  CATH AND CORONARY ANGIOGRAPHY N/A 11/03/2021   Procedure: LEFT HEART CATH AND CORONARY ANGIOGRAPHY;  Surgeon: Yvonne Kendall, MD;  Location: [facility];  Service: Cardiovascular;  Laterality: N/A;   LUMBAR LAMINECTOMY/DECOMPRESSION MICRODISCECTOMY  03/20/2012   Procedure: LUMBAR LAMINECTOMY/DECOMPRESSION MICRODISCECTOMY;  Surgeon: Emilee Hero, MD;  [facility];  Service: Orthopedics;  Laterality: Left;  Left sided lumbar 4-5 microdisectomy   SPINE SURGERY  2013   Family History  Problem Relation Age of Onset   Hypertension Mother    Stroke Mother    Heart attack Mother 9   Heart disease Mother    Hypertension Father    Kidney disease  Father    Hypertension Sister    Colon polyps Sister    Hypertension Brother    Hypertension Maternal Aunt    Hypertension Maternal Uncle    Esophageal cancer Maternal Uncle    Diabetes Paternal Aunt    Diabetes Paternal Uncle    Colon cancer Cousin    Cancer Neg Hx    Rectal cancer Neg Hx    Stomach cancer Neg Hx    Breast cancer Neg Hx    Social History   Tobacco Use   Smoking status: Former    Current packs/day: 0.00    Average packs/day: 0.5 packs/day for 25.0 years (12.5 ttl pk-yrs)    Types: Cigarettes    Start date: 04/30/1979    Quit date: 04/29/2004    Years since quitting: 19.6   Smokeless tobacco: Never  Vaping Use   Vaping status: Never Used  Substance Use Topics   Alcohol use: Not Currently    Comment: rare   Drug use: No   Current Outpatient Medications  Medication Sig Dispense Refill   amLODipine (NORVASC) 10 MG tablet TAKE 1 TABLET BY MOUTH DAILY 90 tablet 0   Ascorbic Acid (VITAMIN C PO) Take 500 mg by mouth every evening.     aspirin EC 81 MG tablet Take 4 tablets (325 mg total) by mouth daily. Swallow whole. 30 tablet 0   Continuous Glucose Sensor (DEXCOM G7 SENSOR) MISC Change sensors  every 10 days 9 each 1   Dapagliflozin Pro-metFORMIN ER (XIGDUO XR) 01-999 MG TB24 Take 2 tablets by mouth daily before supper. 180 tablet 3   famotidine (PEPCID) 20 MG tablet TAKE 1 TABLET BY MOUTH TWICE A DAY 120 tablet 0   glimepiride (AMARYL) 4 MG tablet Take 1 tablet (4 mg total) by mouth daily before supper. 90 tablet 3   glucose blood (ONETOUCH VERIO) test strip Use as directed to check blood sugars 2 times per day dx: e11.65 100 each 10   ibuprofen (ADVIL) 800 MG tablet Take 1 tablet (800 mg total) by mouth 3 (three) times daily. 60 tablet 1   insulin degludec (TRESIBA FLEXTOUCH) 200 UNIT/ML FlexTouch Pen Inject 22 Units into the skin at bedtime. 30 mL 3   Insulin Pen Needle 32G X 4 MM MISC 1 Device by Does not apply route daily in the afternoon. 100 each 3    nitroGLYCERIN (NITROSTAT) 0.4 MG SL tablet Place 1 tablet (0.4 mg total) under the tongue every 5 (five) minutes as needed for chest pain. UP TO 3 TABLETS 25 tablet PRN   rosuvastatin (CRESTOR) 40 MG tablet Take 1 tablet (40 mg total) by mouth daily. 90 tablet 3   telmisartan (MICARDIS) 40 MG tablet Take 1 tablet (40 mg total) by mouth daily. 90 tablet 3   Vitamin D, Ergocalciferol, (DRISDOL) 1.25 MG (50000  UNIT) CAPS capsule TAKE 1 CAPSULE BY MOUTH ONCE WEEKLY 12 capsule 1   Zinc Sulfate (ZINCATE PO) Take 50 mg by mouth every evening.     No current facility-administered medications for this visit.   Allergies  Allergen Reactions   Lisinopril Cough   Atorvastatin Other (See Comments)   Hydrochlorothiazide Other (See Comments)    pancreatitis     Review of Systems: All systems reviewed and negative except where noted in HPI.      Physical Exam: BP 120/60   Pulse 70   Ht 5\' 1"  (1.549 m)   Wt 164 lb 1.6 oz (74.4 kg)   SpO2 98%   BMI 31.01 kg/m  Constitutional: Pleasant,well-developed, female in no acute distress. HEENT: Normocephalic and atraumatic. Conjunctivae are normal. No scleral icterus. Neck supple.  Cardiovascular: Normal rate, regular rhythm.  Pulmonary/chest: Effort normal and breath sounds normal.  Abdominal: Soft, nondistended, nontender. Mild diastasis recti upper abdomen. There are no masses palpable.  Extremities: no edema Lymphadenopathy: No cervical adenopathy noted. Neurological: Alert and oriented to person place and time. Skin: Skin is warm and dry. No rashes noted. Psychiatric: Normal mood and affect. Behavior is normal.   ASSESSMENT: 63 y.o. female here for assessment of the following  1. Altered bowel habits   2. Abdominal bloating   3. History of colon polyps    Colonoscopy reportedly up-to-date in recent months, need to get that on file for our records.  Sounds like she has longstanding altered bowel habits, alternating constipation with loose  stools, associated with bloating and abdominal discomfort if she is not moving her bowels regularly.  We discussed options.  Recommend Citrucel once daily, sugar-free, to see if this can provide some regularity for her.  Gave her some samples of IBgard to use as needed for bloating and discomfort to see if that helps her.  If she finds that on Citrucel she is not moving her bowels frequently enough, can add MiraLAX or switch to it pending her course.  Otherwise discussed her stool color change.  Had several stools that were light in color, however LFTs normal, this has resolved without any specific intervention.  Could likely have resulted from an ingestion although she cannot recall what she was eating around that time to have caused the change.  She will keep an eye on this and let us know should this happen again.  Otherwise await report from colonoscopy that was done recently to ensure we have updated records and that the prep was adequate and nothing else needs to be done at this time.  I asked her to contact me a few weeks if no improvement so we can adjust the regimen as needed   PLAN: - get colonoscopy report from Dr. Kenna Gilbert office - start Citrucel - suger free - once daily - add IB gard samples to use PRN - contact me in a few weeks if no benefit, will add Miralax - counseled on stool color change. LFTs NORMAL, she will keep an eye on this and contact me if it recurs.  Teresa Rain, MD [facility]  CC: Dorothyann Peng, MD

## 2023-12-20 ENCOUNTER — Telehealth: Payer: Self-pay | Admitting: Gastroenterology

## 2023-12-20 NOTE — Telephone Encounter (Signed)
 Colonoscopy results arrived as outlined  Colonoscopy 11/22/2022-Dr. Mann-good bowel prep, normal colon, normal ileum, no polyps.  She was told to repeat colonoscopy in 10 years.   Jan can you place a recall for February 2034 for this patient?  Thanks

## 2023-12-20 NOTE — Telephone Encounter (Signed)
 Recall updated to 10-2032

## 2023-12-25 ENCOUNTER — Ambulatory Visit: Payer: BC Managed Care – PPO | Admitting: Internal Medicine

## 2023-12-25 ENCOUNTER — Encounter: Payer: Self-pay | Admitting: Internal Medicine

## 2023-12-25 VITALS — BP 120/70 | HR 81 | Temp 97.9°F | Ht 61.0 in | Wt 166.0 lb

## 2023-12-25 DIAGNOSIS — H6121 Impacted cerumen, right ear: Secondary | ICD-10-CM

## 2023-12-25 DIAGNOSIS — Z794 Long term (current) use of insulin: Secondary | ICD-10-CM

## 2023-12-25 DIAGNOSIS — Z23 Encounter for immunization: Secondary | ICD-10-CM | POA: Diagnosis not present

## 2023-12-25 DIAGNOSIS — E1142 Type 2 diabetes mellitus with diabetic polyneuropathy: Secondary | ICD-10-CM | POA: Diagnosis not present

## 2023-12-25 DIAGNOSIS — I25118 Atherosclerotic heart disease of native coronary artery with other forms of angina pectoris: Secondary | ICD-10-CM

## 2023-12-25 DIAGNOSIS — I1310 Hypertensive heart and chronic kidney disease without heart failure, with stage 1 through stage 4 chronic kidney disease, or unspecified chronic kidney disease: Secondary | ICD-10-CM | POA: Diagnosis not present

## 2023-12-25 DIAGNOSIS — N1831 Chronic kidney disease, stage 3a: Secondary | ICD-10-CM | POA: Diagnosis not present

## 2023-12-25 DIAGNOSIS — E6609 Other obesity due to excess calories: Secondary | ICD-10-CM

## 2023-12-25 DIAGNOSIS — Z0000 Encounter for general adult medical examination without abnormal findings: Secondary | ICD-10-CM | POA: Insufficient documentation

## 2023-12-25 DIAGNOSIS — E66811 Obesity, class 1: Secondary | ICD-10-CM

## 2023-12-25 DIAGNOSIS — Z6831 Body mass index (BMI) 31.0-31.9, adult: Secondary | ICD-10-CM

## 2023-12-25 DIAGNOSIS — I700 Atherosclerosis of aorta: Secondary | ICD-10-CM

## 2023-12-25 MED ORDER — AMLODIPINE BESYLATE 10 MG PO TABS: 10.0000 mg | ORAL_TABLET | Freq: Every day | ORAL | 2 refills | Status: DC

## 2023-12-25 NOTE — Assessment & Plan Note (Signed)
 Chronic, she is encouraged to keep BP well controlled and to stay well hydrated to decrease risk of CKD progression.

## 2023-12-25 NOTE — Assessment & Plan Note (Signed)
 Chronic, diabetic foot exam was performed.  She is also followed by Endo. Their input is appreciated. She reports improved dietary choices. She has not been able to exercise recently. She will continue with Xigduo 5/1000mg  2 tabs daily, glimepiride 2mg  daily and Tresiba 24 units nightly. I DISCUSSED WITH THE PATIENT AT LENGTH REGARDING THE GOALS OF GLYCEMIC CONTROL AND POSSIBLE LONG-TERM COMPLICATIONS.  I  ALSO STRESSED THE IMPORTANCE OF COMPLIANCE WITH HOME GLUCOSE MONITORING, DIETARY RESTRICTIONS INCLUDING AVOIDANCE OF SUGARY DRINKS/PROCESSED FOODS,  ALONG WITH REGULAR EXERCISE.  I  ALSO STRESSED THE IMPORTANCE OF ANNUAL EYE EXAMS, SELF FOOT CARE AND COMPLIANCE WITH OFFICE VISITS.

## 2023-12-25 NOTE — Assessment & Plan Note (Signed)
 Chronic, she is now on rosuvastatin 40mg  daily as per Endo. LDL goal is less than 70. She is encouraged to c/w statin therapy and follow a heart healthy lifestyle.

## 2023-12-25 NOTE — Patient Instructions (Signed)

## 2023-12-25 NOTE — Assessment & Plan Note (Signed)
 Chronic, she has had Cardiology evaluation. LDL goal is less than 70.  She will continue with aspirin and rosuvastatin 40mg  daily. She is without cp at this time.

## 2023-12-25 NOTE — Assessment & Plan Note (Signed)
 After obtaining verbal consent, her right ear was flushed by irrigation. No TM abnormalities were noted. He tolerated procedure well without any complications.

## 2023-12-25 NOTE — Assessment & Plan Note (Signed)

## 2023-12-25 NOTE — Assessment & Plan Note (Signed)
 She is encouraged to strive for BMI less than 30 to decrease cardiac risk. Advised to aim for at least 150 minutes of exercise per week.

## 2023-12-25 NOTE — Progress Notes (Signed)
 I,Jameka J Llittleton, CMA,acting as a Neurosurgeon for [name], MD.,have documented all relevant documentation on the behalf of [name], MD,as directed by  [name], MD while in the presence of [name], MD.  Subjective:    Patient ID: Teresa Grant , female    DOB: 1961-08-08 , 63 y.o.   MRN: [id number]  Chief Complaint  Patient presents with   Annual Exam   Diabetes   Hypertension    HPI  She is here today for a full physical exam. She is no longer seeing a GYN, she has had a hysterectomy.  She reports compliance with meds. She denies having any headaches, chest pain and shortness of breath.   She is also followed by Endocrinology for her diabetes. She states her sugars have greatly improved, she likes using her Dexcom G7.    Diabetes She presents for her follow-up diabetic visit. She has type 2 diabetes mellitus. Her disease course has been stable. There are no hypoglycemic associated symptoms. Pertinent negatives for diabetes include no blurred vision. There are no hypoglycemic complications. Risk factors for coronary artery disease include diabetes mellitus, dyslipidemia, hypertension, obesity, post-menopausal, sedentary lifestyle and stress. Her breakfast blood glucose is taken between 8-9 am. Her breakfast blood glucose range is generally 130-140 mg/dl. An ACE inhibitor/angiotensin II receptor blocker is being taken.  Hypertension This is a chronic problem. The current episode started more than 1 year ago. The problem has been gradually improving since onset. The problem is controlled. Pertinent negatives include no blurred vision.     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   Diabetes mellitus    Dysrhythmia    1985   Heart murmur    HLD (hyperlipidemia)    Hypertension    Kidney stone on right side 03/2013   Pneumonia    7 yrs ago     Family History  Problem Relation Age of Onset   Hypertension Mother     Stroke Mother    Heart attack Mother 40   Heart disease Mother    Hypertension Father    Kidney disease Father    Hypertension Sister    Colon polyps Sister    Hypertension Brother    Hypertension Maternal Aunt    Hypertension Maternal Uncle    Esophageal cancer Maternal Uncle    Diabetes Paternal Aunt    Diabetes Paternal Uncle    Colon cancer Cousin    Cancer Neg Hx    Rectal cancer Neg Hx    Stomach cancer Neg Hx    Breast cancer Neg Hx      Current Outpatient Medications:    Ascorbic Acid (VITAMIN C PO), Take 500 mg by mouth every evening., Disp: , Rfl:    Continuous Glucose Sensor (DEXCOM G7 SENSOR) MISC, Change sensors  every 10 days, Disp: 9 each, Rfl: 1   Dapagliflozin Pro-metFORMIN ER (XIGDUO XR) 01-999 MG TB24, Take 2 tablets by mouth daily before supper., Disp: 180 tablet, Rfl: 3   glimepiride (AMARYL) 4 MG tablet, Take 1 tablet (4 mg total) by mouth daily before supper., Disp: 90 tablet, Rfl: 3   glucose blood (ONETOUCH VERIO) test strip, Use as directed to check blood sugars 2 times per day dx: e11.65, Disp: 100 each, Rfl: 10   insulin degludec (TRESIBA FLEXTOUCH) 200 UNIT/ML FlexTouch Pen, Inject 22 Units into the skin at bedtime., Disp: 30 mL, Rfl: 3   Insulin Pen Needle  32G X 4 MM MISC, 1 Device by Does not apply route daily in the afternoon., Disp: 100 each, Rfl: 3   nitroGLYCERIN (NITROSTAT) 0.4 MG SL tablet, Place 1 tablet (0.4 mg total) under the tongue every 5 (five) minutes as needed for chest pain. UP TO 3 TABLETS, Disp: 25 tablet, Rfl: PRN   Peppermint Oil (IBGARD) 90 MG CPCR, Take as directed, Disp: 12 capsule, Rfl: 0   rosuvastatin (CRESTOR) 40 MG tablet, Take 1 tablet (40 mg total) by mouth daily., Disp: 90 tablet, Rfl: 3   telmisartan (MICARDIS) 40 MG tablet, Take 1 tablet (40 mg total) by mouth daily., Disp: 90 tablet, Rfl: 3   Vitamin D, Ergocalciferol, (DRISDOL) 1.25 MG (50000 UNIT) CAPS capsule, TAKE 1 CAPSULE BY MOUTH ONCE WEEKLY, Disp: 12 capsule,  Rfl: 1   Zinc Sulfate (ZINCATE PO), Take 50 mg by mouth every evening., Disp: , Rfl:    amLODipine (NORVASC) 10 MG tablet, Take 1 tablet (10 mg total) by mouth daily., Disp: 90 tablet, Rfl: 2   aspirin EC 81 MG tablet, Take 4 tablets (325 mg total) by mouth daily. Swallow whole. (Patient not taking: Reported on 12/25/2023), Disp: 30 tablet, Rfl: 0   ibuprofen (ADVIL) 800 MG tablet, Take 1 tablet (800 mg total) by mouth 3 (three) times daily. (Patient not taking: Reported on 12/25/2023), Disp: 60 tablet, Rfl: 1   methylcellulose (CITRUCEL) oral powder, Use as directed, daily (Patient not taking: Reported on 12/25/2023), Disp: , Rfl:    Allergies  Allergen Reactions   Lisinopril Cough   Atorvastatin Other (See Comments)   Hydrochlorothiazide Other (See Comments)    pancreatitis      The patient states she uses status post hysterectomy for birth control. No LMP recorded. Patient has had a hysterectomy.. Negative for Dysmenorrhea. Negative for: breast discharge, breast lump(s), breast pain and breast self exam. Associated symptoms include abnormal vaginal bleeding. Pertinent negatives include abnormal bleeding (hematology), anxiety, decreased libido, depression, difficulty falling sleep, dyspareunia, history of infertility, nocturia, sexual dysfunction, sleep disturbances, urinary incontinence, urinary urgency, vaginal discharge and vaginal itching. Diet regular.The patient states her exercise level is  intermittent. She just had b/l foot surgery Jan 2025, she is slowly increasing her activity level.  . The patient's tobacco use is:  Social History   Tobacco Use  Smoking Status Former   Current packs/day: 0.00   Average packs/day: 0.5 packs/day for 25.0 years (12.5 ttl pk-yrs)   Types: Cigarettes   Start date: 04/30/1979   Quit date: 04/29/2004   Years since quitting: 19.6  Smokeless Tobacco Never  . She has been exposed to passive smoke. The patient's alcohol use is:  Social History   Substance  and Sexual Activity  Alcohol Use Not Currently   Comment: rare    Review of Systems  Constitutional: Negative.   HENT: Negative.    Eyes: Negative.  Negative for blurred vision.  Respiratory: Negative.    Cardiovascular: Negative.   Gastrointestinal: Negative.   Endocrine: Negative.   Genitourinary: Negative.   Musculoskeletal: Negative.   Skin: Negative.   Allergic/Immunologic: Negative.   Neurological: Negative.   Hematological: Negative.   Psychiatric/Behavioral: Negative.       Today's Vitals   12/25/23 1408  BP: 120/70  Pulse: 81  Temp: 97.9 F (36.6 C)  TempSrc: Oral  Weight: 166 lb (75.3 kg)  Height: 5\' 1"  (1.549 m)  PainSc: 0-No pain   Body mass index is 31.37 kg/m.  Wt Readings from Last 3 Encounters:  12/25/23 166 lb (75.3 kg)  12/11/23 164 lb 1.6 oz (74.4 kg)  12/05/23 166 lb 6.4 oz (75.5 kg)     Objective:  Physical Exam Vitals and nursing note reviewed.  Constitutional:      Appearance: Normal appearance. She is not diaphoretic.  HENT:     Head: Normocephalic and atraumatic.     Right Ear: Ear canal and external ear normal. There is impacted cerumen.     Left Ear: Tympanic membrane, ear canal and external ear normal.     Mouth/Throat:     Pharynx: No oropharyngeal exudate.  Eyes:     Extraocular Movements: Extraocular movements intact.     Conjunctiva/sclera: Conjunctivae normal.     Pupils: Pupils are equal, round, and reactive to light.  Cardiovascular:     Rate and Rhythm: Normal rate and regular rhythm.     Pulses: Normal pulses.          Dorsalis pedis pulses are 2+ on the right side and 2+ on the left side.     Heart sounds: Normal heart sounds.  Pulmonary:     Effort: Pulmonary effort is normal.     Breath sounds: Normal breath sounds.  Chest:  Breasts:    Tanner Score is 5.     Right: Normal.     Left: Normal.  Abdominal:     General: Abdomen is flat. Bowel sounds are normal.     Palpations: Abdomen is soft.   Genitourinary:    Comments: Deferred  Musculoskeletal:        General: Normal range of motion.     Cervical back: Normal range of motion and neck supple.  Feet:     Right foot:     Protective Sensation: 5 sites tested.  5 sites sensed.     Skin integrity: Dry skin present.     Left foot:     Protective Sensation: 5 sites tested.  5 sites sensed.     Skin integrity: Dry skin present.     Comments: Healed surgical scars Skin:    General: Skin is warm and dry.  Neurological:     General: No focal deficit present.     Mental Status: She is alert and oriented to person, place, and time.  Psychiatric:        Mood and Affect: Mood normal.        Behavior: Behavior normal.         Assessment And Plan:     Encounter for general adult medical examination w/o abnormal findings Assessment & Plan: A full exam was performed.  Importance of monthly self breast exams was discussed with the patient.  She is advised to get 30-45 minutes of regular exercise, no less than four to five days per week. Both weight-bearing and aerobic exercises are recommended.  She is advised to follow a healthy diet with at least six fruits/veggies per day, decrease intake of red meat and other saturated fats and to increase fish intake to twice weekly.  Meats/fish should not be fried -- baked, boiled or broiled is preferable. It is also important to cut back on your sugar intake.  Be sure to read labels - try to avoid anything with added sugar, high fructose corn syrup or other sweeteners.  If you must use a sweetener, you can try stevia or monkfruit.  It is also important to avoid artificially sweetened foods/beverages and diet drinks. Lastly, wear SPF 50 sunscreen on exposed skin and when in direct  sunlight for an extended period of time.  Be sure to avoid fast food restaurants and aim for at least 60 ounces of water daily.      Orders: -     CBC -     Hepatic function panel  Type 2 diabetes mellitus with diabetic  polyneuropathy, with long-term current use of insulin (HCC) Assessment & Plan: Chronic, diabetic foot exam was performed.  She is also followed by Endo. Their input is appreciated. She reports improved dietary choices. She has not been able to exercise recently. She will continue with Xigduo 5/1000mg  2 tabs daily, glimepiride 2mg  daily and Tresiba 24 units nightly. I DISCUSSED WITH THE PATIENT AT LENGTH REGARDING THE GOALS OF GLYCEMIC CONTROL AND POSSIBLE LONG-TERM COMPLICATIONS.  I  ALSO STRESSED THE IMPORTANCE OF COMPLIANCE WITH HOME GLUCOSE MONITORING, DIETARY RESTRICTIONS INCLUDING AVOIDANCE OF SUGARY DRINKS/PROCESSED FOODS,  ALONG WITH REGULAR EXERCISE.  I  ALSO STRESSED THE IMPORTANCE OF ANNUAL EYE EXAMS, SELF FOOT CARE AND COMPLIANCE WITH OFFICE VISITS.    Hypertensive heart and renal disease with renal failure, stage 1 through stage 4 or unspecified chronic kidney disease, without heart failure Assessment & Plan: Chronic, well controlled.  EKG performed, NSR w/o acute changes.  She will continue with telmisartan 20mg  daily and amlodipine 10mg  daily. Encouraged to follow low sodium diet. She will f/u in six months for re-evaluation.    Orders: -     EKG 12-Lead  Coronary artery disease of native artery of native heart with stable angina pectoris [facility]) Assessment & Plan: Chronic, she has had Cardiology evaluation. LDL goal is less than 70.  She will continue with aspirin and rosuvastatin 40mg  daily. She is without cp at this time.    Aortic atherosclerosis (HCC) Assessment & Plan: Chronic, she is now on rosuvastatin 40mg  daily as per Endo. LDL goal is less than 70. She is encouraged to c/w statin therapy and follow a heart healthy lifestyle.     Stage 3a chronic kidney disease (HCC) Assessment & Plan: Chronic, she is encouraged to keep BP well controlled and to stay well hydrated to decrease risk of CKD progression.     Orders: -     PTH, intact and calcium -     Phosphorus -      Protein electrophoresis, serum  Right ear impacted cerumen Assessment & Plan: After obtaining verbal consent, her right ear was flushed by irrigation. No TM abnormalities were noted. He tolerated procedure well without any complications.     Orders: -     Ear Lavage  Class 1 obesity due to excess calories with serious comorbidity and body mass index (BMI) of 31.0 to 31.9 in adult Assessment & Plan: She is encouraged to strive for BMI less than 30 to decrease cardiac risk. Advised to aim for at least 150 minutes of exercise per week.    Need for pneumococcal 20-valent conjugate vaccination -     Pneumococcal conjugate vaccine 20-valent  Other orders -     amLODIPine Besylate; Take 1 tablet (10 mg total) by mouth daily.  Dispense: 90 tablet; Refill: 2     Return for 1 year physical, 6 month bp. Patient was given opportunity to ask questions. Patient verbalized understanding of the plan and was able to repeat key elements of the plan. All questions were answered to their satisfaction.   I, [name], MD, have reviewed all documentation for this visit. The documentation on 12/25/23 for the exam, diagnosis, procedures, and orders are  all accurate and complete.

## 2023-12-25 NOTE — Assessment & Plan Note (Addendum)
 Chronic, well controlled.  EKG performed, NSR w/o acute changes.  She will continue with telmisartan 20mg  daily and amlodipine 10mg  daily. Encouraged to follow low sodium diet. She will f/u in six months for re-evaluation.

## 2023-12-27 LAB — PTH, INTACT AND CALCIUM
Calcium: 10 mg/dL (ref 8.7–10.3)
PTH: 41 pg/mL (ref 15–65)

## 2023-12-27 LAB — CBC
Hematocrit: 39.8 % (ref 34.0–46.6)
Hemoglobin: 13 g/dL (ref 11.1–15.9)
MCH: 26.2 pg — ABNORMAL LOW (ref 26.6–33.0)
MCHC: 32.7 g/dL (ref 31.5–35.7)
MCV: 80 fL (ref 79–97)
Platelets: 290 10*3/uL (ref 150–450)
RBC: 4.96 x10E6/uL (ref 3.77–5.28)
RDW: 14.5 % (ref 11.7–15.4)
WBC: 7.6 10*3/uL (ref 3.4–10.8)

## 2023-12-27 LAB — PROTEIN ELECTROPHORESIS, SERUM
A/G Ratio: 0.9 (ref 0.7–1.7)
Albumin ELP: 3.7 g/dL (ref 2.9–4.4)
Alpha 1: 0.2 g/dL (ref 0.0–0.4)
Alpha 2: 1 g/dL (ref 0.4–1.0)
Beta: 1.3 g/dL (ref 0.7–1.3)
Gamma Globulin: 1.4 g/dL (ref 0.4–1.8)
Globulin, Total: 3.9 g/dL (ref 2.2–3.9)
Total Protein: 7.6 g/dL (ref 6.0–8.5)

## 2023-12-27 LAB — HEPATIC FUNCTION PANEL
ALT: 16 IU/L (ref 0–32)
AST: 20 IU/L (ref 0–40)
Albumin: 4.5 g/dL (ref 3.9–4.9)
Alkaline Phosphatase: 183 IU/L — ABNORMAL HIGH (ref 44–121)
Bilirubin Total: 0.3 mg/dL (ref 0.0–1.2)
Bilirubin, Direct: 0.13 mg/dL (ref 0.00–0.40)

## 2023-12-27 LAB — PHOSPHORUS: Phosphorus: 3.1 mg/dL (ref 3.0–4.3)

## 2023-12-31 ENCOUNTER — Encounter: Payer: Self-pay | Admitting: Internal Medicine

## 2024-01-14 ENCOUNTER — Encounter: Payer: Self-pay | Admitting: Podiatry

## 2024-01-14 ENCOUNTER — Ambulatory Visit (INDEPENDENT_AMBULATORY_CARE_PROVIDER_SITE_OTHER): Payer: 59 | Admitting: Podiatry

## 2024-01-14 VITALS — Ht 61.0 in | Wt 166.0 lb

## 2024-01-14 DIAGNOSIS — M722 Plantar fascial fibromatosis: Secondary | ICD-10-CM

## 2024-01-14 NOTE — Progress Notes (Signed)
   Chief Complaint  Patient presents with   Routine Post Op    POV #4, DOS 10/18/23, ENDOSCOPIC PLANTAR FASCIOTOMY BILATERAL, GASTROC APONEUROSIS LENGTHENING BILATERAL    Subjective:  Patient presents today status post bilateral EPF and gastroc aponeurosis lengthening.  DOS: 10/18/2023.  Patient continues to do well and she is overall very satisfied.  She is now back to walking in tennis shoes.  No new complaints  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   Diabetes mellitus    Dysrhythmia    1985   Heart murmur    HLD (hyperlipidemia)    Hypertension    Kidney stone on right side 03/2013   Pneumonia    7 yrs ago    Past Surgical History:  Procedure Laterality Date   ABDOMINAL HYSTERECTOMY  2001   BACK SURGERY     CERVICAL SPINE SURGERY  06/06/2019   Dr. Michale Age, performed at surgical center   CESAREAN SECTION  1990   COLONOSCOPY     Dr. Arvie Latus normal   FOOT SURGERY Bilateral    LEFT HEART CATH AND CORONARY ANGIOGRAPHY N/A 11/03/2021   Procedure: LEFT HEART CATH AND CORONARY ANGIOGRAPHY;  Surgeon: Sammy Crisp, MD;  Location: [facility];  Service: Cardiovascular;  Laterality: N/A;   LUMBAR LAMINECTOMY/DECOMPRESSION MICRODISCECTOMY  03/20/2012   Procedure: LUMBAR LAMINECTOMY/DECOMPRESSION MICRODISCECTOMY;  Surgeon: Estevan Helper, MD;  Location: [facility] OR;  Service: Orthopedics;  Laterality: Left;  Left sided lumbar 4-5 microdisectomy   SPINE SURGERY  2013    Allergies  Allergen Reactions   Lisinopril Cough   Atorvastatin  Other (See Comments)   Hydrochlorothiazide Other (See Comments)    pancreatitis    Objective/Physical Exam Neurovascular status intact.  Incisions nicely healed.  No edema.  Muscle strength 5/5 all compartments.  No tenderness with palpation throughout the posterior calf or plantar heels  Assessment: 1. s/p EPF + gastroc aponeurosis lengthening B/L. DOS: 10/18/2023   Plan of Care:  -Patient was  evaluated.  - Patient is now ambulating and walking in tennis shoes without pain.  Overall she says she is about 70% better.  She feels significantly better now than she did prior to surgery -Patient may continue stretching and strengthening exercises at home.  She declined physical therapy due to financial concern and time constraints -Slowly increase to full activity no restrictions -Return to clinic as needed  Dot Gazella, DPM [facility]  Dr. Dot Gazella, DPM    [address] [place], [place] [address]                Office [phone]  Fax [phone]

## 2024-01-18 ENCOUNTER — Other Ambulatory Visit: Payer: Self-pay | Admitting: Internal Medicine

## 2024-04-14 ENCOUNTER — Ambulatory Visit: Admitting: Internal Medicine

## 2024-04-14 ENCOUNTER — Other Ambulatory Visit: Payer: Self-pay | Admitting: Internal Medicine

## 2024-04-14 ENCOUNTER — Encounter: Payer: Self-pay | Admitting: Internal Medicine

## 2024-04-14 VITALS — BP 136/82 | HR 72 | Ht 61.0 in | Wt 164.0 lb

## 2024-04-14 DIAGNOSIS — E1142 Type 2 diabetes mellitus with diabetic polyneuropathy: Secondary | ICD-10-CM

## 2024-04-14 DIAGNOSIS — E1165 Type 2 diabetes mellitus with hyperglycemia: Secondary | ICD-10-CM

## 2024-04-14 DIAGNOSIS — E785 Hyperlipidemia, unspecified: Secondary | ICD-10-CM | POA: Diagnosis not present

## 2024-04-14 DIAGNOSIS — Z794 Long term (current) use of insulin: Secondary | ICD-10-CM | POA: Diagnosis not present

## 2024-04-14 DIAGNOSIS — E1129 Type 2 diabetes mellitus with other diabetic kidney complication: Secondary | ICD-10-CM | POA: Diagnosis not present

## 2024-04-14 DIAGNOSIS — R809 Proteinuria, unspecified: Secondary | ICD-10-CM

## 2024-04-14 LAB — POCT GLUCOSE (DEVICE FOR HOME USE): Glucose Fasting, POC: 90 mg/dL (ref 70–99)

## 2024-04-14 LAB — POCT GLYCOSYLATED HEMOGLOBIN (HGB A1C): Hemoglobin A1C: 8.5 % — AB (ref 4.0–5.6)

## 2024-04-14 MED ORDER — GLIMEPIRIDE 4 MG PO TABS: 8.0000 mg | ORAL_TABLET | Freq: Every day | ORAL | 3 refills | Status: DC

## 2024-04-14 MED ORDER — INSULIN PEN NEEDLE 32G X 4 MM MISC: 1.0000 | Freq: Every day | 3 refills | Status: DC

## 2024-04-14 MED ORDER — TRESIBA FLEXTOUCH 200 UNIT/ML ~~LOC~~ SOPN: 20.0000 [IU] | PEN_INJECTOR | Freq: Every day | SUBCUTANEOUS | 3 refills | Status: DC

## 2024-04-14 NOTE — Progress Notes (Signed)
 Name: Teresa Grant  MRN/ DOB: [id number], 04/30/1961   Age/ Sex: 63 y.o., female    PCP: Jarold Medici, MD   Reason for Endocrinology Evaluation: Type 2 Diabetes Mellitus     Date of Initial Endocrinology Visit: 06/15/2021    PATIENT IDENTIFIER: Teresa Grant is a 63 y.o. female with a past medical history of T2DM, HTN and Dyslipidemia . The patient presented for initial endocrinology clinic visit on 06/15/2021 for consultative assistance with her diabetes management.    HPI: Teresa Grant was    Diagnosed with DM 2008 Prior Medications tried/Intolerance: Glipizide  , januvia  Hemoglobin A1c has ranged from 6.8% in 2020, peaking at 11.2% in 2022.   On her initial visit to our clinic she had an A1c of 8.5%, we continued Xigduo , increase Ozempic , and decrease basal insulin    Stopped Ozempic  07/2022 due to hx of pancreatitis , presenting to ED 06/2022 with N/V and slight elevation in lipase  Started glimepiride  01/2023 due to increased A1c from 6.4% to 11.4%   She met with Rock Dasen 08/2023  SUBJECTIVE:   During the last visit (12/05/2023): A1c was 7.5 %   Today (04/14/24): Teresa Grant is here for follow-up on diabetes management.  She has not been using the Dexcom recently, she just applied a new sensor this morning.She does have hypoglycemia at night, she is sympomatic  Continues to follow-up with podiatry for history of endoscopic plantar fasciotomy She was evaluated by GI for abdominal pain and bloating, was started on probiotics    HOME DIABETES REGIMEN: Glimepiride  4 mg daily Xigduo  01-999 mg twice daily  Tresiba  22 units daily  Rosuvastatin  40 mg daily Telmisartan  40 mg daily    Statin: yes ACE-I/ARB: Intolerant to Lisinopril due to cough    CONTINUOUS GLUCOSE MONITORING RECORD INTERPRETATION    Dates of Recording: 7/8-7/21/2025  Sensor description:dexcom  Results statistics:   CGM use % of time 69  Average and SD 205/87  Time in  range 49 %  % Time Above 180 23  % Time above 250 28  % Time Below target 0   Glycemic patterns summary: BG's trend down overnight and increased throughout the day  Hyperglycemic episodes postprandial  Hypoglycemic episodes occurred rare at night  Overnight periods: optimal    DIABETIC COMPLICATIONS: Microvascular complications:   Denies: CKD, retinopathy, neuropathy  Last eye exam: Completed 01/24/2023  Macrovascular complications:   Denies: CAD, PVD, CVA   PAST HISTORY: Past Medical History:  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   Diabetes mellitus    Dysrhythmia    1985   Heart murmur    HLD (hyperlipidemia)    Hypertension    Kidney stone on right side 03/2013   Pneumonia    7 yrs ago   Past Surgical History:  Past Surgical History:  Procedure Laterality Date   ABDOMINAL HYSTERECTOMY  2001   BACK SURGERY     CERVICAL SPINE SURGERY  06/06/2019   Dr. Gillie, performed at surgical center   CESAREAN SECTION  1990   COLONOSCOPY     Dr. Debrah normal   FOOT SURGERY Bilateral    LEFT HEART CATH AND CORONARY ANGIOGRAPHY N/A 11/03/2021   Procedure: LEFT HEART CATH AND CORONARY ANGIOGRAPHY;  Surgeon: Mady Bruckner, MD;  Location: [facility];  Service: Cardiovascular;  Laterality: N/A;   LUMBAR LAMINECTOMY/DECOMPRESSION MICRODISCECTOMY  03/20/2012   Procedure: LUMBAR LAMINECTOMY/DECOMPRESSION MICRODISCECTOMY;  Surgeon: Oneil Rodgers Priestly, MD;  Location: [facility] OR;  Service: Orthopedics;  Laterality: Left;  Left sided lumbar 4-5 microdisectomy   SPINE SURGERY  2013    Social History:  reports that she quit smoking about 19 years ago. Her smoking use included cigarettes. She started smoking about 44 years ago. She has a 12.5 pack-year smoking history. She has never used smokeless tobacco. She reports that she does not currently use alcohol. She reports that she does not use drugs. Family History:  Family History  Problem  Relation Age of Onset   Hypertension Mother    Stroke Mother    Heart attack Mother 18   Heart disease Mother    Hypertension Father    Kidney disease Father    Hypertension Sister    Colon polyps Sister    Hypertension Brother    Hypertension Maternal Aunt    Hypertension Maternal Uncle    Esophageal cancer Maternal Uncle    Diabetes Paternal Aunt    Diabetes Paternal Uncle    Colon cancer Cousin    Cancer Neg Hx    Rectal cancer Neg Hx    Stomach cancer Neg Hx    Breast cancer Neg Hx      HOME MEDICATIONS: Allergies as of 04/14/2024       Reactions   Lisinopril Cough   Atorvastatin  Other (See Comments)   Hydrochlorothiazide Other (See Comments)   pancreatitis        Medication List        Accurate as of April 14, 2024  8:30 AM. If you have any questions, ask your nurse or doctor.          amLODipine  10 MG tablet Commonly known as: NORVASC  Take 1 tablet (10 mg total) by mouth daily.   aspirin  EC 81 MG tablet Take 4 tablets (325 mg total) by mouth daily. Swallow whole.   Citrucel oral powder Generic drug: methylcellulose Use as directed, daily   Dapagliflozin  Pro-metFORMIN  ER 01-999 MG Tb24 Commonly known as: Xigduo  XR Take 2 tablets by mouth daily before supper.   Dexcom G7 Sensor Misc Change sensors  every 10 days   glimepiride  4 MG tablet Commonly known as: AMARYL  Take 1 tablet (4 mg total) by mouth daily before supper.   IBgard 90 MG Cpcr Generic drug: Peppermint Oil Take as directed   ibuprofen  800 MG tablet Commonly known as: ADVIL  Take 1 tablet (800 mg total) by mouth 3 (three) times daily.   Insulin  Pen Needle 32G X 4 MM Misc 1 Device by Does not apply route daily in the afternoon.   nitroGLYCERIN  0.4 MG SL tablet Commonly known as: NITROSTAT  Place 1 tablet (0.4 mg total) under the tongue every 5 (five) minutes as needed for chest pain. UP TO 3 TABLETS   OneTouch Verio test strip Generic drug: glucose blood Use as directed to  check blood sugars 2 times per day dx: e11.65   rosuvastatin  40 MG tablet Commonly known as: CRESTOR  Take 1 tablet (40 mg total) by mouth daily.   telmisartan  40 MG tablet Commonly known as: Micardis  Take 1 tablet (40 mg total) by mouth daily.   Tresiba  FlexTouch 200 UNIT/ML FlexTouch Pen Generic drug: insulin  degludec Inject 22 Units into the skin at bedtime.   VITAMIN C  PO Take 500 mg by mouth every evening.   Vitamin D  (Ergocalciferol ) 1.25 MG (50000 UNIT) Caps capsule Commonly known as: DRISDOL  TAKE 1 CAPSULE BY MOUTH ONCE WEEKLY   ZINCATE PO Take 50 mg by mouth every evening.  ALLERGIES: Allergies  Allergen Reactions   Lisinopril Cough   Atorvastatin  Other (See Comments)   Hydrochlorothiazide Other (See Comments)    pancreatitis     REVIEW OF SYSTEMS: A comprehensive ROS was conducted with the patient and is negative except as per HPI     OBJECTIVE:   VITAL SIGNS:BP 136/82 (BP Location: Right Arm, Patient Position: Sitting, Cuff Size: Normal)   Pulse 72   Ht 5' 1 (1.549 m)   Wt 164 lb (74.4 kg)   SpO2 98%   BMI 30.99 kg/m    PHYSICAL EXAM:  General: Pt appears well and is in NAD  Lungs: Clear with good BS bilat   Heart: RRR   Extremities:  Lower extremities - No pretibial edema  Neuro: MS is good with appropriate affect, pt is alert and Ox3    DM foot exam: 11/14/2023 per podiatry     DATA REVIEWED:  Lab Results  Component Value Date   HGBA1C 8.5 (A) 04/14/2024   HGBA1C 7.5 (A) 12/05/2023   HGBA1C 8.0 (H) 09/17/2023    Latest Reference Range & Units 12/05/23 10:01  Sodium 135 - 146 mmol/L 140  Potassium 3.5 - 5.3 mmol/L 3.9  Chloride 98 - 110 mmol/L 105  CO2 20 - 32 mmol/L 24  Glucose 65 - 99 mg/dL 892 (H)  BUN 7 - 25 mg/dL 19  Creatinine 9.49 - 8.94 mg/dL 8.82 (H)  Calcium  8.6 - 10.4 mg/dL 9.9  BUN/Creatinine Ratio 6 - 22 (calc) 16  AG Ratio 1.0 - 2.5 (calc) 1.4  AST 10 - 35 U/L 15  ALT 6 - 29 U/L 12  Total Protein  6.1 - 8.1 g/dL 7.8  Total Bilirubin 0.2 - 1.2 mg/dL 0.6    Latest Reference Range & Units 12/05/23 10:01  Total CHOL/HDL Ratio <5.0 (calc) 4.0  Cholesterol <200 mg/dL 811  HDL Cholesterol > OR = 50 mg/dL 47 (L)  LDL Cholesterol (Calc) mg/dL (calc) 878 (H)  MICROALB/CREAT RATIO <30 mg/g creat 604 (H)  Non-HDL Cholesterol (Calc) <130 mg/dL (calc) 858 (H)  Triglycerides <150 mg/dL 92  (L): Data is abnormally low (H): Data is abnormally high   ASSESSMENT / PLAN / RECOMMENDATIONS:   1) Type 2 Diabetes Mellitus, poorly controlled, With Neuropathic and complications and microabuminuria - Most recent A1c of 8.5%. Goal A1c < 7.0 %.    -A1c has increased from 7.5% to 8.5% -She was on glipizide  in the past, which was discontinued due to hypoglycemia -I stopped Ozempic  due to history of pancreatitis and chronic abdominal pain -Unfortunately, she continues to take glimepiride  and Xigduo  at bedtime rather than before dinner as previously advised  -I will reduce insulin  as below - I will increase glimepiride , we discussed that if postprandial hyperglycemia continues, we will start her on prandial insulin    MEDICATIONS:  Take glimepiride  4 mg daily, 2  before supper Continue Xigduo  01-999 mg 2 tabs before. Decrease Tresiba  to 20 units daily   EDUCATION / INSTRUCTIONS: BG monitoring instructions: Patient is instructed to check her blood sugars 1 times a day, fasting . Call [facility] if: BG persistently < 70  I reviewed the Rule of 15 for the treatment of hypoglycemia in detail with the patient. Literature supplied.   2) Diabetic complications:  Eye: Does not have known diabetic retinopathy.  Neuro/ Feet: Does  have known diabetic peripheral neuropathy based on exam 9/21/202 Renal: Patient does not have known baseline CKD. She is  on an ACEI/ARB at present.  3) Dyslipidemia:    - LDL was above goal at 100 mg/dL in 7978, We switched pravastatin  to  Atorvastatin  , by  March, 2025 switch from atorvastatin  to rosuvastatin  due to LDL continuing to be above goal   Medication  Rosuvastatin  40 mg daily   4) Microalbuminuria:  -Emphasized the importance of optimizing glucose control and compliance with ARB intake -I increase telmisartan  from 20 mg to 40 mg by March, 2025 due to worsening MA/CR ratio   Medication telmisartan  40 mg    F/U in 3 months    Signed electronically by: Stefano Redgie Butts, MD  [facility]  [facility] [address] [place], [place] [address] Phone: [phone] FAX: [phone]   CC: Jarold Medici, MD [address] [place] [address] Phone: [phone]  Fax: [phone]    Return to Endocrinology clinic as below: Future Appointments  Date Time Provider Department Center  06/25/2024 11:00 AM Jarold Medici, MD [facility] None  12/25/2024 11:20 AM Jarold Medici, MD [facility] None

## 2024-04-14 NOTE — Patient Instructions (Addendum)
 Take glimepiride  4 mg, 2 tablets before Supper Take Xigduo  01-999 mg, 2 tablets before supper Take Tresiba  20 units daily   Take rosuvastatin  40 mg at night-for cholesterol Take telmisartan  40 mg, 1 tablet daily-to protect your kidneys   HOW TO TREAT LOW BLOOD SUGARS (Blood sugar LESS THAN 70 MG/DL) Please follow the RULE OF 15 for the treatment of hypoglycemia treatment (when your (blood sugars are less than 70 mg/dL)   STEP 1: Take 15 grams of carbohydrates when your blood sugar is low, which includes:  3-4 GLUCOSE TABS  OR 3-4 OZ OF JUICE OR REGULAR SODA OR ONE TUBE OF GLUCOSE GEL    STEP 2: RECHECK blood sugar in 15 MINUTES STEP 3: If your blood sugar is still low at the 15 minute recheck --> then, go back to STEP 1 and treat AGAIN with another 15 grams of carbohydrates.

## 2024-04-23 ENCOUNTER — Other Ambulatory Visit: Payer: Self-pay | Admitting: Internal Medicine

## 2024-06-25 ENCOUNTER — Ambulatory Visit: Admitting: Internal Medicine

## 2024-06-25 ENCOUNTER — Encounter: Payer: Self-pay | Admitting: Internal Medicine

## 2024-06-25 VITALS — BP 132/80 | HR 80 | Temp 98.1°F | Ht 61.0 in | Wt 165.2 lb

## 2024-06-25 DIAGNOSIS — Z6831 Body mass index (BMI) 31.0-31.9, adult: Secondary | ICD-10-CM

## 2024-06-25 DIAGNOSIS — I25118 Atherosclerotic heart disease of native coronary artery with other forms of angina pectoris: Secondary | ICD-10-CM | POA: Diagnosis not present

## 2024-06-25 DIAGNOSIS — I1310 Hypertensive heart and chronic kidney disease without heart failure, with stage 1 through stage 4 chronic kidney disease, or unspecified chronic kidney disease: Secondary | ICD-10-CM

## 2024-06-25 DIAGNOSIS — I700 Atherosclerosis of aorta: Secondary | ICD-10-CM | POA: Diagnosis not present

## 2024-06-25 DIAGNOSIS — Z794 Long term (current) use of insulin: Secondary | ICD-10-CM

## 2024-06-25 DIAGNOSIS — G44209 Tension-type headache, unspecified, not intractable: Secondary | ICD-10-CM

## 2024-06-25 DIAGNOSIS — E1142 Type 2 diabetes mellitus with diabetic polyneuropathy: Secondary | ICD-10-CM | POA: Diagnosis not present

## 2024-06-25 DIAGNOSIS — E6609 Other obesity due to excess calories: Secondary | ICD-10-CM

## 2024-06-25 DIAGNOSIS — F4321 Adjustment disorder with depressed mood: Secondary | ICD-10-CM

## 2024-06-25 DIAGNOSIS — E66811 Obesity, class 1: Secondary | ICD-10-CM

## 2024-06-25 MED ORDER — AMLODIPINE BESYLATE 10 MG PO TABS: 10.0000 mg | ORAL_TABLET | Freq: Every day | ORAL | 2 refills | Status: AC

## 2024-06-25 MED ORDER — CYCLOBENZAPRINE HCL 5 MG PO TABS: ORAL_TABLET | ORAL | 0 refills | Status: AC

## 2024-06-25 MED ORDER — ASPIRIN 81 MG PO TBEC: 325.0000 mg | DELAYED_RELEASE_TABLET | Freq: Every day | ORAL | 2 refills | Status: DC

## 2024-06-25 NOTE — Progress Notes (Signed)
 I,Victoria T Emmitt, CMA,acting as a Neurosurgeon for Catheryn LOISE Slocumb, MD.,have documented all relevant documentation on the behalf of Catheryn LOISE Slocumb, MD,as directed by  Catheryn LOISE Slocumb, MD while in the presence of Catheryn LOISE Slocumb, MD.  Subjective:  Patient ID: Teresa Grant , female    DOB: 08/14/1961 , 63 y.o.   MRN: [id number]  Chief Complaint  Patient presents with   Diabetes    Patient presents today for blood pressure & diabetes check. She reports compliance with medications.  She states experiencing headache in her sinuses. She also feels tired. This has been an issue for 2 weeks. Denies fever & chills.    Hypertension    HPI Discussed the use of AI scribe software for clinical note transcription with the patient, who gave verbal consent to proceed.  History of Present Illness Teresa Grant is a 63 year old female with diabetes and hypertension who presents for a diabetes check.  She has been experiencing persistent headaches since Friday, starting at the occipital region and moving anteriorly, with periorbital soreness. The headaches occur upon waking and sleeping. Her daughter and husband have noticed her symptoms, and her granddaughter commented on her looking tired with dark eyes.  She has a history of diabetes with her last A1c recorded at 8.5%. She ran out of her glimepiride  medication due to insurance issues, leading to a two-week lapse in medication. She resumed taking it yesterday and noticed her blood sugar levels dropping. She continues to take Tresiba  at 20 units daily. She has difficulty managing her medication refills and feels frustrated with the process.  Her recent blood pressure readings were 132/80 mmHg and 136/82 mmHg. She is currently taking amlodipine  10 mg, rosuvastatin  40 mg, and telmisartan  40 mg.  She has a history of surgeries on her neck and legs, which are part of the reasons for her disability application. She underwent a physical examination two  weeks ago as part of this process.  Socially, she reports significant changes in her life, including her grandkids moving away after their parents' divorce, which has impacted her emotionally. She feels depressed and has considered therapy. 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   Diabetes She presents for her follow-up diabetic visit. She has type 2 diabetes mellitus. Her disease course has been stable. There are no hypoglycemic associated symptoms. Pertinent negatives for hypoglycemia include no headaches. Pertinent negatives for diabetes include no blurred vision, no polydipsia, no polyphagia and no polyuria. There are no hypoglycemic complications. Risk factors for coronary artery disease include diabetes mellitus, dyslipidemia, hypertension, obesity, post-menopausal, sedentary lifestyle and stress. She is following a diabetic diet. She participates in exercise intermittently. Her breakfast blood glucose is taken between 8-9 am. Her breakfast blood glucose range is generally 130-140 mg/dl. An ACE inhibitor/angiotensin II receptor blocker is being taken. Eye exam is current.  Hypertension This is a chronic problem. The current episode started more than 1 year ago. The problem has been gradually improving since onset. The problem is controlled. Pertinent negatives include no blurred vision, headaches or palpitations. The current treatment provides moderate improvement. Compliance problems include exercise.      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   Diabetes mellitus    Dysrhythmia    1985   Heart murmur    HLD (hyperlipidemia)  Hypertension    Kidney stone on right side 03/2013   Pneumonia    7 yrs ago     Family History  Problem Relation Age of Onset   Hypertension Mother    Stroke Mother    Heart attack  Mother 55   Heart disease Mother    Hypertension Father    Kidney disease Father    Hypertension Sister    Colon polyps Sister    Hypertension Brother    Hypertension Maternal Aunt    Hypertension Maternal Uncle    Esophageal cancer Maternal Uncle    Diabetes Paternal Aunt    Diabetes Paternal Uncle    Colon cancer Cousin    Cancer Neg Hx    Rectal cancer Neg Hx    Stomach cancer Neg Hx    Breast cancer Neg Hx      Current Outpatient Medications:    cyclobenzaprine  (FLEXERIL ) 5 MG tablet, One tab po at bedtime prn, Disp: 30 tablet, Rfl: 0   amLODipine  (NORVASC ) 10 MG tablet, Take 1 tablet (10 mg total) by mouth daily., Disp: 90 tablet, Rfl: 2   Ascorbic Acid  (VITAMIN C  PO), Take 500 mg by mouth every evening., Disp: , Rfl:    aspirin  EC 81 MG tablet, Take 4 tablets (325 mg total) by mouth daily. Swallow whole., Disp: 100 tablet, Rfl: 2   Continuous Glucose Sensor (DEXCOM G7 SENSOR) MISC, APPLY SENSOR TO SKIN FOR CONTINUOUS BLOOD SUGAR MONITORING, REPLACE EVERY 10 DAYS, Disp: 9 each, Rfl: 1   Dapagliflozin  Pro-metFORMIN  ER (XIGDUO  XR) 01-999 MG TB24, Take 2 tablets by mouth daily before supper., Disp: 180 tablet, Rfl: 3   glimepiride  (AMARYL ) 4 MG tablet, Take 2 tablets (8 mg total) by mouth daily before supper., Disp: 180 tablet, Rfl: 3   glucose blood (ONETOUCH VERIO) test strip, Use as directed to check blood sugars 2 times per day dx: e11.65, Disp: 100 each, Rfl: 10   ibuprofen  (ADVIL ) 800 MG tablet, Take 1 tablet (800 mg total) by mouth 3 (three) times daily., Disp: 60 tablet, Rfl: 1   insulin  degludec (TRESIBA  FLEXTOUCH) 200 UNIT/ML FlexTouch Pen, Inject 20 Units into the skin at bedtime., Disp: 30 mL, Rfl: 3   Insulin  Pen Needle 32G X 4 MM MISC, 1 Device by Does not apply route daily in the afternoon., Disp: 100 each, Rfl: 3   methylcellulose (CITRUCEL) oral powder, Use as directed, daily, Disp: , Rfl:    nitroGLYCERIN  (NITROSTAT ) 0.4 MG SL tablet, Place 1 tablet (0.4 mg total)  under the tongue every 5 (five) minutes as needed for chest pain. UP TO 3 TABLETS, Disp: 25 tablet, Rfl: PRN   Peppermint Oil (IBGARD) 90 MG CPCR, Take as directed, Disp: 12 capsule, Rfl: 0   rosuvastatin  (CRESTOR ) 40 MG tablet, Take 1 tablet (40 mg total) by mouth daily., Disp: 90 tablet, Rfl: 3   telmisartan  (MICARDIS ) 40 MG tablet, Take 1 tablet (40 mg total) by mouth daily., Disp: 90 tablet, Rfl: 3   Vitamin D , Ergocalciferol , (DRISDOL ) 1.25 MG (50000 UNIT) CAPS capsule, TAKE 1 CAPSULE BY MOUTH ONCE WEEKLY, Disp: 12 capsule, Rfl: 1   Zinc  Sulfate (ZINCATE PO), Take 50 mg by mouth every evening., Disp: , Rfl:    Allergies  Allergen Reactions   Lisinopril Cough   Atorvastatin  Other (See Comments)   Hydrochlorothiazide Other (See Comments)    pancreatitis     Review of Systems  Constitutional: Negative.   Eyes:  Negative for blurred vision.  Respiratory:  Negative.    Cardiovascular: Negative.  Negative for palpitations.  Gastrointestinal: Negative.   Endocrine: Negative for polydipsia, polyphagia and polyuria.  Neurological: Negative.  Negative for headaches.  Psychiatric/Behavioral: Negative.       Today's Vitals   06/25/24 1045  BP: 132/80  Pulse: 80  Temp: 98.1 F (36.7 C)  SpO2: 98%  Weight: 165 lb 3.2 oz (74.9 kg)  Height: 5' 1 (1.549 m)   Body mass index is 31.21 kg/m.  Wt Readings from Last 3 Encounters:  06/25/24 165 lb 3.2 oz (74.9 kg)  04/14/24 164 lb (74.4 kg)  01/14/24 166 lb (75.3 kg)    BP Readings from Last 3 Encounters:  06/25/24 132/80  04/14/24 136/82  12/25/23 120/70    Objective:  Physical Exam Vitals and nursing note reviewed.  Constitutional:      Appearance: Normal appearance.  HENT:     Head: Normocephalic and atraumatic.  Eyes:     Extraocular Movements: Extraocular movements intact.  Cardiovascular:     Rate and Rhythm: Normal rate and regular rhythm.     Heart sounds: Normal heart sounds.  Pulmonary:     Effort: Pulmonary  effort is normal.     Breath sounds: Normal breath sounds.  Musculoskeletal:     Cervical back: Normal range of motion. Tenderness present.  Skin:    General: Skin is warm.  Neurological:     General: No focal deficit present.     Mental Status: She is alert.  Psychiatric:        Mood and Affect: Mood normal.        Behavior: Behavior normal.         Assessment And Plan:  Type 2 diabetes mellitus with diabetic polyneuropathy, with long-term current use of insulin  [facility]) Assessment & Plan: Diabetes management is suboptimal with A1c at 8.5% due to medication adherence issues. Medication regimen resumed. - Continue glimepiride  8 mg daily and Tresiba  20 units daily. - Schedule follow-up lab visit in December to reassess A1c levels. - Encouraged communication via MyChart for medication issues. - Endo input is appreciated - Resume eating healthier food options  Orders: -     CMP14+EGFR -     Lipid panel -     Hemoglobin A1c  Hypertensive heart and renal disease with renal failure, stage 1 through stage 4 or unspecified chronic kidney disease, without heart failure Assessment & Plan: Chronic, blood pressure slightly elevated at 132/80 mmHg. On amlodipine  and telmisartan . - Refill amlodipine  10 mg and telmisartan  40 mg prescriptions.   Coronary artery disease of native artery of native heart with stable angina pectoris Assessment & Plan: Chronic, LDL goal is less than 70. Importance of ASA and statin compliance was discussed with the patient.  - Resume ASA 81mg  daily.    Aortic atherosclerosis Assessment & Plan: Chronic, she is now on rosuvastatin  40mg  daily as per Endo. LDL goal is less than 70. She is encouraged to c/w statin therapy and follow a heart healthy lifestyle. At risk for cardiovascular events. Not on regular aspirin  therapy. - Initiate daily coated aspirin  therapy for cardiovascular protection.       Tension headache Assessment & Plan: Headaches possibly  related to muscle tension, persistent throughout the day. - Prescribe muscle relaxer at night as needed. - Advise use of Vicks VapoRub for symptomatic relief.   Adjustment disorder with depressed mood Assessment & Plan: Reports depression, likely related to life changes. Interested in therapy. - Refer to in-house therapist for counseling  sessions, with virtual visit option.  Orders: -     Amb ref to Integrated [facility]  Class 1 obesity due to excess calories with serious comorbidity and body mass index (BMI) of 31.0 to 31.9 in adult Assessment & Plan: She is encouraged to strive for BMI less than 30 to decrease cardiac risk. Advised to aim for at least 150 minutes of exercise per week.    Other orders -     Cyclobenzaprine  HCl; One tab po at bedtime prn  Dispense: 30 tablet; Refill: 0 -     amLODIPine  Besylate; Take 1 tablet (10 mg total) by mouth daily.  Dispense: 90 tablet; Refill: 2 -     Aspirin ; Take 4 tablets (325 mg total) by mouth daily. Swallow whole.  Dispense: 100 tablet; Refill: 2  Collaboration of Care: Referral or follow-up with counselor/therapist AEB   Patient/Guardian was advised Release of Information must be obtained prior to any record release in order to collaborate their care with an outside provider. Patient/Guardian was advised if they have not already done so to contact the registration department to sign all necessary forms in order for us  to release information regarding their care. She is in agreement with treatment plan.    Return in 8 weeks (on 08/20/2024), or lab visit, check a1c.  Patient was given opportunity to ask questions. Patient verbalized understanding of the plan and was able to repeat key elements of the plan. All questions were answered to their satisfaction.   I, Catheryn LOISE Slocumb, MD, have reviewed all documentation for this visit. The documentation on 06/25/24 for the exam, diagnosis, procedures, and orders are all accurate and  complete.   IF YOU HAVE BEEN REFERRED TO A SPECIALIST, IT MAY TAKE 1-2 WEEKS TO SCHEDULE/PROCESS THE REFERRAL. IF YOU HAVE NOT HEARD FROM US /SPECIALIST IN TWO WEEKS, PLEASE GIVE US  A CALL AT [phone] X [phone].   THE PATIENT IS ENCOURAGED TO PRACTICE SOCIAL DISTANCING DUE TO THE COVID-19 PANDEMIC.

## 2024-06-25 NOTE — Patient Instructions (Signed)

## 2024-06-26 LAB — HEMOGLOBIN A1C
Est. average glucose Bld gHb Est-mCnc: 194 mg/dL
Hgb A1c MFr Bld: 8.4 % — ABNORMAL HIGH (ref 4.8–5.6)

## 2024-06-26 LAB — CMP14+EGFR
ALT: 16 IU/L (ref 0–32)
AST: 19 IU/L (ref 0–40)
Albumin: 4.7 g/dL (ref 3.9–4.9)
Alkaline Phosphatase: 148 IU/L — ABNORMAL HIGH (ref 49–135)
BUN/Creatinine Ratio: 12 (ref 12–28)
BUN: 19 mg/dL (ref 8–27)
Bilirubin Total: 0.4 mg/dL (ref 0.0–1.2)
CO2: 19 mmol/L — ABNORMAL LOW (ref 20–29)
Calcium: 10.2 mg/dL (ref 8.7–10.3)
Chloride: 105 mmol/L (ref 96–106)
Creatinine, Ser: 1.62 mg/dL — ABNORMAL HIGH (ref 0.57–1.00)
Globulin, Total: 2.8 g/dL (ref 1.5–4.5)
Glucose: 57 mg/dL — ABNORMAL LOW (ref 70–99)
Potassium: 4 mmol/L (ref 3.5–5.2)
Sodium: 142 mmol/L (ref 134–144)
Total Protein: 7.5 g/dL (ref 6.0–8.5)
eGFR: 35 mL/min/1.73 — ABNORMAL LOW (ref >59)

## 2024-06-26 LAB — LIPID PANEL
Chol/HDL Ratio: 2.5 ratio (ref 0.0–4.4)
Cholesterol, Total: 99 mg/dL — ABNORMAL LOW (ref 100–199)
HDL: 39 mg/dL — ABNORMAL LOW (ref >39)
LDL Chol Calc (NIH): 45 mg/dL (ref 0–99)
Triglycerides: 71 mg/dL (ref 0–149)
VLDL Cholesterol Cal: 15 mg/dL (ref 5–40)

## 2024-06-28 ENCOUNTER — Ambulatory Visit: Payer: Self-pay | Admitting: Internal Medicine

## 2024-06-29 DIAGNOSIS — F4321 Adjustment disorder with depressed mood: Secondary | ICD-10-CM | POA: Insufficient documentation

## 2024-06-29 NOTE — Assessment & Plan Note (Signed)
 She is encouraged to strive for BMI less than 30 to decrease cardiac risk. Advised to aim for at least 150 minutes of exercise per week.

## 2024-06-29 NOTE — Assessment & Plan Note (Addendum)
 Chronic, she is now on rosuvastatin  40mg  daily as per Endo. LDL goal is less than 70. She is encouraged to c/w statin therapy and follow a heart healthy lifestyle. At risk for cardiovascular events. Not on regular aspirin  therapy. - Initiate daily coated aspirin  therapy for cardiovascular protection.

## 2024-06-29 NOTE — Assessment & Plan Note (Signed)
 Headaches possibly related to muscle tension, persistent throughout the day. - Prescribe muscle relaxer at night as needed. - Advise use of Vicks VapoRub for symptomatic relief.

## 2024-06-29 NOTE — Assessment & Plan Note (Signed)
 Chronic, blood pressure slightly elevated at 132/80 mmHg. On amlodipine  and telmisartan . - Refill amlodipine  10 mg and telmisartan  40 mg prescriptions.

## 2024-06-29 NOTE — Assessment & Plan Note (Signed)
 Diabetes management is suboptimal with A1c at 8.5% due to medication adherence issues. Medication regimen resumed. - Continue glimepiride  8 mg daily and Tresiba  20 units daily. - Schedule follow-up lab visit in December to reassess A1c levels. - Encouraged communication via MyChart for medication issues. - Endo input is appreciated - Resume eating healthier food options

## 2024-06-29 NOTE — Assessment & Plan Note (Signed)
 Reports depression, likely related to life changes. Interested in therapy. - Refer to in-house therapist for counseling sessions, with virtual visit option.

## 2024-06-29 NOTE — Assessment & Plan Note (Signed)
 Chronic, LDL goal is less than 70. Importance of ASA and statin compliance was discussed with the patient.  - Resume ASA 81mg  daily.

## 2024-07-03 ENCOUNTER — Ambulatory Visit (INDEPENDENT_AMBULATORY_CARE_PROVIDER_SITE_OTHER): Admitting: Licensed Clinical Social Worker

## 2024-07-03 DIAGNOSIS — F4321 Adjustment disorder with depressed mood: Secondary | ICD-10-CM

## 2024-07-03 NOTE — Patient Instructions (Signed)
 Using Behavioral Activation to manage stress/depression symptoms    Identify/understand your own mood triggers.   Structure your day--get up around the same time, eat meals/snacks around the same time, go to bed around the same time.   Purposefully schedule self care time and time to complete tasks. This can include quiet time  Stimulate your brain--go for a walk, text/call a friend or family member, if you are indoors--go outside (and vice versa), go for a drive, go to a store with bright colors and bright lights. Try to do things in a different way--drive to your favorite places using an alternative route, or instead of starting on the right side of the grocery store when shopping, start on the left side. You might feel a bit uncomfortable doing things outside of the comfort zone, but this is helping the brain create new neural pathways and is very healthy for brain/emotional health.   Physical movement based on your ability. If you can go for a walk, do stretches, even waving your hands to music can trigger feel-good endorphins in the brain and help release physical tension we all hold in our bodies.  Even 5 minutes can make a difference.   Be intentional about doing things that bring you joy (or used to bring you joy), and look for the things in every day that make you happy.  Seek those glimmers of joy each day.  Set a timer for 5 minutes for a harder task (ex. Laundry, washing dishes).  Allow yourself to work distraction-free for 5 minutes, then stop when the timer goes off. If you need a break, take a break. If you want to continue working then set another timer for whatever time you choose.   Limit or eliminate substance use including alcohol, marijuana, or recreational use of prescription medication.  Let in the light!! Open the window blinds, curtains and let natural light in. Even sitting near a window or sitting outside can boost your mood, especially in the wintertime when there  is less daylight.    Things to envision for ourselves to to improve inspiration, motivation, and initiative :  improving physical wellness, focus on family relationships, focusing on our own mental/emotional well being, being a part of a bigger community, finding a hobby, being a part of something that fosters personal growth, engaging socially with others (even digitally!!!)   _____________________________________________________________________________________________________  ANXIETY/PANIC EPISODE MANAGEMENT (CBT/MINDFULNESS BASED)   If you are in a highly stimulating or triggering environment, change your location to a less stimulating or safer environment .   Stimulate your senses by tasting/eating a sour candy (such as a lemon drop) or a strong flavored cough drop.  This triggers smell, taste, and touch since we  have had lots of nerve endings inside of your mouth.   Practice slow, controlled breathing to avoid hyperventilation.  Breathing into your nose for the count of 4 inside your head, holding your breath for the count of 4 inside your head, and exhale slowly counting to 8 inside your head.  This is called 4-4-8 breathing, or triangle breathing. (Controlled breathing). This helps manage panic, anxiety, anger, and tearfulness.  Additional grounding exercises include rubbing her hands softly together, and wiggling your toes inside your shoes, pretending to grasp the floor with your toes.    Listen to calming music or sounds  Count backwards in your head by twos or tens or recite multiplication tables in your head.  Visualize a calming, happy place and identify 5 explicit details  about this place (color, temperature, smells, visual details, etc.)  Splash cold water on your face or hold an ice cube in your hand (alternate hands)  Clench and release muscle groups (hands, shoulders, facial muscles)  Engage in soothing activities to recover after a stressful/anxious episode such  as: drinking a warm beverage, sitting in your favorite comfortable location, positive physical contact with a pet or weighted blanket, using positive self talk/positive affirmations.   Download PTSD Coach app for your phone or tablet--its free and has lots of tips to help you manage panic episodes no matter where you are!   ________________________________________________________________________________________________________ Sleep Hygiene Tips  Bedroom Environment - Keep your bedroom cool, quiet, and dark - Use blackout curtains or a sleep mask - Limit noise with earplugs or a white noise machine - Invest in a comfortable mattress and pillows - Remove electronic devices or dim brightness  Sleep Schedule - Go to bed and wake up at the same time every day, even on weekends - Avoid long naps during the day (limit to 2030 minutes if needed) - Establish a relaxing pre-sleep routine (e.g., reading, gentle stretching)  Diet & Substances - Avoid caffeine, nicotine, and alcohol close to bedtime - Don't go to bed hungry or overly full - Limit fluid intake in the evening to reduce nighttime bathroom trips  Technology & Stimulation - Avoid screens (phones, tablets, TVs) at least 30-60 minutes before bed - Use blue light filters if screen use is unavoidable - Don't work or watch intense/triggering content in bed  Mental & Physical Health - Practice relaxation techniques like deep breathing, meditation, or progressive muscle relaxation - Exercise regularly, but not too close to bedtime - Manage stress and anxiety through journaling, therapy, or mindfulness  When You Cant Sleep - Get out of bed if you can't sleep after 20 minutes - Do something calming in dim light, then return to bed when sleepy - Avoid clock-watching, which can increase anxiety       Emergency Resources:  National Suicide & Crisis Lifeline: Call or text 988  Crisis Text Line: Text HOME to 236-370-2953  [facility]  [address], [place], [place] [address] [phone] or [phone] WALK-IN URGENT CARE 24/7 FOR ANYONE [address] [place], [place]  [phone] Fax: [phone] [email or web address] *Interpreters available *Accepts all insurance and uninsured for Urgent Care needs *Accepts Medicaid and uninsured for outpatient treatment (below)

## 2024-07-03 NOTE — BH Specialist Note (Signed)
 Collaborative Care Initial Assessment   Pt name: Teresa Grant MRN# [id number]   Date: 07/03/24   Session Start time 1400 Session End time: 1500 Total time in minutes: 60   Type of Contact:  in person   Patient consent obtained:  Yes  Patient and/or legal guardian verbally consented to [facility] services about presenting concerns and psychiatric consultation as appropriate.  The services will be billed as appropriate for the patient   Types of Service: Comprehensive Clinical Assessment (CCA) and Collaborative care  Summary  Teresa Grant is a 63 y.o. female with history of depression seen in consultation at the request of Catheryn Slocumb, MD for establishment of [facility] management.   Pt is currently taking the following psychiatric medications: none .  Current symptoms include: insomnia, feeling sad/down/depressed, hopelessness, worthlessness, little interest or pleasure in doing things, feeling low energy, feeling bad about self, decreased appetite, anxiety, worrying, trouble relaxing.  Pt denies SI, HI, or AVH at time of session. Pt denies substance use.   Reason for referral in patient/family's own words:  Just needing some extra help  Patient's goal for today's visit: Establish I[facility] Collaborative Care  History of Present illness:    History of present illness:  [name] reports that they have a history of depression  off and on throughout her adult life and often feels left out among her siblings and family members and have had the following treatments: pt reports that she had one counseling session in the past and found it helpful but did not go back.  Pt reports concerns about medical history including hypertension, angina, coronary artery disease, GI issues, diabetes, chronic kidney disease.  Pt reports that current external stressors include family conflict, marital conflict.  Pt feels that symptoms of stress,  depression, and anxiety are impacting everyday functioning including mood management, sleep quality, social interaction, and ability to complete tasks within home.Pt oldest of two sisters--feels her sisters do things together and do not include her (including sitting together at church each week together but NOT with pt). Sisters call pt crazy when she tries to communicate her feelings to them.  ALIZAYA OSHEA reports that her friends and children are primary supports at time of assessment.   Pt feels IBH supports including psychiatric medication management and counseling would be something to assist in their overall symptom management.   Clinical Assessments (PHQ-9 and GAD-7)  PHQ-9 Assessments:     07/03/2024    3:09 PM 06/25/2024   10:50 AM 12/25/2023    2:07 PM  Depression screen PHQ 2/9  Decreased Interest 3 0 0  Down, Depressed, Hopeless 3 0 0  PHQ - 2 Score 6 0 0  Altered sleeping 3 0 0  Tired, decreased energy 3 0 0  Change in appetite 3 0 0  Feeling bad or failure about yourself  3 0 0  Trouble concentrating 1 0 0  Moving slowly or fidgety/restless 1 0 0  Suicidal thoughts 1 0 0  PHQ-9 Score 21 0 0  Difficult doing work/chores  Not difficult at all Not difficult at all     GAD-7 Assessments:     07/03/2024    3:19 PM 12/25/2023    2:08 PM  GAD 7 : Generalized Anxiety Score  Nervous, Anxious, on Edge 0 0  Control/stop worrying 3 0  Worry too much - different things 3 0  Trouble relaxing 3 0  Restless 0 0  Easily annoyed  or irritable 3 0  Afraid - awful might happen 3 0  Total GAD 7 Score 15 0  Anxiety Difficulty  Not difficult at all      Social History:  Household:  live with husband of 44 years.  Marital status:  married Number of Children:  3 children 7 grandchildren and a goddaughter--had 11 years (taken from mom) haven't heard from her since July 27. They won't let me see her.  Employment:  [employer] w/ husband. Pt and husband both retired.  Education:   High school  Psychiatric Review of systems: Insomnia: insomnia most nights (couple of years)  Changes in appetite: drecreased appetite Decreased need for sleep: No--feels tired the next day. Family history of bipolar disorder: Yes daughter (bipolar disorder) Hallucinations: Yes --sees shadows. Sometimes I hear my mom calling my name.  Paranoia: No    Psychotropic medications: none  Current medications: Current Outpatient Medications on File Prior to Visit  Medication Sig Dispense Refill   amLODipine  (NORVASC ) 10 MG tablet Take 1 tablet (10 mg total) by mouth daily. 90 tablet 2   Ascorbic Acid  (VITAMIN C  PO) Take 500 mg by mouth every evening.     aspirin  EC 81 MG tablet Take 4 tablets (325 mg total) by mouth daily. Swallow whole. 100 tablet 2   Continuous Glucose Sensor (DEXCOM G7 SENSOR) MISC APPLY SENSOR TO SKIN FOR CONTINUOUS BLOOD SUGAR MONITORING, REPLACE EVERY 10 DAYS 9 each 1   cyclobenzaprine  (FLEXERIL ) 5 MG tablet One tab po at bedtime prn 30 tablet 0   Dapagliflozin  Pro-metFORMIN  ER (XIGDUO  XR) 01-999 MG TB24 Take 2 tablets by mouth daily before supper. 180 tablet 3   glimepiride  (AMARYL ) 4 MG tablet Take 2 tablets (8 mg total) by mouth daily before supper. 180 tablet 3   glucose blood (ONETOUCH VERIO) test strip Use as directed to check blood sugars 2 times per day dx: e11.65 100 each 10   ibuprofen  (ADVIL ) 800 MG tablet Take 1 tablet (800 mg total) by mouth 3 (three) times daily. 60 tablet 1   insulin  degludec (TRESIBA  FLEXTOUCH) 200 UNIT/ML FlexTouch Pen Inject 20 Units into the skin at bedtime. 30 mL 3   Insulin  Pen Needle 32G X 4 MM MISC 1 Device by Does not apply route daily in the afternoon. 100 each 3   methylcellulose (CITRUCEL) oral powder Use as directed, daily     nitroGLYCERIN  (NITROSTAT ) 0.4 MG SL tablet Place 1 tablet (0.4 mg total) under the tongue every 5 (five) minutes as needed for chest pain. UP TO 3 TABLETS 25 tablet PRN   Peppermint Oil (IBGARD) 90 MG CPCR  Take as directed 12 capsule 0   rosuvastatin  (CRESTOR ) 40 MG tablet Take 1 tablet (40 mg total) by mouth daily. 90 tablet 3   telmisartan  (MICARDIS ) 40 MG tablet Take 1 tablet (40 mg total) by mouth daily. 90 tablet 3   Vitamin D , Ergocalciferol , (DRISDOL ) 1.25 MG (50000 UNIT) CAPS capsule TAKE 1 CAPSULE BY MOUTH ONCE WEEKLY 12 capsule 1   Zinc  Sulfate (ZINCATE PO) Take 50 mg by mouth every evening.     No current facility-administered medications on file prior to visit.     Patient taking medications as prescribed:  Yes Side effects reported: No   Psychiatric History  Have you ever been treated for a mental health problem? Yes If Yes, when were you treated and whom did you see (psychiatrist/counselor) ? When 10+ years ago  Name of provider unknown--only went one time  Psychiatric History  Depression: Yes--no tx other than one counseling session  Anxiety: Yes Mania: No Psychosis: Yes--I see shadows and hear my mom's voice call my name sometimes PTSD symptoms: No  Past Psychiatric History/Hospitalization(s): Hospitalization for psychiatric illness: No Prior Suicide Attempts: Yes--one attempt via OD in the past. Pt did not ingest and was not treated afterwards Prior Self-injurious behavior: No  Have you ever had thoughts of harming yourself or others or attempted suicide? Suicide plan --in the past pt reports that when her husband was cheating on her she had plans to kill self by overdose--had pills in her hand and her grandmother's voice told her not to do it. This was years ago and pt reports that she has not had thoughts, plans, or intent to follow through since that time.  Traumatic Experiences: History or current traumatic events (natural disaster, house fire, etc.)? Yes--loss of mother 25 years ago.  Wouldn't eat for 2 months. Depressed.  Isolated/withdrew.  History or current physical trauma?  no History or current emotional trauma?  Yes--husband infidelity History or  current sexual trauma?  no History or current domestic or intimate partner violence?  no PTSD symptoms if any traumatic experiences no   Alcohol and/or Substance Use History   Tobacco Alcohol Other substances  Current use Pt denies (AUDIT-C screening) Social drinks--no more than 1 on rare occassions Pt denies  Past use Cigarette use in past Social use THC in past  Past treatment Pt denies  Pt denies Pt denies   Psychologist, occupational Health from 07/03/2024 in [facility]  AUDIT-C Score 1     Withdrawal Potential: none  [place] Suicide Severity Rating Scale:  Flowsheet Row Integrated Behavioral Health from 07/03/2024 in [facility] ED from 01/15/2023 in [facility] Emergency Department at [facility] ED from 08/11/2022 in [facility] Department at [facility]  C-SSRS RISK CATEGORY Moderate Risk No Risk No Risk     Guns in the home (secured):  no   The patient demonstrates the following risk factors for suicide: Chronic risk factors for suicide include: psychiatric disorder of depression, previous suicide attempts x 1, and chronic pain. Acute risk factors for suicide include: family or marital conflict and social withdrawal/isolation. Protective factors for this patient include: positive social support, responsibility to others (children, family), hope for the future, and religious beliefs against suicide. Considering these factors, the overall suicide risk at this point appears to be low. Patient is appropriate for outpatient follow up.  Danger to Others Risk Assessment Danger to others risk factors:  none Patient endorses recent thoughts of harming others:  pt denies  Dynamic Appraisal of Situational Aggression (DASA): none  BH Counselor discussed emergency crisis plan with client and provided local emergency services resources.  Mental status exam:   General Appearance  [place]:  Neat Eye Contact:  Good Motor Behavior:  Normal Speech:  Normal Level of Consciousness:  Alert Mood:  Depressed Affect:  Depressed and Tearful Anxiety Level:  Minimal Thought Process:  Coherent Thought Content:  WNL Perception:  Normal Judgment:  Good Insight:  Present  Diagnosis:  Encounter Diagnosis  Name Primary?   Adjustment disorder with depressed mood Yes     Goals: Increase healthy adjustment to current life circumstances   Interventions: Mindfulness or Relaxation Training and Behavioral Activation   Follow-up Plan: IBH collaborative care management including counseling and psychiatric medication consultation   Dniya Neuhaus R Akoni Parton, LCSW  Assessment  completed by Tawni Brisker, MSW, LCSW  on 07/03/24

## 2024-07-04 ENCOUNTER — Ambulatory Visit: Payer: Self-pay

## 2024-07-14 ENCOUNTER — Other Ambulatory Visit: Payer: Self-pay | Admitting: Internal Medicine

## 2024-07-14 DIAGNOSIS — N1831 Chronic kidney disease, stage 3a: Secondary | ICD-10-CM

## 2024-07-15 ENCOUNTER — Ambulatory Visit: Admitting: Internal Medicine

## 2024-07-15 ENCOUNTER — Encounter: Payer: Self-pay | Admitting: Internal Medicine

## 2024-07-15 VITALS — BP 124/80 | HR 83 | Ht 61.0 in | Wt 163.0 lb

## 2024-07-15 DIAGNOSIS — E1142 Type 2 diabetes mellitus with diabetic polyneuropathy: Secondary | ICD-10-CM

## 2024-07-15 DIAGNOSIS — Z794 Long term (current) use of insulin: Secondary | ICD-10-CM | POA: Diagnosis not present

## 2024-07-15 DIAGNOSIS — E785 Hyperlipidemia, unspecified: Secondary | ICD-10-CM

## 2024-07-15 DIAGNOSIS — E1129 Type 2 diabetes mellitus with other diabetic kidney complication: Secondary | ICD-10-CM | POA: Diagnosis not present

## 2024-07-15 DIAGNOSIS — R809 Proteinuria, unspecified: Secondary | ICD-10-CM

## 2024-07-15 MED ORDER — TRESIBA FLEXTOUCH 200 UNIT/ML ~~LOC~~ SOPN: 16.0000 [IU] | PEN_INJECTOR | Freq: Every day | SUBCUTANEOUS | 4 refills | Status: DC

## 2024-07-15 MED ORDER — INSULIN PEN NEEDLE 32G X 4 MM MISC: 1.0000 | Freq: Every day | 3 refills | Status: AC

## 2024-07-15 MED ORDER — ROSUVASTATIN CALCIUM 40 MG PO TABS: 40.0000 mg | ORAL_TABLET | Freq: Every day | ORAL | 3 refills | Status: AC

## 2024-07-15 MED ORDER — DAPAGLIFLOZIN PRO-METFORMIN ER 5-1000 MG PO TB24: 1.0000 | ORAL_TABLET | Freq: Every day | ORAL | 3 refills | Status: AC

## 2024-07-15 MED ORDER — GLIPIZIDE 5 MG PO TABS: 5.0000 mg | ORAL_TABLET | Freq: Two times a day (BID) | ORAL | 3 refills | Status: AC

## 2024-07-15 MED ORDER — TELMISARTAN 40 MG PO TABS: 40.0000 mg | ORAL_TABLET | Freq: Every day | ORAL | 3 refills | Status: AC

## 2024-07-15 NOTE — Patient Instructions (Addendum)
 Start Glipizide  5 mg, 1 tablet before breakfast and 1 tablet before supper Stop glimepiride  4 mg, 2 tablets before Supper Decrease Xigduo  01-999 mg, 1 tablet daily Decrease Tresiba  16 units daily     HOW TO TREAT LOW BLOOD SUGARS (Blood sugar LESS THAN 70 MG/DL) Please follow the RULE OF 15 for the treatment of hypoglycemia treatment (when your (blood sugars are less than 70 mg/dL)   STEP 1: Take 15 grams of carbohydrates when your blood sugar is low, which includes:  3-4 GLUCOSE TABS  OR 3-4 OZ OF JUICE OR REGULAR SODA OR ONE TUBE OF GLUCOSE GEL    STEP 2: RECHECK blood sugar in 15 MINUTES STEP 3: If your blood sugar is still low at the 15 minute recheck --> then, go back to STEP 1 and treat AGAIN with another 15 grams of carbohydrates.

## 2024-07-15 NOTE — Progress Notes (Signed)
 Name: Teresa Grant  MRN/ DOB: [id number], [birth year]   Age/ Sex: 63 y.o., female    PCP: Jarold Medici, MD   Reason for Endocrinology Evaluation: Type 2 Diabetes Mellitus     Date of Initial Endocrinology Visit: 06/15/2021    PATIENT IDENTIFIER: Teresa Grant is a 63 y.o. female with a past medical history of T2DM, HTN and Dyslipidemia . The patient presented for initial endocrinology clinic visit on 06/15/2021 for consultative assistance with her diabetes management.    HPI: [name] was    Diagnosed with DM 2008 Prior Medications tried/Intolerance: Glipizide  , januvia  Hemoglobin A1c has ranged from 6.8% in 2020, peaking at 11.2% in 2022.   On her initial visit to our clinic she had an A1c of 8.5%, we continued Xigduo , increase Ozempic , and decrease basal insulin    Stopped Ozempic  07/2022 due to hx of pancreatitis , presenting to ED 06/2022 with N/V and slight elevation in lipase  Started glimepiride  01/2023 due to increased A1c from 6.4% to 11.4%   She met with Rock Dasen 08/2023  SUBJECTIVE:   During the last visit (04/14/2024): A1c was 8.5 %   Today (07/15/24): [name] is here for follow-up on diabetes management.  She checks glucose multiple times daily through CGM, she has been noted with hypoglycemia, she is symptomatic .  She continues with occasional abdominal pain  She has been constipated No vomiting but has noted nausea     HOME DIABETES REGIMEN: Glimepiride  4 mg, 2 tabs before Supper  Xigduo  01-999 mg twice daily  Tresiba  20 units daily  Rosuvastatin  40 mg daily Telmisartan  40 mg daily    Statin: yes ACE-I/ARB: Intolerant to Lisinopril due to cough    CONTINUOUS GLUCOSE MONITORING RECORD INTERPRETATION    Dates of Recording: 10/8-10/21/2025  Sensor description:dexcom  Results statistics:   CGM use % of time 93  Average and SD 183/82  Time in range 52 %  % Time Above 180 21  % Time above 250 23  % Time Below  target 4   Glycemic patterns summary: BGs trend down overnight and increased throughout the day  Hyperglycemic episodes postprandial  Hypoglycemic episodes occurred at night  Overnight periods: Trends low    DIABETIC COMPLICATIONS: Microvascular complications:   Denies: CKD, retinopathy, neuropathy  Last eye exam: Completed 01/24/2023  Macrovascular complications:   Denies: CAD, PVD, CVA   PAST HISTORY: Past Medical History:  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   Diabetes mellitus    Dysrhythmia    1985   Heart murmur    HLD (hyperlipidemia)    Hypertension    Kidney stone on right side 03/2013   Pneumonia    7 yrs ago   Past Surgical History:  Past Surgical History:  Procedure Laterality Date   ABDOMINAL HYSTERECTOMY  2001   BACK SURGERY     CERVICAL SPINE SURGERY  06/06/2019   Dr. Gillie, performed at surgical center   CESAREAN SECTION  1990   COLONOSCOPY     Dr. Debrah normal   FOOT SURGERY Bilateral    LEFT HEART CATH AND CORONARY ANGIOGRAPHY N/A 11/03/2021   Procedure: LEFT HEART CATH AND CORONARY ANGIOGRAPHY;  Surgeon: Mady Bruckner, MD;  Location: [facility];  Service: Cardiovascular;  Laterality: N/A;   LUMBAR LAMINECTOMY/DECOMPRESSION MICRODISCECTOMY  03/20/2012   Procedure: LUMBAR LAMINECTOMY/DECOMPRESSION MICRODISCECTOMY;  Surgeon: Oneil Rodgers Priestly, MD;  Location: [facility] OR;  Service: Orthopedics;  Laterality:  Left;  Left sided lumbar 4-5 microdisectomy   SPINE SURGERY  2013    Social History:  reports that she quit smoking about 20 years ago. Her smoking use included cigarettes. She started smoking about 45 years ago. She has a 12.5 pack-year smoking history. She has never used smokeless tobacco. She reports that she does not currently use alcohol. She reports that she does not use drugs. Family History:  Family History  Problem Relation Age of Onset   Hypertension Mother    Stroke Mother    Heart  attack Mother 34   Heart disease Mother    Hypertension Father    Kidney disease Father    Hypertension Sister    Colon polyps Sister    Hypertension Brother    Hypertension Maternal Aunt    Hypertension Maternal Uncle    Esophageal cancer Maternal Uncle    Diabetes Paternal Aunt    Diabetes Paternal Uncle    Colon cancer Cousin    Cancer Neg Hx    Rectal cancer Neg Hx    Stomach cancer Neg Hx    Breast cancer Neg Hx      HOME MEDICATIONS: Allergies as of 07/15/2024       Reactions   Lisinopril Cough   Atorvastatin  Other (See Comments)   Hydrochlorothiazide Other (See Comments)   pancreatitis        Medication List        Accurate as of July 15, 2024  8:58 AM. If you have any questions, ask your nurse or doctor.          amLODipine  10 MG tablet Commonly known as: NORVASC  Take 1 tablet (10 mg total) by mouth daily.   aspirin  EC 81 MG tablet Take 4 tablets (325 mg total) by mouth daily. Swallow whole.   Citrucel oral powder Generic drug: methylcellulose Use as directed, daily   cyclobenzaprine  5 MG tablet Commonly known as: FLEXERIL  One tab po at bedtime prn   Dapagliflozin  Pro-metFORMIN  ER 01-999 MG Tb24 Commonly known as: Xigduo  XR Take 2 tablets by mouth daily before supper.   Dexcom G7 Sensor Misc APPLY SENSOR TO SKIN FOR CONTINUOUS BLOOD SUGAR MONITORING, REPLACE EVERY 10 DAYS   glimepiride  4 MG tablet Commonly known as: AMARYL  Take 2 tablets (8 mg total) by mouth daily before supper.   IBgard 90 MG Cpcr Generic drug: Peppermint Oil Take as directed   ibuprofen  800 MG tablet Commonly known as: ADVIL  Take 1 tablet (800 mg total) by mouth 3 (three) times daily.   Insulin  Pen Needle 32G X 4 MM Misc 1 Device by Does not apply route daily in the afternoon.   nitroGLYCERIN  0.4 MG SL tablet Commonly known as: NITROSTAT  Place 1 tablet (0.4 mg total) under the tongue every 5 (five) minutes as needed for chest pain. UP TO 3 TABLETS    OneTouch Verio test strip Generic drug: glucose blood Use as directed to check blood sugars 2 times per day dx: e11.65   rosuvastatin  40 MG tablet Commonly known as: CRESTOR  Take 1 tablet (40 mg total) by mouth daily.   telmisartan  40 MG tablet Commonly known as: Micardis  Take 1 tablet (40 mg total) by mouth daily.   Tresiba  FlexTouch 200 UNIT/ML FlexTouch Pen Generic drug: insulin  degludec Inject 20 Units into the skin at bedtime.   VITAMIN C  PO Take 500 mg by mouth every evening.   Vitamin D  (Ergocalciferol ) 1.25 MG (50000 UNIT) Caps capsule Commonly known as: DRISDOL  TAKE 1 CAPSULE  BY MOUTH ONCE WEEKLY   ZINCATE PO Take 50 mg by mouth every evening.         ALLERGIES: Allergies  Allergen Reactions   Lisinopril Cough   Atorvastatin  Other (See Comments)   Hydrochlorothiazide Other (See Comments)    pancreatitis     REVIEW OF SYSTEMS: A comprehensive ROS was conducted with the patient and is negative except as per HPI     OBJECTIVE:   VITAL SIGNS:BP 124/80 (BP Location: Left Arm, Patient Position: Sitting, Cuff Size: Normal)   Pulse 83   Ht 5' 1 (1.549 m)   Wt 163 lb (73.9 kg)   SpO2 97%   BMI 30.80 kg/m    PHYSICAL EXAM:  General: Pt appears well and is in NAD  Lungs: Clear with good BS bilat   Heart: RRR   Extremities:  Lower extremities - No pretibial edema  Neuro: MS is good with appropriate affect, pt is alert and Ox3    DM foot exam: 11/14/2023 per podiatry     DATA REVIEWED:  Lab Results  Component Value Date   HGBA1C 8.4 (H) 06/25/2024   HGBA1C 8.5 (A) 04/14/2024   HGBA1C 7.5 (A) 12/05/2023    Latest Reference Range & Units 06/25/24 12:32  Sodium 134 - 144 mmol/L 142  Potassium 3.5 - 5.2 mmol/L 4.0  Chloride 96 - 106 mmol/L 105  CO2 20 - 29 mmol/L 19 (L)  Glucose 70 - 99 mg/dL 57 (L)  BUN 8 - 27 mg/dL 19  Creatinine 9.42 - 8.99 mg/dL 8.37 (H)  Calcium  8.7 - 10.3 mg/dL 89.7  BUN/Creatinine Ratio 12 - 28  12  eGFR >59  mL/min/1.73 35 (L)  Alkaline Phosphatase 49 - 135 IU/L 148 (H)  Albumin 3.9 - 4.9 g/dL 4.7  AST 0 - 40 IU/L 19  ALT 0 - 32 IU/L 16  Total Protein 6.0 - 8.5 g/dL 7.5  Total Bilirubin 0.0 - 1.2 mg/dL 0.4     Latest Reference Range & Units 06/25/24 12:32  Total CHOL/HDL Ratio 0.0 - 4.4 ratio 2.5  Cholesterol, Total 100 - 199 mg/dL 99 (L)  HDL Cholesterol >39 mg/dL 39 (L)  Triglycerides 0 - 149 mg/dL 71  VLDL Cholesterol Cal 5 - 40 mg/dL 15  LDL Chol Calc (NIH) 0 - 99 mg/dL 45     ASSESSMENT / PLAN / RECOMMENDATIONS:   1) Type 2 Diabetes Mellitus, poorly controlled, With Neuropathic and complications and microabuminuria - Most recent A1c of 8.4%. Goal A1c < 7.0 %.    -Patient continues with hyperglycemia -She was on glipizide  in the past, which was discontinued due to hypoglycemia -I stopped Ozempic  due to history of pancreatitis and chronic abdominal pain - The patient is on maximum dose of glimepiride , I have recommended either switching back to glipizide  or prandial insulin , the patient opted to try glipizide  again - I will decrease insulin  due to hypoglycemia overnight - Patient with a GFR of 35, will decrease Xigduo  to 1 tablet daily  MEDICATIONS:  Stop glimepiride  4 mg daily, 2  before supper Start glipizide  5 mg, 1 tablet before breakfast and 1 tablet before supper Decrease Xigduo  01-999 mg,1  tablet daily Decrease Tresiba  to 16 units daily   EDUCATION / INSTRUCTIONS: BG monitoring instructions: Patient is instructed to check her blood sugars 1 times a day, fasting . Call [facility] if: BG persistently < 70  I reviewed the Rule of 15 for the treatment of hypoglycemia in detail with the patient.  Literature supplied.   2) Diabetic complications:  Eye: Does not have known diabetic retinopathy.  Neuro/ Feet: Does  have known diabetic peripheral neuropathy based on exam 9/21/202 Renal: Patient does not have known baseline CKD. She is  on an ACEI/ARB at  present.  3) Dyslipidemia:    - LDL was above goal at 100 mg/dL in 7978, We switched pravastatin  to  Atorvastatin ,   by March, 2025 switched from atorvastatin  to rosuvastatin  due to LDL continuing to be above goal - Most recent LDL is at goal - No change  Medication  Continue rosuvastatin  40 mg daily   4) Microalbuminuria:  -Emphasized the importance of optimizing glucose control and compliance with ARB intake -I increase telmisartan  from 20 mg to 40 mg by March, 2025 due to worsening MA/CR ratio   Medication Continue telmisartan  40 mg    F/U in 3 months    Signed electronically by: Stefano Redgie Butts, MD  [facility]  [facility] [address] [place] [place], [place] [address] Phone: [phone] FAX: [phone]   CC: Jarold Medici, MD [address] [place] [address] Phone: [phone]  Fax: [phone]    Return to Endocrinology clinic as below: Future Appointments  Date Time Provider Department Center  07/17/2024 10:30 AM Juan Tawni JONELLE KEN [facility]  07/29/2024  9:00 AM [facility] [address]  08/20/2024  8:20 AM [facility] [address]  12/25/2024 11:20 AM Jarold Medici, MD [facility] [phone] [place]

## 2024-07-16 ENCOUNTER — Telehealth (INDEPENDENT_AMBULATORY_CARE_PROVIDER_SITE_OTHER): Payer: Self-pay | Admitting: Licensed Clinical Social Worker

## 2024-07-16 ENCOUNTER — Other Ambulatory Visit: Payer: Self-pay | Admitting: Internal Medicine

## 2024-07-16 DIAGNOSIS — F4321 Adjustment disorder with depressed mood: Secondary | ICD-10-CM

## 2024-07-16 MED ORDER — ESCITALOPRAM OXALATE 10 MG PO TABS: 10.0000 mg | ORAL_TABLET | Freq: Every day | ORAL | 2 refills | Status: DC

## 2024-07-16 MED ORDER — RAMELTEON 8 MG PO TABS: 8.0000 mg | ORAL_TABLET | Freq: Every evening | ORAL | 1 refills | Status: AC | PRN

## 2024-07-16 NOTE — BH Specialist Note (Signed)
 Attestation signed by Warren Becker, PMHNP, DNP 07/16/2024 10:27 AM   Collaborative Care Psychiatric Consultant Case Review   Assessment/Provisional Diagnosis 63 year old female with history of multiple medical issues including HTN, kidney disease, and diabetes 2. The patient is referred for anxiety and depression.   Provisional Diagnosis: # MDD, Recurrent, severe, without psychosis.     Recommendation 1. Recommend Lexapro 10 mg daily 2. Recommend Rozerem 8 mg daily at bedtime PRN sleep 3. BH specialist to follow up.   Thank you for your consult. Please contact our collaborative care team for any questions or concerns.   I spent 20 minutes chart reviewing, discussing with [facility] Speicalist and documenting in the chart.   The above treatment considerations and suggestions are based on consultation with the [facility] specialist and/or PCP and a review of information available in the shared registry and the patient's Electronic Health Record (EHR). I have not personally examined the patient. All recommendations should be implemented with consideration of the patient's relevant prior history and current clinical status. Please feel free to call me with any questions about the care of this patient.   Virtual Behavioral Health Treatment Plan Team Note  MRN: [id number] NAME: Teresa Grant  DATE: 07/16/24  Start time: Start Time: 1130 End time: Stop Time: 1145 Total time: Total Time in Minutes (Visit): 15  Total number of Virtual BH Treatment Team Plan encounters: 1/4  Treatment Team Attendees: Shinika Estelle, LCSW and Sharlot Becker, DNP   Diagnoses:    ICD-10-CM   1. Adjustment disorder with depressed mood  F43.21       Goals, Interventions and Follow-up Plan Goals: Increase healthy adjustment to current life circumstances Interventions: Mindfulness or Relaxation Training Behavioral Activation  Medication Management Recommendations: Recommend Lexapro 10 mg daily, Recommend Rozerem 8  mg daily at bedtime PRN sleep  Follow-up Plan: IBH collaborative care management including counseling and psychiatric medication consultation   History of the present illness Presenting Problem/Current Symptoms: Teresa Grant is a 63 y.o. female with history of depression seen in consultation at the request of Catheryn Slocumb, MD for establishment of [facility] management.   Pt is currently taking the following psychiatric medications: none .  Current symptoms include: insomnia, feeling sad/down/depressed, hopelessness, worthlessness, little interest or pleasure in doing things, feeling low energy, feeling bad about self, decreased appetite, anxiety, worrying, trouble relaxing.  Pt denies SI, HI, or AVH at time of session. Pt denies substance use.   Psychiatric History   Have you ever been treated for a mental health problem? Yes If Yes, when were you treated and whom did you see (psychiatrist/counselor) ? When 10+ years ago  Name of provider unknown--only went one time   Psychiatric History  Depression: Yes--no tx other than one counseling session  Anxiety: Yes Mania: No Psychosis: Yes--I see shadows and hear my mom's voice call my name sometimes PTSD symptoms: No  Psychosocial stressors Flowsheet Row Integrated Behavioral Health from 07/03/2024 in [facility]  Current Stressors Family conflict, Grief/losses, Peer relationships  [loss of mother, sibling conflict, marital conflict]  Familial Stressors None  Sleep Decreased, Difficulty falling asleep, Difficulty staying asleep, Frequent awakening  Appetite Decreased, Loss of appetite  Coping ability Overwhelmed  Patient taking medications as prescribed No prescribed medications    Self-harm Behaviors Risk Assessment Flowsheet Row Integrated Behavioral Health from 07/03/2024 in [facility]  Self-harm risk factors Family or marital conflict, Previous  suicide attempts, Social  withdrawal/isolation  Have you recently had any thoughts about harming yourself? No    Screenings PHQ-9 Assessments:     07/03/2024    3:09 PM 06/25/2024   10:50 AM 12/25/2023    2:07 PM  Depression screen PHQ 2/9  Decreased Interest 3 0 0  Down, Depressed, Hopeless 3 0 0  PHQ - 2 Score 6 0 0  Altered sleeping 3 0 0  Tired, decreased energy 3 0 0  Change in appetite 3 0 0  Feeling bad or failure about yourself  3 0 0  Trouble concentrating 1 0 0  Moving slowly or fidgety/restless 1 0 0  Suicidal thoughts 1 0 0  PHQ-9 Score 21 0 0  Difficult doing work/chores Somewhat difficult Not difficult at all Not difficult at all   GAD-7 Assessments:     07/03/2024    3:19 PM 12/25/2023    2:08 PM  GAD 7 : Generalized Anxiety Score  Nervous, Anxious, on Edge 1 0  Control/stop worrying 3 0  Worry too much - different things 3 0  Trouble relaxing 3 0  Restless 0 0  Easily annoyed or irritable 3 0  Afraid - awful might happen 3 0  Total GAD 7 Score 16 0  Anxiety Difficulty Somewhat difficult Not difficult at all    Past Medical History 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   Diabetes mellitus    Dysrhythmia    1985   Heart murmur    HLD (hyperlipidemia)    Hypertension    Kidney stone on right side 03/2013   Pneumonia    7 yrs ago    Vital signs: There were no vitals filed for this visit.  Allergies:  Allergies as of 07/16/2024 - Review Complete 07/15/2024  Allergen Reaction Noted   Lisinopril Cough 11/07/2010   Atorvastatin  Other (See Comments) 09/15/2021   Hydrochlorothiazide Other (See Comments) 01/23/2008    Medication History Current medications:  Outpatient Encounter Medications as of 07/16/2024  Medication Sig   amLODipine  (NORVASC ) 10 MG tablet Take 1 tablet (10 mg total) by mouth daily.   Ascorbic Acid  (VITAMIN C  PO) Take 500 mg by mouth every evening.   aspirin  EC 81 MG tablet Take 4 tablets (325 mg  total) by mouth daily. Swallow whole.   Continuous Glucose Sensor (DEXCOM G7 SENSOR) MISC APPLY SENSOR TO SKIN FOR CONTINUOUS BLOOD SUGAR MONITORING, REPLACE EVERY 10 DAYS   cyclobenzaprine  (FLEXERIL ) 5 MG tablet One tab po at bedtime prn   Dapagliflozin  Pro-metFORMIN  ER (XIGDUO  XR) 01-999 MG TB24 Take 1 tablet by mouth daily in the afternoon.   glipiZIDE  (GLUCOTROL ) 5 MG tablet Take 1 tablet (5 mg total) by mouth 2 (two) times daily before a meal.   glucose blood (ONETOUCH VERIO) test strip Use as directed to check blood sugars 2 times per day dx: e11.65   ibuprofen  (ADVIL ) 800 MG tablet Take 1 tablet (800 mg total) by mouth 3 (three) times daily.   insulin  degludec (TRESIBA  FLEXTOUCH) 200 UNIT/ML FlexTouch Pen Inject 16 Units into the skin at bedtime.   Insulin  Pen Needle 32G X 4 MM MISC 1 Device by Does not apply route daily in the afternoon.   methylcellulose (CITRUCEL) oral powder Use as directed, daily   nitroGLYCERIN  (NITROSTAT ) 0.4 MG SL tablet Place 1 tablet (0.4 mg total) under the tongue every 5 (five) minutes as needed for chest pain. UP TO 3 TABLETS   Peppermint Oil (IBGARD) 90  MG CPCR Take as directed   rosuvastatin  (CRESTOR ) 40 MG tablet Take 1 tablet (40 mg total) by mouth daily.   telmisartan  (MICARDIS ) 40 MG tablet Take 1 tablet (40 mg total) by mouth daily.   Vitamin D , Ergocalciferol , (DRISDOL ) 1.25 MG (50000 UNIT) CAPS capsule TAKE 1 CAPSULE BY MOUTH ONCE WEEKLY   Zinc  Sulfate (ZINCATE PO) Take 50 mg by mouth every evening.   No facility-administered encounter medications on file as of 07/16/2024.     Scribe for Treatment Team: Albana Saperstein R Tamara Monteith, LCSW

## 2024-07-17 ENCOUNTER — Ambulatory Visit (INDEPENDENT_AMBULATORY_CARE_PROVIDER_SITE_OTHER): Admitting: Licensed Clinical Social Worker

## 2024-07-17 DIAGNOSIS — F4321 Adjustment disorder with depressed mood: Secondary | ICD-10-CM | POA: Diagnosis not present
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
## 2024-07-17 NOTE — Patient Instructions (Signed)
 Using Behavioral Activation to manage stress/depression symptoms    Identify/understand your own mood triggers.   Structure your day--get up around the same time, eat meals/snacks around the same time, go to bed around the same time.   Purposefully schedule self care time and time to complete tasks. This can include quiet time  Stimulate your brain--go for a walk, text/call a friend or family member, if you are indoors--go outside (and vice versa), go for a drive, go to a store with bright colors and bright lights. Try to do things in a different way--drive to your favorite places using an alternative route, or instead of starting on the right side of the grocery store when shopping, start on the left side. You might feel a bit uncomfortable doing things outside of the comfort zone, but this is helping the brain create new neural pathways and is very healthy for brain/emotional health.   Physical movement based on your ability. If you can go for a walk, do stretches, even waving your hands to music can trigger feel-good endorphins in the brain and help release physical tension we all hold in our bodies.  Even 5 minutes can make a difference.   Be intentional about doing things that bring you joy (or used to bring you joy), and look for the things in every day that make you happy.  Seek those glimmers of joy each day.  Set a timer for 5 minutes for a harder task (ex. Laundry, washing dishes).  Allow yourself to work distraction-free for 5 minutes, then stop when the timer goes off. If you need a break, take a break. If you want to continue working then set another timer for whatever time you choose.   Limit or eliminate substance use including alcohol, marijuana, or recreational use of prescription medication.  Let in the light!! Open the window blinds, curtains and let natural light in. Even sitting near a window or sitting outside can boost your mood, especially in the wintertime when there  is less daylight.    Things to envision for ourselves to to improve inspiration, motivation, and initiative :  improving physical wellness, focus on family relationships, focusing on our own mental/emotional well being, being a part of a bigger community, finding a hobby, being a part of something that fosters personal growth, engaging socially with others (even digitally!!!)    Emergency Resources:  National Suicide & Crisis Lifeline: Call or text 988  Crisis Text Line: Text HOME to 463-336-3219  [facility]  [address], [place], [place] [address] [phone] or [phone] WALK-IN URGENT CARE 24/7 FOR ANYONE [address] [place], [place]  [phone] Fax: [phone] [email or web address] *Interpreters available *Accepts all insurance and uninsured for Urgent Care needs *Accepts Medicaid and uninsured for outpatient treatment (below)

## 2024-07-29 ENCOUNTER — Other Ambulatory Visit: Payer: Self-pay

## 2024-07-29 DIAGNOSIS — N1831 Chronic kidney disease, stage 3a: Secondary | ICD-10-CM

## 2024-07-30 ENCOUNTER — Ambulatory Visit: Payer: Self-pay | Admitting: Internal Medicine

## 2024-07-30 LAB — CMP14+EGFR
ALT: 14 IU/L (ref 0–32)
AST: 16 IU/L (ref 0–40)
Albumin: 4.3 g/dL (ref 3.9–4.9)
Alkaline Phosphatase: 162 IU/L — ABNORMAL HIGH (ref 49–135)
BUN/Creatinine Ratio: 14 (ref 12–28)
BUN: 24 mg/dL (ref 8–27)
Bilirubin Total: 0.4 mg/dL (ref 0.0–1.2)
CO2: 22 mmol/L (ref 20–29)
Calcium: 10.3 mg/dL (ref 8.7–10.3)
Chloride: 106 mmol/L (ref 96–106)
Creatinine, Ser: 1.72 mg/dL — ABNORMAL HIGH (ref 0.57–1.00)
Globulin, Total: 3.1 g/dL (ref 1.5–4.5)
Glucose: 177 mg/dL — ABNORMAL HIGH (ref 70–99)
Potassium: 4.8 mmol/L (ref 3.5–5.2)
Sodium: 144 mmol/L (ref 134–144)
Total Protein: 7.4 g/dL (ref 6.0–8.5)
eGFR: 33 mL/min/1.73 — ABNORMAL LOW (ref >59)

## 2024-07-31 ENCOUNTER — Ambulatory Visit (INDEPENDENT_AMBULATORY_CARE_PROVIDER_SITE_OTHER): Payer: Self-pay | Admitting: Licensed Clinical Social Worker

## 2024-07-31 DIAGNOSIS — F4321 Adjustment disorder with depressed mood: Secondary | ICD-10-CM

## 2024-07-31 NOTE — BH Specialist Note (Signed)
 Integrated Behavioral Health Follow Up In-Person Visit  07/31/24  MRN: [id number] Name: Teresa Grant  Number of Integrated Behavioral Health Clinician visits: 4- Fourth Visit  Session Start time: 1030   Session End time: 1100  Total time in minutes: 30    Types of Service: Individual psychotherapy and Collaborative care   Subjective:Teresa Grant is a 63 y.o. female with history of depression seen in consultation at the request of Catheryn Slocumb, MD for establishment of [facility] management.   Pt is currently taking the following psychiatric medications: lexapro 10 mg and rozerem 8mg .  Current symptoms include: insomnia, feeling sad/down/depressed, hopelessness, worthlessness, little interest or pleasure in doing things, feeling low energy, feeling bad about self, decreased appetite, anxiety, worrying, trouble relaxing.  Pt denies SI, HI, or AVH at time of session. Pt denies substance use.    Duration of problem: ongoing; Severity of problem: moderate  Objective: Mood: Depressed and Affect: Depressed. Pt reporting improvement in overall mood since last session.  Risk of harm to self or others: No plan to harm self or others  Life Context:  Family and Social: Pt reporting that she communicated her thoughts and feelings to her husband about the [place] that [place]. Pt states that she is getting along well with her husband with no additional complaints.  School/Work: N/A  Self-Care: Pt reports that she is going to try to focus on activities that make her feel good about herself. Pt is also working harder at setting limits and boundaries with others.   Life Changes: Pt reports that she has experienced two losses within the family since last session. Pt feels that she is doing better about setting boundaries with others re: funeral home viewings and attending services. Pt feels that she is making choices to protect her overall emotional well being.  Pt reports that she spoke with her goddaughter about her granddaughter--feeling good that she is having civil conversations with goddaughter so that she may eventually get her granddaughter back.  Patient and/or Family's Strengths/Protective Factors: Social and Emotional competence, Concrete supports in place (healthy food, safe environments, etc.), and Parental Resilience  Goals Addressed: Patient will:  Reduce symptoms of: depression, mood instability, and stress   Increase knowledge and/or ability of: coping skills, healthy habits, self-management skills, and stress reduction   Demonstrate ability to: Increase healthy adjustment to current life circumstances  Progress towards Goals: Ongoing  Interventions: Interventions utilized:  Medication Monitoring, Supportive Counseling, Sleep Hygiene, and Communication Skills Standardized Assessments completed: C-SSRS Short  Flowsheet Row Integrated Behavioral Health from 07/17/2024 in [facility] Integrated Behavioral Health from 07/03/2024 in [facility] ED from 01/15/2023 in [facility]  C-SSRS RISK CATEGORY Moderate Risk Moderate Risk No Risk     Patient and/or Family Response: Pt reports that her children are supportive--pt reports that her husband is not very supportive at times. Pt feels her husband triggered her recently and she has been feeling down about it. Discussed correct ways of communicating with others without it feeling like a personal attack.   Patient Centered Plan:  Patient is on the following Treatment Plan(s):  1. Recommend Lexapro 10 mg daily 2. Recommend Rozerem 8 mg daily at bedtime PRN sleep 3. BH specialist to follow up.  Clinical Assessment/Diagnosis Encounter Diagnosis  Name Primary?   Adjustment disorder with depressed mood Yes    Assessment: Patient currently experiencing improvements in  irritability levels and mood.   Patient may benefit from ongoing IBH supports including counseling and medication management.  Plan: Follow up with behavioral health clinician on : 08/28/24 Behavioral recommendations:  Continue taking Lexapro 10 as prescribed. Pt can cut rozerem 8 in half if its triggering daytime sleepiness. Focus on behavioral activation recommendations Review Self Care recommendations Referral(s): Integrated Behavioral Health Services (In Clinic)  Pt reports that she wants to get the lexapro but is not comfortable with the sleep medication at time of assessment. Discussed concerns w/ pt and provided pt with psychoeducational information about each medication.   Marvetta Vohs R Latandra Loureiro, LCSW

## 2024-07-31 NOTE — Patient Instructions (Addendum)
 Using Behavioral Activation to manage stress/depression symptoms    Identify/understand your own mood triggers.   Structure your day--get up around the same time, eat meals/snacks around the same time, go to bed around the same time.   Purposefully schedule self care time and time to complete tasks. This can include quiet time  Stimulate your brain--go for a walk, text/call a friend or family member, if you are indoors--go outside (and vice versa), go for a drive, go to a store with bright colors and bright lights. Try to do things in a different way--drive to your favorite places using an alternative route, or instead of starting on the right side of the grocery store when shopping, start on the left side. You might feel a bit uncomfortable doing things outside of the comfort zone, but this is helping the brain create new neural pathways and is very healthy for brain/emotional health.   Physical movement based on your ability. If you can go for a walk, do stretches, even waving your hands to music can trigger feel-good endorphins in the brain and help release physical tension we all hold in our bodies.  Even 5 minutes can make a difference.   Be intentional about doing things that bring you joy (or used to bring you joy), and look for the things in every day that make you happy.  Seek those glimmers of joy each day.  Set a timer for 5 minutes for a harder task (ex. Laundry, washing dishes).  Allow yourself to work distraction-free for 5 minutes, then stop when the timer goes off. If you need a break, take a break. If you want to continue working then set another timer for whatever time you choose.   Limit or eliminate substance use including alcohol, marijuana, or recreational use of prescription medication.  Let in the light!! Open the window blinds, curtains and let natural light in. Even sitting near a window or sitting outside can boost your mood, especially in the wintertime when there  is less daylight.    Things to envision for ourselves to to improve inspiration, motivation, and initiative :  improving physical wellness, focus on family relationships, focusing on our own mental/emotional well being, being a part of a bigger community, finding a hobby, being a part of something that fosters personal growth, engaging socially with others (even digitally!!!)   ?? Self-Care Worksheet   ?? Mental & Emotional Self-Care Practice mindfulness or meditation for 5-10 minutes daily Journal your thoughts or feelings Set boundaries to protect your emotional energy Talk to a therapist or counselor Engage in positive self-talk  ?? Physical Self-Care Get 7-9 hours of sleep each night Move your body (walk, stretch, dance, exercise) Eat nourishing meals and stay hydrated Take breaks to rest and recharge Schedule regular health check-ups  ?? Creative Self-Care Try a new hobby or craft Listen to or play music Draw, paint, or color Write poetry or stories [place] or bake something new  ?? Social Self-Care Spend time with supportive friends or family Join a group or community with shared interests Say "no" when you need to Reach out to someone you trust Plan a fun outing or virtual hangout  ?? Spiritual Self-Care Reflect on your values and beliefs Spend time in nature Read spiritual or inspirational texts Practice gratitude daily Attend a place of worship or spiritual gathering  ?? Environmental Self-Care Adult Nurse a calming corner or sanctuary Use scents, lighting, or music to set a mood Organize your  schedule or workspace Reduce digital clutter  Emergency Resources:  National Suicide & Crisis Lifeline: Call or text 988  Crisis Text Line: Text HOME to (607)566-0894  [facility]  [address], [place], [place] [address] [phone] or [phone] [facility] URGENT CARE 24/7 FOR ANYONE [address] [place], [place]   [phone] Fax: [phone] [email or web address] *Interpreters available *Accepts all insurance and uninsured for Urgent Care needs *Accepts Medicaid and uninsured for outpatient treatment (below)      Emergency Resources:  National Suicide & Crisis Lifeline: Call or text 988  Crisis Text Line: Text HOME to (251) 200-0826  [facility]  [address] [place], [place] [address] [phone] or [phone] WALK-IN URGENT CARE 24/7 FOR ANYONE [address], [place], [place]  [phone] Fax: [phone] [email or web address] *Interpreters available *Accepts all insurance and uninsured for Urgent Care needs *Accepts Medicaid and uninsured for outpatient treatment (below)

## 2024-08-20 ENCOUNTER — Other Ambulatory Visit: Payer: Self-pay

## 2024-08-20 ENCOUNTER — Other Ambulatory Visit

## 2024-08-20 DIAGNOSIS — E1142 Type 2 diabetes mellitus with diabetic polyneuropathy: Secondary | ICD-10-CM

## 2024-08-20 LAB — HEMOGLOBIN A1C
Est. average glucose Bld gHb Est-mCnc: 203 mg/dL
Hgb A1c MFr Bld: 8.7 % — ABNORMAL HIGH (ref 4.8–5.6)

## 2024-08-22 ENCOUNTER — Ambulatory Visit: Payer: Self-pay | Admitting: Internal Medicine

## 2024-08-28 ENCOUNTER — Ambulatory Visit: Admitting: Licensed Clinical Social Worker

## 2024-08-28 DIAGNOSIS — F4321 Adjustment disorder with depressed mood: Secondary | ICD-10-CM

## 2024-08-28 NOTE — Patient Instructions (Signed)
 Using Behavioral Activation to manage stress/depression symptoms     Identify/understand your own mood triggers.   Structure your day--get up around the same time, eat meals/snacks around the same time, go to bed around the same time.   Purposefully schedule self care time and time to complete tasks. This can include quiet time.  Stimulate your brain--go for a walk, text/call a friend or family member, if you are indoors--go outside (and vice versa), go for a drive, go to a store with bright colors and bright lights. Try to do things in a different way--drive to your favorite places using an alternative route, or instead of starting on the right side of the grocery store when shopping, start on the left side. You might feel a bit uncomfortable doing things outside of the comfort zone, but this is helping the brain create new neural pathways and is very healthy for brain/emotional health.   Physical movement based on your ability. If you can go for a walk, do stretches, even waving your hands to music can trigger feel-good endorphins in the brain and help release physical tension we all hold in our bodies.  Even 5 minutes can make a difference.   Be intentional about doing things that bring you joy (or used to bring you joy), and look for the things in every day that make you happy.  Seek those glimmers of joy each day.  Set a timer for 5 minutes for a harder task (ex. Laundry, washing dishes).  Allow yourself to work distraction-free for 5 minutes, then stop when the timer goes off. If you need a break, take a break. If you want to continue working then set another timer for whatever time you choose.   Limit or eliminate substance use including alcohol, marijuana, or recreational use of prescription medication.  Let in the light!! Open the window blinds, curtains and let natural light in. Even sitting near a window or sitting outside can boost your mood, especially in the wintertime when  there is less daylight.   If you take medication to manage symptoms, remember to take all medications as they are prescribed (please read all labels!!)    Things to envision for ourselves to to improve inspiration, motivation, and initiative :  improving physical wellness, focus on family relationships, focusing on our own mental/emotional well being, being a part of a bigger community, finding a hobby, being a part of something that fosters personal growth, engaging socially with others (even digitally!!!)    Emergency Resources:  National Suicide & Crisis Lifeline: Call or text 988  Crisis Text Line: Text HOME to 478-823-2979  [facility]  [address], [place], [place] [address] [phone] or [phone] WALK-IN URGENT CARE 24/7 FOR ANYONE [address], [place], [place]  [phone] Fax: [phone] [email or web address] *Interpreters available *Accepts all insurance and uninsured for Urgent Care needs *Accepts Medicaid and uninsured for outpatient treatment (below)

## 2024-08-28 NOTE — BH Specialist Note (Signed)
 Integrated Behavioral Health Follow Up In-Person Visit  08/28/24  MRN: [id number] Name: Teresa Grant  Number of Integrated Behavioral Health Clinician visits: 5-Fifth Visit  Session Start time: 1030   Session End time: 1050  Total time in minutes: 20  Encounter Diagnosis  Name Primary?   Adjustment disorder with depressed mood Yes    Types of Service: Individual psychotherapy and Collaborative care   Subjective:Teresa Grant is a 63 y.o. female with history of depression seen in consultation at the request of Catheryn Slocumb, MD for establishment of [facility] management.   Pt is currently taking the following psychiatric medications: lexapro  10 mg and rozerem  8mg .  Current symptoms include: insomnia, feeling sad/down/depressed, hopelessness, worthlessness, little interest or pleasure in doing things, feeling low energy, feeling bad about self, decreased appetite, anxiety, worrying, trouble relaxing.  Pt denies SI, HI, or AVH at time of session. Pt denies substance use.   Duration of problem: ongoing; Severity of problem: moderate  Objective: Mood: Depressed and Affect: Depressed. Pt reporting improvement in overall mood since last session.  Risk of harm to self or others: No plan to harm self or others  Life Context: Patient reports positive social interaction with family and friends during Thanksgiving.  Patient feels her overall mood has improved and irritability/agitation decreased.  Family and Social: Patient reports that she is getting along with everyone, feels more self regulated, and is finding joy in her positive social interactions.  School/Work: N/A  Self-Care: Pt reports that she is going to try to focus on activities that make her feel good about herself. Pt is also working harder at setting limits and boundaries with others.   Life Changes: Pt reports that she has experienced two losses within the family since last session. Pt feels that she  is doing better about setting boundaries with others re: funeral home viewings and attending services. Pt feels that she is making choices to protect her overall emotional well being. Pt reports that she spoke with her goddaughter about her granddaughter--feeling good that she is having civil conversations with goddaughter so that she may eventually get her granddaughter back.  Patient reports that she is continuing to work hard to maintain the positive relationship with her god daughter, and hopes that her granddaughter Teresa Grant will be back with her.  Discussed importance of setting limits and setting boundaries with adult family members that continue to ask for money, etc.   Patient and/or Family's Strengths/Protective Factors: Social and Emotional competence, Concrete supports in place (healthy food, safe environments, etc.), and Parental Resilience  Goals Addressed: Patient will:  Reduce symptoms of: depression, mood instability, and stress   Increase knowledge and/or ability of: coping skills, healthy habits, self-management skills, and stress reduction   Demonstrate ability to: Increase healthy adjustment to current life circumstances  Progress towards Goals: Ongoing  Interventions: Interventions utilized:  Medication Monitoring, Supportive Counseling, Sleep Hygiene, and Communication Skills Standardized Assessments completed: C-SSRS Short  Flowsheet Row Integrated Behavioral Health from 08/28/2024 in [facility] Integrated [facility]al Health from 07/31/2024 in [facility]al Health from 07/17/2024 in [facility]  C-SSRS RISK CATEGORY Moderate Risk Moderate Risk Moderate Risk     Patient and/or Family Response: Patient reports that she feels that she is progressing well.  Patient reports that she is compliant with her medication and is intentionally doing things to  manage symptoms of depression, symptoms of stress and anxiety,  and engaging in positive social interaction.  Patient Centered Plan:  Patient is on the following Treatment Plan(s):  1. Recommend Lexapro  10 mg daily 2. Recommend Rozerem  8 mg daily at bedtime PRN sleep 3. BH specialist to follow up.  Clinical Assessment/Diagnosis Encounter Diagnosis  Name Primary?   Adjustment disorder with depressed mood Yes     Assessment: Patient currently experiencing improvements in irritability levels and mood.   Patient may benefit from ongoing IBH supports including counseling and medication management.  Plan: Follow up with behavioral health clinician on : 08/28/24 Behavioral recommendations:  Continue taking Lexapro  10 as prescribed. Pt can cut rozerem  8 in half if its triggering daytime sleepiness. Focus on behavioral activation recommendations Review Self Care recommendations Referral(s): Integrated Behavioral Health Services (In Clinic)  Pt reports that she wants to get the lexapro  but is not comfortable with the sleep medication at time of assessment. Discussed concerns w/ pt and provided pt with psychoeducational information about each medication.   Kajal Scalici R Rhaya Coale, LCSW

## 2024-10-02 ENCOUNTER — Ambulatory Visit: Admitting: Licensed Clinical Social Worker

## 2024-10-02 DIAGNOSIS — F4321 Adjustment disorder with depressed mood: Secondary | ICD-10-CM

## 2024-10-02 NOTE — BH Specialist Note (Addendum)
 Integrated Behavioral Health Follow Up In-Person Visit  10/02/2024  MRN: [id number] Name: Teresa Grant  Number of Integrated Behavioral Health Clinician visits: 6-Sixth Visit  Session Start time: 1030   Session End time: 1120  Total time in minutes: 50  Encounter Diagnosis  Name Primary?   Adjustment disorder with depressed mood Yes    Types of Service: Individual psychotherapy and Collaborative care   Subjective:Teresa Grant is a 64 y.o. female with history of depression seen in consultation at the request of Catheryn Slocumb, MD for establishment of [facility] management.   Pt is currently taking the following psychiatric medications: lexapro  10 mg and rozerem  8mg  (not taking due to the medication triggering excessive daytime sleepiness).  Current symptoms include:  decreased appetite, occasional insomnia, improving depression symptoms, situationally-triggered anxiety symptoms.  Pt denies SI, HI, or AVH at time of session. Pt denies substance use.   Duration of problem: ongoing; Severity of problem: moderate  Objective: Mood: Depressed and Affect: Depressed. Pt reporting improvement in overall mood since last session.  Risk of harm to self or others: No plan to harm self or others  Life Context: Patient reports positive social interaction with family and friends during holidays. Pt reports that she has some worries associated with her two sons--both of them have had relationship breakups and have moved back in with pt and her husband. Pt reports that this has caused some stress but pt doesn't mind because she likes knowing that her children are secure/stable.   Pt reports some concerns about her goddaughter and ongoing drama associated with her mother. Pt reports that the child is currently in foster care and that her bio mom continues to call pt and tell her you are not going to have this child again. Pt doesn't understand why the mother targets her like  this--all she wanted to do was to give this child a stable home.   Family and Social: Patient reports that she is getting along with everyone, feels more self regulated, and is finding joy in her positive social interactions with her immediate family, and positive interactions with her sisters and church family.  School/Work: N/A  Self-Care: Pt reports that she is going to try to focus on activities that make her feel good about herself. Pt is also working harder at setting limits and boundaries with others.   Life Changes: No major life changes since last session other than sons moving back in with patient and the information that her goddaughter's child is in foster care.   Patient and/or Family's Strengths/Protective Factors: Social and Emotional competence, Concrete supports in place (healthy food, safe environments, etc.), and Parental Resilience  Goals Addressed: Patient will:  Reduce symptoms of: depression, mood instability, and stress   Increase knowledge and/or ability of: coping skills, healthy habits, self-management skills, and stress reduction   Demonstrate ability to: Increase healthy adjustment to current life circumstances  Progress towards Goals: Ongoing  Interventions: Interventions utilized:  Medication Monitoring, Supportive Counseling, Sleep Hygiene, and Communication Skills Standardized Assessments completed: C-SSRS Short  Flowsheet Row Integrated Behavioral Health from 10/02/2024 in [facility] Integrated Behavioral Health from 08/28/2024 in [facility] Integrated Behavioral Health from 07/31/2024 in [facility]  C-SSRS RISK CATEGORY Moderate Risk Moderate Risk Moderate Risk     Patient and/or Family Response: Patient reports that she feels that she is progressing well.  Patient reports that she is compliant with her  medication and is intentionally doing things  to manage symptoms of depression, symptoms of stress and anxiety, and engaging in positive social interaction.  Patient Centered Plan:  Patient is on the following Treatment Plan(s):  1. Recommend Lexapro  10 mg daily 2. Recommend Rozerem  8 mg daily at bedtime PRN sleep 3. BH specialist to follow up.  Clinical Assessment/Diagnosis Encounter Diagnosis  Name Primary?   Adjustment disorder with depressed mood Yes      Assessment: Patient currently experiencing improvements in irritability levels and mood.   Patient may benefit from ongoing IBH supports including counseling and medication management.  Plan: Follow up with behavioral health clinician on : 08/28/24 Behavioral recommendations:  Continue taking Lexapro  10 as prescribed. Focus on behavioral activation recommendations Review Self Care recommendations Follow up w/ IBH sessions Referral(s): Integrated Behavioral Health Services (In Clinic)  Pt reports that she wants to get the lexapro  but is not comfortable with the sleep medication at time of assessment. Discussed concerns w/ pt and provided pt with psychoeducational information about each medication.   Michaiah Holsopple R Jaree Trinka, LCSW

## 2024-10-03 NOTE — Patient Instructions (Signed)
 Using Behavioral Activation to manage stress/depression symptoms     Identify/understand your own mood triggers.   Structure your day--get up around the same time, eat meals/snacks around the same time, go to bed around the same time.   Purposefully schedule self care time and time to complete tasks. This can include quiet time.  Stimulate your brain--go for a walk, text/call a friend or family member, if you are indoors--go outside (and vice versa), go for a drive, go to a store with bright colors and bright lights. Try to do things in a different way--drive to your favorite places using an alternative route, or instead of starting on the right side of the grocery store when shopping, start on the left side. You might feel a bit uncomfortable doing things outside of the comfort zone, but this is helping the brain create new neural pathways and is very healthy for brain/emotional health.   Physical movement based on your ability. If you can go for a walk, do stretches, even waving your hands to music can trigger feel-good endorphins in the brain and help release physical tension we all hold in our bodies.  Even 5 minutes can make a difference.   Be intentional about doing things that bring you joy (or used to bring you joy), and look for the things in every day that make you happy.  Seek those glimmers of joy each day.  Set a timer for 5 minutes for a harder task (ex. Laundry, washing dishes).  Allow yourself to work distraction-free for 5 minutes, then stop when the timer goes off. If you need a break, take a break. If you want to continue working then set another timer for whatever time you choose.   Limit or eliminate substance use including alcohol, marijuana, or recreational use of prescription medication.  Let in the light!! Open the window blinds, curtains and let natural light in. Even sitting near a window or sitting outside can boost your mood, especially in the wintertime when  there is less daylight.   If you take medication to manage symptoms, remember to take all medications as they are prescribed (please read all labels!!)    Things to envision for ourselves to to improve inspiration, motivation, and initiative :  improving physical wellness, focus on family relationships, focusing on our own mental/emotional well being, being a part of a bigger community, finding a hobby, being a part of something that fosters personal growth, engaging socially with others (even digitally!!!)

## 2024-10-10 ENCOUNTER — Encounter: Payer: Self-pay | Admitting: Internal Medicine

## 2024-10-10 ENCOUNTER — Other Ambulatory Visit: Payer: Self-pay | Admitting: Internal Medicine

## 2024-10-10 MED ORDER — TRESIBA FLEXTOUCH 200 UNIT/ML ~~LOC~~ SOPN: 12.0000 [IU] | PEN_INJECTOR | Freq: Every day | SUBCUTANEOUS | 4 refills | Status: DC

## 2024-10-10 NOTE — Telephone Encounter (Signed)
Dexcom uploaded.

## 2024-10-13 ENCOUNTER — Other Ambulatory Visit: Payer: Self-pay | Admitting: Internal Medicine

## 2024-10-15 ENCOUNTER — Encounter: Payer: Self-pay | Admitting: Internal Medicine

## 2024-10-15 ENCOUNTER — Ambulatory Visit: Admitting: Internal Medicine

## 2024-10-15 VITALS — BP 116/76 | Ht 61.0 in | Wt 162.0 lb

## 2024-10-15 DIAGNOSIS — Z7984 Long term (current) use of oral hypoglycemic drugs: Secondary | ICD-10-CM | POA: Diagnosis not present

## 2024-10-15 DIAGNOSIS — E785 Hyperlipidemia, unspecified: Secondary | ICD-10-CM

## 2024-10-15 DIAGNOSIS — E1142 Type 2 diabetes mellitus with diabetic polyneuropathy: Secondary | ICD-10-CM | POA: Diagnosis not present

## 2024-10-15 DIAGNOSIS — R809 Proteinuria, unspecified: Secondary | ICD-10-CM

## 2024-10-15 DIAGNOSIS — E1165 Type 2 diabetes mellitus with hyperglycemia: Secondary | ICD-10-CM

## 2024-10-15 DIAGNOSIS — E1129 Type 2 diabetes mellitus with other diabetic kidney complication: Secondary | ICD-10-CM

## 2024-10-15 DIAGNOSIS — Z794 Long term (current) use of insulin: Secondary | ICD-10-CM | POA: Diagnosis not present

## 2024-10-15 MED ORDER — TRESIBA FLEXTOUCH 200 UNIT/ML ~~LOC~~ SOPN: 8.0000 [IU] | PEN_INJECTOR | Freq: Every day | SUBCUTANEOUS | 4 refills | Status: AC

## 2024-10-15 NOTE — Progress Notes (Signed)
 " Name: Teresa Grant  MRN/ DOB: [id number], 03/02/1961   Age/ Sex: 64 y.o., female    PCP: Jarold Medici, MD   Reason for Endocrinology Evaluation: Type 2 Diabetes Mellitus     Date of Initial Endocrinology Visit: 06/15/2021    PATIENT IDENTIFIER: [name] is a 64 y.o. female with a past medical history of T2DM, HTN and Dyslipidemia . The patient presented for initial endocrinology clinic visit on 06/15/2021 for consultative assistance with her diabetes management.    HPI: [name] was    Diagnosed with DM 2008 Prior Medications tried/Intolerance: Glipizide  , januvia  Hemoglobin A1c has ranged from 6.8% in 2020, peaking at 11.2% in 2022.   On her initial visit to our clinic she had an A1c of 8.5%, we continued Xigduo , increase Ozempic , and decrease basal insulin    Stopped Ozempic  07/2022 due to hx of pancreatitis and chronic abdominal pain, presenting to ED 06/2022 with N/V and slight elevation in lipase  Started glimepiride  01/2023 due to increased A1c from 6.4% to 11.4%   She met with Rock Dasen 08/2023  Switch glimepiride  to glipizide  October, 2025 with an A1c of 8.4%  Decrease Xigduo  by 50% due to a GFR of 35 in October, 2025  SUBJECTIVE:   During the last visit (07/15/2024): A1c was 8.4 %   Today (10/15/24): [name] is here for follow-up on diabetes management.  She checks glucose multiple times daily through CGM, patient has contacted our office last week with hypoglycemic episodes, for which we have decreased her basal insulin , but the patient continues with hypoglycemia overnight.  She is not symptomatic  She continues with occasional abdominal pain  She has been constipated No vomiting but has noted nausea     HOME DIABETES REGIMEN: Glipizide  5 mg, twice daily Xigduo  01-999 mg , 1 tab daily  Tresiba  12 units daily  Rosuvastatin  40 mg daily Telmisartan  40 mg daily    Statin: yes ACE-I/ARB: Intolerant to Lisinopril due to  cough     CONTINUOUS GLUCOSE MONITORING RECORD INTERPRETATION                   Of the KPS  DIABETIC COMPLICATIONS: Microvascular complications:   Denies: CKD, retinopathy, neuropathy  Last eye exam: Completed 01/24/2023  Macrovascular complications:   Denies: CAD, PVD, CVA   PAST HISTORY: Past Medical History:  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   Diabetes mellitus    Dysrhythmia    1985   Heart murmur    HLD (hyperlipidemia)    Hypertension    Kidney stone on right side 03/2013   Pneumonia    7 yrs ago   Past Surgical History:  Past Surgical History:  Procedure Laterality Date   ABDOMINAL HYSTERECTOMY  2001   BACK SURGERY     CERVICAL SPINE SURGERY  06/06/2019   Dr. Gillie, performed at surgical center   CESAREAN SECTION  1990   COLONOSCOPY     Dr. Debrah normal   FOOT SURGERY Bilateral    LEFT HEART CATH AND CORONARY ANGIOGRAPHY N/A 11/03/2021   Procedure: LEFT HEART CATH AND CORONARY ANGIOGRAPHY;  Surgeon: Mady Bruckner, MD;  Location: [facility];  Service: Cardiovascular;  Laterality: N/A;   LUMBAR LAMINECTOMY/DECOMPRESSION MICRODISCECTOMY  03/20/2012   Procedure: LUMBAR LAMINECTOMY/DECOMPRESSION MICRODISCECTOMY;  Surgeon: Oneil Rodgers Priestly, MD;  Location: [facility];  Service: Orthopedics;  Laterality: Left;  Left sided lumbar 4-5 microdisectomy   SPINE  SURGERY  2013    Social History:  reports that she quit smoking about 20 years ago. Her smoking use included cigarettes. She started smoking about 45 years ago. She has a 12.5 pack-year smoking history. She has never used smokeless tobacco. She reports that she does not currently use alcohol. She reports that she does not use drugs. Family History:  Family History  Problem Relation Age of Onset   Hypertension Mother    Stroke Mother    Heart attack Mother 28   Heart disease Mother    Hypertension Father    Kidney disease Father    Hypertension  Sister    Colon polyps Sister    Hypertension Brother    Hypertension Maternal Aunt    Hypertension Maternal Uncle    Esophageal cancer Maternal Uncle    Diabetes Paternal Aunt    Diabetes Paternal Uncle    Colon cancer Cousin    Cancer Neg Hx    Rectal cancer Neg Hx    Stomach cancer Neg Hx    Breast cancer Neg Hx      HOME MEDICATIONS: Allergies as of 10/15/2024       Reactions   Lisinopril Cough   Atorvastatin  Other (See Comments)   Hydrochlorothiazide Other (See Comments)   pancreatitis        Medication List        Accurate as of October 15, 2024  8:03 AM. If you have any questions, ask your nurse or doctor.          amLODipine  10 MG tablet Commonly known as: NORVASC  Take 1 tablet (10 mg total) by mouth daily.   Aspirin  Low Dose 81 MG tablet Generic drug: aspirin  EC TAKE 4 TABLETS BY MOUTH DAILY - SWALLOW WHOLE   Citrucel oral powder Generic drug: methylcellulose Use as directed, daily   cyclobenzaprine  5 MG tablet Commonly known as: FLEXERIL  One tab po at bedtime prn   Dapagliflozin  Pro-metFORMIN  ER 01-999 MG Tb24 Commonly known as: Xigduo  XR Take 1 tablet by mouth daily in the afternoon.   Dexcom G7 Sensor Misc APPLY SENSOR TO SKIN FOR CONTINUOUS BLOOD SUGAR MONITORING, REPLACE EVERY 10 DAYS   escitalopram  10 MG tablet Commonly known as: LEXAPRO  TAKE 1 TABLET BY MOUTH DAILY   glipiZIDE  5 MG tablet Commonly known as: GLUCOTROL  Take 1 tablet (5 mg total) by mouth 2 (two) times daily before a meal.   IBgard 90 MG Cpcr Generic drug: Peppermint Oil Take as directed   ibuprofen  800 MG tablet Commonly known as: ADVIL  Take 1 tablet (800 mg total) by mouth 3 (three) times daily.   Insulin  Pen Needle 32G X 4 MM Misc 1 Device by Does not apply route daily in the afternoon.   nitroGLYCERIN  0.4 MG SL tablet Commonly known as: NITROSTAT  Place 1 tablet (0.4 mg total) under the tongue every 5 (five) minutes as needed for chest pain. UP TO 3  TABLETS   OneTouch Verio test strip Generic drug: glucose blood Use as directed to check blood sugars 2 times per day dx: e11.65   ramelteon  8 MG tablet Commonly known as: ROZEREM  Take 1 tablet (8 mg total) by mouth at bedtime as needed for sleep.   rosuvastatin  40 MG tablet Commonly known as: CRESTOR  Take 1 tablet (40 mg total) by mouth daily.   telmisartan  40 MG tablet Commonly known as: Micardis  Take 1 tablet (40 mg total) by mouth daily.   Tresiba  FlexTouch 200 UNIT/ML FlexTouch Pen Generic drug: insulin  degludec  Inject 12 Units into the skin at bedtime.   VITAMIN C  PO Take 500 mg by mouth every evening.   Vitamin D  (Ergocalciferol ) 1.25 MG (50000 UNIT) Caps capsule Commonly known as: DRISDOL  TAKE 1 CAPSULE BY MOUTH ONCE WEEKLY   ZINCATE PO Take 50 mg by mouth every evening.         ALLERGIES: Allergies  Allergen Reactions   Lisinopril Cough   Atorvastatin  Other (See Comments)   Hydrochlorothiazide Other (See Comments)    pancreatitis     REVIEW OF SYSTEMS: A comprehensive ROS was conducted with the patient and is negative except as per HPI     OBJECTIVE:   VITAL SIGNS:BP 116/76   Ht 5' 1 (1.549 m)   Wt 162 lb (73.5 kg)   BMI 30.61 kg/m    PHYSICAL EXAM:  General: Pt appears well and is in NAD  Lungs: Clear with good BS bilat   Heart: RRR   Extremities:  Lower extremities - No pretibial edema  Neuro: MS is good with appropriate affect, pt is alert and Ox3    DM foot exam: 10/15/2024  The skin of the feet is intact without sores or ulcerations. The pedal pulses are 2+ on right and 2+ on left. The sensation is intact to a screening 5.07, 10 gram monofilament bilaterally     DATA REVIEWED:  Lab Results  Component Value Date   HGBA1C 8.7 (H) 08/20/2024   HGBA1C 8.4 (H) 06/25/2024   HGBA1C 8.5 (A) 04/14/2024    Latest Reference Range & Units 07/29/24 08:53  Sodium 134 - 144 mmol/L 144  Potassium 3.5 - 5.2 mmol/L 4.8  Chloride 96  - 106 mmol/L 106  CO2 20 - 29 mmol/L 22  Glucose 70 - 99 mg/dL 822 (H)  BUN 8 - 27 mg/dL 24  Creatinine 9.42 - 8.99 mg/dL 8.27 (H)  Calcium  8.7 - 10.3 mg/dL 89.6  BUN/Creatinine Ratio 12 - 28  14  eGFR >59 mL/min/1.73 33 (L)  Alkaline Phosphatase 49 - 135 IU/L 162 (H)  Albumin 3.9 - 4.9 g/dL 4.3  AST 0 - 40 IU/L 16  ALT 0 - 32 IU/L 14  Total Protein 6.0 - 8.5 g/dL 7.4  Total Bilirubin 0.0 - 1.2 mg/dL 0.4    Latest Reference Range & Units 06/25/24 12:32  Total CHOL/HDL Ratio 0.0 - 4.4 ratio 2.5  Cholesterol, Total 100 - 199 mg/dL 99 (L)  HDL Cholesterol >39 mg/dL 39 (L)  Triglycerides 0 - 149 mg/dL 71  VLDL Cholesterol Cal 5 - 40 mg/dL 15  LDL Chol Calc (NIH) 0 - 99 mg/dL 45  (L): Data is abnormally low    ASSESSMENT / PLAN / RECOMMENDATIONS:   1) Type 2 Diabetes Mellitus, poorly controlled, With Neuropathic and complications and microabuminuria - Most recent A1c of 8.7%. Goal A1c < 7.0 %.    - She had an A1c less than 2 months ago at 8.7%, in reviewing her CGM, GMI 6.5% over the past 2 weeks -I stopped Ozempic  due to history of pancreatitis and chronic abdominal pain - Patient with a GFR of 35, we decreased Xigduo  to 1 tablet daily - I switch glimepiride  to glipizide  due to hyperglycemia with maximum dose of glimepiride  in October, 2025 - Patient continues with hypoglycemia overnight, I will decrease basal insulin  as below - I did encourage the patient to avoid sugar sweetened beverages and to limit CHO intake with each meal  MEDICATIONS:  continue glipizide  5 mg, 1 tablet before breakfast  and 1 tablet before supper Continue Xigduo  01-999 mg,1  tablet daily Decrease Tresiba  to 8 units daily   EDUCATION / INSTRUCTIONS: BG monitoring instructions: Patient is instructed to check her blood sugars 1 times a day, fasting . Call [facility] if: BG persistently < 70  I reviewed the Rule of 15 for the treatment of hypoglycemia in detail with the patient.  Literature supplied.   2) Diabetic complications:  Eye: Does not have known diabetic retinopathy.  Neuro/ Feet: Does  have known diabetic peripheral neuropathy based on exam 9/21/202 Renal: Patient does not have known baseline CKD. She is  on an ACEI/ARB at present.  3) Dyslipidemia:    - LDL was above goal at 100 mg/dL in 7978, We switched pravastatin  to  Atorvastatin ,   by March, 2025 switched from atorvastatin  to rosuvastatin  due to LDL continuing to be above goal -  Medication  Continue rosuvastatin  40 mg daily   4) Microalbuminuria:  -Emphasized the importance of optimizing glucose control and compliance with ARB intake -I increase telmisartan  from 20 mg to 40 mg by March, 2025 due to worsening MA/CR ratio   Medication Continue telmisartan  40 mg    F/U in 3 months    Signed electronically by: Stefano Redgie Butts, MD  [facility]  [facility] [address] [place], [place] [address] Phone: [phone] FAX: [phone]   CC: Jarold Medici, MD [address] [place] [address] [place] [address] Phone: [phone]  Fax: [phone]    Return to Endocrinology clinic as below: Future Appointments  Date Time Provider Department Center  10/30/2024  9:00 AM Juan Tawni JONELLE HUGHS [facility] [address]  12/25/2024 11:20 AM Jarold Medici, MD [facility] [phone]    "

## 2024-10-15 NOTE — Patient Instructions (Signed)
 Continue Glipizide  5 mg, 1 tablet before breakfast and 1 tablet before supper Xigduo  01-999 mg, 1 tablet daily Decrease Tresiba  8 units daily     HOW TO TREAT LOW BLOOD SUGARS (Blood sugar LESS THAN 70 MG/DL) Please follow the RULE OF 15 for the treatment of hypoglycemia treatment (when your (blood sugars are less than 70 mg/dL)   STEP 1: Take 15 grams of carbohydrates when your blood sugar is low, which includes:  3-4 GLUCOSE TABS  OR 3-4 OZ OF JUICE OR REGULAR SODA OR ONE TUBE OF GLUCOSE GEL    STEP 2: RECHECK blood sugar in 15 MINUTES STEP 3: If your blood sugar is still low at the 15 minute recheck --> then, go back to STEP 1 and treat AGAIN with another 15 grams of carbohydrates.

## 2024-10-17 ENCOUNTER — Other Ambulatory Visit (HOSPITAL_COMMUNITY): Payer: Self-pay

## 2024-10-17 ENCOUNTER — Telehealth: Payer: Self-pay | Admitting: Pharmacy Technician

## 2024-10-17 NOTE — Telephone Encounter (Signed)
 Pharmacy Patient Advocate Encounter   Received notification from [facility] that prior authorization for Dexcom G7 Sensor  is required/requested.   Insurance verification completed.   The patient is insured through [facility].   Per test claim: PA required; PA submitted to above mentioned insurance via Latent Key/confirmation #/EOC [id number] Status is pending

## 2024-10-20 ENCOUNTER — Other Ambulatory Visit (HOSPITAL_COMMUNITY): Payer: Self-pay

## 2024-10-20 NOTE — Telephone Encounter (Signed)
 Pharmacy Patient Advocate Encounter  Received notification from [facility] that Prior Authorization for Dexcom G7 Sensor  has been APPROVED from 10/17/24 to 10/17/25. Ran test claim, Copay is $75.00. This test claim was processed through [facility]- copay amounts may vary at other pharmacies due to pharmacy/plan contracts, or as the patient moves through the different stages of their insurance plan.   PA #/Case ID/Reference #: [id number]

## 2024-10-30 ENCOUNTER — Ambulatory Visit: Payer: Self-pay | Admitting: Licensed Clinical Social Worker

## 2024-10-30 DIAGNOSIS — F330 Major depressive disorder, recurrent, mild: Secondary | ICD-10-CM

## 2024-10-30 NOTE — BH Specialist Note (Signed)
 Integrated Behavioral Health Follow Up In-Person Visit 10/30/24   MRN: [id number] Name: Teresa Grant  Number of Integrated Behavioral Health Clinician visits: Additional Visit  Session Start time: 0900   Session End time: 0930  Total time in minutes: 30  Encounter Diagnosis  Name Primary?   MDD (major depressive disorder), recurrent episode, mild Yes     Types of Service: Individual psychotherapy and Collaborative care   Subjective:Teresa Grant is a 64 y.o. female with history of depression seen in consultation at the request of Catheryn Slocumb, MD for establishment of [facility] management.   Pt is currently taking the following psychiatric medications: lexapro  10 mg and rozerem  8mg  (not taking due to the medication triggering excessive daytime sleepiness).  Current symptoms include:  decreased appetite, occasional insomnia, improving depression symptoms, situationally-triggered anxiety symptoms.  Pt denies SI, HI, or AVH at time of session. Pt denies substance use.   Duration of problem: ongoing; Severity of problem: moderate  Objective: Mood: Depressed and Affect: Depressed. Pt reporting improvement in overall mood since last session.  Risk of harm to self or others: No plan to harm self or others  Family and Social: Patient reports ongoing conflict regarding her goddaughter's child, who is still in foster care.  Patient reports that she has anxiety associated with not knowing where this child is, and wanting to be there for the child.  Patient reports that she continues to help out the child's mother, but she is working harder about setting limits and setting boundaries.  School/Work: N/A  Self-Care: Pt reports that she is going to try to focus on activities that make her feel good about herself.   Life Changes: Patient reports 1 significant external stressor is her husband continuing to gamble.  Patient feels that the loss of income is impacting ability  to pay bills in a significant way.  Patient reports that her husband is often spending his money, and then asking her to borrow money.  Assisted patient with identifying ways that she could set limits and boundaries to protect her financial assets.  Patient stated that she has her own account, which only allows her access to the funds.  Patient and/or Family's Strengths/Protective Factors: Social and Emotional competence, Concrete supports in place (healthy food, safe environments, etc.), and Parental Resilience  Goals Addressed: Patient will:  Reduce symptoms of: depression, mood instability, and stress   Increase knowledge and/or ability of: coping skills, healthy habits, self-management skills, and stress reduction   Demonstrate ability to: Increase healthy adjustment to current life circumstances  Progress towards Goals: Ongoing  Interventions: Interventions utilized:  Medication Monitoring, Supportive Counseling, Sleep Hygiene, and Communication Skills Standardized Assessments completed: C-SSRS Short  Flowsheet Row Integrated Behavioral Health from 10/30/2024 in [facility] Integrated [facility]al Health from 10/02/2024 in [facility] Health from 08/28/2024 in [facility]  C-SSRS RISK CATEGORY Moderate Risk Moderate Risk Moderate Risk     Patient and/or Family Response: Patient reports that she feels that she is progressing well.  Patient reports that she is compliant with her medication and is intentionally doing things to manage symptoms of depression, symptoms of stress and anxiety, and engaging in positive social interaction.  Patient Centered Plan:  Patient is on the following Treatment Plan(s):  1. Recommend Lexapro  10 mg daily 2. Recommend Rozerem  8 mg daily at bedtime PRN sleep 3. BH specialist to follow up.  Patient reports that  she is compliant with  Lexapro  and Rozerem .  Clinical Assessment/Diagnosis Encounter Diagnosis  Name Primary?   MDD (major depressive disorder), recurrent episode, mild Yes      10/30/2024   10:37 AM 10/02/2024   11:11 AM 08/28/2024   11:01 AM  Depression screen PHQ 2/9  Decreased Interest 1 1 0  Down, Depressed, Hopeless 1 1 1   PHQ - 2 Score 2 2 1   Altered sleeping 2 1 2   Tired, decreased energy 1 2 1   Change in appetite 3 2 0  Feeling bad or failure about yourself  2 1 0  Trouble concentrating 0  0  Moving slowly or fidgety/restless 0 1 1  Suicidal thoughts 0 0 0  PHQ-9 Score 10 9 5   Difficult doing work/chores   Not difficult at all      10/30/2024   10:37 AM 08/28/2024   11:02 AM 07/03/2024    3:19 PM 12/25/2023    2:08 PM  GAD 7 : Generalized Anxiety Score  Nervous, Anxious, on Edge 0 2  1  0   Control/stop worrying 3 1  3   0   Worry too much - different things 3 1  3   0   Trouble relaxing 1 0  3  0   Restless 1 0  0  0   Easily annoyed or irritable 1 0  3  0   Afraid - awful might happen 2 1  3   0   Total GAD 7 Score 11 5 16  0  Anxiety Difficulty  Not difficult at all Somewhat difficult Not difficult at all     Data saved with a previous flowsheet row definition    Flowsheet Row Integrated Behavioral Health from 10/30/2024 in [facility] Integrated [facility]al Health from 10/02/2024 in [facility]al Health from 08/28/2024 in [facility]  C-SSRS RISK CATEGORY Moderate Risk Moderate Risk Moderate Risk    Assessment: IBH clinician reviewed Teresa Grant 's PHQ-9, GAD-7, and CSSRS scores. Scores reflect ongoing symptoms related to depression and anxiety.   IBH clinician explored current external stressors that pt is experiencing and reviewed coping skills that patient is utilizing and provided encouragement and recommendations of continued symptom/behavior management.    IBH clinician provided Teresa Grant with psychoeducational materials in after visit summary, and instructed pt to review materials through their confidential MyChart portal.   IBH clinician encouraging continued [facility] Collaborative Care Team supports including counseling and medication management as needed.  t.  Plan: Follow up with behavioral health clinician on : 11/27/2024 Behavioral recommendations:  Continue taking Lexapro  10 as prescribed. Focus on behavioral activation recommendations Review Self Care recommendations Follow up w/ IBH sessions Referral(s): Integrated Behavioral Health Services (In Clinic)  Pt reports that she wants to get the lexapro  but is not comfortable with the sleep medication at time of assessment. Discussed concerns w/ pt and provided pt with psychoeducational information about each medication.   Shaheed Schmuck R Alexx Giambra, LCSW

## 2024-10-30 NOTE — Patient Instructions (Signed)
??   Self-Care Worksheet  Always prioritize medication compliance--if you are prescribed medications, please take medication as prescribed.   ?? Mental & Emotional Self-Care Practice mindfulness or meditation for 5-10 minutes daily Journal your thoughts or feelings Set boundaries to protect your emotional energy Talk to a therapist or counselor Engage in positive self-talk  ?? Physical Self-Care Get 7-9 hours of sleep each night Move your body (walk, stretch, dance, exercise) Eat nourishing meals and stay hydrated Take breaks to rest and recharge Schedule regular health check-ups  ?? Creative Self-Care Try a new hobby or craft Listen to or play music Draw, paint, or color Write poetry or stories Bluford or bake something new  ?? Social Self-Care Spend time with supportive friends or family Join a group or community with shared interests Say no when you need to Reach out to someone you trust Plan a fun outing or virtual hangout  ?? Spiritual Self-Care Reflect on your values and beliefs Spend time in nature Read spiritual or inspirational texts Practice gratitude daily Attend a place of worship or spiritual gathering  ?? Environmental Self-Care Adult Nurse a calming corner or sanctuary Use scents, lighting, or music to set a mood Organize your schedule or workspace Reduce digital clutter

## 2024-11-27 ENCOUNTER — Ambulatory Visit: Payer: Self-pay | Admitting: Licensed Clinical Social Worker

## 2024-12-25 ENCOUNTER — Encounter: Payer: Self-pay | Admitting: Internal Medicine

## 2025-01-14 ENCOUNTER — Ambulatory Visit: Admitting: Internal Medicine
